# Patient Record
Sex: Male | Born: 1940 | Race: White | Hispanic: No | Marital: Married | Smoking: Current every day smoker
Health system: Southern US, Community
[De-identification: ages and names within clinical notes are randomized; demographics above are authoritative.]

## PROBLEM LIST (undated history)

## (undated) DIAGNOSIS — R0989 Other specified symptoms and signs involving the circulatory and respiratory systems: Secondary | ICD-10-CM

## (undated) DIAGNOSIS — R9430 Abnormal result of cardiovascular function study, unspecified: Secondary | ICD-10-CM

## (undated) DIAGNOSIS — Z951 Presence of aortocoronary bypass graft: Secondary | ICD-10-CM

## (undated) DIAGNOSIS — M5136 Other intervertebral disc degeneration, lumbar region: Secondary | ICD-10-CM

## (undated) DIAGNOSIS — M549 Dorsalgia, unspecified: Secondary | ICD-10-CM

## (undated) DIAGNOSIS — D696 Thrombocytopenia, unspecified: Secondary | ICD-10-CM

## (undated) DIAGNOSIS — I10 Essential (primary) hypertension: Secondary | ICD-10-CM

## (undated) DIAGNOSIS — M48061 Spinal stenosis, lumbar region without neurogenic claudication: Secondary | ICD-10-CM

## (undated) DIAGNOSIS — D472 Monoclonal gammopathy: Secondary | ICD-10-CM

## (undated) DIAGNOSIS — H269 Unspecified cataract: Secondary | ICD-10-CM

## (undated) DIAGNOSIS — I452 Bifascicular block: Secondary | ICD-10-CM

## (undated) DIAGNOSIS — I2510 Atherosclerotic heart disease of native coronary artery without angina pectoris: Secondary | ICD-10-CM

## (undated) DIAGNOSIS — I714 Abdominal aortic aneurysm, without rupture, unspecified: Secondary | ICD-10-CM

## (undated) DIAGNOSIS — K219 Gastro-esophageal reflux disease without esophagitis: Secondary | ICD-10-CM

## (undated) DIAGNOSIS — E785 Hyperlipidemia, unspecified: Secondary | ICD-10-CM

## (undated) DIAGNOSIS — Z87442 Personal history of urinary calculi: Secondary | ICD-10-CM

## (undated) DIAGNOSIS — I779 Disorder of arteries and arterioles, unspecified: Secondary | ICD-10-CM

## (undated) DIAGNOSIS — M51369 Other intervertebral disc degeneration, lumbar region without mention of lumbar back pain or lower extremity pain: Secondary | ICD-10-CM

## (undated) DIAGNOSIS — I219 Acute myocardial infarction, unspecified: Secondary | ICD-10-CM

## (undated) DIAGNOSIS — I739 Peripheral vascular disease, unspecified: Secondary | ICD-10-CM

## (undated) HISTORY — DX: Dorsalgia, unspecified: M54.9

## (undated) HISTORY — DX: Abdominal aortic aneurysm, without rupture: I71.4

## (undated) HISTORY — DX: Atherosclerotic heart disease of native coronary artery without angina pectoris: I25.10

## (undated) HISTORY — DX: Acute myocardial infarction, unspecified: I21.9

## (undated) HISTORY — PX: TONSILLECTOMY: SUR1361

## (undated) HISTORY — DX: Abdominal aortic aneurysm, without rupture, unspecified: I71.40

## (undated) HISTORY — PX: BACK SURGERY: SHX140

## (undated) HISTORY — PX: JOINT REPLACEMENT: SHX530

## (undated) HISTORY — DX: Unspecified cataract: H26.9

## (undated) HISTORY — DX: Thrombocytopenia, unspecified: D69.6

## (undated) HISTORY — DX: Essential (primary) hypertension: I10

## (undated) HISTORY — DX: Other intervertebral disc degeneration, lumbar region: M51.36

## (undated) HISTORY — DX: Bifascicular block: I45.2

## (undated) HISTORY — DX: Peripheral vascular disease, unspecified: I73.9

## (undated) HISTORY — DX: Other intervertebral disc degeneration, lumbar region without mention of lumbar back pain or lower extremity pain: M51.369

## (undated) HISTORY — DX: Presence of aortocoronary bypass graft: Z95.1

## (undated) HISTORY — DX: Other specified symptoms and signs involving the circulatory and respiratory systems: R09.89

## (undated) HISTORY — PX: COLONOSCOPY: SHX174

## (undated) HISTORY — DX: Disorder of arteries and arterioles, unspecified: I77.9

## (undated) HISTORY — DX: Hyperlipidemia, unspecified: E78.5

## (undated) HISTORY — DX: Abnormal result of cardiovascular function study, unspecified: R94.30

---

## 2001-07-19 ENCOUNTER — Encounter: Payer: Self-pay | Admitting: Orthopedic Surgery

## 2001-07-20 ENCOUNTER — Encounter: Payer: Self-pay | Admitting: Orthopedic Surgery

## 2001-07-20 ENCOUNTER — Ambulatory Visit (HOSPITAL_COMMUNITY): Admission: RE | Admit: 2001-07-20 | Discharge: 2001-07-20 | Payer: Self-pay | Admitting: Orthopedic Surgery

## 2002-12-08 HISTORY — PX: CORONARY ARTERY BYPASS GRAFT: SHX141

## 2003-04-04 ENCOUNTER — Encounter: Payer: Self-pay | Admitting: Cardiology

## 2003-04-05 ENCOUNTER — Inpatient Hospital Stay (HOSPITAL_COMMUNITY): Admission: AD | Admit: 2003-04-05 | Discharge: 2003-04-15 | Payer: Self-pay | Admitting: Cardiology

## 2003-04-10 ENCOUNTER — Encounter: Payer: Self-pay | Admitting: Cardiothoracic Surgery

## 2003-04-11 ENCOUNTER — Encounter: Payer: Self-pay | Admitting: Cardiothoracic Surgery

## 2003-04-12 ENCOUNTER — Encounter: Payer: Self-pay | Admitting: Cardiothoracic Surgery

## 2003-04-13 ENCOUNTER — Encounter: Payer: Self-pay | Admitting: Cardiothoracic Surgery

## 2003-10-09 HISTORY — PX: TOTAL HIP ARTHROPLASTY: SHX124

## 2003-10-16 ENCOUNTER — Inpatient Hospital Stay (HOSPITAL_COMMUNITY): Admission: RE | Admit: 2003-10-16 | Discharge: 2003-10-24 | Payer: Self-pay | Admitting: Orthopedic Surgery

## 2003-10-18 ENCOUNTER — Encounter: Payer: Self-pay | Admitting: Cardiology

## 2004-11-28 ENCOUNTER — Ambulatory Visit: Payer: Self-pay | Admitting: Cardiology

## 2005-03-06 ENCOUNTER — Ambulatory Visit: Payer: Self-pay | Admitting: Oncology

## 2005-03-10 ENCOUNTER — Ambulatory Visit (HOSPITAL_COMMUNITY): Admission: RE | Admit: 2005-03-10 | Discharge: 2005-03-10 | Payer: Self-pay | Admitting: Oncology

## 2005-06-30 ENCOUNTER — Ambulatory Visit: Payer: Self-pay | Admitting: Cardiology

## 2005-07-01 ENCOUNTER — Ambulatory Visit: Payer: Self-pay | Admitting: Cardiology

## 2005-07-02 ENCOUNTER — Ambulatory Visit: Payer: Self-pay | Admitting: Cardiology

## 2005-09-04 ENCOUNTER — Ambulatory Visit: Payer: Self-pay | Admitting: Oncology

## 2006-03-16 ENCOUNTER — Ambulatory Visit: Payer: Self-pay | Admitting: Oncology

## 2006-03-16 LAB — CBC WITH DIFFERENTIAL/PLATELET
Basophils Absolute: 0 10*3/uL (ref 0.0–0.1)
EOS%: 5.8 % (ref 0.0–7.0)
Eosinophils Absolute: 0.5 10*3/uL (ref 0.0–0.5)
HGB: 15 g/dL (ref 13.0–17.1)
MCH: 30.2 pg (ref 28.0–33.4)
MCV: 90.1 fL (ref 81.6–98.0)
MONO%: 5.1 % (ref 0.0–13.0)
NEUT#: 6.3 10*3/uL (ref 1.5–6.5)
RBC: 4.96 10*6/uL (ref 4.20–5.71)
RDW: 13.9 % (ref 11.2–14.6)
lymph#: 1.6 10*3/uL (ref 0.9–3.3)

## 2006-03-16 LAB — COMPREHENSIVE METABOLIC PANEL
ALT: 14 U/L (ref 0–40)
AST: 16 U/L (ref 0–37)
Albumin: 4.5 g/dL (ref 3.5–5.2)
Alkaline Phosphatase: 81 U/L (ref 39–117)
Calcium: 9.6 mg/dL (ref 8.4–10.5)
Chloride: 109 mEq/L (ref 96–112)
Potassium: 5 mEq/L (ref 3.5–5.3)
Sodium: 144 mEq/L (ref 135–145)
Total Protein: 7 g/dL (ref 6.0–8.3)

## 2006-04-22 ENCOUNTER — Ambulatory Visit: Payer: Self-pay | Admitting: Cardiology

## 2006-06-03 ENCOUNTER — Ambulatory Visit: Payer: Self-pay

## 2006-06-19 ENCOUNTER — Ambulatory Visit: Payer: Self-pay | Admitting: Cardiology

## 2006-06-26 ENCOUNTER — Ambulatory Visit: Payer: Self-pay | Admitting: Cardiology

## 2006-08-03 ENCOUNTER — Ambulatory Visit: Payer: Self-pay | Admitting: Cardiology

## 2006-09-15 ENCOUNTER — Ambulatory Visit: Payer: Self-pay | Admitting: Oncology

## 2006-12-14 ENCOUNTER — Ambulatory Visit: Payer: Self-pay | Admitting: Cardiology

## 2007-07-16 ENCOUNTER — Ambulatory Visit: Payer: Self-pay

## 2007-08-04 ENCOUNTER — Ambulatory Visit: Payer: Self-pay | Admitting: Cardiology

## 2007-08-05 ENCOUNTER — Ambulatory Visit: Payer: Self-pay | Admitting: Cardiology

## 2007-08-05 LAB — CONVERTED CEMR LAB
BUN: 13 mg/dL (ref 6–23)
Basophils Absolute: 0.1 10*3/uL (ref 0.0–0.1)
Basophils Relative: 0.7 % (ref 0.0–1.0)
CO2: 30 meq/L (ref 19–32)
Calcium: 9.4 mg/dL (ref 8.4–10.5)
Chloride: 112 meq/L (ref 96–112)
Cholesterol: 109 mg/dL (ref 0–200)
Creatinine, Ser: 0.8 mg/dL (ref 0.4–1.5)
Eosinophils Absolute: 0.9 10*3/uL — ABNORMAL HIGH (ref 0.0–0.6)
Eosinophils Relative: 9.6 % — ABNORMAL HIGH (ref 0.0–5.0)
GFR calc Af Amer: 124 mL/min
GFR calc non Af Amer: 102 mL/min
Glucose, Bld: 112 mg/dL — ABNORMAL HIGH (ref 70–99)
HCT: 42.2 % (ref 39.0–52.0)
HDL: 38.3 mg/dL — ABNORMAL LOW (ref >39.0)
Hemoglobin: 14.4 g/dL (ref 13.0–17.0)
LDL Cholesterol: 60 mg/dL (ref 0–99)
Lymphocytes Relative: 19.3 % (ref 12.0–46.0)
MCHC: 34.1 g/dL (ref 30.0–36.0)
MCV: 90.9 fL (ref 78.0–100.0)
Monocytes Absolute: 0.6 10*3/uL (ref 0.2–0.7)
Monocytes Relative: 6.2 % (ref 3.0–11.0)
Neutro Abs: 5.7 10*3/uL (ref 1.4–7.7)
Neutrophils Relative %: 64.2 % (ref 43.0–77.0)
Platelets: 67 10*3/uL — ABNORMAL LOW (ref 150–400)
Potassium: 5.1 meq/L (ref 3.5–5.1)
RBC: 4.64 M/uL (ref 4.22–5.81)
RDW: 12.7 % (ref 11.5–14.6)
Sodium: 146 meq/L — ABNORMAL HIGH (ref 135–145)
Total CHOL/HDL Ratio: 2.8
Triglycerides: 51 mg/dL (ref 0–149)
VLDL: 10 mg/dL (ref 0–40)
WBC: 9.1 10*3/uL (ref 4.5–10.5)

## 2007-08-27 ENCOUNTER — Ambulatory Visit: Payer: Self-pay | Admitting: Cardiology

## 2007-08-27 LAB — CONVERTED CEMR LAB
Basophils Absolute: 0.1 10*3/uL (ref 0.0–0.1)
Basophils Relative: 0.7 % (ref 0.0–1.0)
Eosinophils Absolute: 0.8 10*3/uL — ABNORMAL HIGH (ref 0.0–0.6)
Eosinophils Relative: 9.3 % — ABNORMAL HIGH (ref 0.0–5.0)
HCT: 42.7 % (ref 39.0–52.0)
Hemoglobin: 14.5 g/dL (ref 13.0–17.0)
Lymphocytes Relative: 23.4 % (ref 12.0–46.0)
MCHC: 34 g/dL (ref 30.0–36.0)
MCV: 91.8 fL (ref 78.0–100.0)
Monocytes Absolute: 0.8 10*3/uL — ABNORMAL HIGH (ref 0.2–0.7)
Monocytes Relative: 9.3 % (ref 3.0–11.0)
Neutro Abs: 4.8 10*3/uL (ref 1.4–7.7)
Neutrophils Relative %: 57.3 % (ref 43.0–77.0)
Platelets: 56 10*3/uL — ABNORMAL LOW (ref 150–400)
RBC: 4.65 M/uL (ref 4.22–5.81)
RDW: 12.8 % (ref 11.5–14.6)
WBC: 8.5 10*3/uL (ref 4.5–10.5)

## 2007-09-01 ENCOUNTER — Ambulatory Visit: Payer: Self-pay | Admitting: Oncology

## 2007-09-03 LAB — COMPREHENSIVE METABOLIC PANEL
AST: 13 U/L (ref 0–37)
BUN: 18 mg/dL (ref 6–23)
Calcium: 9 mg/dL (ref 8.4–10.5)
Chloride: 107 mEq/L (ref 96–112)
Creatinine, Ser: 0.83 mg/dL (ref 0.40–1.50)

## 2007-09-03 LAB — CBC WITH DIFFERENTIAL/PLATELET
Basophils Absolute: 0 10*3/uL (ref 0.0–0.1)
EOS%: 7.6 % — ABNORMAL HIGH (ref 0.0–7.0)
HCT: 41.9 % (ref 38.7–49.9)
HGB: 14.7 g/dL (ref 13.0–17.1)
MCH: 31.4 pg (ref 28.0–33.4)
MCV: 89.2 fL (ref 81.6–98.0)
MONO%: 5.6 % (ref 0.0–13.0)
NEUT%: 66.8 % (ref 40.0–75.0)
lymph#: 1.8 10*3/uL (ref 0.9–3.3)

## 2007-09-07 LAB — IMMUNOFIXATION ELECTROPHORESIS
IgA: 113 mg/dL (ref 68–378)
IgG (Immunoglobin G), Serum: 1300 mg/dL (ref 694–1618)
Total Protein, Serum Electrophoresis: 6.9 g/dL (ref 6.0–8.3)

## 2008-03-01 ENCOUNTER — Ambulatory Visit: Payer: Self-pay | Admitting: Oncology

## 2008-03-03 ENCOUNTER — Encounter: Payer: Self-pay | Admitting: Internal Medicine

## 2008-04-24 ENCOUNTER — Ambulatory Visit: Payer: Self-pay | Admitting: Oncology

## 2008-06-21 ENCOUNTER — Ambulatory Visit: Payer: Self-pay | Admitting: Oncology

## 2008-06-23 LAB — CBC WITH DIFFERENTIAL/PLATELET
BASO%: 0.5 % (ref 0.0–2.0)
Basophils Absolute: 0 10*3/uL (ref 0.0–0.1)
EOS%: 9.5 % — ABNORMAL HIGH (ref 0.0–7.0)
MCH: 31.2 pg (ref 28.0–33.4)
MCHC: 34.8 g/dL (ref 32.0–35.9)
MCV: 89.8 fL (ref 81.6–98.0)
MONO%: 5.6 % (ref 0.0–13.0)
RDW: 13.4 % (ref 11.2–14.6)
lymph#: 2.3 10*3/uL (ref 0.9–3.3)

## 2008-08-04 ENCOUNTER — Ambulatory Visit: Payer: Self-pay | Admitting: Cardiology

## 2008-08-08 ENCOUNTER — Ambulatory Visit: Payer: Self-pay | Admitting: Cardiology

## 2008-08-08 ENCOUNTER — Ambulatory Visit: Payer: Self-pay

## 2008-08-08 ENCOUNTER — Encounter: Payer: Self-pay | Admitting: Internal Medicine

## 2008-08-08 LAB — CONVERTED CEMR LAB
Cholesterol: 131 mg/dL (ref 0–200)
HDL: 33.2 mg/dL — ABNORMAL LOW (ref >39.0)
LDL Cholesterol: 65 mg/dL (ref 0–99)
Total CHOL/HDL Ratio: 3.9
Triglycerides: 165 mg/dL — ABNORMAL HIGH (ref 0–149)
VLDL: 33 mg/dL (ref 0–40)

## 2008-08-15 ENCOUNTER — Ambulatory Visit: Payer: Self-pay | Admitting: Oncology

## 2008-09-04 ENCOUNTER — Encounter: Payer: Self-pay | Admitting: Internal Medicine

## 2008-09-04 LAB — CBC WITH DIFFERENTIAL/PLATELET
BASO%: 0.3 % (ref 0.0–2.0)
EOS%: 7.2 % — ABNORMAL HIGH (ref 0.0–7.0)
MCH: 31.4 pg (ref 28.0–33.4)
MCHC: 34.3 g/dL (ref 32.0–35.9)
NEUT%: 70.6 % (ref 40.0–75.0)
RDW: 13.4 % (ref 11.2–14.6)
lymph#: 1.5 10*3/uL (ref 0.9–3.3)

## 2008-09-04 LAB — COMPREHENSIVE METABOLIC PANEL
ALT: 13 U/L (ref 0–53)
AST: 13 U/L (ref 0–37)
Alkaline Phosphatase: 73 U/L (ref 39–117)
Calcium: 9 mg/dL (ref 8.4–10.5)
Chloride: 106 mEq/L (ref 96–112)
Creatinine, Ser: 0.85 mg/dL (ref 0.40–1.50)
Potassium: 3.6 mEq/L (ref 3.5–5.3)

## 2008-09-04 LAB — LACTATE DEHYDROGENASE: LDH: 112 U/L (ref 94–250)

## 2008-09-25 LAB — CBC WITH DIFFERENTIAL/PLATELET
BASO%: 0.4 % (ref 0.0–2.0)
EOS%: 9.7 % — ABNORMAL HIGH (ref 0.0–7.0)
HCT: 44.8 % (ref 38.7–49.9)
LYMPH%: 21 % (ref 14.0–48.0)
MCH: 31.2 pg (ref 28.0–33.4)
MCHC: 34.4 g/dL (ref 32.0–35.9)
NEUT%: 62.4 % (ref 40.0–75.0)
Platelets: 59 10*3/uL — ABNORMAL LOW (ref 145–400)

## 2008-10-18 ENCOUNTER — Ambulatory Visit: Payer: Self-pay | Admitting: Oncology

## 2008-11-28 ENCOUNTER — Ambulatory Visit: Payer: Self-pay | Admitting: Oncology

## 2008-11-29 ENCOUNTER — Encounter: Payer: Self-pay | Admitting: Internal Medicine

## 2008-11-29 LAB — CBC WITH DIFFERENTIAL/PLATELET
BASO%: 0.5 % (ref 0.0–2.0)
Eosinophils Absolute: 0.8 10*3/uL — ABNORMAL HIGH (ref 0.0–0.5)
HCT: 41.7 % (ref 38.7–49.9)
HGB: 14.2 g/dL (ref 13.0–17.1)
MCHC: 34 g/dL (ref 32.0–35.9)
MONO#: 0.4 10*3/uL (ref 0.1–0.9)
NEUT#: 5.2 10*3/uL (ref 1.5–6.5)
NEUT%: 65.4 % (ref 40.0–75.0)
WBC: 7.9 10*3/uL (ref 4.0–10.0)
lymph#: 1.5 10*3/uL (ref 0.9–3.3)

## 2008-11-29 LAB — COMPREHENSIVE METABOLIC PANEL
ALT: 12 U/L (ref 0–53)
CO2: 21 mEq/L (ref 19–32)
Calcium: 8.7 mg/dL (ref 8.4–10.5)
Chloride: 110 mEq/L (ref 96–112)
Creatinine, Ser: 0.81 mg/dL (ref 0.40–1.50)
Sodium: 142 mEq/L (ref 135–145)
Total Protein: 6.8 g/dL (ref 6.0–8.3)

## 2008-11-29 LAB — LACTATE DEHYDROGENASE: LDH: 111 U/L (ref 94–250)

## 2009-02-23 ENCOUNTER — Ambulatory Visit: Payer: Self-pay | Admitting: Oncology

## 2009-02-27 LAB — CBC WITH DIFFERENTIAL/PLATELET
BASO%: 0.4 % (ref 0.0–2.0)
LYMPH%: 17.7 % (ref 14.0–49.0)
MCHC: 33.8 g/dL (ref 32.0–36.0)
MONO#: 0.5 10*3/uL (ref 0.1–0.9)
NEUT#: 5.8 10*3/uL (ref 1.5–6.5)
RBC: 4.73 10*6/uL (ref 4.20–5.82)
RDW: 13.1 % (ref 11.0–14.6)
WBC: 8.7 10*3/uL (ref 4.0–10.3)
lymph#: 1.5 10*3/uL (ref 0.9–3.3)

## 2009-05-24 ENCOUNTER — Ambulatory Visit: Payer: Self-pay | Admitting: Oncology

## 2009-06-19 ENCOUNTER — Encounter: Payer: Self-pay | Admitting: Internal Medicine

## 2009-06-19 LAB — IGG, IGA, IGM
IgA: 122 mg/dL (ref 68–378)
IgG (Immunoglobin G), Serum: 1360 mg/dL (ref 694–1618)
IgM, Serum: 69 mg/dL (ref 60–263)

## 2009-06-19 LAB — COMPREHENSIVE METABOLIC PANEL
Alkaline Phosphatase: 71 U/L (ref 39–117)
CO2: 22 mEq/L (ref 19–32)
Creatinine, Ser: 0.98 mg/dL (ref 0.40–1.50)
Glucose, Bld: 117 mg/dL — ABNORMAL HIGH (ref 70–99)
Total Bilirubin: 0.4 mg/dL (ref 0.3–1.2)

## 2009-06-19 LAB — CBC WITH DIFFERENTIAL/PLATELET
Eosinophils Absolute: 0.9 10*3/uL — ABNORMAL HIGH (ref 0.0–0.5)
HCT: 44.8 % (ref 38.4–49.9)
LYMPH%: 23.1 % (ref 14.0–49.0)
MCHC: 34.1 g/dL (ref 32.0–36.0)
MCV: 91.5 fL (ref 79.3–98.0)
MONO#: 0.4 10*3/uL (ref 0.1–0.9)
MONO%: 4.9 % (ref 0.0–14.0)
NEUT#: 5.2 10*3/uL (ref 1.5–6.5)
NEUT%: 60.8 % (ref 39.0–75.0)
Platelets: 80 10*3/uL — ABNORMAL LOW (ref 140–400)
RBC: 4.89 10*6/uL (ref 4.20–5.82)
WBC: 8.6 10*3/uL (ref 4.0–10.3)

## 2009-06-19 LAB — LACTATE DEHYDROGENASE: LDH: 118 U/L (ref 94–250)

## 2009-07-25 ENCOUNTER — Encounter (INDEPENDENT_AMBULATORY_CARE_PROVIDER_SITE_OTHER): Payer: Self-pay | Admitting: *Deleted

## 2009-09-13 ENCOUNTER — Ambulatory Visit: Payer: Self-pay | Admitting: Oncology

## 2009-10-19 ENCOUNTER — Encounter: Payer: Self-pay | Admitting: Cardiology

## 2009-10-22 ENCOUNTER — Ambulatory Visit: Payer: Self-pay | Admitting: Cardiology

## 2009-11-19 ENCOUNTER — Ambulatory Visit: Payer: Self-pay | Admitting: Cardiology

## 2009-11-20 ENCOUNTER — Encounter: Payer: Self-pay | Admitting: Cardiology

## 2009-11-20 LAB — CONVERTED CEMR LAB
ALT: 21 units/L (ref 0–53)
AST: 19 units/L (ref 0–37)
Albumin: 3.7 g/dL (ref 3.5–5.2)
Alkaline Phosphatase: 70 units/L (ref 39–117)
Bilirubin, Direct: 0 mg/dL (ref 0.0–0.3)
Cholesterol: 133 mg/dL (ref 0–200)
HDL: 42.7 mg/dL (ref >39.00)
LDL Cholesterol: 74 mg/dL (ref 0–99)
Total Bilirubin: 0.8 mg/dL (ref 0.3–1.2)
Total CHOL/HDL Ratio: 3
Total Protein: 6.6 g/dL (ref 6.0–8.3)
Triglycerides: 80 mg/dL (ref 0.0–149.0)
VLDL: 16 mg/dL (ref 0.0–40.0)

## 2009-12-13 ENCOUNTER — Ambulatory Visit: Payer: Self-pay | Admitting: Oncology

## 2009-12-17 ENCOUNTER — Encounter: Payer: Self-pay | Admitting: Internal Medicine

## 2009-12-17 LAB — CBC WITH DIFFERENTIAL/PLATELET
BASO%: 0.4 % (ref 0.0–2.0)
Basophils Absolute: 0 10*3/uL (ref 0.0–0.1)
EOS%: 9.8 % — ABNORMAL HIGH (ref 0.0–7.0)
Eosinophils Absolute: 0.8 10*3/uL — ABNORMAL HIGH (ref 0.0–0.5)
HCT: 42.2 % (ref 38.4–49.9)
HGB: 14.5 g/dL (ref 13.0–17.1)
LYMPH%: 25.2 % (ref 14.0–49.0)
MCH: 31.6 pg (ref 27.2–33.4)
MCHC: 34.4 g/dL (ref 32.0–36.0)
MCV: 92 fL (ref 79.3–98.0)
MONO#: 0.5 10*3/uL (ref 0.1–0.9)
MONO%: 6.6 % (ref 0.0–14.0)
NEUT#: 4.7 10*3/uL (ref 1.5–6.5)
NEUT%: 58 % (ref 39.0–75.0)
Platelets: 69 10*3/uL — ABNORMAL LOW (ref 140–400)
RBC: 4.59 10*6/uL (ref 4.20–5.82)
RDW: 13.4 % (ref 11.0–14.6)
WBC: 8 10*3/uL (ref 4.0–10.3)
lymph#: 2 10*3/uL (ref 0.9–3.3)

## 2009-12-17 LAB — COMPREHENSIVE METABOLIC PANEL
ALT: 16 U/L (ref 0–53)
AST: 14 U/L (ref 0–37)
Albumin: 4.2 g/dL (ref 3.5–5.2)
Alkaline Phosphatase: 70 U/L (ref 39–117)
BUN: 14 mg/dL (ref 6–23)
CO2: 26 mEq/L (ref 19–32)
Calcium: 9.1 mg/dL (ref 8.4–10.5)
Chloride: 107 mEq/L (ref 96–112)
Creatinine, Ser: 0.88 mg/dL (ref 0.40–1.50)
Glucose, Bld: 110 mg/dL — ABNORMAL HIGH (ref 70–99)
Potassium: 4.1 mEq/L (ref 3.5–5.3)
Sodium: 142 mEq/L (ref 135–145)
Total Bilirubin: 0.4 mg/dL (ref 0.3–1.2)
Total Protein: 6.8 g/dL (ref 6.0–8.3)

## 2009-12-17 LAB — IGG, IGA, IGM
IgA: 116 mg/dL (ref 68–378)
IgG (Immunoglobin G), Serum: 1260 mg/dL (ref 694–1618)
IgM, Serum: 68 mg/dL (ref 60–263)

## 2009-12-17 LAB — LACTATE DEHYDROGENASE: LDH: 118 U/L (ref 94–250)

## 2009-12-31 ENCOUNTER — Telehealth: Payer: Self-pay | Admitting: Cardiology

## 2010-01-01 ENCOUNTER — Ambulatory Visit: Payer: Self-pay | Admitting: Internal Medicine

## 2010-01-01 DIAGNOSIS — R1013 Epigastric pain: Secondary | ICD-10-CM

## 2010-01-01 DIAGNOSIS — F17200 Nicotine dependence, unspecified, uncomplicated: Secondary | ICD-10-CM | POA: Insufficient documentation

## 2010-01-01 DIAGNOSIS — K3189 Other diseases of stomach and duodenum: Secondary | ICD-10-CM | POA: Insufficient documentation

## 2010-01-02 ENCOUNTER — Telehealth (INDEPENDENT_AMBULATORY_CARE_PROVIDER_SITE_OTHER): Payer: Self-pay | Admitting: *Deleted

## 2010-01-08 ENCOUNTER — Encounter (INDEPENDENT_AMBULATORY_CARE_PROVIDER_SITE_OTHER): Payer: Self-pay | Admitting: *Deleted

## 2010-01-09 ENCOUNTER — Ambulatory Visit: Payer: Self-pay | Admitting: Gastroenterology

## 2010-01-16 ENCOUNTER — Telehealth (INDEPENDENT_AMBULATORY_CARE_PROVIDER_SITE_OTHER): Payer: Self-pay | Admitting: *Deleted

## 2010-01-22 ENCOUNTER — Ambulatory Visit: Payer: Self-pay | Admitting: Gastroenterology

## 2010-01-24 ENCOUNTER — Encounter: Payer: Self-pay | Admitting: Gastroenterology

## 2010-03-14 ENCOUNTER — Ambulatory Visit: Payer: Self-pay | Admitting: Oncology

## 2010-03-18 LAB — CBC WITH DIFFERENTIAL/PLATELET
BASO%: 0.3 % (ref 0.0–2.0)
Basophils Absolute: 0 10*3/uL (ref 0.0–0.1)
EOS%: 9.2 % — ABNORMAL HIGH (ref 0.0–7.0)
Eosinophils Absolute: 0.9 10*3/uL — ABNORMAL HIGH (ref 0.0–0.5)
HCT: 42.6 % (ref 38.4–49.9)
HGB: 14.6 g/dL (ref 13.0–17.1)
LYMPH%: 21.4 % (ref 14.0–49.0)
MCH: 30.9 pg (ref 27.2–33.4)
MCHC: 34.3 g/dL (ref 32.0–36.0)
MCV: 90.1 fL (ref 79.3–98.0)
MONO#: 0.6 10*3/uL (ref 0.1–0.9)
MONO%: 6.6 % (ref 0.0–14.0)
NEUT#: 5.9 10*3/uL (ref 1.5–6.5)
NEUT%: 62.5 % (ref 39.0–75.0)
Platelets: 73 10*3/uL — ABNORMAL LOW (ref 140–400)
RBC: 4.73 10*6/uL (ref 4.20–5.82)
RDW: 13.1 % (ref 11.0–14.6)
WBC: 9.5 10*3/uL (ref 4.0–10.3)
lymph#: 2 10*3/uL (ref 0.9–3.3)

## 2010-05-08 ENCOUNTER — Encounter: Payer: Self-pay | Admitting: Cardiology

## 2010-05-17 ENCOUNTER — Ambulatory Visit: Payer: Self-pay | Admitting: Cardiology

## 2010-05-22 ENCOUNTER — Encounter: Payer: Self-pay | Admitting: Cardiology

## 2010-05-22 LAB — CONVERTED CEMR LAB
ALT: 18 units/L (ref 0–53)
AST: 18 units/L (ref 0–37)
Albumin: 4.1 g/dL (ref 3.5–5.2)
Alkaline Phosphatase: 69 units/L (ref 39–117)
Bilirubin, Direct: 0.2 mg/dL (ref 0.0–0.3)
Cholesterol: 157 mg/dL (ref 0–200)
HDL: 46 mg/dL (ref >39.00)
LDL Cholesterol: 93 mg/dL (ref 0–99)
Total Bilirubin: 0.5 mg/dL (ref 0.3–1.2)
Total CHOL/HDL Ratio: 3
Total Protein: 7 g/dL (ref 6.0–8.3)
Triglycerides: 90 mg/dL (ref 0.0–149.0)
VLDL: 18 mg/dL (ref 0.0–40.0)

## 2010-06-13 ENCOUNTER — Ambulatory Visit: Payer: Self-pay | Admitting: Oncology

## 2010-06-17 ENCOUNTER — Encounter: Payer: Self-pay | Admitting: Internal Medicine

## 2010-06-17 LAB — CBC WITH DIFFERENTIAL/PLATELET
BASO%: 0.3 % (ref 0.0–2.0)
Basophils Absolute: 0 10*3/uL (ref 0.0–0.1)
EOS%: 4.9 % (ref 0.0–7.0)
Eosinophils Absolute: 0.6 10*3/uL — ABNORMAL HIGH (ref 0.0–0.5)
HCT: 44.4 % (ref 38.4–49.9)
HGB: 15 g/dL (ref 13.0–17.1)
LYMPH%: 11.7 % — ABNORMAL LOW (ref 14.0–49.0)
MCH: 30.5 pg (ref 27.2–33.4)
MCHC: 33.8 g/dL (ref 32.0–36.0)
MCV: 90.4 fL (ref 79.3–98.0)
MONO#: 0.8 10*3/uL (ref 0.1–0.9)
MONO%: 6.9 % (ref 0.0–14.0)
NEUT#: 9 10*3/uL — ABNORMAL HIGH (ref 1.5–6.5)
NEUT%: 76.2 % — ABNORMAL HIGH (ref 39.0–75.0)
Platelets: 20 10*3/uL — ABNORMAL LOW (ref 140–400)
RBC: 4.91 10*6/uL (ref 4.20–5.82)
RDW: 13.3 % (ref 11.0–14.6)
WBC: 11.8 10*3/uL — ABNORMAL HIGH (ref 4.0–10.3)
lymph#: 1.4 10*3/uL (ref 0.9–3.3)
nRBC: 0 % (ref 0–0)

## 2010-06-17 LAB — IGG, IGA, IGM
IgA: 107 mg/dL (ref 68–378)
IgG (Immunoglobin G), Serum: 1180 mg/dL (ref 694–1618)
IgM, Serum: 64 mg/dL (ref 60–263)

## 2010-06-17 LAB — COMPREHENSIVE METABOLIC PANEL
ALT: 13 U/L (ref 0–53)
AST: 15 U/L (ref 0–37)
Albumin: 4.3 g/dL (ref 3.5–5.2)
Alkaline Phosphatase: 76 U/L (ref 39–117)
BUN: 17 mg/dL (ref 6–23)
CO2: 25 mEq/L (ref 19–32)
Calcium: 9.3 mg/dL (ref 8.4–10.5)
Chloride: 104 mEq/L (ref 96–112)
Creatinine, Ser: 0.85 mg/dL (ref 0.40–1.50)
Glucose, Bld: 134 mg/dL — ABNORMAL HIGH (ref 70–99)
Potassium: 4.2 mEq/L (ref 3.5–5.3)
Sodium: 138 mEq/L (ref 135–145)
Total Bilirubin: 0.4 mg/dL (ref 0.3–1.2)
Total Protein: 7.1 g/dL (ref 6.0–8.3)

## 2010-06-17 LAB — MORPHOLOGY
PLT EST: DECREASED
RBC Comments: NORMAL

## 2010-06-17 LAB — LACTATE DEHYDROGENASE: LDH: 112 U/L (ref 94–250)

## 2010-06-24 LAB — CBC WITH DIFFERENTIAL/PLATELET
BASO%: 0.5 % (ref 0.0–2.0)
Basophils Absolute: 0.1 10*3/uL (ref 0.0–0.1)
EOS%: 10.9 % — ABNORMAL HIGH (ref 0.0–7.0)
Eosinophils Absolute: 1.1 10*3/uL — ABNORMAL HIGH (ref 0.0–0.5)
HCT: 43 % (ref 38.4–49.9)
HGB: 14.4 g/dL (ref 13.0–17.1)
LYMPH%: 24 % (ref 14.0–49.0)
MCH: 30.2 pg (ref 27.2–33.4)
MCHC: 33.5 g/dL (ref 32.0–36.0)
MCV: 90.1 fL (ref 79.3–98.0)
MONO#: 0.7 10*3/uL (ref 0.1–0.9)
MONO%: 7.3 % (ref 0.0–14.0)
NEUT#: 5.7 10*3/uL (ref 1.5–6.5)
NEUT%: 57.3 % (ref 39.0–75.0)
Platelets: 110 10*3/uL — ABNORMAL LOW (ref 140–400)
RBC: 4.77 10*6/uL (ref 4.20–5.82)
RDW: 13.2 % (ref 11.0–14.6)
WBC: 9.9 10*3/uL (ref 4.0–10.3)
lymph#: 2.4 10*3/uL (ref 0.9–3.3)
nRBC: 0 % (ref 0–0)

## 2010-07-12 ENCOUNTER — Emergency Department (HOSPITAL_COMMUNITY): Admission: EM | Admit: 2010-07-12 | Discharge: 2010-07-12 | Payer: Self-pay | Admitting: Emergency Medicine

## 2010-07-25 ENCOUNTER — Encounter: Payer: Self-pay | Admitting: Internal Medicine

## 2010-07-29 ENCOUNTER — Ambulatory Visit: Payer: Self-pay | Admitting: Oncology

## 2010-07-29 LAB — CBC WITH DIFFERENTIAL/PLATELET
BASO%: 0.4 % (ref 0.0–2.0)
Basophils Absolute: 0 10*3/uL (ref 0.0–0.1)
EOS%: 6.5 % (ref 0.0–7.0)
Eosinophils Absolute: 0.6 10*3/uL — ABNORMAL HIGH (ref 0.0–0.5)
HCT: 42.7 % (ref 38.4–49.9)
HGB: 14.9 g/dL (ref 13.0–17.1)
LYMPH%: 17.8 % (ref 14.0–49.0)
MCH: 31.3 pg (ref 27.2–33.4)
MCHC: 34.8 g/dL (ref 32.0–36.0)
MCV: 89.9 fL (ref 79.3–98.0)
MONO#: 0.5 10*3/uL (ref 0.1–0.9)
MONO%: 5.7 % (ref 0.0–14.0)
NEUT#: 6.5 10*3/uL (ref 1.5–6.5)
NEUT%: 69.6 % (ref 39.0–75.0)
Platelets: 80 10*3/uL — ABNORMAL LOW (ref 140–400)
RBC: 4.75 10*6/uL (ref 4.20–5.82)
RDW: 13.6 % (ref 11.0–14.6)
WBC: 9.4 10*3/uL (ref 4.0–10.3)
lymph#: 1.7 10*3/uL (ref 0.9–3.3)

## 2010-08-02 ENCOUNTER — Encounter: Admission: RE | Admit: 2010-08-02 | Discharge: 2010-08-02 | Payer: Self-pay | Admitting: Orthopedic Surgery

## 2010-08-22 ENCOUNTER — Encounter: Payer: Self-pay | Admitting: Internal Medicine

## 2010-09-13 ENCOUNTER — Ambulatory Visit: Payer: Self-pay | Admitting: Oncology

## 2010-09-17 LAB — CBC WITH DIFFERENTIAL/PLATELET
BASO%: 0.7 % (ref 0.0–2.0)
Basophils Absolute: 0.1 10*3/uL (ref 0.0–0.1)
EOS%: 10.5 % — ABNORMAL HIGH (ref 0.0–7.0)
Eosinophils Absolute: 0.9 10*3/uL — ABNORMAL HIGH (ref 0.0–0.5)
HCT: 41.7 % (ref 38.4–49.9)
HGB: 14.1 g/dL (ref 13.0–17.1)
LYMPH%: 24.7 % (ref 14.0–49.0)
MCH: 30.5 pg (ref 27.2–33.4)
MCHC: 33.8 g/dL (ref 32.0–36.0)
MCV: 90.1 fL (ref 79.3–98.0)
MONO#: 0.7 10*3/uL (ref 0.1–0.9)
MONO%: 8.3 % (ref 0.0–14.0)
NEUT#: 4.5 10*3/uL (ref 1.5–6.5)
NEUT%: 55.8 % (ref 39.0–75.0)
Platelets: 53 10*3/uL — ABNORMAL LOW (ref 140–400)
RBC: 4.63 10*6/uL (ref 4.20–5.82)
RDW: 13.9 % (ref 11.0–14.6)
WBC: 8.1 10*3/uL (ref 4.0–10.3)
lymph#: 2 10*3/uL (ref 0.9–3.3)
nRBC: 0 % (ref 0–0)

## 2010-09-19 ENCOUNTER — Encounter: Payer: Self-pay | Admitting: Internal Medicine

## 2010-10-10 ENCOUNTER — Ambulatory Visit: Payer: Self-pay | Admitting: Cardiology

## 2010-10-14 ENCOUNTER — Telehealth (INDEPENDENT_AMBULATORY_CARE_PROVIDER_SITE_OTHER): Payer: Self-pay | Admitting: *Deleted

## 2010-10-15 ENCOUNTER — Encounter (HOSPITAL_COMMUNITY)
Admission: RE | Admit: 2010-10-15 | Discharge: 2010-12-07 | Payer: Self-pay | Source: Home / Self Care | Attending: Cardiology | Admitting: Cardiology

## 2010-10-15 ENCOUNTER — Encounter: Payer: Self-pay | Admitting: Cardiology

## 2010-10-15 ENCOUNTER — Ambulatory Visit: Payer: Self-pay | Admitting: Cardiology

## 2010-10-15 ENCOUNTER — Encounter (INDEPENDENT_AMBULATORY_CARE_PROVIDER_SITE_OTHER): Payer: Self-pay | Admitting: *Deleted

## 2010-10-15 ENCOUNTER — Ambulatory Visit: Payer: Self-pay

## 2010-10-17 ENCOUNTER — Encounter: Payer: Self-pay | Admitting: Internal Medicine

## 2010-10-17 ENCOUNTER — Encounter: Payer: Self-pay | Admitting: Cardiology

## 2010-12-17 ENCOUNTER — Ambulatory Visit: Payer: Self-pay | Admitting: Oncology

## 2010-12-19 ENCOUNTER — Encounter: Payer: Self-pay | Admitting: Internal Medicine

## 2010-12-19 ENCOUNTER — Encounter: Payer: Self-pay | Admitting: Cardiology

## 2010-12-19 LAB — CBC WITH DIFFERENTIAL/PLATELET
BASO%: 0.5 % (ref 0.0–2.0)
Basophils Absolute: 0 10*3/uL (ref 0.0–0.1)
EOS%: 6.9 % (ref 0.0–7.0)
Eosinophils Absolute: 0.6 10*3/uL — ABNORMAL HIGH (ref 0.0–0.5)
HCT: 43.1 % (ref 38.4–49.9)
HGB: 14.8 g/dL (ref 13.0–17.1)
LYMPH%: 15.6 % (ref 14.0–49.0)
MCH: 30.9 pg (ref 27.2–33.4)
MCHC: 34.2 g/dL (ref 32.0–36.0)
MCV: 90.1 fL (ref 79.3–98.0)
MONO#: 0.5 10*3/uL (ref 0.1–0.9)
MONO%: 4.9 % (ref 0.0–14.0)
NEUT#: 6.7 10*3/uL — ABNORMAL HIGH (ref 1.5–6.5)
NEUT%: 72.1 % (ref 39.0–75.0)
Platelets: 75 10*3/uL — ABNORMAL LOW (ref 140–400)
RBC: 4.78 10*6/uL (ref 4.20–5.82)
RDW: 13.1 % (ref 11.0–14.6)
WBC: 9.3 10*3/uL (ref 4.0–10.3)
lymph#: 1.5 10*3/uL (ref 0.9–3.3)

## 2010-12-19 LAB — COMPREHENSIVE METABOLIC PANEL
ALT: 12 U/L (ref 0–53)
AST: 16 U/L (ref 0–37)
Albumin: 4.2 g/dL (ref 3.5–5.2)
Alkaline Phosphatase: 70 U/L (ref 39–117)
BUN: 16 mg/dL (ref 6–23)
CO2: 25 mEq/L (ref 19–32)
Calcium: 9.2 mg/dL (ref 8.4–10.5)
Chloride: 109 mEq/L (ref 96–112)
Creatinine, Ser: 0.79 mg/dL (ref 0.40–1.50)
Glucose, Bld: 87 mg/dL (ref 70–99)
Potassium: 4.2 mEq/L (ref 3.5–5.3)
Sodium: 142 mEq/L (ref 135–145)
Total Bilirubin: 0.3 mg/dL (ref 0.3–1.2)
Total Protein: 7.2 g/dL (ref 6.0–8.3)

## 2010-12-19 LAB — LACTATE DEHYDROGENASE: LDH: 108 U/L (ref 94–250)

## 2010-12-19 LAB — IGG, IGA, IGM
IgA: 106 mg/dL (ref 68–378)
IgG (Immunoglobin G), Serum: 1180 mg/dL (ref 694–1618)
IgM, Serum: 65 mg/dL (ref 60–263)

## 2010-12-30 ENCOUNTER — Encounter: Payer: Self-pay | Admitting: Internal Medicine

## 2011-01-09 NOTE — Assessment & Plan Note (Signed)
Summary: 1 YR/DMP  Medications Added PRILOSEC 20 MG CPDR (OMEPRAZOLE) once daily TRAMADOL HCL 50 MG TABS (TRAMADOL HCL) as needed      Allergies Added:   Visit Type:  Follow-up Referring Provider:  Murinson Primary Provider:  NORINS  CC:  CAD.  History of Present Illness: The patient is seen for follow up of coronary artery disease.  I saw him last November, 2010.  He underwent CABG in 2004.  His last exercise test was in 2009.  There were some old scar.  Ejection fraction was 55%.  There was no significant ischemia.  He has not been having any significant pain.  He does have some back pain.  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  Current Medications (verified): 1)  Simvastatin 40 Mg Tabs (Simvastatin) .... Take One Tablet By Mouth Daily At Bedtime 2)  Lisinopril 10 Mg Tabs (Lisinopril) .... Take One Tablet By Mouth Daily 3)  Metoprolol Tartrate 50 Mg Tabs (Metoprolol Tartrate) .... Take One Tablet By Mouth Twice A Day 4)  Aspirin 81 Mg Tbec (Aspirin) .... Take One Tablet By Mouth Daily 5)  Stool Softener Caps (Docusate Sodium) .... As Needed 6)  Coricidin D Cold/flu/sinus 2-5-325 Mg Tabs (Chlorphen-Phenyleph-Apap) .... As Needed 7)  Centrum Silver  Tabs (Multiple Vitamins-Minerals) .Marland Kitchen.. 1 Tab Daily 8)  Prilosec 20 Mg Cpdr (Omeprazole) .... Once Daily 9)  Tramadol Hcl 50 Mg Tabs (Tramadol Hcl) .... As Needed  Allergies (verified): 1)  ! * Pcn  Past History:  Past Medical History: RBBB  .. 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 /  55-65%... echo... 2004  /   55%... nuclear study... September, 2009 CAD (ICD-414.00)...nuclear stress... September, 2009... no ischemia... EF 55%... old scar.  Apical septum.Marland Kitchen decreased motion apical anterior Prostatitis.Marland Kitchen by history Penicillin allergy... Hip surgery... Back pain     November, 2011  Review of Systems       Patient denies fever, chills, headache, sweats, rash, change in vision, change in hearing, chest pain, cough, nausea vomiting, urinary symptoms.  All of the systems are reviewed and are negative.  Vital Signs:  Patient profile:   70 year old male Height:      70 inches Weight:      174 pounds BMI:     25.06 Pulse rate:   84 / minute BP sitting:   134 / 78  (left arm) Cuff size:   regular  Vitals Entered By: Hardin Negus, RMA (October 10, 2010 3:37 PM)  Physical Exam  General:  Patient is stable. Head:  it is atraumatic. Eyes:  no xanthelasma. Neck:  no jugular venous distention. Chest Wall:  no chest wall tenderness. Lungs:  lungs are clear.  Respiratory effort is nonlabored. Heart:  cardiac exam reveals S1-S2.  No clicks or significant murmurs. Abdomen:  abdomen is soft. Msk:  no musculoskeletal deformities.  The patient does have back pain and has to move slowly. Extremities:  no peripheral edema. Skin:  no skin rashes. Psych:  patient is oriented to person time and place her affect is normal.   Impression & Recommendations:  Problem # 1:  TOBACCO ABUSE (ICD-305.1) The patient continues to smoke a small amount.  I've advised him to stop.  Problem # 2:  HYPERTENSION (ICD-401.9)  His updated medication list for this problem includes:    Lisinopril 10 Mg Tabs (Lisinopril) .Marland Kitchen... Take one tablet by mouth daily  Metoprolol Tartrate 50 Mg Tabs (Metoprolol tartrate) .Marland Kitchen... Take one tablet by mouth twice a day    Aspirin 81 Mg Tbec (Aspirin) .Marland Kitchen... Take one tablet by mouth daily Blood pressure is well-controlled.  No change in therapy.  Problem # 3:  DYSLIPIDEMIA (ICD-272.4)  His updated medication list for this problem includes:    Simvastatin 40 Mg Tabs (Simvastatin) .Marland Kitchen... Take one tablet by mouth daily at bedtime Lipids are treated.  No change in therapy.  Problem # 4:  RBBB (ICD-426.4)  His updated medication list for this  problem includes:    Lisinopril 10 Mg Tabs (Lisinopril) .Marland Kitchen... Take one tablet by mouth daily    Metoprolol Tartrate 50 Mg Tabs (Metoprolol tartrate) .Marland Kitchen... Take one tablet by mouth twice a day    Aspirin 81 Mg Tbec (Aspirin) .Marland Kitchen... Take one tablet by mouth daily Patient has intermittent right bundle branch block.  Today he has a right bundle.  EKG is done and reviewed by me. There are old inferior Q waves.  His last EKG revealed incomplete right bundle-branch block.  No further workup is needed.  Problem # 5:  CAD (ICD-414.00)  His updated medication list for this problem includes:    Lisinopril 10 Mg Tabs (Lisinopril) .Marland Kitchen... Take one tablet by mouth daily    Metoprolol Tartrate 50 Mg Tabs (Metoprolol tartrate) .Marland Kitchen... Take one tablet by mouth twice a day    Aspirin 81 Mg Tbec (Aspirin) .Marland Kitchen... Take one tablet by mouth daily  Orders: EKG w/ Interpretation (93000) Nuclear Stress Test (Nuc Stress Test) Coronary disease is stable.  The patient will be reviewing his DOT physical.  We need to complete exercise test to be sure that he is not having any significant ischemia.  Myoview scan will be arranged. I will be in touch with him with information.  Cardiology followup in one year.  Patient Instructions: 1)  Your physician has requested that you have a Lexiscan myoview.  For further information please visit [email or web address]  Please follow instruction sheet, as given. 2)  Your physician wants you to follow-up in:  1 year.  You will receive a reminder letter in the mail two months in advance. If you don't receive a letter, please call our office to schedule the follow-up appointment. Prescriptions: METOPROLOL TARTRATE 50 MG TABS (METOPROLOL TARTRATE) Take one tablet by mouth twice a day  #180 x 3   Entered by:   Meredith Staggers, RN   Authorized by:   Talitha Givens, MD, [facility]   Signed by:   Meredith Staggers, RN on 10/10/2010   Method used:   Electronically to        [facility]* (retail)       [address] [place], [place]  [address]       Ph: [phone]       Fax: [phone]   RxID:   [id number]

## 2011-01-09 NOTE — Letter (Signed)
Summary: Patient Notice- Polyp Results  [facility]  [address] [place], [place] [address]   Phone: [phone]  Fax: [phone]        January 24, 2010 MRN: [id number]    [name] [name] [address] [place], [place]  [address]    Dear [name] [name],  I am pleased to inform you that the colon polyp(s) removed during your recent colonoscopy was (were) found to be benign (no cancer detected) upon pathologic examination.  I recommend you have a repeat colonoscopy examination in 3_ years to look for recurrent polyps, as having colon polyps increases your risk for having recurrent polyps or even colon cancer in the future.  Should you develop new or worsening symptoms of abdominal pain, bowel habit changes or bleeding from the rectum or bowels, please schedule an evaluation with either your primary care physician or with me.  Additional information/recommendations:  __ No further action with gastroenterology is needed at this time. Please      follow-up with your primary care physician for your other healthcare      needs.  __ Please call [phone] to schedule a return visit to review your      situation.  __ Please keep your follow-up visit as already scheduled.  _x_ Continue treatment plan as outlined the day of your exam.  Please call us if you are having persistent problems or have questions about your condition that have not been fully answered at this time.  Sincerely,  Louis Meckel MD  This letter has been electronically signed by your physician.  Appended Document: Patient Notice- Polyp Results Letter mailed 2.21.11

## 2011-01-09 NOTE — Assessment & Plan Note (Signed)
Summary: Cardiology Nuclear Testing  Nuclear Med Background Indications for Stress Test: Evaluation for Ischemia, Graft Patency  Indications Comments: DOT physical  History: Angioplasty, CABG, COPD, Echo, Heart Catheterization, Myocardial Infarction, Myocardial Perfusion Study  History Comments: '09 [id number], mid apical anterior scar at base of inferior wall.  No ischemia.  EF=58%  Symptoms: DOE    Nuclear Pre-Procedure Cardiac Risk Factors: Family History - CAD, Hypertension, Lipids, RBBB, Smoker Caffeine/Decaff Intake: NONE NPO After: 8:00 AM Lungs: Coarse, no wheezing.  O2 Sat 97% on RA. IV 0.9% NS with Angio Cath: 22g     IV Site: R Wrist IV Started by: Cathlyn Parsons, RN Chest Size (in) 44     Height (in): 70 Weight (lb): 171 BMI: 24.62 Tech Comments: METOPROLOL HELD X 16 HOURS.  Nuclear Med Study 1 or 2 day study:  1 day     Stress Test Type:  Eugenie Birks Reading MD:  Willa Rough, MD     Referring MD:  Willa Rough, MD Resting Radionuclide:  Technetium 60m Tetrofosmin     Resting Radionuclide Dose:  11.0 mCi  Stress Radionuclide:  Technetium 1m Tetrofosmin     Stress Radionuclide Dose:  33 mCi   Stress Protocol   Lexiscan: 0.4 mg   Stress Test Technologist:  [employer]a [employer], CMA-N     Nuclear Technologist:  Doyne Keel, CNMT  Rest Procedure  Myocardial perfusion imaging was performed at rest 45 minutes following the intravenous administration of Technetium 27m Tetrofosmin.  Stress Procedure  The patient received IV Lexiscan 0.4 mg over 15-seconds.  Technetium 69m Tetrofosmin injected at 30-seconds.  There were no significant changes with infusion, other than occasional PVC's.  Quantitative spect images were obtained after a 45 minute delay.  QPS Raw Data Images:  Normal; no motion artifact; normal heart/lung ratio. Stress Images:  Decreased activity in the apical septal, mid/apical anterior, base inferior Rest Images:  Same as stress Subtraction (SDS):   No evidence of ischemia. Transient Ischemic Dilatation:  1.06  (Normal <1.22)  Lung/Heart Ratio:  0.36  (Normal <0.45)  Quantitative Gated Spect Images QGS EDV:  109 ml QGS ESV:  49 ml QGS EF:  55 % QGS cine images:  Mild Septal hypokinesis  Findings Low risk nuclear study      Overall Impression  Exercise Capacity: Lexiscan with no exercise. BP Response: Normal blood pressure response. Clinical Symptoms: Chest Pressure ECG Impression: No significant ST segment change suggestive of ischemia. Overall Impression Comments: There is no change since 2009. There is old scar in the apical septum, mid/apical anterior, and base of the inferior wall. There is no ischemia. EF is still good.  Appended Document: Cardiology Nuclear Testing Pt aware of results by phone.

## 2011-01-09 NOTE — Progress Notes (Signed)
Summary: REFILL   Phone Note Refill Request Call back at Home Phone [phone] Message from:  Patient on December 31, 2009 8:42 AM  Refills Requested: Medication #1:  SIMVASTATIN 40 MG TABS Take one tablet by mouth daily at bedtime [facility],AND PT WANTS A CALL ABOUT GETTING A REFERREL TO DR.NORINS  Initial call taken by: Judie Grieve,  December 31, 2009 8:42 AM  Follow-up for Phone Call        pt awae rx was sent, doesn't need anything else at this time Meredith Staggers, RN  January 02, 2010 11:08 AM     Prescriptions: SIMVASTATIN 40 MG TABS (SIMVASTATIN) Take one tablet by mouth daily at bedtime  #90 x 3   Entered by:   Hardin Negus, RMA   Authorized by:   Talitha Givens, MD, [facility]   Signed by:   Hardin Negus, RMA on 01/01/2010   Method used:   Electronically to        [facility]* (retail)       [address] [place], [place]  [address]       Ph: [phone]       Fax: [phone]   RxID:   [id number]

## 2011-01-09 NOTE — Letter (Signed)
Summary: [facility]  [facility]   Imported By: Sherian Rein 08/27/2010 10:38:52  _____________________________________________________________________  External Attachment:    Type:   Image     Comment:   External Document

## 2011-01-09 NOTE — Letter (Signed)
Summary: lipid reminder  [facility], Main Office  [address]   [place], [place] [address]   Phone: [phone]  Fax: [phone]        May 08, 2010 MRN: [id number]    [facility] [address] [place], [place]  [address]    Dear Mr. KINDT,  Our records indicate it is time to check your cholesterol.  Please call our office to schedule an appt for labwork.  Please remember it is a fasting lab.     Sincerely,  Meredith Staggers, RN Willa Rough, MD This letter has been electronically signed by your physician.

## 2011-01-09 NOTE — Letter (Signed)
Summary: [facility]  [facility]   Imported By: Sherian Rein 10/01/2010 14:14:38  _____________________________________________________________________  External Attachment:    Type:   Image     Comment:   External Document

## 2011-01-09 NOTE — Progress Notes (Signed)
Summary: Nuclear pre procedure  Phone Note Outgoing Call Call back at [name] Phone [phone]   Call placed by: Rea College, CMA,  October 14, 2010 3:40 PM Call placed to: Patient Summary of Call: Reviewed information on Myoview Information Sheet (see scanned document for further details).  Arline Asp spoke with patient.      Nuclear Med Background Indications for Stress Test: Evaluation for Ischemia, Graft Patency  Indications Comments: DOT physical  History: Angioplasty, CABG, COPD, Echo, Heart Catheterization, Myocardial Infarction, Myocardial Perfusion Study  History Comments: '09 [id number], mid apical anterior scar at base of inferior wall.  No ischemia.  EF=58%     Nuclear Pre-Procedure Cardiac Risk Factors: Family History - CAD, Hypertension, Lipids, RBBB, Smoker Height (in): 70

## 2011-01-09 NOTE — Progress Notes (Signed)
  Phone Note Outgoing Call   Reason for Call: Discuss lab or test results Summary of Call: Please call patient: chest x-ray was normal  Thanks Initial call taken by: Jacques Navy MD,  January 02, 2010 1:18 PM  Follow-up for Phone Call        informed pt that chest xray results were normal Follow-up by: Ami Bullins CMA,  January 02, 2010 1:58 PM

## 2011-01-09 NOTE — Miscellaneous (Signed)
Summary: [facility] Previsit/prep  Clinical Lists Changes  Medications: Added new medication of MOVIPREP 100 GM  SOLR (PEG-KCL-NACL-NASULF-NA ASC-C) As per prep instructions. - Signed Rx of MOVIPREP 100 GM  SOLR (PEG-KCL-NACL-NASULF-NA ASC-C) As per prep instructions.;  #1 x 0;  Signed;  Entered by: Wyona Almas RN;  Authorized by: Louis Meckel MD;  Method used: [id number] [address], [place], [place], [place]  [address], Ph: [phone], Fax: [phone] Observations: Added new observation of ALLERGY REV: Done (01/09/2010 16:12)    Prescriptions: MOVIPREP 100 GM  SOLR (PEG-KCL-NACL-NASULF-NA ASC-C) As per prep instructions.  #1 x 0   Entered by:   Wyona Almas RN   Authorized by:   Louis Meckel MD   Signed by:   Wyona Almas RN on 01/09/2010   Method used:   [id number]       [address]       [place], [place]  [address]       Ph: [phone]       Fax: [phone]   RxID:   [id number]

## 2011-01-09 NOTE — Letter (Signed)
[facility]   Imported By: Sherian Rein 01/14/2010 07:24:05  _____________________________________________________________________  External Attachment:    Type:   Image     Comment:   External Document

## 2011-01-09 NOTE — Progress Notes (Signed)
Summary: Plt. count  ---- Converted from flag ---- ---- 01/10/2010 9:02 AM, Louis Meckel MD wrote: Thanks.  Platelet count is adequate for colonoscopy  ---- 01/09/2010 5:01 PM, Wyona Almas RN wrote: Mr. Vaile has a history of thrombocytopenia and is under the care of Dr.Murinson.  He says his Platlets run in the 60's and wanted you to be aware of this.  He had to have 7 U of blood in "04 after a hip replacement.  Alvino Chapel ------------------------------  Appended Document: Plt. count Pt. notified.

## 2011-01-09 NOTE — Letter (Signed)
Summary: [facility] Instructions  [facility]  [address] [place], [place] [address]   Phone: [phone]  Fax: [phone]       [name] [name]    1941-09-21    MRN: [id number]        Procedure Day Dorna Bloom:  [name] [name]  01/22/10     Arrival Time:  8:30AM     Procedure Time:  9:30AM     Location of Procedure:                    [facility]ian[facility] Pares _  [facility] (4th Floor)                        PREPARATION FOR COLONOSCOPY WITH MOVIPREP   Starting 5 days prior to your procedure 01/17/10 do not eat nuts, seeds, popcorn, corn, beans, peas,  salads, or any raw vegetables.  Do not take any fiber supplements (e.g. Metamucil, Citrucel, and Benefiber).  THE DAY BEFORE YOUR PROCEDURE         DATE: 01/21/10  DAY: MONDAY  1.  Drink clear liquids the entire day-NO SOLID FOOD  2.  Do not drink anything colored red or purple.  Avoid juices with pulp.  No orange juice.  3.  Drink at least 64 oz. (8 glasses) of fluid/clear liquids during the day to prevent dehydration and help the prep work efficiently.  CLEAR LIQUIDS INCLUDE: Water Jello Ice Popsicles Tea (sugar ok, no milk/cream) Powdered fruit flavored drinks Coffee (sugar ok, no milk/cream) Gatorade Juice: apple, white grape, white cranberry  Lemonade Clear bullion, consomm, broth Carbonated beverages (any kind) Strained chicken noodle soup Hard Candy                             4.  In the morning, mix first dose of MoviPrep solution:    Empty 1 Pouch A and 1 Pouch B into the disposable container    Add lukewarm drinking water to the top line of the container. Mix to dissolve    Refrigerate (mixed solution should be used within 24 hrs)  5.  Begin drinking the prep at 5:00 p.m. The MoviPrep container is divided by 4 marks.   Every 15 minutes drink the solution down to the next mark (approximately 8 oz) until the full liter is complete.   6.  Follow completed prep with 16 oz of clear liquid of your choice  (Nothing red or purple).  Continue to drink clear liquids until bedtime.  7.  Before going to bed, mix second dose of MoviPrep solution:    Empty 1 Pouch A and 1 Pouch B into the disposable container    Add lukewarm drinking water to the top line of the container. Mix to dissolve    Refrigerate  THE DAY OF YOUR PROCEDURE      DATE: 01/22/10 DAY:  TUESDAY  Beginning at 4:30AM (5 hours before procedure):         1. Every 15 minutes, drink the solution down to the next mark (approx 8 oz) until the full liter is complete.  2. Follow completed prep with 16 oz. of clear liquid of your choice.    3. You may drink clear liquids until 7:30AM (2 HOURS BEFORE PROCEDURE).   MEDICATION INSTRUCTIONS  Unless otherwise instructed, you should take regular prescription medications with a small sip of water   as early as possible the morning  of your procedure.          OTHER INSTRUCTIONS  You will need a responsible adult at least 71 years of age to accompany you and drive you home.   This person must remain in the waiting room during your procedure.  Wear loose fitting clothing that is easily removed.  Leave jewelry and other valuables at home.  However, you may wish to bring a book to read or  an iPod/MP3 player to listen to music as you wait for your procedure to start.  Remove all body piercing jewelry and leave at home.  Total time from sign-in until discharge is approximately 2-3 hours.  You should go home directly after your procedure and rest.  You can resume normal activities the  day after your procedure.  The day of your procedure you should not:   Drive   Make legal decisions   Operate machinery   Drink alcohol   Return to work  You will receive specific instructions about eating, activities and medications before you leave.    The above instructions have been reviewed and explained to me by   Wyona Almas RN  January 09, 2010 4:58 PM     I fully  understand and can verbalize these instructions _____________________________ Date _________

## 2011-01-09 NOTE — Procedures (Signed)
Summary: Colonoscopy  Patient: [name] [name] Note: All result statuses are Final unless otherwise noted.  Tests: (1) Colonoscopy (COL)   COL Colonoscopy           DONE     [facility]     [address]     [place], [place]  [address]           COLONOSCOPY PROCEDURE REPORT           PATIENT:  [name], [name]  MR#:  [id number]     BIRTHDATE:  Feb 16, 1941, 68 yrs. old  GENDER:  male           ENDOSCOPIST:  Barbette Hair. Arlyce Dice, MD     Referred by:  Rosalyn Gess. Norins, M.D.           PROCEDURE DATE:  01/22/2010     PROCEDURE:  Colonoscopy with snare polypectomy     ASA CLASS:  Class II     INDICATIONS:  Routine Risk Screening           MEDICATIONS:   Fentanyl 75 mcg IV, Versed 9 mg IV           DESCRIPTION OF PROCEDURE:   After the risks benefits and     alternatives of the procedure were thoroughly explained, informed     consent was obtained.  Digital rectal exam was performed and     revealed no abnormalities.   The LB [id number] endoscope     was introduced through the anus and advanced to the cecum, which     was identified by both the appendix and ileocecal valve, without     limitations.  The quality of the prep was good, using MoviPrep.     The instrument was then slowly withdrawn as the colon was fully     examined.     <<PROCEDUREIMAGES>>           FINDINGS:  A sessile polyp was found at the ileocecal valve. It     was 1 cm in size. Polyp was snared, then cauterized with monopolar     cautery. Retrieval was successful (see image3). snare polyp  Three     polyps were found in the descending colon. 3 6-47mm sessile polyps     Polyps were snared, then cauterized with monopolar cautery.     Retrieval was successful (see image8, image9, and image10). snare     polyp in the sigmoid colon.  A sessile polyp was found It was 7 mm     in size. It was found 30 cm from the point of entry. Polyp was     snared, then cauterized with monopolar cautery. Retrieval was   successful (see image14). snare polyp  This was otherwise a normal     examination of the colon (see image1, image2, image4, image5,     image7, image11, image17, and image18).   Retroflexed views in the     rectum revealed no abnormalities.    The scope was then withdrawn     from the patient and the procedure completed.           COMPLICATIONS:  None           ENDOSCOPIC IMPRESSION:     1) 1 cm sessile polyp at the ileocecal valve     2) Three polyps in the descending colon     3) 7 mm sessile polyp in the sigmoid colon     4) Otherwise normal  examination     RECOMMENDATIONS:     1) colonoscopy in 3 years because of multiplicity of polyps           REPEAT EXAM:  In 3 year(s) for Colonoscopy.           ______________________________     Barbette Hair. Arlyce Dice, MD           CC:           n.     eSIGNED:   Barbette Hair. Wrangler Penning at 01/22/2010 11:21 AM           Page 2 of 3   [place], [place], [phone]  Note: An exclamation mark (!) indicates a result that was not dispersed into the flowsheet. Document Creation Date: 01/22/2010 11:21 AM _______________________________________________________________________  (1) Order result status: Final Collection or observation date-time: 01/22/2010 11:11 Requested date-time:  Receipt date-time:  Reported date-time:  Referring Physician:   Ordering Physician: Melvia Heaps [phone]) Specimen Source:  Source: Launa Grill Order Number: [id number] Lab site:   Appended Document: Colonoscopy     Procedures Next Due Date:    Colonoscopy: 01/2013

## 2011-01-09 NOTE — Letter (Signed)
Summary: [facility] Office Visit Note   [facility] Office Visit Note   Imported By: Roderic Ovens 11/14/2010 08:59:50  _____________________________________________________________________  External Attachment:    Type:   Image     Comment:   External Document

## 2011-01-09 NOTE — Letter (Signed)
Summary: [facility]  [facility]   Imported By: Sherian Rein 07/30/2010 09:31:29  _____________________________________________________________________  External Attachment:    Type:   Image     Comment:   External Document

## 2011-01-09 NOTE — Assessment & Plan Note (Signed)
Summary: STOMACH PROBLEMS / LOV MAY 2003 /NWS   Vital Signs:  Patient profile:   70 year old male Height:      70 inches Weight:      175 pounds BMI:     25.20 O2 Sat:      98 % on Room air Temp:     97.6 degrees F oral Pulse rate:   73 / minute BP sitting:   128 / 80  (left arm) Cuff size:   large  Vitals Entered By: Bill Salinas CMA (January 01, 2010 1:23 PM)  O2 Flow:  Room air CC: pt here today with several complaints. 1. pt c/o gas and bloating after eatting. Pt also states his bowel movements are hard but does states he only has a BM about once every 3 days. These symptoms have been going on about 5 months. 2. pt c/o cough x 3 months with production of "white" mucous. Pt does smoke at 1 pack  a week/ ab   Primary Care Provider:  T[place]ania Basham  CC:  pt here today with several complaints. 1. pt c/o gas and bloating after eatting. Pt also states his bowel movements are hard but does states he only has a BM about once every 3 days. These symptoms have been going on about 5 months. 2. pt c/o cough x 3 months with production of "white" mucous. Pt does smoke at 1 pack  a week/ ab.  History of Present Illness: He is having a lot of gas: he gets bloating (tight "britches"). His bowel habit has changed to every third day and he remains constipated and has hard stools. No particular food causes discomfort. He does get relief with tums or other antacids. He has had some BRB per rectum with straining with stool. No vomiting.  He reports a 3 month h/o sinus drainage and lots of white mucus, no blood. He denies being short of breath. He usually coughs in the morning . He continues to smoke - about a pack or less a week. Last chest x-ray was in '04 while in hospital.   Current Medications (verified): 1)  Simvastatin 40 Mg Tabs (Simvastatin) .... Take One Tablet By Mouth Daily At Bedtime 2)  Lisinopril 10 Mg Tabs (Lisinopril) .... Take One Tablet By Mouth Daily 3)  Metoprolol Tartrate 50 Mg Tabs  (Metoprolol Tartrate) .... Take One Tablet By Mouth Twice A Day 4)  Aspirin 81 Mg Tbec (Aspirin) .... Take One Tablet By Mouth Daily 5)  Stool Softener Caps (Docusate Sodium) .... As Needed 6)  Coricidin D Cold/flu/sinus 2-5-325 Mg Tabs (Chlorphen-Phenyleph-Apap) .... As Needed 7)  Centrum Silver  Tabs (Multiple Vitamins-Minerals) .Marland Kitchen.. 1 Tab Daily  Allergies (verified): 1)  ! * Pcn  Past History:  Past Medical History: Last updated: 10/22/2009 RBBB  .. 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 /  55-65%... echo... 2004  /   55%... nuclear study... September, 2009 CAD (ICD-414.00)...nuclear stress... September, 2009... no ischemia... EF 55%... old scar.  Apical septum.Marland Kitchen decreased motion apical anterior Prostatitis.Marland Kitchen by history Penicillin allergy... Hip surgery...  Past Surgical History: Coronary artery bypass graft - 5 vessel '04  THR-right - 11/04  Family History: Father - deceased @51 : cerebral hemorrhage Mother - deceased @ 6: COPD/emphysema Positive for heart Neg - colon cancer, prostate cancer; DM;  Many 2nd degree male relatives with heart disease.   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 married - '63 1 son - '70, 1 daughter '67; 2 grandchildren He has no intake of tobacco or  alcohol products.   He has a one-level home and his wife plus outpatient  physical therapy as well as home physical therapy will be his rehab.  Review of Systems       The patient complains of anorexia, prolonged cough, and abdominal pain.  The patient denies fever, weight loss, weight gain, vision loss, chest pain, dyspnea on exertion, headaches, hemoptysis, severe indigestion/heartburn, incontinence, muscle weakness, difficulty walking, depression, and enlarged lymph nodes.         early satiety, nocturia  2-3  Physical Exam  General:  WNWD white male Head:  normocephalic and atraumatic.   Eyes:  pupils equal, pupils round, and corneas and lenses clear.   Neck:  supple and full ROM.   Chest Wall:  no deformities and no tenderness.   Lungs:  normal respiratory effort, no intercostal retractions, no accessory muscle use, normal breath sounds, and no wheezes.   Heart:  normal rate, regular rhythm, and no HJR.   Abdomen:  soft, normal bowel sounds, no distention, no guarding, and no rebound tenderness.   Msk:  normal ROM, no joint tenderness, no joint swelling, and no joint deformities.   Pulses:  2+ radial Neurologic:  alert & oriented X3, cranial nerves II-XII intact, and gait normal.   Skin:  turgor normal, color normal, no petechiae, and no ulcerations.   Cervical Nodes:  no anterior cervical adenopathy and no posterior cervical adenopathy.   Psych:  Oriented X3, memory intact for recent and remote, normally interactive, and good eye contact.     Impression & Recommendations:  Problem # 1:  DYSPEPSIA (ICD-536.8) UGI symptoms of early satiety, eructation, reflux all c/w dyspepsia. No evidence of UGI bleed.  Plan - PPI therapy: nexium 40mg  qAM x 4 weeks then consider H2 blockers  Problem # 2:  TOBACCO ABUSE (ICD-305.1) Patient with a morning cough productive of white phlegm. He denies any severe symptoms of SOB.  Plan  CXR screening          stop smoking - referred to 1 800 quit now  Orders: T-2 View CXR (71020TC)  Normal chest x-ray. DG CHEST 2 VIEW - [id number]   Clinical Data: Cough.  Smoking history.  Coronary artery disease.   CHEST - 2 VIEW   Comparison: 05/01/2003   Findings: There has been previous median sternotomy and CABG. Heart size is normal.  The lungs are hyperinflated but there is no infiltrate, mass, effusion or collapse.  Ordinary degenerative changes effect the spine which shows mild scoliosis.   IMPRESSION: Previous CABG.  No active disease.   Read  By:  Thomasenia Sales,  M.D.  Problem # 3:  OTHER SYMPTOMS INVOLVING DIGESTIVE SYSTEM OTHER (ICD-787.99) Patient with a change in Bowel habit with constipation. No weight changes. Last colorectal cancer screen in '96 - flex sig.  Plan - refer for colonosocopy          MOM until good BM          Bulk laxative daily for maintenance of good bowel habit.  Complete Medication List: 1)  Simvastatin 40 Mg Tabs (Simvastatin) .... Take one tablet by mouth daily at bedtime 2)  Lisinopril 10 Mg Tabs (Lisinopril) .... Take one tablet by mouth daily 3)  Metoprolol Tartrate 50 Mg Tabs (Metoprolol tartrate) .... Take one tablet by mouth twice a day 4)  Aspirin 81  Mg Tbec (Aspirin) .... Take one tablet by mouth daily 5)  Stool Softener Caps (docusate Sodium)  .... As needed 6)  Coricidin D Cold/flu/sinus 2-5-325 Mg Tabs (Chlorphen-phenyleph-apap) .... As needed 7)  Centrum Silver Tabs (Multiple vitamins-minerals) .Marland Kitchen.. 1 tab daily 8)  Nexium 40 Mg Cpdr (Esomeprazole magnesium) .Marland Kitchen.. 1 by mouth once daily  Other Orders: Gastroenterology Referral (GI)  Patient Instructions: 1)  1. Bloating,gas, early satiety - all symptoms of gastric irritation or ulcer. Plan - Nexium 40mg  once every AM  before  breakfast for 25 days. If symptoms are improved will switch to less expensive over the counter medication. If it doesn't work you will need to see a stomach specialist. 2)  2. Constipation and diffiuclt bowel habit - plan take a full cap of Milk of Mag every 4 hours during the day up to 4 doses. If you don't have a cleansing bowel movement(s), do the same treatment the next day. Once the bowels have moved well take a bulk laxative at least once a day, maybe twice, drinking lots of fluids, to keep the bowel regular. 3)  3. Cough - the AM cough is very suggestive of a smoker's morning cough. Plan - chest x-ray today. STOP SMOKING - call [phone] now for assistance. 4)  4. Regular maintenance - due to a change in bowel  habit and just for routine maintenance you need a colonoscopy. We will set this up for you with our GI department.

## 2011-01-09 NOTE — Letter (Signed)
Summary: [facility]  [facility], Main Office  [address]   [place], [place] [address]   Phone: [phone]  Fax: [phone]     May 22, 2010 MRN: [id number]   [name] [name] [address] [place], [place]  [address]   Dear [name],  We have reviewed your cholesterol results.  They are as follows:     Total Cholesterol:    157 (Desirable: less than 200)       HDL  Cholesterol:     46.00  (Desirable: greater than 40 for men and 50 for women)       LDL Cholesterol:       93  (Desirable: less than 100 for low risk and less than 70 for moderate to high risk)       Triglycerides:       90.0  (Desirable: less than 150)  Our recommendations include:  Looks Ok, continue current medications   Call our office at the number listed above if you have any questions.  Lowering your LDL cholesterol is important, but it is only one of a large number of "risk factors" that may indicate that you are at risk for heart disease, stroke or other complications of hardening of the arteries.  Other risk factors include:   A.  Cigarette Smoking* B.  High Blood Pressure* C.  Obesity* D.   Low HDL Cholesterol (see yours above)* E.   Diabetes Mellitus (higher risk if your is uncontrolled) F.  Family history of premature heart disease G.  Previous history of stroke or cardiovascular disease    *These are risk factors YOU HAVE CONTROL OVER.  For more information, visit .  There is now evidence that lowering the TOTAL CHOLESTEROL AND LDL CHOLESTEROL can reduce the risk of heart disease.  The American Heart Association recommends the following guidelines for the treatment of elevated cholesterol:  1.  If there is now current heart disease and less than two risk factors, TOTAL CHOLESTEROL should be less than 200 and LDL CHOLESTEROL should be less than 100. 2.  If there is current heart disease or two or more risk factors, TOTAL CHOLESTEROL should be less than 200 and LDL  CHOLESTEROL should be less than 70.  A diet low in cholesterol, saturated fat, and calories is the cornerstone of treatment for elevated cholesterol.  Cessation of smoking and exercise are also important in the management of elevated cholesterol and preventing vascular disease.  Studies have shown that 30 to 60 minutes of physical activity most days can help lower blood pressure, lower cholesterol, and keep your weight at a healthy level.  Drug therapy is used when cholesterol levels do not respond to therapeutic lifestyle changes (smoking cessation, diet, and exercise) and remains unacceptably high.  If medication is started, it is important to have you levels checked periodically to evaluate the need for further treatment options.  Thank you,    [employer] Team

## 2011-01-09 NOTE — Letter (Signed)
Summary: [facility]  [facility]   Imported By: Lennie Odor 10/22/2010 14:19:32  _____________________________________________________________________  External Attachment:    Type:   Image     Comment:   External Document

## 2011-01-09 NOTE — Letter (Signed)
[facility]   Imported By: Lennie Odor 06/25/2010 14:51:42  _____________________________________________________________________  External Attachment:    Type:   Image     Comment:   External Document

## 2011-01-10 NOTE — Letter (Signed)
Summary: Outpatient Coinsurance Notice  Outpatient Coinsurance Notice   Imported By: Marylou Mccoy 11/14/2010 15:12:29  _____________________________________________________________________  External Attachment:    Type:   Image     Comment:   External Document

## 2011-01-15 NOTE — Letter (Signed)
Summary: [facility]   Imported By: Sherian Rein 01/10/2011 09:17:07  _____________________________________________________________________  External Attachment:    Type:   Image     Comment:   External Document

## 2011-01-23 NOTE — Letter (Addendum)
Summary: [facility]  [facility]   Imported By: Sherian Rein 01/16/2011 07:16:41  _____________________________________________________________________  External Attachment:    Type:   Image     Comment:   External Document

## 2011-01-29 NOTE — Letter (Signed)
Summary: [facility]: Office Progress Note  [facility]: Office Progress Note   Imported By: Earl Many 01/22/2011 09:07:57  _____________________________________________________________________  External Attachment:    Type:   Image     Comment:   External Document

## 2011-02-21 LAB — DIFFERENTIAL
Eosinophils Relative: 5 % (ref 0–5)
Lymphocytes Relative: 11 % — ABNORMAL LOW (ref 12–46)
Lymphs Abs: 1.4 10*3/uL (ref 0.7–4.0)
Monocytes Absolute: 0.7 10*3/uL (ref 0.1–1.0)
Neutro Abs: 10.2 10*3/uL — ABNORMAL HIGH (ref 1.7–7.7)

## 2011-02-21 LAB — URINALYSIS, ROUTINE W REFLEX MICROSCOPIC
Bilirubin Urine: NEGATIVE
Glucose, UA: NEGATIVE mg/dL
Ketones, ur: NEGATIVE mg/dL
Specific Gravity, Urine: 1.022 (ref 1.005–1.030)
pH: 6 (ref 5.0–8.0)

## 2011-02-21 LAB — BASIC METABOLIC PANEL
BUN: 16 mg/dL (ref 6–23)
CO2: 24 mEq/L (ref 19–32)
Calcium: 9.7 mg/dL (ref 8.4–10.5)
Chloride: 110 mEq/L (ref 96–112)
Creatinine, Ser: 0.93 mg/dL (ref 0.4–1.5)
GFR calc Af Amer: >60 mL/min (ref >60)
GFR calc non Af Amer: >60 mL/min (ref >60)
Glucose, Bld: 122 mg/dL — ABNORMAL HIGH (ref 70–99)
Potassium: 3.8 mEq/L (ref 3.5–5.1)
Sodium: 145 mEq/L (ref 135–145)

## 2011-02-21 LAB — CBC
HCT: 42.2 % (ref 39.0–52.0)
MCV: 89 fL (ref 78.0–100.0)
RBC: 4.74 MIL/uL (ref 4.22–5.81)
WBC: 13 10*3/uL — ABNORMAL HIGH (ref 4.0–10.5)

## 2011-02-21 LAB — URINE MICROSCOPIC-ADD ON

## 2011-02-25 ENCOUNTER — Ambulatory Visit (INDEPENDENT_AMBULATORY_CARE_PROVIDER_SITE_OTHER): Payer: Medicare PPO | Admitting: Urology

## 2011-02-25 DIAGNOSIS — N400 Enlarged prostate without lower urinary tract symptoms: Secondary | ICD-10-CM

## 2011-02-25 DIAGNOSIS — N529 Male erectile dysfunction, unspecified: Secondary | ICD-10-CM

## 2011-02-25 DIAGNOSIS — N281 Cyst of kidney, acquired: Secondary | ICD-10-CM

## 2011-03-20 ENCOUNTER — Other Ambulatory Visit (HOSPITAL_COMMUNITY): Payer: Self-pay | Admitting: Oncology

## 2011-03-20 ENCOUNTER — Encounter (HOSPITAL_BASED_OUTPATIENT_CLINIC_OR_DEPARTMENT_OTHER): Payer: Medicare PPO | Admitting: Oncology

## 2011-03-20 DIAGNOSIS — D472 Monoclonal gammopathy: Secondary | ICD-10-CM

## 2011-03-20 DIAGNOSIS — D696 Thrombocytopenia, unspecified: Secondary | ICD-10-CM

## 2011-03-20 LAB — CBC WITH DIFFERENTIAL/PLATELET
BASO%: 0.3 % (ref 0.0–2.0)
EOS%: 7.5 % — ABNORMAL HIGH (ref 0.0–7.0)
Eosinophils Absolute: 0.7 10*3/uL — ABNORMAL HIGH (ref 0.0–0.5)
LYMPH%: 19.3 % (ref 14.0–49.0)
MCHC: 33.9 g/dL (ref 32.0–36.0)
MCV: 91.1 fL (ref 79.3–98.0)
MONO%: 5.4 % (ref 0.0–14.0)
NEUT#: 6.2 10*3/uL (ref 1.5–6.5)
Platelets: 71 10*3/uL — ABNORMAL LOW (ref 140–400)
RBC: 4.77 10*6/uL (ref 4.20–5.82)
RDW: 13.4 % (ref 11.0–14.6)

## 2011-04-22 NOTE — Assessment & Plan Note (Signed)
[facility]                            CARDIOLOGY OFFICE NOTE   NAME:[name] [name], [name] [name]                     MRN:          [id number]  DATE:08/04/2008                            DOB:          [birth year]    [name] [name] is doing great.  I saw him last in August 2008.  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  With much less distress, he is doing very well.  He is continuing to drive about 3 days a week.  He underwent CABG in  2004.  He has done very well.  He does not have any chest discomfort.  He has no shortness of breath.  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  This will be done in the next few weeks.  We know  he has a low normal ejection fraction at 50%.  He is on aspirin and  statin.  We need to follow up fasting lipid profile.  He is going about  full activities.   ALLERGIES:  PENICILLIN.   MEDICATIONS:  1. Simvastatin 80 mg.  2. Lisinopril 10 mg.  3. Metoprolol 50 b.i.d.  4. Aspirin 81.  5. Stool softener as needed.   OTHER MEDICAL PROBLEMS:  See the complete list below.   REVIEW OF SYSTEMS:  He has no significant complaints.  He will be  following up with [name] [name] who follows his platelets carefully after  his episode of thrombocytopenia.  Otherwise his review of systems is  negative.   PHYSICAL EXAMINATION:  VITAL SIGNS:  Blood pressure is 128/76 with a  pulse of 69.  The patient is oriented to person, time, and place.  Affect is normal.  HEENT:  Reveals no xanthelasma.  He has normal  extraocular motion.  There are no carotid bruits.  There is no jugular  venous distention.  He has a small sebaceous pimple on his forehead  today.  LUNGS:  Clear.  Respiratory effort is not labored.  CARDIAC:  Reveals S1 and S2.  There are no clicks or significant  murmurs.  ABDOMEN:  Soft.  He has no peripheral edema.   EKG is unchanged.  He has an old  inferior infarct.   PROBLEMS:  1. Coronary disease post CABG in 2004.  He is stable.  We will arrange      for a followup stress nuclear scan to be sure that he is stable to      continue to drive a big truck.  2. Ejection fraction 50%.  3. Dyslipidemia.  He is on simvastatin and fasting lipid will be done.  4. History of hypertension treated.  5. History of thrombocytopenia followed carefully by [name] [name].  6. Next history of prostatitis stable.  7. Penicillin allergy.  8. Status post hip surgery.   [name] [name] is doing well.  We will followup his lipids and his status  with his nuclear scan to be sure that he can be cleared for his DOT  documents.  [name] Abed, MD, [facility]  Electronically Signed    JDK/MedQ  DD: 08/04/2008  DT: 08/05/2008  Job #: [id number]   cc:   [name] [name], M.D.

## 2011-04-22 NOTE — Assessment & Plan Note (Signed)
HEALTHCARE                            CARDIOLOGY OFFICE NOTE   NAME:[name] [name] CORVINO                     MRN:          [id number]  DATE:08/04/2007                            DOB:          May 09, 1941    Mr. [name] [name] is doing well. 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 He  has driven a great deal this year and he is actually doing very well. He  is not having any chest pain or significant shortness of breath. He did  have a significant bout of bronchitis. He continues to improve. Also, he  is having some low back pain, but otherwise he is doing well. He has  documented coronary disease, close CABG in 2004.   PAST MEDICAL HISTORY:   ALLERGIES:  PENICILLIN   MEDICATIONS:  1. Simvastatin 80 mg.  2. Lisinopril 10 mg.  3. Metoprolol 50 mg b.i.d.  4. Aspirin 81 mg.  5. Stool softener.   OTHER MEDICAL PROBLEMS:  See the list below.   REVIEW OF SYSTEMS:  See the HPI. Other than the HPI, his review of  systems is negative.   PHYSICAL EXAMINATION:  VITAL SIGNS:  Blood pressure 128/76, pulse 68,  weight 176 pounds.  GENERAL:  The patient is oriented to person, time, and place. Affect is  normal.  HEENT:  Reveals no xanthelasma. He has normal extraocular motion.  NECK:  There are no carotid bruits. There is no jugular venous  distension.  LUNGS:  Clear. Respiratory effort is not labored.  CARDIAC:  S1 with an S2. There are no clicks or significant murmurs.  ABDOMEN:  Soft. He has normal bowel sounds. There is no significant  peripheral edema.  EXTREMITIES:  There are good distal pulses.  MUSCULOSKELETAL:  No deformities.   No labs are done today. The patient did have a exercise Myoview scan on  07/16/2007. He received adenosine. There were no significant EKG changes.  There was no change in his study since 2007. He has old scar in the  septum and the apex, and the inferior  wall, with slight peri-infarct  ischemia. However, there is no significant ischemia. Overall, his  ejection fraction is 55%.   Mr. [name] [name] is doing well. Problems include:  1. Coronary disease post CABG in 2004 and a stable nuclear study in      2008.  2. Ejection fraction of 50%.  3. Dyslipidemia. He is on Zocor and he will have follow up fasting      lipid profile soon.  4. Hypertension, treated.  5. History of thrombocytopenia. This has stabilized. We will check a      CBC with platelets.  6. History of prostatitis.  7. Penicillin allergy.  8. Recent bronchitis from which he is almost completely improved.  9. Status post hip surgery.  10.Ongoing low back pain. He is to remain off of non-steroidal      antiinflammatory medications.   Labs will be drawn and I will see him back in one year.     Tinnie Gens  Sedonia Small, MD, [facility]  Electronically Signed    JDK/MedQ  DD: 08/04/2007  DT: 08/05/2007  Job #: [id number]   cc:   Samul Dada, M.D.

## 2011-04-25 NOTE — Cardiovascular Report (Signed)
NAME:  [name] [name], HELFMAN                        ACCOUNT NO.:  [id number]   MEDICAL RECORD NO.:  [id number]                   PATIENT TYPE:  INP   LOCATION:  2012                                 FACILITY:  [facility]   PHYSICIAN:  Arturo Morton. Riley Kill, M.D.             DATE OF BIRTH:  1941/03/24   DATE OF PROCEDURE:  04/05/2003  DATE OF DISCHARGE:  04/15/2003                              CARDIAC CATHETERIZATION   INDICATIONS:  Left arm and shoulder pain.   PROCEDURE:  1. Left heart catheterization.  2. Selective coronary arteriography.  3. Selective left ventriculography.  4. Subclavian angiography.  5. Aortic root aortography.   DESCRIPTION OF PROCEDURE:  The procedure was performed from the femoral  artery using a #5 French catheter because of reduced platelet count.  There  were no complications.   HEMODYNAMIC DATA:  1. Central aorta 139/82, mean 106.  2. Left ventricle 117/6/10.  3. No aortic left ventricular gradient on pull-back across the aortic valve.   ANGIOGRAPHIC DATA:  1. Ventriculography in the RAO projection reveals moderate hypokinesis of     the inferior wall, ejection fraction was calculated at 53%.  2. The subclavian and internal mammary appeared to be widely patent.  3. The aortic root demonstrates no evidence of significant aortic     regurgitation and good leaflet motion.  4. The left main coronary is free of critical disease.  5. The LAD demonstrates a segmental plaque of about 50 to 60% after the     takeoff of the major diagonal.  The distal LAD appears to be suitable for     grafting.  The major diagonal itself has a 90% ostial stenosis and then a     90% stenosis just before it bifurcates.  There are large caliber vessels.  6. The ramus intermedius demonstrates diffuse luminal irregular with about a     70% sub-branch stenosis.  7. The circumflex had a slight tortuosity proximally, there was a large     marginal branch with about 80% to 90% segmental  narrowing followed by a     70% area.  The distal vessel was graftable.  8. The right coronary artery is a dominant vessel.  There is 40% segmental     narrowing proximally then a 95% mid stenosis.  There is 80% narrowing at     the bifurcation of the crux with involvement of the distal right coronary     artery proximal PDA and continuation branch.   CONCLUSIONS:  1. Mild introduction left ventricular function with an inferobasal wall     motion abnormality.  2. Severe three-vessel coronary artery disease.    DISPOSITION:  The case was discussed with Dr. Myrtis Ser.  Coronary artery bypass  grafting will be recommended when the platelet count increases.  Arturo Morton. Riley Kill, M.D.    TDS/MEDQ  D:  08/24/2003  T:  08/25/2003  Job:  [id number]

## 2011-04-25 NOTE — H&P (Signed)
   NAME:  LEXINGTON, KROTZ                        ACCOUNT NO.:  [id number]   MEDICAL RECORD NO.:  [id number]                   PATIENT TYPE:  INP   LOCATION:  NA                                   FACILITY:  [facility]   PHYSICIAN:  Marlowe Kays, M.D.               DATE OF BIRTH:  05/24/1941   DATE OF ADMISSION:  10/16/2003  DATE OF DISCHARGE:                                HISTORY & PHYSICAL   No dictation for this job.     Dooley L. Cherlynn June.                 Marlowe Kays, M.D.    DLU/MEDQ  D:  10/10/2003  T:  10/10/2003  Job:  [id number]

## 2011-04-25 NOTE — Op Note (Signed)
[facility]  Patient:    [name] [name], [name] [name]                     MRN: [id number] Proc. Date: 07/20/01 Adm. Date:  [id number] Attending:  Marlowe Kays Page                           Operative Report  NO DICTATION. DD:  07/20/01 TD:  07/20/01 Job: [id number]

## 2011-04-25 NOTE — Assessment & Plan Note (Signed)
[facility]                              CARDIOLOGY OFFICE NOTE   NAME:[name] [name], [name] [name]                     MRN:          [id number]  DATE:06/19/2006                            DOB:          [birth year]    SUBJECTIVE:  [name] [name] is a 70 year old male patient followed by Dr. Myrtis Ser  with a history of coronary disease, status post CABG in May 2004, with  grafts including LIMA to the LAD, vein graft to the diagonal, vein graft to  the first obtuse marginal, vein graft to the posterior descending and  posterolateral.  He is here today for routine followup.  He recently had  adenosine Myoview done as part of his DOT physical.  This revealed prior  septal apical and inferior infarct with mild peri-infarct ischemia and an EF  of 50%.  This is essentially unchanged from his nuclear scan done back in  September 2005.  He is doing well today without any complaints of chest  pain, shortness of breath, syncope, presyncope, orthopnea, __________  ,  dyspnea.  He continues to smoke cigarettes.   CURRENT MEDICATIONS:  1.  Multivitamin daily.  2.  Lescol 80 mg q.h.s.  3.  Aspirin 81 mg daily.  4.  Metoprolol 50 mg b.i.d.  5.  Stool softener p.r.n.  6.  Tylenol p.r.n.  7.  Darvocet p.r.n.   ALLERGIES:  PENICILLIN.   SOCIAL HISTORY:  He continues to smoke 1 pack of cigarettes per week.   PHYSICAL EXAMINATION:  GENERAL:  He is a well nourished, well developed male  in no distress.  VITAL SIGNS:  Blood pressure is 150/87, pulse 96, weight 174 pounds.  HEENT:  Unremarkable.  NECK:  Without JVD.  Carotids are 2+ bilaterally without bruits.  CARDIAC:  S1 S2.  Regular rate and rhythm without murmurs.  LUNGS:  Clear to auscultation bilaterally without wheezing, rhonchi, or  rales.  ABDOMEN:  Soft, nontender with normoactive bowel sounds.  No rebound or  guarding.  EXTREMITIES:  Without edema.   IMPRESSION:  1.  Coronary artery disease.      1.  Status post  coronary artery bypass graft in 2004, with grafts as          noted above.      2.  Stable nuclear study, June 2007, as noted above.  2.  Ischemic cardiomyopathy.  Ejection fraction of 50%.  3.  Dyslipidemia, treated.      1.  Last lipid panel showed a suboptimal LDL at 101, HDL at 35.1, total          cholesterol 152, and triglycerides 80.  4.  Hypertension, uncontrolled.  5.  History of thrombocytopenia.  6.  Tobacco abuse.   PLAN:  1.  The patient is doing well from a cardiovascular standpoint.  He needs      better control of his blood pressure and I have added Lisinopril 10 mg a      day.  2.  He will get a followup B-MET in a week.  3.  His lipid panel is suboptimal and we have  elected to switch his Lescol      over to Zocor 80 mg q.h.s.  We will check his lipid and LFTs about 8      weeks after starting that.  If his LDL is still above 70, then we will      consider switching him over to Vytorin.   Dr. Myrtis Ser also saw the patient today.  He will follow up with Dr. Myrtis Ser in  about 6 months' time.                                  Tereso Newcomer, PA-C    SW/MedQ  DD:  06/19/2006  DT:  06/19/2006  Job #:  [id number]

## 2011-04-25 NOTE — H&P (Signed)
NAME:  [name] [name], [name] [name]                        ACCOUNT NO.:  [id number]   MEDICAL RECORD NO.:  [id number]                   PATIENT TYPE:  INP   LOCATION:  NA                                   FACILITY:  [facility]   PHYSICIAN:  Marlowe Kays, M.D.               DATE OF BIRTH:  [birth year]   DATE OF ADMISSION:  DATE OF DISCHARGE:                                HISTORY & PHYSICAL   PRESENT ILLNESS:  This 70 year old white male, who had been seen by Dr.  Simonne Come for continued and progressive problems concerning his right hip.  He has also had problems with his right knee and had arthroscopic surgery  done back in August of 2002.  Unfortunately now he is continuing on with the  knee pain that has been bothering him for about two years but his major  problem is in his right hip.  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  Examination  reveals an antalgic gait pattern with cane held in his right hand.  Examination of the knee is essentially normal at this time.  However, he has  marked loss of motion to the right hip.  He has 45-degree contracture to  flexion, and internal and external rotation is markedly uncomfortable.  X-  rays standing and nonstanding of the right knee shows good alignment and  joint space preservation; however, the hip on the right shows complete  obliteration of the joint with cystic changes in the femoral head and bone  on the bone deformity.  The patient had had consideration of total hip  replacement arthroplasty on the right in the not too distant past, however,  his preoperative studies showed that he had blockage, etcetera, to the  coronary arteries and the patient has undergone coronary artery bypass graft  x5.  He has also had angioplasties in the past.  He also has  thrombocytopenia and has been cleared for surgery by Dr.  Arline Asp as well as  Dr. Myrtis Ser for Cardiology.   PAST MEDICAL HISTORY:  1. The patient has been in relatively good health throughout his lifetime.  2. He does have hypercholesterolemia.  3. Hypertension.  4. Thrombocytopenia as mentioned above.  5. He has generalized osteoarthritis as well.   PAST SURGICAL HISTORY:  1. Angioplasty in 1993 and 1995.  2. Arthroscopy to his right knee in 2002.  3. Coronary artery bypass graft in 2004.   CURRENT MEDICATIONS:  Lescol and metoprolol.  He has been taking aspirin 81  mg up until five days ago.   ALLERGIES:  PENICILLIN which as a youth caused him to blackout and ULTRAM  which causes alteration of mood.   Dr. Illene Regulus is his General Medical Physician.   SOCIAL HISTORY:  The patient is a Production designer, theatre/television/film at [employer].  He is married.  He has two grown children.  He has no intake of tobacco or  alcohol products.  He has a one-level home and his wife plus outpatient  physical therapy as well as home physical therapy will be his rehab.   FAMILY HISTORY:  Positive for heart disease, arthritis, and father died of  aneurysm at 76 years of age.   REVIEW OF SYSTEMS:  CNS:  No seizures, no paralysis, numbness, double  vision.  RESPIRATORY:  No productive cough, no hemoptysis, no shortness of  breath.  CARDIOVASCULAR:  No chest pain, no angina, no orthopnea.  GASTROINTESTINAL:  No nausea, vomiting or bloody stools.  GENITOURINARY:  No  discharge, dysuria, hematuria.  MUSCULOSKELETAL:  Primarily in present  illness with the right hip.   PHYSICAL EXAMINATION:  Alert and cooperative friendly 70 year old white male  who walks with a cane.  VITAL SIGNS:  Blood pressure 136/82, pulse 80 regular, respirations 12.  HEENT:  Normocephalic.  Wears glasses, PERRLA, EOMs intact.  Oropharynx is  clear.  He is missing some frontal upper teeth.  NECK:  Supple.  No lymphadenopathy.  CHEST:  Clear to auscultation.  No rhonchi, no rales.  HEART:   Regular rate and rhythm.  No murmurs heard.  ABDOMEN:  Soft and nontender.  Liver and spleen not felt.  GENITALIA/RECTAL:  Not done, not pertinent to present illness.  EXTREMITIES:  Right hip, as in present illness above.   ADMITTING DIAGNOSES:  1. Osteoarthritis right hip.  2. Thrombocytopenia.  3. Hypertension.  4. Coronary artery disease status post coronary artery bypass graft.   PLAN:  Patient to undergo right total hip replacement arthroplasty.  Plan  for him to have Home Health with Genevieve Norlander.  If he has slow progress  postoperatively, then we will certainly ask for rehab consult.  Should he  have any medical problems in the hospital, we will ask Dr. Illene Regulus to  follow along with [place] and Dr. Arline Asp for his thrombocytopenia, and Dr. Myrtis Ser  for any coronary problems.     Dooley L. Cherlynn June.                 Marlowe Kays, M.D.    DLU/MEDQ  D:  10/10/2003  T:  10/10/2003  Job:  [id number]   cc:   Rosalyn Gess. Norins, M.D. [facility]   Willa Rough, M.D.   Samul Dada, M.D.  [address] [facility]  [place]  [place] [address]  Fax: [phone]

## 2011-04-25 NOTE — Consult Note (Signed)
NAME:  [name] [name], [name] [name]                        ACCOUNT NO.:  [id number]   MEDICAL RECORD NO.:  [id number]                   PATIENT TYPE:  INP   LOCATION:  0454                                 FACILITY:  [facility]   PHYSICIAN:  Samul Dada, M.D.            DATE OF BIRTH:  Nov 15, 1941   DATE OF CONSULTATION:  10/20/2003  DATE OF DISCHARGE:                                   CONSULTATION   REASON FOR CONSULTATION:  This is a 70 year old white male who is known to  our group for approximately 6 to 8 months. We originally saw him for  moderate thrombocytopenia of uncertain etiology. It was thought at the time  that mild dysplastic syndrome or idiopathic thrombocytopenia purpura were  the most likely causes. Because the patient had a documented history of low  platelets back as far as 1999, and this may be baseline for him, we elected  not to pursue bone marrow examination at that time. An ultrasound of the  spleen to look for splenomegaly was recommended to be done at some time in  the future. We continued to follow the patient on a weekly and then every  other week basis, checking labs. His platelet count remained fairly stable.  On July 06, 2003 they were 87,000. On July 13, 2003 they were 63,000. On  September 07, 2003 97,000. And October 05, 2003 77,000. We are asked to see  the patient at this time because he has had right hip arthroplasty on  October 16, 2003 and then unfortunately postoperative, went into hypovolemic  shock. He was given a total of 6 units of red blood cells, 2 units of  platelets and 2 units of fresh frozen plasma. The question at this point,  because of the increased risk of deep vein thrombosis status post this  surgery and now that he has stabilized, will he be able to tolerate  Coumadin.   PAST MEDICAL HISTORY:  1. Significant for CAD status post heart catheterization in April of this     year, then with 3 vessel coronary disease, preserved left  ventricular     function and then CABG in May of this year.  2. History of thrombocytopenia evaluated in April of 2004.  3. Severe osteoarthritis status post knee replacement and recent hip     replacement.  4. History of prostatitis.  5. History of hypertension.  6. History of hyperlipidemia.   CURRENT MEDICATIONS:  Include aspirin 81 mg q.d. but was stopped on October 05, 2003. Celebrex 200 mg q.d., Lopressor 50 mg b.i.d., Lescol 80 mg q.d.,  multivitamin q.d. and Tylenol q.d.   SOCIAL HISTORY:  Mr. Gartman lives in [place] with his wife. He is an  Print production planner. He has 2 children and several grand kids. He denies any  cigarette smoking and no alcohol use. No drug use is reported. He did smoke  but stopped after his CABG  earlier this year.   FAMILY HISTORY:  His father died at 46 years old of an aneurysm and a  stroke. His mother had COPD and died at an unknown age. He has several  brothers, 1 who had a heart attack and 1 brother with high blood pressure.  Two other brothers are alive and well without any known illnesses.   REVIEW OF SYSTEMS:  Mr. Gunderman complains of being very weak. He denies any  fevers, chills or weight loss. No night sweats. No bleeding prior to this  hospitalization. He denies any petechia. No epistaxis or bleeding gums. No  respiratory infection symptoms. No headaches, dizziness. No chest pain,  palpitations or shortness of breath. No abdominal pain, nausea and vomiting.  No constipation or diarrhea. No melena or hematochezia. No bladder symptoms  are reported.   PHYSICAL EXAMINATION:  VITAL SIGNS:  He had a temperature of 101.1 last  night but has been afebrile for the rest of the day. His blood pressure has  been ranging around 124/60. It did drop during the evening from about 94/56  in that range but otherwise, they have been stable. Oxygen saturations are  within normal limits on room air. Pulse and respirations are normal. He is  lying in bed  but has been up walking in the Intensive Care Unit with a  walker and assistance.  HEENT:  Unremarkable. PERRLA. EOMI. No icterus. Buccal mucosa is moist.  NECK:  Supple without adenopathy.  HEART:  Regular rate and rhythm.  Without murmur.  LUNGS:  Clear to auscultation without crackles, wheezes, rales, or rhonchi.  ABDOMEN:  Bowel sounds are positive, soft. He does have generalized  tenderness all throughout. No hepatosplenomegaly is palpable.  GENITOURINARY:  Examination is deferred.  RECTAL:  Deferred.  EXTREMITIES:  No edema, cyanosis or clubbing.  SKIN:  Without petechia. He does have several bruises, mostly on his  forearms and hands.   LABORATORY DATA:  On October 10, 2003 for preoperative, his white count was  9.2. Hemoglobin 15.2. Hematocrit 43.5. Platelet count was 79. On October 16, 2003 status post surgery white count was 18.7. Hemoglobin 9.2. Hematocrit  was 26.1. Platelet count was 37. Later on, after transfusion, white count  was 22.6. Hemoglobin 9.1. Hematocrit was 25.8 and platelet count was 33,000.  On October 17, 2003 white count was 8.6. Hemoglobin 6.7. Hematocrit 18.8.  Platelet count 53,000. Again, that was after a total of 2 bags of platelets.  On October 18, 2003 white count was 9.9. Hemoglobin 9.4. Hematocrit 26.4.  Platelet count 96,000. On October 19, 2003 hemoglobin and hematocrit was  8.6 and 25. Platelet count had gone up to 87,000. And today, October 20, 2003 white count is 7.8. Hemoglobin 8.6 and hematocrit 24.9. Platelet count  95,000. PT and INR are 14.8 and 1.2 respectively.   ASSESSMENT:  1. Chronic thrombocytopenia of uncertain etiology. Possibly due to     myelodysplastic syndrome or idiopathic thrombocytopenic purpura. Since he     had a good response to transfused platelets, idiopathic thrombocytopenic     purpura if less likely.  2. Coronary artery disease status post coronary artery bypass grafting. 3. Status post hemorrhagic shock with  improvement.  4. Four days status post right hip arthroplasty.  5. Hypertension.  6. Hyperlipidemia.  7. History of prostatitis.   RECOMMENDATIONS:  The patient was first examined by Dr. Arline Asp on October 19, 2003 and recommended the following: That there is no contraindication  for use  of Coumadin in this patient but Heparin is to be avoided. We will  continue to monitor his platelet counts closely. We suspect that the  decreased platelet count after the surgery is due to blood loss dilution and  utilization. As this patient recovers, then we will likely obtain  ultrasound of the abdomen to look for splenomegaly and will possibly perform  a bone marrow aspiration and biopsy at some point in the future.   Thank you for allowing [place] to see this patient.     Melony Overly, [place]                          Samul Dada, M.D.    TH/MEDQ  D:  10/20/2003  T:  10/20/2003  Job:  [id number]   cc:   Rosalyn Gess. Norins, M.D. [facility]   Willa Rough, M.D.

## 2011-04-25 NOTE — Consult Note (Signed)
NAME:  [name] [name], [name] [name]                        ACCOUNT NO.:  [id number]   MEDICAL RECORD NO.:  [id number]                   PATIENT TYPE:  INP   LOCATION:  4098                                 FACILITY:  [facility]   PHYSICIAN:  Vida Roller, M.D.                DATE OF BIRTH:  1941-07-30   DATE OF CONSULTATION:  10/16/2003  DATE OF DISCHARGE:                                   CONSULTATION   PRIMARY CARE PHYSICIAN:  Rosalyn Gess. Norins, M.D.   CARDIOLOGIST:  Willa Rough, M.D.   REFERRED BY:  Marlowe Kays, M.D.   REASON FOR CONSULTATION:  The patient is a 70 year old white male known to  [place] for his coronary artery disease, who came to [facility] today  for a right hip arthroplasty.  This was performed today in the operating  room.  She had essentially about a 1600 mL estimated blood loss, and was  doing well intraoperatively; however, postoperatively his blood pressure  began to drop over the course of a couple of hours, to as low as 60/40 with  a tachycardia in the range of 120 beats a minute.  He was treated with IV  fluids and 2 units of packed red blood cells.  He has experienced some  nausea and vomiting, but no frank chest discomfort.  He really has no  discomfort at all, other than his nausea.  He is awake and alert, and able  to respond to questions, and is a reasonably good historian.  Currently his blood pressure is 70/40, heart rate 120.  He is in  Trendelenburg, but still able to [facility].  He denies any shortness of  breath.  No chest discomfort.  No fullness in his chest.  He has no  significant extremity pain.  His abdomen is slightly tender to palpation.   PAST MEDICAL HISTORY:  1. Significant for coronary artery disease, status post heart     catheterization in April of this year, with three-vessel coronary     disease, preserved left ventricular function.  2. He underwent coronary artery bypass graft surgery in May of this year     with a LIMA to his  LAD, a saphenous vein graft to a first obtuse     marginal, saphenous vein graft to a diagonal, and saphenous vein graft     which is a jump of his posterior descending to his posterior lateral     branch.  3. He has a history of thrombocytopenia, which was evaluated in April 2004,     felt to be secondary to nonsteroidal use; however, he has not been re-     evaluated since then, and continues to be thrombocytopenic.  4. He has severe osteoarthritis, status post a knee replacement and the     recent hip replacement.  5. He has a history of prostatitis.  6. History of hypertension.  7. History of hyperlipidemia.  CURRENT MEDICATIONS:  1. Aspirin 81 mg daily, stopped on October 05, 2003.  2. Celebrex 200 mg daily.  3. Lopressor 50 mg b.i.d.  4. Lescol 80 mg daily.  5. Multivitamin daily.  6. Tylenol.   SOCIAL HISTORY:  He lives in [place] with his wife.  He is an Engineer, manufacturing.  He has two children, both of whom are healthy.  He does not smoke  cigarettes.  He stopped drinking in 1969.  He does not use any drugs.  He  was previously a smoker of one pack daily x50 years, but stopped after his  bypass surgery.   FAMILY HISTORY:  His father died at ae 53, of an aneurysm and a stroke.  His  mother died of chronic obstructive pulmonary disease, at an unknown age.  He  has one brother who had a heart attack and one brother with hypertension.  Two other brothers are alive and well, without significant disease.   REVIEW OF SYSTEMS:  Essentially noncontributory except for that mentioned in  the history and physical examination.   PHYSICAL EXAMINATION:  GENERAL:  He is a well-nourished, well-developed  white male, who is in mild distress, secondary to his nausea and dizziness.  VITAL SIGNS:  Pulse 120, blood pressure 80/63 in Trendelenburg, respirations  20.  He weighs 174 pounds.  HEENT:  Unremarkable examination.  NECK:  Supple.  No jugular venous distention or carotid  bruits.  CHEST:  Clear to auscultation bilaterally in the anterior portion.  I could  not roll him over, secondary to being in a leg and hip brace.  CARDIOVASCULAR:  Tachycardic, with no obvious murmurs.  ABDOMEN:  Soft, with mild diffuse tenderness.  No focal tenderness, no  rebound tenderness.  He has hypoactive bowel sounds.  There is no  hepatosplenomegaly.  GENITOURINARY:  Examination is deferred.  RECTAL:  Examination is deferred.  EXTREMITIES:  Without significant clubbing, cyanosis, or edema.  He has a  few areas of ecchymoses, primarily in his forearms and hands, which his wife  reports as being old and were present before the surgery.  His lower  extremities were not thoroughly examined, due to his hip brace; however, he  does have 1+ pulses in both lower extremities, both the dorsalis pedis and  posterior tibials.  NEUROLOGIC:  Examination is grossly nonfocal.  He has not had a chest x-ray.  His electrocardiogram preoperatively shows sinus rhythm at a rate of 74,  with a left axis deviation, old inferior Q-waves, normal intervals.   LABORATORY DATA:  Preoperatively his white blood cell count was 9.2, H&H at  15 and 43, with a platelet count of 79,000.  Sodium 138, potassium 3.9,  chloride 105, bicarbonate 27, BUN 14, creatinine 0.8, glucose 99.  Liver  function tests within normal limits.  Total protein was 7.5 and albumin 4.0.  His PTT was 33, PT 13.2, INR of 1.0.  Currently his most recent set of labs  show his white blood cell count to be 18.7, hemoglobin 9.2, hematocrit 26,  platelet count 37.  His INR is up to 1.6, PT 16.9, PTT 31.  His most recent  set of electrolytes are pending at present, as are the urine, the D-dimer  and a complete DIC panel.   ASSESSMENT:  This is a gentleman status post a total hip replacement  surgery, who appears to have hypovolemic impending shock, likely secondary to severe blood loss.  He has received 2 units of packed red blood cells,  with no change in his hematocrit of any significance.  He has significant  thrombocytopenia, and is now quite thrombocytopenic at 37,000.   RECOMMENDATIONS:  I am concerned about ongoing bleeding, and would recommend  an aggressive resuscitation be begun.  Will initially start with  crystalloid.  We have typed and crossed him for 4 units of blood.  I have  contacted Dr. Danice Goltz, my colleague in critical care medicine, and  have asked her to assist me in the  assessment of this patient.  From a cardiovascular standpoint, I do not  think there is any active ischemia going on.  He has recently been  revascularized successfully without any complications.  Hopefully, we can  get a handle on his bleeding, address his thrombocytopenia, and hopefully  successfully treat this hypovolemic shock.                                               Vida Roller, M.D.    JH/MEDQ  D:  10/16/2003  T:  10/17/2003  Job:  [id number]

## 2011-04-25 NOTE — Discharge Summary (Signed)
NAME:  [name] [name], [name] [name]                        ACCOUNT NO.:  [id number]   MEDICAL RECORD NO.:  [id number]                   PATIENT TYPE:  INP   LOCATION:  2012                                 FACILITY:  [facility]   PHYSICIAN:  Kerin Perna, M.D.               DATE OF BIRTH:  01/21/1941   DATE OF ADMISSION:  04/10/2003  DATE OF DISCHARGE:  04/15/2003                                 DISCHARGE SUMMARY   PRIMARY DIAGNOSIS:  Severe three-vessel coronary artery disease.   DISCHARGE DIAGNOSES:  1. Severe three-vessel coronary artery disease.  2. Class IV progressive angina.  3. Hypertension.  4. Hyperlipidemia.  5. Chronic prostatitis.  6. Chronic obstructive pulmonary disease.  7. Chronic thrombocytopenia.  8. History of previous myocardial infarction.   PROCEDURES:  1. Coronary artery bypass grafting x 5 (left internal mammary artery to the     LAD, saphenous vein graft to diagonal, saphenous vein graft to the first     obtuse marginal, sequential saphenous vein graft to the posterior     descending and posterolateral arteries).  2. Endoscopic vein harvest, right leg.   HISTORY:  The patient is a 70 year old male with history of coronary artery  disease.  He is status post previous myocardial infarction as well as stent  placement in 1993.  He has severe arthritis and was seen and evaluated for  hip replacement.  He underwent cardiology consultation including a stress  test.  Stress test was positive for ischemia.  It was recommended he undergo  cardiac catheterization, and this showed severe three-vessel coronary artery  disease.  It was not felt he would be a good candidate for percutaneous  intervention.  He was seen in consultation by Dr. Donata Clay, and it was felt  the patient would benefit from surgical revascularization.  After risks,  benefits, and alternatives of the procedure were explained to the patient,  it was agreed that he would return after a leave of absence of  04/10/2003  without proceed with surgery.   HOSPITAL COURSE:  The patient was admitted on May 3 and taken to the  operating room where he underwent CABG x 5 with above-noted grafts.  He  tolerated the procedure well.  He did require transfusion of platelets and  fresh frozen plasma as well as packed red blood cells in the operating room.  Postoperative platelet count was 40,000.  Of note, he does have a history of  chronic thrombocytopenia with preoperative platelet count around 70,000.   He was transferred to the SICU in stable condition.  He was extubated  shortly after surgery.  He was hemodynamically stable and doing well on  postop day #1.  He was started on Lasix for mild volume overload.  He was  mobilized by cardiac rehabilitation phase I.  He was kept in the unit for  further observation.  His platelet count has remained somewhat on the  low  side but has trended upward throughout his admission.  It has stabilized now  at 96,000.   He was transferred to the floor on postop day #2.  He has had some  difficulty with ambulation secondary to arthritis and joint disease in his  hips, but is otherwise doing fairly well.  He has remained in normal sinus  rhythm.  Surgical incision sites are healing well.  He has remained  afebrile, and other vital signs have been stable.  He is tolerating a  regular diet and having normal bowel and bladder function.  His hemoglobin  and hematocrit have remained relatively stable with hemoglobin 8.3, and  hematocrit 24.1.  All other lab values are within normal limits.  If is felt  that if he continues to remain stable, he will be ready for discharge home  on 04/15/2003.   DISCHARGE MEDICATIONS:  1. Enteric-coated aspirin 81 mg daily.  2. Lopressor 25 mg b.i.d.  3. Lescol 80 mg q.h.s.  4. Lasix 40 mg daily x 1 week.  5. K-Dur 20 mEq daily x 1 week.  6. Multivitamins daily.  7. Tylenol Extra Strength 1 to 2 q.6h. p.r.n. for pain.   DISCHARGE  INSTRUCTIONS:  1. He is to refrain from driving, heavy lifting, or strenuous activity.  2. He may continue low-fat, low-sodium diet.  3. He is asked to shower and clean incisions with soap and water.   FOLLOW UP:  He will see Dr. Myrtis Ser in the office in two weeks and have chest x-  ray at that visit.  He will then follow up wiht Dr. Donata Clay in three weeks  and is asked to bring his chest x-ray for Dr. Donata Clay to review.  He will  call our office if he experiences any problems or has questions in the  interim.     Coral Ceo, P.A.                        Kerin Perna, M.D.    GC/MEDQ  D:  04/14/2003  T:  04/17/2003  Job:  [id number]   cc:   Willa Rough, M.D.   Rosalyn Gess Norins, M.D. [facility]

## 2011-04-25 NOTE — Discharge Summary (Signed)
NAME:  [name] [name], [name] [name]                        ACCOUNT NO.:  [id number]   MEDICAL RECORD NO.:  [id number]                   PATIENT TYPE:  INP   LOCATION:  0470                                 FACILITY:  [facility]   PHYSICIAN:  Marlowe Kays, M.D.               DATE OF BIRTH:  1941/07/18   DATE OF ADMISSION:  10/16/2003  DATE OF DISCHARGE:  10/24/2003                                 DISCHARGE SUMMARY   ADDENDUM:  It was planned for [name] [name] to go to [facility] for his  total hip replacement arthroplasty rehabilitation.  However, his insurance  company did not cover any rehabilitation process for this patient as far  as an inpatient basis was concerned.  We therefore had to cancel the planned  and ordered rehabilitation for this patient and rely on home physical  therapy.  Hopefully this home physical therapy will suffice to return the  patient to a safe and comfortable level of life.  We were hoping that the  reinforcement by inpatient rehabilitation to prevent any dislocation  postoperatively of this hip, particularly in light of the fact that the  patient was weakened from his postoperative complications of hemorrhagic  shock and a week stay in intensive care unit could place him susceptible for  dislocation of his total hip replacement prosthesis.   I talked at the bed side with both the patient and his wife, [name] [name],  concerning the precautions for postoperative care of the right hip.  This,  of course, was reinforced by occupational therapy and physical therapy while  he was an inpatient on the regular floor.   His wound was dry, neurovascularly intact to the right lower extremity.  He  was in good spirits.  Samul Dada, M.D. will have him follow up in  his office about two weeks from discharge.  He has ordered a hematocrit and  hemoglobin along with a CBC with differential to be done on October 26, 2003 and results phoned to his office.  I wrote an order  covering this with  his home health company.   He will return to the office to see Marlowe Kays, M.D. approximately two  weeks after the date of surgery.  Continue his home medications and diet.   DISCHARGE MEDICATIONS:  1. Vicodin for discomfort.  2. Robaxin 500 mg for muscle spasm.  3. Coumadin for pharmacy protocol.  4. Ferrous sulfate iron supplement one daily.  5. He is not to continue aspirin and Celebrex including his home     medications.     Dooley L. Cherlynn June.                 Marlowe Kays, M.D.    DLU/MEDQ  D:  10/24/2003  T:  10/24/2003  Job:  [id number]   cc:   Samul Dada, M.D.  [address] [facility]  [place]  [place] [address]  Fax: [phone]   Willa Rough, M.D.   Vida Roller, M.D.  Fax: [phone]

## 2011-04-25 NOTE — Consult Note (Signed)
NAME:  [name] [name], [name] [name]                        ACCOUNT NO.:  [id number]   MEDICAL RECORD NO.:  [id number]                   PATIENT TYPE:  INP   LOCATION:  2037                                 FACILITY:  [facility]   PHYSICIAN:  Mikey Bussing, M.D.           DATE OF BIRTH:  1941/08/12   DATE OF CONSULTATION:  04/06/2003  DATE OF DISCHARGE:                                   CONSULTATION   CARDIOTHORACIC SURGICAL CONSULTATION:   PRIMARY CARE PHYSICIAN:  Rosalyn Gess. Norins, M.D. [facility].   REASON FOR CONSULTATION:  Severe three-vessel coronary artery disease with  class 3 angina.   CHIEF COMPLAINT:  Chest pain.   HISTORY OF PRESENT ILLNESS:  I was asked to evaluate this 70 year old male  for potential surgical coronary revascularization for recently diagnosed  severe three-vessel coronary artery disease.  The patient has a planned  right hip replacement by Dr. Marlowe Kays for severe degenerative  arthritis.  He has a past history of coronary disease status post coronary  stenting following a myocardial infarction in '93.  A recent Cardiolite  showed evidence of inferior ischemia and he has been having exertional left  shoulder pain and tightness relieved by rest.  Because of his pending hip  surgery he underwent cardiac catheterization yesterday and was admitted to  the hospital following the study.  He was found to have a 95% stenosis of  the mid right coronary, 90% stenosis of a large circumflex marginal, 50-60%  stenosis of the LAD and 90% stenosis of a large diagonal.  His left  ventricular function was fairly well preserved with some mild anterior  hypokinesia and an ejection fraction of 50%.  LEVDP was 10 mmHg.  The left  mammary artery was patent.  He is felt to be a candidate for surgical  coronary revascularization.   PAST MEDICAL HISTORY:  1. Osteoarthritis affecting the right hip, right knee with deep difficulty.  2. Allergy to PENICILLIN causing him to pass out.  3.  Hypertension.  4. Active cigarette smoking.  5. Hyperlipidemia.  6. Chronic prostatitis.   CURRENT MEDICATIONS:  Vioxx 25 mg daily, glucosamine 2 tablets daily, Altace  10 mg daily, Lescol 80 mg daily, aspirin 325 mg daily, and Lopressor 50 mg  b.i.d.   SOCIAL HISTORY:  This patient smokes one to ten cigarettes a day.  He denies  alcohol use.  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   FAMILY HISTORY:  Negative for diabetes or premature coronary disease.   REVIEW OF SYSTEMS:  CONSTITUTIONAL: He denies constitutional  symptoms of  fever, night sweats or weight loss.  ENT: Negative for change in vision,  difficulty swallowing or recent active dental problems.  PULMONARY: Positive  for some infrequent episodes of bronchitis for which he usually takes a Z-  Pack.  He denies any recent fever, productive  cough, sinus infection, or  previous abnormal chest x-ray or previous hemoptysis.  CARDIAC: Positive for  his exertional angina, negative for CHF, negative for syncope, negative for  ventricular arrhythmia.  Positive for myocardial infarction in '93.  GI:  Negative for abdominal pain and change in bowel habits, ulcer disease,  hepatitis or jaundice.  GU: Positive for nocturia, some difficulty voiding.  No history of prostate  cancer, hematuria.  VASCULAR: Negative for TIA, DVT, or claudication.  HEMATOLOGIC: Positive for history of thrombocytopenia with a platelet count  of 70,000 felt to be secondary to long-standing Vioxx use.  ENDOCRINE:  Negative for diabetes or thyroid disease.  NEUROLOGIC: Negative for seizure,  syncope or stroke.  MUSCULOSKELETAL: Positive for his arthritis, negative  for significant trauma to his chest or ribs.  He is right hand dominant.   PHYSICAL EXAMINATION:  VITAL SIGNS: He is 5 feet, 10 inches and weighs 175  pounds.  Blood pressure 110/70, pulse 68, respirations 18, temperature 97.8.  GENERAL APPEARANCE:  A pleasant, middle-aged white male in his hospital room  following cardiac catheterization in no distress.  HEENT: Normocephalic.  Full extraocular movements.  Pharynx clear, dentition  under adequate repair.  NECK: Without jugular venous distension, mass or thyromegaly or carotid  bruits.  LYMPHATICS: Reveal no palpable supraclavicular or axillary adenopathy.  LUNGS: Clear, there is no thoracic deformity.  CARDIAC: Regular rhythm without an S3 gallop, murmur or rub.  ABDOMEN: Soft, nontender, benign.  VASCULAR EXAM: Reveals 2+ pulses in the radial, femoral and pedal areas.  There is no venous insufficiency of the lower extremities.  MUSCULOSKELETAL: Review indicates previous arthroscopic surgery of the right  knee.  He does not have any clubbing, cyanosis or peripheral edema.  RECTAL EXAM: Deferred.  NEUROLOGIC EXAM: He is alert and oriented x3 with full motor function while  he is at bed rest currently.   LABORATORY DATA:  His coronary arteriograms were reviewed and indicate a  high-grade 95% proximal RCA lesion, a 90% stenosis of the diagonal, 80%  stenosis of the large circumflex marginal and 50-60% stenosis of his LAD.  His pre-CABG Dopplers show no significant carotid disease by duplex  ultrasound, ABI greater than 1.0 in each lower extremity and brachial artery  pressures are equal bilaterally.  Chest x-ray shows no active disease.  His  platelet count is 68,000 with hematocrit of 39%.   IMPRESSION:  1. Symptomatic three-vessel coronary disease with a positive stress-test.  2. Severe arthritis of his right hip which will require hip replacement.  3. Significant thrombocytopenia felt to be secondary to Vioxx use.  4. Active smoking.   PLAN:  The patient would benefit from surgical coronary revascularization  which will be scheduled for Monday, 04/10/03.  I reviewed the procedure,  indications and benefits as well as the risks with the patient and wife.  They understand his  risk for bleeding and blood transfusion requirement  (especially platelets), is increased due to his preoperative  thrombocytopenia.  They understand the alternatives to surgery therapy for  treatment of his coronary disease.  At this point the patient wishes to  proceed with surgery which will be scheduled with his consent on 04/10/03.                                                  Kathlee Nations Trigt III,  M.D.    PV/MEDQ  D:  04/06/2003  T:  04/07/2003  Job:  [id number]   cc:   Willa Rough, M.D.   Rosalyn Gess Norins, M.D. [facility]

## 2011-04-25 NOTE — Assessment & Plan Note (Signed)
[facility]                            CARDIOLOGY OFFICE NOTE   NAME:Mccollam, PAPA PIERCEFIELD                     MRN:          [id number]  DATE:12/14/2006                            DOB:          07/23/1941    Mr. Burgett is doing well.  He is not having any significant chest pain.  He underwent CABG in May 2004.  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   He goes about full activity.   PAST MEDICAL HISTORY:  Allergies:  PENICILLIN.   Medications:  1. Simvastatin 80 mg.  2. Lisinopril 10 mg.  3. Metoprolol 50 mg b.i.d.  4. Aspirin 81 mg.  5. Stool softener p.r.n.   Other medical problems:  See the list below.   REVIEW OF SYSTEMS:  He has no complaints and his review of systems is  negative otherwise than having mild viral illness recently.   PHYSICAL EXAMINATION:  VITAL SIGNS:  Weight is 174 pounds.  Blood  pressure 124/74 with a pulse of 73.  GENERAL:  The patient is oriented to person, time and place.  Affect is  normal.  HEENT:  There is no xanthelasma.  He has normal extraocular motion.  NECK:  He has no carotid bruits.  There is no jugular venous distention.  CARDIAC:  S1 with an S2.  There are no clicks or significant murmurs.  ABDOMEN:  Soft.  There are no masses or bruits.  EXTREMITIES:  He has no peripheral edema.  MUSCULOSKELETAL:  He has some kyphosis.   EKG shows right bundle branch block.  This is new.  There are no new Q  waves.  I explained this to the patient.   PROBLEMS:  1. Coronary disease post coronary artery bypass graft in 2004 with a      stable nuclear in 2007.  2. Ejection fraction 50%.  3. Dyslipidemia, on medication.  He is doing well on Zocor now.  4. Hypertension, treated.  5. History of thrombocytopenia, stable.  6. History of prostatitis.  7. PENICILLIN allergy.   He is  stable.  I will see him back in 6 months.     Luis Abed, MD, [facility]  Electronically Signed    JDK/MedQ  DD: 12/14/2006  DT: 12/15/2006  Job #: [id number]

## 2011-04-25 NOTE — Consult Note (Signed)
NAME:  ALMA, MUEGGE                        ACCOUNT NO.:  [id number]   MEDICAL RECORD NO.:  [id number]                   PATIENT TYPE:  OIB   LOCATION:  2037                                 FACILITY:  [facility]   PHYSICIAN:  Merilynn Finland, M.D.                DATE OF BIRTH:  10-28-1941   DATE OF CONSULTATION:  04/04/2003  DATE OF DISCHARGE:                                   CONSULTATION   REASON FOR CONSULTATION:  Thrombocytopenia.   REFERRED BY:  Rollene Rotunda, M.D.   HISTORY OF PRESENT ILLNESS:  The patient is a 70 year old gentleman with a  history of CAD/MI status post PCI 2, hyperlipidemia, hypertension, and  history of thrombocytopenia with platelet count of 102,000 in August 2002.  The patient was scheduled for cardiac catheterization 04/04/2003 prior to  proceeding with hip surgery.  He had an abnormal stress test and has been  having some left arm pain.  Lab work on 03/31/2003 showed hemoglobin 15.5,  white blood cell count 8.6, and platelets count 91,000.  Upon admission on  04/04/2003, the patient was found to have platelet count of 77,000.  This was  confirmed with a repeat platelet count of 74,000.  Cardiac catheterization  has been rescheduled for 04/05/2003.   PAST MEDICAL HISTORY:  1. Thrombocytopenia.  August 2002, platelet count 102,000.  2. CAD/MI status post PCI x 2.  3. Hypertension.  4. Hyperlipidemia.  5. Osteoarthritis.  6. History of torn medial meniscus status post right knee arthroscopy August     2002.   HOME MEDICATIONS:  Vioxx, aspirin, glucosamine, Altace, Lescol, and  metoprolol.   ALLERGIES:  PENICILLIN.   FAMILY HISTORY:  Mother deceased with possible COPD; father deceased with  CVA; brother has history of MI, pancreatitis; there is a brother with  hypertension, and two brothers are reported to be healthy.   SOCIAL HISTORY:  The patient lives in [place].  He is married.  He has  two children who are both healthy.  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  He reports tobacco use currently at one cigarette  per day with a 45-year smoking history.  He reports no ETOH since 1969.   REVIEW OF SYSTEMS:  The patient denies any weight loss or anorexia.  His  energy has been poor.  He denies any fever.  He has been having some night  sweats over the past three to four months.  He denies unusual headaches or  vision changes.  He wears glasses.  He denies shortness of breath or cough.  He denies any chest pain.  He has been experiencing some left arm pain.  He  denies peripheral edema.  He reports chronic constipation.  He denies any  rectal bleeding.  He has had no hematuria or dysuria but does report some  difficulty initiating his urine stream.  He denies any bleed.  The patient  does report that he bruises easily.   PHYSICAL EXAMINATION:  VITAL SIGNS:  Temperature 97.7, heart rate 59,  respirations 16, blood pressure 138/83.  GENERAL:  Pleasant Caucasian male in no acute distress.  HEENT:  Normocephalic and atraumatic.  Pupils are equal, round, and reactive  to light.  Extraocular movements intact.  Sclerae anicteric.  Oropharynx  clear.  LYMPH:  No palpable cervical, supraclavicular, axillary, or inguinal lymph  nodes.  CHEST:  Lungs clear bilaterally.  No wheezes or rales.  CARDIOVASCULAR:  Regular rate and rhythm.  ABDOMEN:  Soft and nontender.  No hepatosplenomegaly.  EXTREMITIES:  No cyanosis, clubbing, or edema.  No petechiae.  NEUROLOGIC:  Alert and oriented.  Moves all extremities.   LABORATORY DATA:  Hemoglobin 13.9, MCV 89.9, white blood cell count 8.6 (10%  eosinophils, 61% neutrophils, 23% lymphs, and 6% monos), platelet count  77,000.  Lab work August 2002: Hemoglobin 14.8, white count 8.9, platelets  102,000.   IMPRESSION:  The patient is a 70 year old gentleman admitted for cardiac  catheterization prior to hip surgery, found to have a platelet count of  77,000.  Review of  prior labs indicates a mild thrombocytopenia dating to  August 2002 at which time his platelet count was 102,000.  Dr. Katrinka Blazing  believes that this is primarily an effect of the Vioxx the patient has taken  for two years.  He doubts any problems from the catheterization tomorrow.  We will arrange for the patient to have followup as an outpatient next week.   The patient was seen and examined by Dr. Katrinka Blazing; the chart was reviewed.     Lonna Cobb, N.P.                         Merilynn Finland, M.D.    LT/MEDQ  D:  04/05/2003  T:  04/05/2003  Job:  [id number]

## 2011-04-25 NOTE — Discharge Summary (Signed)
NAME:  [name] [name], [name] [name]                        ACCOUNT NO.:  [id number]   MEDICAL RECORD NO.:  [id number]                   PATIENT TYPE:  INP   LOCATION:  2037                                 FACILITY:  [facility]   PHYSICIAN:  Willa Rough, M.D.                  DATE OF BIRTH:  26-Aug-1941   DATE OF ADMISSION:  04/04/2003  DATE OF DISCHARGE:  04/07/2003                                 DISCHARGE SUMMARY   BRIEF HISTORY:  This is a pleasant 70 year old male admitted to [facility] on 04/04/03 for evaluation of left  arm pain.  The patient was seen  by Dr. Myrtis Ser in the office on 03/31/03 for surgical clearance prior to hip  surgery.  Because of his recent symptoms of arm pain and previous history of  coronary artery disease, he was admitted to [facility] for further  evaluation.   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  He has a history of an MI with percutaneous  intervention of the circumflex in 1993 as well.  His ejection fraction was  estimated to be 45-50%  in the past.  He has abnormal lipids, hypertension,  history of tobacco use.   ALLERGIES:  PENICILLIN.   MEDICATIONS:  Medications prior to admission included Vioxx, glucosamine,  Altace, Lescol, aspirin, and metoprolol.   SOCIAL HISTORY:  The patient continues to smoke.   HOSPITAL COURSE:  As noted, this patient was admitted to [facility]  for further evaluation of arm pain prior to hip surgery.  The patient's  platelet count was found to be low and hematology consult was obtained, and  a workup was initiated.   The patient underwent cardiac catheterization on 04/05/03, performed by  Maisie Fus D. Riley Kill, M.D.  The patient was found to have diffuse three-vessel  coronary artery disease with an ejection fraction of 53%.  There was  hypokinesis of the inferior wall.   Coronary artery bypass graft surgery was  recommended.  Please see Dr. Rosalyn Charters complete dictated report for full  details.   The patient was seen in consultation by Kerin Perna, M.D.  Surgery was  scheduled for 04/10/03.  The patient requested discharge home to return for  his surgery, and arrangements were made to discharge the patient in stable  condition on 04/07/03.  A heparin-induced thrombocytopenia panel is pending  at the time of dictation.  Further follow-up of his thrombocytopenia was to  be performed on an out patient basis; however, it was recommended that he  not take any more Vioxx or aspirin until after his upcoming bypass surgery.   LABORATORY DATA:  A CBC on the day of discharge revealed hemoglobin 13.8,  hematocrit 40.1, WBC 10.5 thousand, platelets 67,000.  A CBC on 04/06/03  showed a platelet count of 68,000.  I do not see a chemistry panel in the  chart at this time.  This was probably performed in the office.  A heparin-  induced thrombocytopenia panel is pending.   A chest x-ray showed no active disease.   DISCHARGE MEDICATIONS:  The patient was told not to take Vioxx, glucosamine,  or aspirin until after his surgery.  He was to continue his Altace 10 mg  daily, Lescol 80 mg each evening, metoprolol 50 mg b.i.d., and nitroglycerin  p.r.n. for chest pain, Tylenol p.r.n. for pain.   DISCHARGE INSTRUCTIONS:  The patient was told to avoid any strenuous  activity or driving until after his surgery.  He was told not to lift more  than 10 pounds.  He was to be on a low-salt, low-fat diet.  He was told to  call the [place] office if he had any increased pain, swelling, or bleeding  from his groin.  He was to return for his surgery May 3 as instructed.  He  will see Dr. Donata Clay and Dr. Myrtis Ser at the time of his surgery.  He will  follow up with Merilynn Finland, M.D., the hematologist, on an outpatient  basis.  They will call him for an appointment.  He was told to  return to the  emergency room if he had any prolonged chest pain.  He was told to try to  quit smoking.   PROBLEM LIST AT TIME OF DISCHARGE:  1. Cardiac catheterization performed 04/05/03 revealing diffuse three-vessel     coronary artery disease, coronary artery bypass graft recommended,     ejection fraction 53%, hypokinesis of the inferior wall.  2. Degenerative joint disease of the hip with surgery indicated.  3. Previous history of coronary artery disease as documented above.  4. 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  5. Abnormal lipids.  6. History of hypertension.  7. History of tobacco use.  8. History of prostatitis.   ADDENDUM:  The patient was also given a prescription for Darvocet for his  hip pain, as his Vioxx is being discontinued.      Delton See, P.A. [facility]                  Willa Rough, M.D.    DR/MEDQ  D:  04/07/2003  T:  04/07/2003  Job:  [id number]

## 2011-04-25 NOTE — Op Note (Signed)
NAME:  [name] [name], [name] [name]                        ACCOUNT NO.:  [id number]   MEDICAL RECORD NO.:  [id number]                   PATIENT TYPE:  INP   LOCATION:  Z610                                 FACILITY:  [facility]   PHYSICIAN:  Marlowe Kays, M.D.               DATE OF BIRTH:  June 13, 1941   DATE OF PROCEDURE:  10/16/2003  DATE OF DISCHARGE:                                 OPERATIVE REPORT   PREOPERATIVE DIAGNOSIS:  Degenerative arthritis, right hip.   POSTOPERATIVE DIAGNOSIS:  Degenerative arthritis, right hip.   OPERATION:  Osteonics total hip replacement, right.   SURGEON:  Marlowe Kays, M.D.   ASSESSMENT:  [name] [name]. Darrelyn Hillock, M.D.   ANESTHESIA:  General.   INDICATIONS FOR PROCEDURE:  He had a complete loss of joint space and had  been cleared for surgery even though he had some thrombocytopenia with  preoperative platelet count being 75,000.   PROCEDURE:  Prophylactic antibiotics, general anesthesia, left lateral  decubitus position on the Mark-2 frame.  Right hip was prepped with  DuraPrep, draped as a sterile field.  Ioban employed.  Posterolateral  incision down to the fascia lata.  Cickel's band was cut.  External rotators  were detached from the femur with cutting cautery.  The gluteus medius was  preserved.  Hip capsule was identified and subtotal capsulectomy performed  and the hip dislocated.  I made my initial femoral neck cut just below the  femoral head and then cleared the piriformis fossa of soft tissue.  Following this, I placed a guide pin down into the femur overdrilling it,  and then using a canal finder.  I then began the cylindrical reaming process  working up to a #10 which was about what we had templated.  Along the way, I  used the greater trochanteric reamer and also made my final femoral neck cut  a fingerbreadth above the lesser trochanter.  I then began rasping up to a  #10, and this was the desired fit.  I then reamed for the distal tip, #16,  and went to the acetabulum where the capsulectomy was completed.  I then  began to deepen the expanding reamers working up to a 56.  I went through a  trial reduction with the hip being stable at this level.  Accordingly, I  went to the final Trident 58 cup which I secured with two superior screws,  each one being drilled, measured, and screwed.  I then went through another  trial reduction with a +10 liner at roughly 10 o'clock, and with a +0 neck,  the hip was nice and stable with a 36-mm head.  Accordingly, we went ahead  and placed the final 10-degree liner with a scribe liner at about 10 o'clock  and returned to the femur where the final Secure Fit Plus size 10 femoral  component with a 16 tip was gently impacted, and we then went through  a  final trial reduction with a +0 neck and found this to be the appropriate  size.  Accordingly, we placed the +0 C-tapered head, 36 mm size femoral  component, impacted it, reduce the hip and found it to be nice and stable in  all directions.  The wound was then well irrigated with double antibiotic  solution.  Cickel's band was reconstituted with interrupted #1 Vicryl, the  fascia lata with the same, deep subcutaneous tissue with the same, 2-0  Vicryl in the superficial subcutaneous tissue, and staples in the skin.  Betadine, Adaptic, and dry sterile dressing was applied.  He was placed on  his in an abduction pillow and taken to the recovery room in satisfactory  condition with no evidence of complications.  The EBL was roughly 1650 cc.  No blood replacement.  He started with  hemoglobin of 15.                                               Marlowe Kays, M.D.    JA/MEDQ  D:  10/16/2003  T:  10/16/2003  Job:  [id number]

## 2011-04-25 NOTE — Discharge Summary (Signed)
NAME:  [name] [name], [name] [name]                        ACCOUNT NO.:  [id number]   MEDICAL RECORD NO.:  [id number]                   PATIENT TYPE:  INP   LOCATION:  0470                                 FACILITY:  [facility]   PHYSICIAN:  Marlowe Kays, M.D.               DATE OF BIRTH:  02-24-1941   DATE OF ADMISSION:  10/16/2003  DATE OF DISCHARGE:  10/23/2003                                 DISCHARGE SUMMARY   ADMISSION DIAGNOSES:  1. Severe osteoarthritis of the right hip.  2. Thrombocytopenia.  3. Hypertension.  4. Coronary artery disease, status post coronary artery bypass graft.   DISCHARGE DIAGNOSES:  1. Status post hemorrhagic shock.  2. Severe osteoarthritis of the right hip.  3. Thrombocytopenia.  4. Hypertension.  5. Hyperlipidemia.  6. Coronary artery disease, status post coronary artery bypass graft.   CONSULTATIONS:  1. Danice Goltz, M.D.  2. Charlaine Dalton. Sherene Sires, M.D.  3. Vida Roller, M.D.  4. Samul Dada, M.D.   PROCEDURE:  On October 16, 2003, the patient underwent Osteonics total hip  replacement arthroplasty to the right hip, Ronald A. Gioffre, M.D. assisted.   HISTORY OF PRESENT ILLNESS:  This 70 year old, white male with progressive  degenerative joint disease of his right hip had been treated with  conservative measures, nonsteroidal anti-inflammatories, rest and some  exercises.  X-rays show severe degenerative changes.  We thought he would  benefit from surgery intervention and was admitted for the above procedure.  He had been cleared preoperatively by his medical physicians for his  surgery.   HOSPITAL COURSE:  The patient tolerated the surgical procedure quite well,  however, it was extremely difficult to maintain his blood pressure.  He also  had a drop in his platelets which was expected, however, it was quite  severe.  We admitted the patient to the intensive care unit.  Dr. Council Mechanic,  of the anesthesia department, also followed him very  closely.  We had  anticipated transfusing the patient with packed cells as well as platelets.  We contacted Dr. Dorethea Clan, who was on call for cardiology.  Due to his  patient's hypovolemia and low blood count/pressure, Dr. Danice Goltz, of  critical care medicine and pulmonology was called.  She placed an central  venous line and began transfusing the patient with packed cells.  His  hemoglobin varied dropping as low as 6.7.  Dr. Jabier Gauss. Wert/Dr.  Antoine Poche continued to follow the patient throughout his hospitalization.  The patient eventually stabilized as far as his hematocrit and hemoglobin  were concerned.  On the day of anticipated transfer to rehabilitation/SACU,  the patient's hemoglobin was 10.2, hematocrit was 29.3 and platelets were  185.  He was remarkably improved and on the regular hospital floor.  He had  some physical therapy which he tolerated well.  His appetite had returned  and he was voiding and was positive for stool.   Dr. Arline Asp  saw the patient on October 20, 2003, and was pleased with the  stabilization of platelets.  He ordered FeSO4 and the Trinsicon he had been  on previously was discontinued.   We anticipated a week or so stay with continuing inpatient rehabilitation  program.  Again, we felt this was necessary rather than go home with home  health due to the fact the patient had little to no therapy other than just  some occasional transfer while he was in the intensive care unit and his  post total hip replacement arthroplasty rehabilitation actually began some  five to six days after surgery progressing slowly.   On the day of discharge, he was neurovascularly intact to the right lower  extremity.  His wife, [name] [name], was sitting in the room with him.  He was  feeling better.   LABORATORY DATA AND X-RAY FINDINGS:  Preoperative CBC completely within  normal limits.  Hemoglobin 15.2, hematocrit 43.5, platelets 79, which was  obviously down.  The lowest  his hemoglobin dropped during his  hospitalization was 6.7, hematocrit 18.8.  Lowest platelets got was 33  directly postop.  His blood chemistries actually remained normal throughout  his entire hospitalization with sodium of 138, potassium 3.9 preoperatively  and postop, it was 136 and 3.5 respectively.  CK was expected high at 247,  CK-MB was also elevated at 5.2 with possible myocardial ischemia.  CK 354.  Urinalysis was tested and showed no hemolysis and was negative.  Troponin I  was 0.56 and 0.83 respectively on October 17, 2003, at 5:55 a.m. and 1400  hours.   EKG continued to show normal sinus rhythm.  There was a deviation of the  superior axis to the right.  This, however, was fairly close to his  preoperative EKG.  Preoperative chest x-ray done in May 2004, showed slight  improved bibasilar atelectasis.  No x-rays were done postop, other than the  preoperative hip which showed advanced osteoarthritic changes.   CONDITION ON DISCHARGE:  Improved and stable.   DISPOSITION:  The patient is transferred to [facility]  when bed is available.  He is to continue on his medications he is on now.  Any changes in his medications or questions concerning his thrombocytopenia,  cardiac status, etc., should be directed to the consulting physicians.   ACTIVITY:  Continue with touchdown weightbearing status of his right lower  extremity.   SPECIAL INSTRUCTIONS:  Staples out in 2-1/2 weeks.  He will have Steri-  Strips p.r.n.     Dooley L. Cherlynn June.                 Marlowe Kays, M.D.    DLU/MEDQ  D:  10/23/2003  T:  10/23/2003  Job:  [id number]   cc:   Samul Dada, M.D.  [address] [facility]  [place]  [place] [address]  Fax: [phone]   Vida Roller, M.D.  Fax: [phone]   Willa Rough, M.D.   Danice Goltz, M.D. [facility]   Charlaine Dalton. Sherene Sires, M.D. [facility]

## 2011-04-25 NOTE — Op Note (Signed)
NAME:  [name] [name], [name] [name]                        ACCOUNT NO.:  [id number]   MEDICAL RECORD NO.:  [id number]                   PATIENT TYPE:  OIB   LOCATION:  2314                                 FACILITY:  [facility]   PHYSICIAN:  Mikey Bussing, M.D.           DATE OF BIRTH:  May 18, 1941   DATE OF PROCEDURE:  04/10/2003  DATE OF DISCHARGE:                                 OPERATIVE REPORT   PREOPERATIVE DIAGNOSIS:  Class 4 progressive angina with severe three-vessel  coronary disease.   POSTOPERATIVE DIAGNOSIS:  Class 4 progressive angina with severe three-  vessel coronary disease.   OPERATION PERFORMED:  Coronary artery bypass grafting times five (left  internal mammary artery to the left anterior descending, saphenous vein  graft to the diagonal, saphenous vein graft to OM1, sequential saphenous  vein graft to posterior descending and posterolateral branch of the right  coronary artery).   SURGEON:  Kerin Perna, M.D.   ASSISTANT:  Maple Mirza, P.A.   ANESTHESIA:  General.   INDICATIONS FOR PROCEDURE:  The patient is a 70 year old smoker who  presented with acute onset chest pain and a positive stress test.  He has  severe arthritis and was being evaluated for a hip replacement and a stress  test was positive for ischemia.  Subsequent cardiac catheterization by  Willa Rough, M.D. and Arturo Morton. Riley Kill, M.D. demonstrated severe three-  vessel coronary disease and surgical revascularization was recommended.  His  platelet count was low in the 60 to 70,000 range and hematology consultant  felt it was due to his longstanding use of Vioxx.  He was felt to be an  acceptable candidate for surgical coronary revascularization and I saw the  patient in consultation in his hospital room following cardiac  catheterization.  I reviewed with the patient and his wife the indications  and expected benefits of coronary artery bypass grafting surgery for  treatment of his severe  coronary disease.  We discussed the alternatives to  surgical therapy as well.  I reviewed with the patient the major aspects of  the proposed operation including the choice of conduit for grafting, the  location of the surgical incisions, the use of general anesthesia and  cardiopulmonary bypass, and the expected postoperative hospital recovery.  I  discussed with the patient the risks to  him of coronary artery bypass  surgery including the risks of MI, CVA, wound infection, and death.  He  understood that he is at increased risk for bleeding and blood transfusion  requirement due to his preoperative thrombocytopenia.  He understood these  implications for the surgery and agreed to proceed with the operation as  planned under what I felt was an informed consent.   OPERATIVE FINDINGS:  The distal coronaries in the right coronary  distribution were severely diseased.  The mammary artery was small but had  adequate flow.  The saphenous vein was of average to  below average quality  and was harvested using endoscopic vein technique from the right thigh.  The  anterior wall had some subendocardial scarring.  Platelet transfusion as  well as blood transfusion and fresh frozen plasma was given in the operating  room for platelet count that dropped to 40,000.  Hemoglobin was 7.5, and  post bypass coagulopathy.   DESCRIPTION OF PROCEDURE:  The patient was brought to the operating room and  placed supine on the operating table where general anesthesia was induced  under invasive hemodynamic monitoring.  The chest, abdomen and legs were  prepped with Betadine and draped as a sterile field.  A sternal incision was  made as the saphenous vein was harvested from the right leg using the  endoscopic vein technique.  The left internal mammary artery was harvested  as a pedicle graft from its origin at the subclavian vessels and was an  adequate 1.5 to 2.0 mm vessel with good flow.  Heparin was  administered and  the ACT was documented as being therapeutic.  Through pursestrings placed in  the ascending aorta and right atrium, the patient was cannulated and placed  on bypass and cooled to 32 degrees.  The coronaries were identified for  grafting and the mammary artery and vein grafts were prepared for the distal  anastomoses.  A cardioplegia cannula was placed and the patient was cooled  to 30 degrees.  As the aortic cross-clamp was applied of cold blood  cardioplegia was delivered to the aortic root with good cardioplegic arrest  and septal temperature dropping less than 12 degrees.  Topical iced saline  slush was used to augment myocardial preservation and a pericardial  insulator pad was used to protect the left phrenic nerve.   The distal coronary anastomoses were then performed.  The first distal  anastomosis was to the diagonal.  This was a 1.5 mm vessel with proximal 95%  stenosis.  A reversed saphenous vein was sewn end-to-side with running 7-0  Prolene.  The second distal anastomosis was to the OM1.  This was a 1.7 mm  vessel with proximal 80 to 90% stent stenosis.  A reversed saphenous vein  was sewn end-to-side with running 7-0 Prolene with good flow through the  graft.  The third and fourth distal anastomoses consisted of a sequential  vein graft to the posterior descending and posterolateral.  The posterior  descending was a 1.5 mm vessel and heavily diseased with a proximal 95%  stenosis.  A side-to-side anastomosis to the vein was sewn using a running 7-  0 Prolene.  A fourth distal anastomosis was the continuation of the  sequential vein graft to the posterolateral.  This was a smaller 1.2 mm  vessel that had approximately 80 to 90% stenosis.  The end of the vein was  end-to-side with running 7-0 Prolene suture and there was good flow through  the graft.  Cardioplegia was redosed.  The fifth distal anastomosis was to the distal third of the LAD which was a  1.8 mm vessel with proximal 75%  stenosis.  The left internal mammary artery pedicle was brought through an  opening created in the left lateral pericardium and was brought down onto  the LAD and sewn end-to-side with running 8-0 Prolene.  There was good flow  through the anastomosis with immediate rise in septal temperature after  release of the pedicle clamp on the mammary artery.  The mammary pedicle was  secured to the epicardium and the  aortic cross-clamp was removed.   The heart resumed a spontaneous rhythm.  Using a partial occlusion clamp  three proximal vein anastomoses were placed on the ascending aorta using a  4.0 mm punch and a running 6-0 Prolene.  The partial clamp was removed and  the vein grafts were perfused.  Each had good flow and hemostasis was  documented at the proximal and distal anastomoses.  The patient was rewarmed  and reperfused.  Temporary pacing wires were applied.  The lungs were re-  expanded and the ventilator was resumed.  The patient was weaned from bypass  without difficulty without inotropes.  Cardiac output and  blood pressure  were stable following weaning from cardiopulmonary bypass.  Protamine was  administered and the cannulas were removed.  There was no adverse reaction  to the protamine.  The mediastinum was irrigated with warm antibiotic  irrigation.  The leg incision was irrigated and closed in a standard  fashion.  The pericardium was loosely reapproximated superiorly over the  aorta and vein grafts.  Two mediastinal and a left pleural chest tube were  placed and brought out through separate incisions.  The sternum was  reapproximated with interrupted steel wire.  The pectoralis fascia was  closed with a running #1 Vicryl.  The subcutaneous and skin were closed with  running Vicryl.  Total bypass time was 155 minutes with aortic cross-clamp  time of 90 minutes.                                                Mikey Bussing, M.D.     PV/MEDQ  D:  04/10/2003  T:  04/10/2003  Job:  [id number]   cc:   [facility]

## 2011-04-25 NOTE — Op Note (Signed)
[facility]  Patient:    [name] [name], [name] [name]                     MRN: [id number] Proc. Date: 07/20/01 Adm. Date:  [id number] Attending:  Marlowe Kays Page                           Operative Report  PREOPERATIVE DIAGNOSIS:  Torn medial meniscus, right knee.  POSTOPERATIVE DIAGNOSES: 1. Torn medial meniscus. 2. Osteoarthritis right knee.  OPERATION: 1. Right knee arthroscopy with one partial medial meniscectomy. 2. Extensive debridement medial and lateral femoral condyles with removal of    loose cartilaginous fragments.  SURGEON:  Illene Labrador. Aplington, M.D.  ASSISTANT:  Nurse.  ANESTHESIA:  General.  PATHOLOGY AND JUSTIFICATION FOR PROCEDURE:  Long history of progressive pain in right knee with MRI demonstrating probable posterior horn tear of the medial meniscus with failure to respond to anti-inflammatory medications, rest, and time.  He is here today for the above-mentioned surgery.  See operative description below for additional details and pathology.  DESCRIPTION OF PROCEDURE:  Satisfactory general anesthesia, pneumatic tourniquet, thigh stabilizer.  The right knee was prepped with Dura-Prep and draped in a sterile field.  Superior medial saline inflow.  First through an anteromedial port, the lateral compartment of knee joint was evaluated. Immediately apparent was some loose fragments of cartilaginous articular cartilage floating in the joint.  The site of origin appeared to be the posterior weightbearing portion of the lateral femoral condyle with a large divot out and also on the posterior portion, a large, partially detached segment of question.  After picturing with pathology, I used some baskets to debride back the perimeter of the crater including the large, partially detached cushion.  Final pictures were taken.  The lateral meniscus was then visualized, and it had a little bit of fraying but was basically intact and did not  require any surgical treatment.  His ACL was intact.  Looking up in the lateral gutter and suprapatellar areas, the patella was generally worn with very little articular cartilage left in most areas but nothing that I really felt would be improved with arthroscopic shaving.  I then reversed portals.  Medially, I found several fragments in the joint which could have come from the lateral joint.  These were removed with pituitary rongeur.  He had considerable wear of the medial femoral condyle, almost down to bone, and this was pictured and shaved down until smooth.  The medial meniscus had a degenerated type tear at the posterior curve which I trimmed back to stable rim with baskets and shaved down until smooth with the 3.5 shaver.  The knee joint was then irrigated until clear and all fluid possible removed.  The two anterior portals were closed with 4-0 nylon.  Then, 20 cc 0.5% Marcaine with adrenalin, 4 mg morphine were then instilled through the inflow apparatus which was removed, and this portal closed with 4-0 nylon as well.  Betadine and Adaptic dry sterile dressing were applied.  Tourniquet was released.  He tolerated the procedure well and was taken to the recovery room in satisfactory condition with no known complications. DD:  07/20/01 TD:  07/20/01 Job: [id number]

## 2011-06-19 ENCOUNTER — Other Ambulatory Visit (HOSPITAL_COMMUNITY): Payer: Self-pay | Admitting: Oncology

## 2011-06-19 ENCOUNTER — Encounter (HOSPITAL_BASED_OUTPATIENT_CLINIC_OR_DEPARTMENT_OTHER): Payer: Medicare PPO | Admitting: Oncology

## 2011-06-19 DIAGNOSIS — D472 Monoclonal gammopathy: Secondary | ICD-10-CM

## 2011-06-19 DIAGNOSIS — D696 Thrombocytopenia, unspecified: Secondary | ICD-10-CM

## 2011-06-19 LAB — CBC WITH DIFFERENTIAL/PLATELET
BASO%: 0.3 % (ref 0.0–2.0)
Eosinophils Absolute: 0.7 10*3/uL — ABNORMAL HIGH (ref 0.0–0.5)
LYMPH%: 21.6 % (ref 14.0–49.0)
MCHC: 34.6 g/dL (ref 32.0–36.0)
MONO#: 0.5 10*3/uL (ref 0.1–0.9)
NEUT#: 6 10*3/uL (ref 1.5–6.5)
Platelets: 80 10*3/uL — ABNORMAL LOW (ref 140–400)
RBC: 4.73 10*6/uL (ref 4.20–5.82)
RDW: 13.6 % (ref 11.0–14.6)
WBC: 9.1 10*3/uL (ref 4.0–10.3)
lymph#: 2 10*3/uL (ref 0.9–3.3)

## 2011-06-20 LAB — COMPREHENSIVE METABOLIC PANEL
ALT: 21 U/L (ref 0–53)
Albumin: 4.5 g/dL (ref 3.5–5.2)
CO2: 25 mEq/L (ref 19–32)
Chloride: 105 mEq/L (ref 96–112)
Glucose, Bld: 91 mg/dL (ref 70–99)
Potassium: 4.5 mEq/L (ref 3.5–5.3)
Sodium: 140 mEq/L (ref 135–145)
Total Bilirubin: 0.5 mg/dL (ref 0.3–1.2)
Total Protein: 7.5 g/dL (ref 6.0–8.3)

## 2011-06-20 LAB — LACTATE DEHYDROGENASE: LDH: 123 U/L (ref 94–250)

## 2011-06-20 LAB — IGG, IGA, IGM: IgG (Immunoglobin G), Serum: 1250 mg/dL (ref 650–1600)

## 2011-06-30 ENCOUNTER — Encounter: Payer: Self-pay | Admitting: *Deleted

## 2011-06-30 ENCOUNTER — Telehealth: Payer: Self-pay | Admitting: *Deleted

## 2011-06-30 ENCOUNTER — Emergency Department (HOSPITAL_COMMUNITY)
Admission: EM | Admit: 2011-06-30 | Discharge: 2011-06-30 | Disposition: A | Discharge status: Home / Self Care | Payer: Medicare PPO | Attending: Emergency Medicine | Admitting: Emergency Medicine

## 2011-06-30 DIAGNOSIS — I252 Old myocardial infarction: Secondary | ICD-10-CM | POA: Insufficient documentation

## 2011-06-30 DIAGNOSIS — I10 Essential (primary) hypertension: Secondary | ICD-10-CM | POA: Insufficient documentation

## 2011-06-30 DIAGNOSIS — M545 Low back pain, unspecified: Secondary | ICD-10-CM | POA: Insufficient documentation

## 2011-06-30 DIAGNOSIS — F17200 Nicotine dependence, unspecified, uncomplicated: Secondary | ICD-10-CM | POA: Insufficient documentation

## 2011-06-30 DIAGNOSIS — E785 Hyperlipidemia, unspecified: Secondary | ICD-10-CM

## 2011-06-30 DIAGNOSIS — M79609 Pain in unspecified limb: Secondary | ICD-10-CM | POA: Insufficient documentation

## 2011-06-30 DIAGNOSIS — Z951 Presence of aortocoronary bypass graft: Secondary | ICD-10-CM | POA: Insufficient documentation

## 2011-06-30 DIAGNOSIS — IMO0002 Reserved for concepts with insufficient information to code with codable children: Secondary | ICD-10-CM | POA: Insufficient documentation

## 2011-06-30 DIAGNOSIS — E7800 Pure hypercholesterolemia, unspecified: Secondary | ICD-10-CM | POA: Insufficient documentation

## 2011-06-30 NOTE — Telephone Encounter (Signed)
Pt due in for Lipid/Liver panel, letter mailed to pt to call office and sch appt, orders placed

## 2011-08-21 ENCOUNTER — Other Ambulatory Visit (INDEPENDENT_AMBULATORY_CARE_PROVIDER_SITE_OTHER): Payer: Medicare PPO

## 2011-08-21 DIAGNOSIS — E785 Hyperlipidemia, unspecified: Secondary | ICD-10-CM

## 2011-08-21 LAB — HEPATIC FUNCTION PANEL
ALT: 22 U/L (ref 0–53)
Albumin: 4.1 g/dL (ref 3.5–5.2)
Total Bilirubin: 0.8 mg/dL (ref 0.3–1.2)
Total Protein: 7.2 g/dL (ref 6.0–8.3)

## 2011-08-21 LAB — LIPID PANEL
Cholesterol: 128 mg/dL (ref 0–200)
LDL Cholesterol: 64 mg/dL (ref 0–99)
Triglycerides: 104 mg/dL (ref 0.0–149.0)

## 2011-10-10 ENCOUNTER — Encounter: Payer: Self-pay | Admitting: Cardiology

## 2011-10-10 DIAGNOSIS — E785 Hyperlipidemia, unspecified: Secondary | ICD-10-CM | POA: Insufficient documentation

## 2011-10-10 DIAGNOSIS — Z951 Presence of aortocoronary bypass graft: Secondary | ICD-10-CM | POA: Insufficient documentation

## 2011-10-10 DIAGNOSIS — D696 Thrombocytopenia, unspecified: Secondary | ICD-10-CM | POA: Insufficient documentation

## 2011-10-10 DIAGNOSIS — I10 Essential (primary) hypertension: Secondary | ICD-10-CM | POA: Insufficient documentation

## 2011-10-10 DIAGNOSIS — I2510 Atherosclerotic heart disease of native coronary artery without angina pectoris: Secondary | ICD-10-CM | POA: Insufficient documentation

## 2011-10-10 DIAGNOSIS — M549 Dorsalgia, unspecified: Secondary | ICD-10-CM | POA: Insufficient documentation

## 2011-10-10 DIAGNOSIS — I452 Bifascicular block: Secondary | ICD-10-CM | POA: Insufficient documentation

## 2011-10-10 DIAGNOSIS — R9430 Abnormal result of cardiovascular function study, unspecified: Secondary | ICD-10-CM | POA: Insufficient documentation

## 2011-10-13 ENCOUNTER — Encounter: Payer: Self-pay | Admitting: Cardiology

## 2011-10-14 ENCOUNTER — Ambulatory Visit: Payer: Medicare PPO | Admitting: *Deleted

## 2011-10-14 ENCOUNTER — Ambulatory Visit (INDEPENDENT_AMBULATORY_CARE_PROVIDER_SITE_OTHER): Payer: Medicare PPO | Admitting: Cardiology

## 2011-10-14 ENCOUNTER — Encounter: Payer: Self-pay | Admitting: Cardiology

## 2011-10-14 DIAGNOSIS — I2510 Atherosclerotic heart disease of native coronary artery without angina pectoris: Secondary | ICD-10-CM

## 2011-10-14 DIAGNOSIS — I10 Essential (primary) hypertension: Secondary | ICD-10-CM

## 2011-10-14 DIAGNOSIS — F17200 Nicotine dependence, unspecified, uncomplicated: Secondary | ICD-10-CM

## 2011-10-14 NOTE — Patient Instructions (Signed)
Your physician recommends that you schedule a follow-up appointment in: 12 months with Dr Katz 

## 2011-10-14 NOTE — Assessment & Plan Note (Signed)
Coronary Artery disease stable.  No change in therapy.

## 2011-10-14 NOTE — Progress Notes (Signed)
HPI Patient is seen today for followup coronary artery disease.  I saw him last November, 2011.  He underwent CABG in 2004.  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  That study showed an ejection fraction of 55%.  There was no significant ischemia.Is not having any significant chest pain or shortness of breath.  As part of today's evaluation I have reviewed my old records carefully updated new electronic medical record. Allergies  Allergen Reactions  . Penicillins     Current Outpatient Prescriptions  Medication Sig Dispense Refill  . aspirin 81 MG tablet Take 81 mg by mouth daily.        . Chlorphen-Phenyleph-APAP (CORICIDIN D COLD/FLU/SINUS) 2-5-325 MG TABS Take by mouth as needed.        . diazepam (VALIUM) 10 MG tablet Take 10 mg by mouth every 6 (six) hours as needed.        . docusate sodium (COLACE) 100 MG capsule Take 100 mg by mouth 2 (two) times daily.        Marland Kitchen lisinopril (PRINIVIL,ZESTRIL) 10 MG tablet Take 10 mg by mouth daily.        . metoprolol (LOPRESSOR) 50 MG tablet Take 50 mg by mouth 2 (two) times daily.        . Multiple Vitamins-Minerals (CENTRUM SILVER ULTRA MENS PO) Take by mouth daily.        Marland Kitchen omeprazole (PRILOSEC) 20 MG capsule Take 20 mg by mouth daily.        . simvastatin (ZOCOR) 40 MG tablet Take 40 mg by mouth at bedtime.        . Tamsulosin HCl (FLOMAX) 0.4 MG CAPS 0.4 tablets as needed.      . traMADol (ULTRAM) 50 MG tablet Take 50 mg by mouth every 6 (six) hours as needed. Maximum dose= 8 tablets per day         History   Social History  . Marital Status: Married    Spouse Name: N/A    Number of Children: N/A  . Years of Education: N/A   Occupational History  . Not on file.   Social History Main Topics  . Smoking status: Current Some Day Smoker  . Smokeless tobacco: Not on file  . Alcohol Use: Not on file  . Drug Use: Not on file  . Sexually Active: Not on file    Other Topics Concern  . Not on file   Social History Narrative  . No narrative on file    No family history on file.  Past Medical History  Diagnosis Date  . RBBB (right bundle branch block with left anterior fascicular block)     Intermittant  . Thrombocytopenia     Dr. Arline Asp  . Hypertension   . Dyslipidemia   . CAD (coronary artery disease)     nuclear 08/2008, no ischemia, old apical scar, EF 55%  . Ejection fraction     EF 55%, nuclear 2009  . Hx of CABG     2004  . Back pain     10/2010  . RBBB (right bundle branch block with left anterior fascicular block)   . HTN (hypertension)   . Hx of coronary artery bypass graft 2004  . CAD (coronary artery disease)     No past surgical history on file.  ROS  Patient denies fever, chills, headache, sweats, rash, change in vision, change in hearing, chest  pain, cough, nausea vomiting, urinary symptoms.  All other systems are reviewed and are negative. PHYSICAL EXAM Patient walks with a cane.  He is oriented to person time and place.  Affect is normal.  Head is atraumatic.  There is no jugular venous distention.  Lungs are clear.  Respiratory effort is unlabored.  Cardiac exam reveals S1 and S2.  No clicks or significant murmurs.  The abdomen is soft.  There is no peripheral edema.  No skin rashes. Filed Vitals:   10/14/11 0948  BP: 122/90  Pulse: 76  Height: 5\' 10"  (1.778 m)  Weight: 161 lb (73.029 kg)    EKG is done today and reviewed by me.  He has sinus rhythm.  There is old right bundle branch block.  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  There is no significant change in EKG.  ASSESSMENT & PLAN

## 2011-10-14 NOTE — Assessment & Plan Note (Signed)
Blood pressure is treated. No change in therapy. 

## 2011-10-14 NOTE — Assessment & Plan Note (Signed)
Patient is counseled to stop smoking. 

## 2011-10-23 ENCOUNTER — Other Ambulatory Visit: Payer: Self-pay | Admitting: Cardiology

## 2011-11-11 ENCOUNTER — Ambulatory Visit (INDEPENDENT_AMBULATORY_CARE_PROVIDER_SITE_OTHER): Payer: Medicare PPO | Admitting: Internal Medicine

## 2011-11-11 ENCOUNTER — Encounter: Payer: Self-pay | Admitting: Internal Medicine

## 2011-11-11 DIAGNOSIS — Z0000 Encounter for general adult medical examination without abnormal findings: Secondary | ICD-10-CM

## 2011-11-11 DIAGNOSIS — F17200 Nicotine dependence, unspecified, uncomplicated: Secondary | ICD-10-CM

## 2011-11-11 DIAGNOSIS — K3189 Other diseases of stomach and duodenum: Secondary | ICD-10-CM

## 2011-11-11 DIAGNOSIS — I2510 Atherosclerotic heart disease of native coronary artery without angina pectoris: Secondary | ICD-10-CM

## 2011-11-11 DIAGNOSIS — Z23 Encounter for immunization: Secondary | ICD-10-CM

## 2011-11-11 DIAGNOSIS — R1013 Epigastric pain: Secondary | ICD-10-CM

## 2011-11-11 MED ORDER — TAMSULOSIN HCL 0.4 MG PO CAPS: 0.1600 mg | ORAL_CAPSULE | ORAL | Status: DC

## 2011-11-11 NOTE — Progress Notes (Signed)
Subjective:    Patient ID: [name] [name], male    DOB: Nov 12, 1941, 70 y.o.   MRN: [id number]  HPI [name] [name] was last seen January '11. IN the interval he had colonoscopy FEb '11 - adenomatous polyps; Nuc stress test Nov '11. He otherwise he has not had any major medical problems, no surgery and no injury. He has been feeling ok but he has low energy and continued back pain.   The patient is here for annual Medicare wellness examination and management of other chronic and acute problems.   The risk factors are reflected in the social history.  The roster of all physicians providing medical care to patient - is listed in the Snapshot section of the chart.  Activities of daily living:  The patient is 100% inedpendent in all ADLs: dressing, toileting, feeding as well as independent mobility  Home safety : The patient has smoke detectors in the home. They wear seatbelts. No firearms at home  There is no risks for hepatitis, STDs or HIV. There is a history of blood transfusion. They have no travel history to infectious disease endemic areas of the world.  The patient has not seen their dentist in the last six month. They have seen their eye doctor in the last year. They deny any hearing difficulty and have not had audiologic testing in the last year.  They do not  have excessive sun exposure. Discussed the need for sun protection: hats, long sleeves and use of sunscreen if there is significant sun exposure.   Diet: the importance of a healthy diet is discussed. They do have a healthy diet.  The patient has no regular exercise program.  The benefits of regular aerobic exercise were discussed.  Depression screen: there are no signs or vegative symptoms of depression- irritability, change in appetite, anhedonia, sadness/tearfullness.  Cognitive assessment: the patient manages all their financial and personal affairs and is actively engaged. They could relate day,date,year and events; recalled  3/3 objects at 3 minutes; performed clock-face test normally.  The following portions of the patient's history were reviewed and updated as appropriate: allergies, current medications, past family history, past medical history,  past surgical history, past social history  and problem list.  Vision, hearing, body mass index were assessed and reviewed.   During the course of the visit the patient was educated and counseled about appropriate screening and preventive services including : fall prevention , diabetes screening, nutrition counseling, colorectal cancer screening, and recommended immunizations.  Past Medical History  Diagnosis Date  . Thrombocytopenia     Dr. Arline Asp  . Hypertension   . Dyslipidemia   . Ejection fraction     EF 55%, nuclear 2009  . Hx of CABG     2004  . Back pain     10/2010  . RBBB (right bundle branch block with left anterior fascicular block)   . HTN (hypertension)   . Hx of coronary artery bypass graft 2004  . CAD (coronary artery disease)    Past Surgical History  Procedure Date  . Coronary artery bypass graft   . Thr right Nov '04   Family History  Problem Relation Age of Onset  . Kidney disease Mother   . COPD Mother   . Stroke Father     cerebral hemorrhage  . Cancer Paternal Grandfather   . Heart disease Brother   . Arthritis Brother    History   Social History  . Marital Status: Married  Spouse Name: N/A    Number of Children: 2  . Years of Education: 12   Occupational History  . trucking     Social History Main Topics  . Smoking status: Current Some Day Smoker -- 0.2 packs/day for 45 years  . Smokeless tobacco: Never Used  . Alcohol Use: No  . Drug Use: No  . Sexually Active: Not Currently   Other Topics Concern  . Not on file   Social History Narrative   HSG, Married '62, 1 son-'70, 1 dtr - '68; 2 grandchildren. Work - Midwife.ACP - DNR, DNI, no artificial hydration or feeding, no heroic measures          Review of Systems Constitutional:  Negative for fever, chills, activity change and unexpected weight change.  HEENT:  Negative for hearing loss, ear pain, congestion, neck stiffness and postnasal drip. Negative for sore throat or swallowing problems. Negative for dental complaints.   Eyes: Negative for vision loss or change in visual acuity.  Respiratory: Negative for chest tightness and wheezing. Negative for DOE.   Cardiovascular: Negative for chest pain or palpitations. No decreased exercise tolerance Gastrointestinal: No change in bowel habit. No bloating or gas. No reflux or indigestion Genitourinary: Negative for urgency, frequency, flank pain and difficulty urinating.  Musculoskeletal: Negative for myalgias, back pain, arthralgias and gait problem.  Neurological: Negative for dizziness, tremors, weakness and headaches.  Hematological: Negative for adenopathy.  Psychiatric/Behavioral: Negative for behavioral problems and dysphoric mood.       Objective:   Physical Exam Vital signs reviewed Gen'l: Well nourished well developed whitemale in no acute distress  HEENT: Head: Normocephalic and atraumatic. Right Ear: External ear normal. EAC/TM nl. Left Ear: External ear normal.  EAC/TM nl. Nose: Nose normal. Mouth/Throat: Oropharynx is clear and moist.  No buccal or palatal lesions. Posterior pharynx clear. Eyes: Conjunctivae and sclera clear. EOM intact. Pupils are equal, round, and reactive to light. Right eye exhibits no discharge. Left eye exhibits no discharge. Neck: Normal range of motion. Neck supple. No JVD present. No tracheal deviation present. No thyromegaly present.  Cardiovascular: Normal rate, regular rhythm, no gallop, no friction rub, no murmur heard.      Quiet precordium. 2+ radial and DP pulses . No carotid bruits Pulmonary/Chest: Effort normal. No respiratory distress or increased WOB, no wheezes, no rales. No chest wall deformity or CVAT. Abdominal: Soft.  Bowel sounds are normal in all quadrants. He exhibits no distension, no tenderness, no rebound or guarding, No heptosplenomegaly  Genitourinary:  deferred Musculoskeletal: Normal range of motion. He exhibits no edema and no tenderness.       Small and large joints without redness, synovial thickening or deformity. Full range of motion preserved about all small, median and large joints except for decreased ROM leg.  Lymphadenopathy:    He has no cervical or supraclavicular adenopathy.  Neurological: He is alert and oriented to person, place, and time. CN II-XII intact. DTRs 2+ and symmetrical biceps, radial and patellar tendons. Cerebellar function normal with no tremor, rigidity, normal gait and station.  Skin: Skin is warm and dry. No rash noted. No erythema.  Psychiatric: He has a normal mood and affect. His behavior is normal. Thought content normal.   Lab Results  Component Value Date   WBC 9.1 06/19/2011   HGB 14.8 06/19/2011   HCT 42.8 06/19/2011   PLT 80* 06/19/2011   GLUCOSE 91 06/19/2011   GLUCOSE 91 06/19/2011   CHOL 128 08/21/2011   TRIG  104.0 08/21/2011   HDL 42.80 08/21/2011   LDLCALC 64 08/21/2011   ALT 22 08/21/2011   AST 23 08/21/2011   NA 140 06/19/2011   NA 140 06/19/2011   K 4.5 06/19/2011   K 4.5 06/19/2011   CL 105 06/19/2011   CL 105 06/19/2011   CREATININE 0.82 06/19/2011   CREATININE 0.82 06/19/2011   BUN 15 06/19/2011   BUN 15 06/19/2011   CO2 25 06/19/2011   CO2 25 06/19/2011           Assessment & Plan:

## 2011-11-12 DIAGNOSIS — Z0000 Encounter for general adult medical examination without abnormal findings: Secondary | ICD-10-CM | POA: Insufficient documentation

## 2011-11-12 NOTE — Assessment & Plan Note (Signed)
Stable with only occasional breakthrough discomfort for which he takes Zantac with good results

## 2011-11-12 NOTE — Assessment & Plan Note (Signed)
Encouraged to stop smoking

## 2011-11-12 NOTE — Assessment & Plan Note (Signed)
Interval medical history is unremarkable and stable. He continues to have a leg problem and uses a cane. Limited physical exam is normal. Reviewed recent labs - no abnormalities. He is current with colorectal cancer screening. Immunizations: current with Tdap and influenza, due for shingles and pneumonia vaccine.  In summary - a nice man who appears to be medically stable. He follows closely with cardiology. He is counseled to return in 1 year, sooner as needed.

## 2011-11-12 NOTE — Assessment & Plan Note (Signed)
Current with Dr. Myrtis Ser. He has been stable.  Plan - continue risk reduction - NEEDS TO STOP SMOKING

## 2011-12-05 ENCOUNTER — Telehealth: Payer: Self-pay | Admitting: Oncology

## 2011-12-05 NOTE — Telephone Encounter (Signed)
SPOKE WITH WIFE AND SHE IS AWARE OF APPT FOR 01/16/12   AOM  ORDER PER MOSAIQ

## 2011-12-09 HISTORY — PX: EYE SURGERY: SHX253

## 2011-12-10 ENCOUNTER — Telehealth: Payer: Self-pay | Admitting: Cardiology

## 2011-12-10 NOTE — Telephone Encounter (Signed)
New msg Pt wants refill  Simvastatin Metoprolol lisinoril He has changed to [facility] [phone] Id number [id number]

## 2011-12-11 MED ORDER — SIMVASTATIN 40 MG PO TABS: 40.0000 mg | ORAL_TABLET | Freq: Every day | ORAL | Status: DC

## 2011-12-11 MED ORDER — LISINOPRIL 10 MG PO TABS: 10.0000 mg | ORAL_TABLET | Freq: Every day | ORAL | Status: DC

## 2011-12-11 MED ORDER — METOPROLOL TARTRATE 50 MG PO TABS: 50.0000 mg | ORAL_TABLET | Freq: Two times a day (BID) | ORAL | Status: DC

## 2011-12-11 NOTE — Telephone Encounter (Signed)
RX sent into pharmacy. Pt notified. 

## 2012-01-16 ENCOUNTER — Other Ambulatory Visit: Payer: Medicare Other | Admitting: Lab

## 2012-01-16 ENCOUNTER — Encounter: Payer: Self-pay | Admitting: Oncology

## 2012-01-16 ENCOUNTER — Ambulatory Visit (HOSPITAL_BASED_OUTPATIENT_CLINIC_OR_DEPARTMENT_OTHER): Payer: Medicare Other | Admitting: Oncology

## 2012-01-16 DIAGNOSIS — D472 Monoclonal gammopathy: Secondary | ICD-10-CM

## 2012-01-16 DIAGNOSIS — D696 Thrombocytopenia, unspecified: Secondary | ICD-10-CM

## 2012-01-16 LAB — CBC WITH DIFFERENTIAL/PLATELET
Basophils Absolute: 0 10*3/uL (ref 0.0–0.1)
EOS%: 10.6 % — ABNORMAL HIGH (ref 0.0–7.0)
HGB: 14.4 g/dL (ref 13.0–17.1)
LYMPH%: 25.3 % (ref 14.0–49.0)
MCH: 31 pg (ref 27.2–33.4)
MCV: 91.6 fL (ref 79.3–98.0)
MONO%: 6.2 % (ref 0.0–14.0)
Platelets: 74 10*3/uL — ABNORMAL LOW (ref 140–400)
RBC: 4.65 10*6/uL (ref 4.20–5.82)
RDW: 13.6 % (ref 11.0–14.6)

## 2012-01-16 NOTE — Progress Notes (Signed)
This office note has been dictated.  #[id number]

## 2012-01-16 NOTE — Assessment & Plan Note (Addendum)
Onset 1999.

## 2012-01-16 NOTE — Progress Notes (Signed)
CC:   [name] Abed, MD, [facility] [name] Gess. Norins, MD [name] [name], M.D.  PROBLEM LIST: 1. 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  The patient has never had     a bone marrow exam.  Platelets tend to fluctuate.  On one occasion,     06/17/2010, the platelet count was 20,000.  However, 1 week later     on 06/24/2010, it was 110,000.  For the most part, platelets     fluctuate between 50 and 100,000.  The patient has not had any     undue bleeding. 2. 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  Urine immunofixation electrophoresis is     positive for monoclonal protein. 3. Coronary artery disease, status post myocardial infarction, dating     back to 1993.  The patient underwent CABG on 04/10/2003. 4. Hypertension. 5. Dyslipidemia. 6. Benign prostate hypertrophy. 7. 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 8. History of kidney stones. 9. GERD.  MEDICATIONS: 1. Aspirin 81 mg daily. 2. Coricidin D take as needed. 3. Valium 10 mg as needed. 4. Colace 100 mg twice a day. 5. Norco to take as needed, although the patient states he has not     required this in several months. 6. Lisinopril 10 mg daily. 7. Lopressor 50 mg twice a day. 8. Centrum Silver daily. 9. Prilosec 20 mg daily. 10.Zocor 40 mg at bedtime. 11.Flomax 0.4 mg three times a week. 12.Ultram 50 mg as needed, anywhere from 0-3 times a day.  HISTORY:  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  [name] was last seen by [place] on 06/19/2011.  He has been followed here since late April 2004.  There has been no change in his condition.  He bruises easily but denies any bleeding difficulties.  His overall health has been excellent and there have been no significant  changes in his medical condition.  PHYSICAL EXAMINATION:  General Appearance:  He looks well.  He is in good spirits, as usual.  Vital Signs:  Weight is 173 pounds and 8 ounces.  This is stable.  Height is 5 feet and 10 inches.  Body surface area 1.97 m2square.  Blood pressure 117/70.  Other vital signs are normal.  HEENT:  There is no scleral icterus.  Mouth and pharynx are benign.  He may have some rosacea.  Lymph Nodes:  No peripheral adenopathy palpable in the neck, supraclavicular, axillary, or inguinal areas.  Heart:  Normal.  Lungs:  Normal.  He has some rhonchi at the right base that cleared with cough.  The patient has been a heavy smoker up to 2 packs of cigarettes a day.  Currently, he is smoking about a pack of cigarettes a week.  Once again, he was encouraged to stopped smoking.  Abdomen:  Benign with no organomegaly or masses palpable. Extremities:  No peripheral edema or clubbing.  He walks with a cane and a pronounced limp.  He does have some chronic hemosiderin changes in his arms from repeated bruising.  He has trauma-related purpura on his left hand.  No petechiae or significant purpura.  LABORATORY DATA TODAY:  White count 8.4, ANC 4.8, hemoglobin 14.4, hematocrit 42.6, platelets 74,000.  On 06/19/2011,  platelet count was 80,000 and on 03/20/2011, it was 71,000.  Chemistries from 06/19/2011 were normal.  Quantitative immunoglobulins from 06/19/2011:  IgG level was 1,250, IgA 112, and IgM 62; all of which are in the normal range.  IMAGING STUDIES: 1. Limited ultrasound of the spleen on 03/10/2005:  The spleen     measures 10.7 x 4.2 x 3.9 cm and appears to be within normal limits     for size with a calculated volume of 93 cubic centimeters. 2. Chest x-ray, 2 view, from 01/01/2010:  Previous CABG with no active     disease. 3. CT scan of abdomen and pelvis without IV contrast shows mild left     hydronephrosis and perinephric stranding.  A questionable 1-2 mm      stone at the left ureterovesical junction.  Visualization was     difficult due to the beam hardening artifact from the right hip     replacement. 4. Lumbar myelogram on 08/02/2010 shows scoliosis convex to the right     with apex at L2-3.  There was disk degeneration throughout the     lumbar spine, more pronounced on the right at L4-5 and L5-S1 and on     the left at L2-3.  There is endplate sclerosis and there are     endplate Schmorl's nodes that could be associated with mechanical     back pain.  There was a benign-appearing hemangioma within the     right side of the L3 vertebral body.  IMPRESSION AND PLAN:  [name] continues to do well.  His platelet count has been fairly consistent within the past year or so, running around 70-80,000.  This is not a clinically significant problem for him. 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  He appears to have a benign monoclonal gammopathy.  We will plan to see [name] again in 1 year at which time we will check CBC, chemistries, and quantitative immunoglobulins.    ______________________________ Samul Dada, M.D. DSM/MEDQ  D:  01/16/2012  T:  01/16/2012  Job:  [id number]

## 2012-01-17 LAB — IGG, IGA, IGM
IgA: 99 mg/dL (ref 68–379)
IgG (Immunoglobin G), Serum: 1200 mg/dL (ref 650–1600)
IgM, Serum: 55 mg/dL (ref 41–251)

## 2012-01-17 LAB — COMPREHENSIVE METABOLIC PANEL
Alkaline Phosphatase: 72 U/L (ref 39–117)
Creatinine, Ser: 1.04 mg/dL (ref 0.50–1.35)
Glucose, Bld: 118 mg/dL — ABNORMAL HIGH (ref 70–99)
Sodium: 139 mEq/L (ref 135–145)
Total Bilirubin: 0.4 mg/dL (ref 0.3–1.2)
Total Protein: 6.7 g/dL (ref 6.0–8.3)

## 2012-03-02 ENCOUNTER — Ambulatory Visit (INDEPENDENT_AMBULATORY_CARE_PROVIDER_SITE_OTHER): Payer: Medicare Other | Admitting: Urology

## 2012-03-02 DIAGNOSIS — N529 Male erectile dysfunction, unspecified: Secondary | ICD-10-CM

## 2012-03-02 DIAGNOSIS — N400 Enlarged prostate without lower urinary tract symptoms: Secondary | ICD-10-CM

## 2012-03-02 DIAGNOSIS — N281 Cyst of kidney, acquired: Secondary | ICD-10-CM

## 2012-09-10 ENCOUNTER — Other Ambulatory Visit: Payer: Self-pay

## 2012-09-10 MED ORDER — LISINOPRIL 10 MG PO TABS: 10.0000 mg | ORAL_TABLET | Freq: Every day | ORAL | Status: DC

## 2012-09-13 ENCOUNTER — Telehealth: Payer: Self-pay | Admitting: Medical Oncology

## 2012-09-13 ENCOUNTER — Telehealth: Payer: Self-pay | Admitting: Oncology

## 2012-09-13 ENCOUNTER — Other Ambulatory Visit: Payer: Self-pay | Admitting: Medical Oncology

## 2012-09-13 DIAGNOSIS — D696 Thrombocytopenia, unspecified: Secondary | ICD-10-CM

## 2012-09-13 NOTE — Telephone Encounter (Signed)
Pt called and states that he is having cataract surgery Thursday. His surgeon would like for him to get a CBC prior to check his platelets. Per Dr. Arline Asp this is fine. Pt notified the schedulers will call with an appointment for the am.

## 2012-09-13 NOTE — Telephone Encounter (Signed)
s/w pt and he is aware of the 10/8 lab   aom

## 2012-09-14 ENCOUNTER — Other Ambulatory Visit (HOSPITAL_BASED_OUTPATIENT_CLINIC_OR_DEPARTMENT_OTHER): Payer: Medicare Other | Admitting: Lab

## 2012-09-14 ENCOUNTER — Telehealth: Payer: Self-pay | Admitting: Medical Oncology

## 2012-09-14 DIAGNOSIS — D696 Thrombocytopenia, unspecified: Secondary | ICD-10-CM

## 2012-09-14 LAB — CBC WITH DIFFERENTIAL/PLATELET
BASO%: 0.2 % (ref 0.0–2.0)
LYMPH%: 19.2 % (ref 14.0–49.0)
MCHC: 33.3 g/dL (ref 32.0–36.0)
MONO#: 0.5 10*3/uL (ref 0.1–0.9)
RBC: 4.43 10*6/uL (ref 4.20–5.82)
RDW: 13.4 % (ref 11.0–14.6)
WBC: 8.1 10*3/uL (ref 4.0–10.3)
lymph#: 1.6 10*3/uL (ref 0.9–3.3)

## 2012-09-14 NOTE — Telephone Encounter (Signed)
I called pt and left a message with his platelet count of 81. I asked him to call me if he would like for me to fax results to his ophthalmologist.

## 2012-10-28 ENCOUNTER — Encounter: Payer: Self-pay | Admitting: Cardiology

## 2012-10-28 ENCOUNTER — Ambulatory Visit (INDEPENDENT_AMBULATORY_CARE_PROVIDER_SITE_OTHER): Payer: Medicare Other | Admitting: Cardiology

## 2012-10-28 VITALS — BP 142/92 | HR 66 | Ht 70.0 in | Wt 176.0 lb

## 2012-10-28 DIAGNOSIS — F17200 Nicotine dependence, unspecified, uncomplicated: Secondary | ICD-10-CM

## 2012-10-28 DIAGNOSIS — R0989 Other specified symptoms and signs involving the circulatory and respiratory systems: Secondary | ICD-10-CM

## 2012-10-28 DIAGNOSIS — E785 Hyperlipidemia, unspecified: Secondary | ICD-10-CM

## 2012-10-28 DIAGNOSIS — I452 Bifascicular block: Secondary | ICD-10-CM

## 2012-10-28 DIAGNOSIS — M549 Dorsalgia, unspecified: Secondary | ICD-10-CM

## 2012-10-28 DIAGNOSIS — I10 Essential (primary) hypertension: Secondary | ICD-10-CM

## 2012-10-28 DIAGNOSIS — D696 Thrombocytopenia, unspecified: Secondary | ICD-10-CM

## 2012-10-28 DIAGNOSIS — I2510 Atherosclerotic heart disease of native coronary artery without angina pectoris: Secondary | ICD-10-CM

## 2012-10-28 NOTE — Assessment & Plan Note (Signed)
The patient has chronic thrombocytopenia. He also has a gammopathy. I have reviewed the notes from Dr. Arline Asp. These problems are stable at this time.

## 2012-10-28 NOTE — Assessment & Plan Note (Signed)
The patient continues to smoke a small number of cigarettes daily. He and I discussed this and he continues to try to stop completely.

## 2012-10-28 NOTE — Assessment & Plan Note (Signed)
Patient has chronic right bundle branch block. There is no change. No need for further workup.

## 2012-10-28 NOTE — Assessment & Plan Note (Signed)
His lipids are being treated. No change in therapy.   

## 2012-10-28 NOTE — Assessment & Plan Note (Signed)
Coronary disease is stable. He does not need any type of followup exercise testing at this time.

## 2012-10-28 NOTE — Assessment & Plan Note (Signed)
Blood pressure is slightly elevated here in the office today. In general he checks it regularly and and is not elevated. No change in therapy.

## 2012-10-28 NOTE — Assessment & Plan Note (Signed)
The patient has a carotid bruit today. I have carefully reviewed the records. We do not have any documentation of carotid Dopplers done over many years. He will have a carotid Doppler. I will be in touch with him with the information.

## 2012-10-28 NOTE — Progress Notes (Signed)
HPI  Patient is seen to followup coronary disease. I saw him last November, 2012. From the cardiac viewpoint he has been stable. He has known coronary disease. He underwent CABG in 2004. He had a nuclear study in November, 2011. The ejection fraction was 55%. There was no significant ischemia.  He continues to smoke one to 2 cigarettes per day. He continues to work on trying to stop completely.  Unfortunately he is having significant back pain.  Allergies  Allergen Reactions  . Penicillins     Current Outpatient Prescriptions  Medication Sig Dispense Refill  . aspirin 81 MG tablet Take 81 mg by mouth daily.        . Chlorphen-Phenyleph-APAP (CORICIDIN D COLD/FLU/SINUS) 2-5-325 MG TABS Take by mouth as needed.        . diazepam (VALIUM) 10 MG tablet Take 10 mg by mouth every 6 (six) hours as needed.        . docusate sodium (COLACE) 100 MG capsule Take 100 mg by mouth 2 (two) times daily.        . DUREZOL 0.05 % EMUL as directed.      Marland Kitchen HYDROcodone-acetaminophen (NORCO) 5-325 MG per tablet Take 1 tablet by mouth as needed.        Marland Kitchen lisinopril (PRINIVIL,ZESTRIL) 10 MG tablet Take 1 tablet (10 mg total) by mouth daily.  90 tablet  2  . metoprolol (LOPRESSOR) 50 MG tablet Take 1 tablet (50 mg total) by mouth 2 (two) times daily.  180 tablet  3  . Multiple Vitamins-Minerals (CENTRUM SILVER ULTRA MENS PO) Take by mouth daily.        Marland Kitchen omeprazole (PRILOSEC) 20 MG capsule Take 20 mg by mouth daily.        . simvastatin (ZOCOR) 40 MG tablet Take 1 tablet (40 mg total) by mouth at bedtime.  90 tablet  3  . Tamsulosin HCl (FLOMAX) 0.4 MG CAPS Take 1 capsule (0.4 mg total) by mouth 3 (three) times a week.  30 capsule  11  . traMADol (ULTRAM) 50 MG tablet Take 50 mg by mouth every 6 (six) hours as needed. Maximum dose= 8 tablets per day         History   Social History  . Marital Status: Married    Spouse Name: N/A    Number of Children: 2  . Years of Education: 12   Occupational History    . trucking     Social History Main Topics  . Smoking status: Light Tobacco Smoker -- 45 years  . Smokeless tobacco: Never Used     Comment: 1-2 cigarettes daily  . Alcohol Use: No  . Drug Use: No  . Sexually Active: Not Currently   Other Topics Concern  . Not on file   Social History Narrative   HSG, Married '62, 1 son-'70, 1 dtr - '68; 2 grandchildren. Work - Midwife.ACP - DNR, DNI, no artificial hydration or feeding, no heroic measures    Family History  Problem Relation Age of Onset  . Kidney disease Mother   . COPD Mother   . Stroke Father     cerebral hemorrhage  . Cancer Paternal Grandfather   . Heart disease Brother   . Arthritis Brother     Past Medical History  Diagnosis Date  . Thrombocytopenia     Dr. Arline Asp  . Hypertension   . Dyslipidemia   . Ejection fraction     EF 55%, nuclear 2009  . Hx  of CABG     2004  . Back pain     10/2010  . RBBB (right bundle branch block with left anterior fascicular block)   . HTN (hypertension)   . Hx of coronary artery bypass graft 2004  . CAD (coronary artery disease)     Past Surgical History  Procedure Date  . Coronary artery bypass graft   . Thr right Nov '04    Patient Active Problem List  Diagnosis  . TOBACCO ABUSE  . DYSPEPSIA  . RBBB (right bundle branch block with left anterior fascicular block)  . Thrombocytopenia  . Hypertension  . Dyslipidemia  . CAD (coronary artery disease)  . Ejection fraction  . Hx of CABG  . Back pain  . Routine health maintenance  . MGUS (monoclonal gammopathy of unknown significance)    ROS   Patient denies fever, chills, headache, sweats, rash, change in vision, change in hearing, chest pain, cough, nausea vomiting, urinary symptoms. All other systems are reviewed and are negative.  PHYSICAL EXAM  Patient is oriented to person time and place. Affect is normal. He does walk with a cane. There is no jugulovenous distention. There is a soft right  carotid bruit. Lungs are clear. Respiratory effort is nonlabored. Cardiac exam reveals S1 and S2. There no clicks or significant murmurs. The abdomen is soft. There is no peripheral edema. He is walking with a cane. There no significant rashes.  Filed Vitals:   10/28/12 0944  BP: 142/92  Pulse: 66  Height: 5\' 10"  (1.778 m)  Weight: 176 lb (79.833 kg)   EKG is done today and reviewed by me. There is sinus rhythm. 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 There is right bundle branch block which is also old. There is no significant change when compared to the prior tracing.  ASSESSMENT & PLAN

## 2012-10-28 NOTE — Assessment & Plan Note (Signed)
The patient is having low back pain. It does not appear to be claudication. No further workup from the cardiac viewpoint.

## 2012-10-28 NOTE — Patient Instructions (Signed)
Your physician wants you to follow-up in: 1 year.   You will receive a reminder letter in the mail two months in advance. If you don't receive a letter, please call our office to schedule the follow-up appointment.  Your physician has requested that you have a carotid duplex. This test is an ultrasound of the carotid arteries in your neck. It looks at blood flow through these arteries that supply the brain with blood. Allow one hour for this exam. There are no restrictions or special instructions.    

## 2012-10-29 ENCOUNTER — Encounter (INDEPENDENT_AMBULATORY_CARE_PROVIDER_SITE_OTHER): Payer: Medicare Other

## 2012-10-29 DIAGNOSIS — I6529 Occlusion and stenosis of unspecified carotid artery: Secondary | ICD-10-CM

## 2012-10-29 DIAGNOSIS — R0989 Other specified symptoms and signs involving the circulatory and respiratory systems: Secondary | ICD-10-CM

## 2012-11-01 ENCOUNTER — Encounter: Payer: Self-pay | Admitting: Cardiology

## 2012-11-01 DIAGNOSIS — I739 Peripheral vascular disease, unspecified: Secondary | ICD-10-CM

## 2012-11-01 DIAGNOSIS — I779 Disorder of arteries and arterioles, unspecified: Secondary | ICD-10-CM | POA: Insufficient documentation

## 2012-12-06 ENCOUNTER — Other Ambulatory Visit (INDEPENDENT_AMBULATORY_CARE_PROVIDER_SITE_OTHER): Payer: Medicare Other

## 2012-12-06 ENCOUNTER — Encounter: Payer: Self-pay | Admitting: Internal Medicine

## 2012-12-06 ENCOUNTER — Ambulatory Visit (INDEPENDENT_AMBULATORY_CARE_PROVIDER_SITE_OTHER): Payer: Medicare Other | Admitting: Internal Medicine

## 2012-12-06 VITALS — BP 124/84 | HR 84 | Temp 97.2°F | Resp 10 | Ht 70.0 in | Wt 174.0 lb

## 2012-12-06 DIAGNOSIS — I10 Essential (primary) hypertension: Secondary | ICD-10-CM

## 2012-12-06 DIAGNOSIS — E785 Hyperlipidemia, unspecified: Secondary | ICD-10-CM

## 2012-12-06 DIAGNOSIS — I2510 Atherosclerotic heart disease of native coronary artery without angina pectoris: Secondary | ICD-10-CM

## 2012-12-06 DIAGNOSIS — Z23 Encounter for immunization: Secondary | ICD-10-CM

## 2012-12-06 DIAGNOSIS — Z0000 Encounter for general adult medical examination without abnormal findings: Secondary | ICD-10-CM

## 2012-12-06 LAB — LIPID PANEL
Cholesterol: 138 mg/dL (ref 0–200)
HDL: 38 mg/dL — ABNORMAL LOW (ref >39.00)
VLDL: 27.6 mg/dL (ref 0.0–40.0)

## 2012-12-06 LAB — HEPATIC FUNCTION PANEL
ALT: 19 U/L (ref 0–53)
AST: 21 U/L (ref 0–37)
Total Bilirubin: 0.6 mg/dL (ref 0.3–1.2)
Total Protein: 8 g/dL (ref 6.0–8.3)

## 2012-12-06 NOTE — Patient Instructions (Addendum)
Thanks for coming in to see me.  Your exam is normal. Labs are ordered for today: cholesterol levels and liver functions. Full report to follow including labs.  Continue all your present medications.  If needed - epidural steroid injections can be very helpful in regard to back pain and sciatica  Have a Happy New Year.

## 2012-12-06 NOTE — Progress Notes (Signed)
Subjective:    Patient ID: [name] [name], male    DOB: 1941/05/13, 71 y.o.   MRN: [id number]  HPI The patient is here for annual Medicare wellness examination and management of other chronic and acute problems.  [name] [name] is having on-going low back pain that is followed by Dr. Ethelene Hal. He has not had ESI but is coming to that. He also has intermittent flares of sciatica that will lead to his right knee going out.He has not had any falls. He does get some relief with Tramadol and has not required any oxycodone for over a year.    The risk factors are reflected in the social history.  The roster of all physicians providing medical care to patient - is listed in the Snapshot section of the chart.  Activities of daily living:  The patient is 100% inedpendent in all ADLs: dressing, toileting, feeding as well as independent mobility  Home safety : The patient has smoke detectors in the home. Falls - none. Home is fall safe including grab bar in the shower. They wear seatbelts. No firearms at home   There is no risks for hepatitis, STDs or HIV. There is a history of blood transfusion with hip replacement. They have no travel history to infectious disease endemic areas of the world.  The patient has not seen their dentist. They have seen their eye doctor in the last year - had cataract surgery AD. They admit to minor hearing difficulty and have not had audiologic testing in the last year.    They do not  have excessive sun exposure. Discussed the need for sun protection: hats, long sleeves and use of sunscreen if there is significant sun exposure.   Diet: the importance of a healthy diet is discussed. They do have a healthy diet.  The patient has no regular exercise program due to back problems. Doe s hope to use a recumbant bike.  The benefits of regular aerobic exercise were discussed.  Depression screen: there are no signs or vegative symptoms of depression- irritability, change in appetite,  anhedonia, sadness/tearfullness.  Cognitive assessment: the patient manages all their financial and personal affairs and is actively engaged. Has a little trouble with name recall.   The following portions of the patient's history were reviewed and updated as appropriate: allergies, current medications, past family history, past medical history,  past surgical history, past social history  and problem list.  Past Medical History  Diagnosis Date  . Thrombocytopenia     Dr. Arline Asp  . Hypertension   . Dyslipidemia   . Ejection fraction     EF 55%, nuclear 2009  . Hx of CABG     2004  . Back pain     10/2010  . RBBB (right bundle branch block with left anterior fascicular block)   . HTN (hypertension)   . Hx of coronary artery bypass graft 2004  . CAD (coronary artery disease)   . Back pain     November, 2013  . Carotid bruit     November, 2013  . Carotid artery disease     Doppler, November, 2013, 0-39% bilateral   Past Surgical History  Procedure Date  . Coronary artery bypass graft   . Thr right Nov '04  . Eye surgery 2013    bilateral cataract extraction w/ IOL (Dr. Honor Loh)   Family History  Problem Relation Age of Onset  . Kidney disease Mother   . COPD Mother   . Stroke Father  cerebral hemorrhage  . Cancer Paternal Grandfather   . Heart disease Brother   . Arthritis Brother    History   Social History  . Marital Status: Married    Spouse Name: N/A    Number of Children: 2  . Years of Education: 12   Occupational History  . trucking     Social History Main Topics  . Smoking status: Light Tobacco Smoker -- 45 years  . Smokeless tobacco: Never Used     Comment: 1-2 cigarettes daily  . Alcohol Use: No  . Drug Use: No  . Sexually Active: Not Currently   Other Topics Concern  . Not on file   Social History Narrative   HSG, Married '62, 1 son-'70, 1 dtr - '68; 2 grandchildren. 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 aspirin 81 MG tablet Take 81 mg by mouth daily.        . Chlorphen-Phenyleph-APAP (CORICIDIN D COLD/FLU/SINUS) 2-5-325 MG TABS Take by mouth as needed.        . diazepam (VALIUM) 10 MG tablet Take 10 mg by mouth every 6 (six) hours as needed.        . docusate sodium (COLACE) 100 MG capsule Take 100 mg by mouth 2 (two) times daily.        Marland Kitchen HYDROcodone-acetaminophen (NORCO) 5-325 MG per tablet Take 1 tablet by mouth as needed.        Marland Kitchen lisinopril (PRINIVIL,ZESTRIL) 10 MG tablet Take 1 tablet (10 mg total) by mouth daily.  90 tablet  2  . metoprolol (LOPRESSOR) 50 MG tablet Take 1 tablet (50 mg total) by mouth 2 (two) times daily.  180 tablet  3  . Multiple Vitamins-Minerals (CENTRUM SILVER ULTRA MENS PO) Take by mouth daily.        Marland Kitchen omeprazole (PRILOSEC) 20 MG capsule Take 20 mg by mouth daily.        Maxwell Caul Bicarbonate (ZEGERID) 20-1100 MG CAPS Take 1 capsule by mouth daily before breakfast.      . simvastatin (ZOCOR) 40 MG tablet Take 1 tablet (40 mg total) by mouth at bedtime.  90 tablet  3  . Tamsulosin HCl (FLOMAX) 0.4 MG CAPS Take 1 capsule (0.4 mg total) by mouth 3 (three) times a week.  30 capsule  11  . traMADol (ULTRAM) 50 MG tablet Take 50 mg by mouth every 6 (six) hours as needed. Maximum dose= 8 tablets per day       . DUREZOL 0.05 % EMUL as directed.         Vision, hearing, body mass index were assessed and reviewed.   During the course of the visit the patient was educated and counseled about appropriate screening and preventive services including : fall prevention , diabetes screening, nutrition counseling, colorectal cancer screening, and recommended immunizations.    Review of Systems Constitutional:  Negative for fever, chills, activity change and unexpected weight change.  HEENT:  Negative for hearing loss, ear pain, congestion,  neck stiffness and postnasal drip. Negative for sore throat or swallowing problems. Negative for dental complaints.   Eyes: Negative for vision loss or change in visual acuity. Actually seeing better than ever after cataract surgery Respiratory: Negative for chest tightness and wheezing. Negative for DOE.   Cardiovascular: Negative for chest pain or palpitations. No decreased exercise tolerance Gastrointestinal:  No change in bowel habit - difficulty with bowel. No bloating or gas. No reflux or indigestion Genitourinary: Negative for urgency, frequency, flank pain and difficulty urinating. Nocturia 2-4.   Musculoskeletal: Negative for myalgias, arthralgias and gait problem. Positive for back pain and sciatica right LE.  Neurological: Negative for dizziness, tremors, weakness and headaches.  Hematological: Negative for adenopathy.  Psychiatric/Behavioral: Negative for behavioral problems and dysphoric mood.       Objective:   Physical Exam Filed Vitals:   12/06/12 1318  BP: 124/84  Pulse: 84  Temp: 97.2 F (36.2 C)  Resp: 10   Wt Readings from Last 3 Encounters:  12/06/12 174 lb (78.926 kg)  10/28/12 176 lb (79.833 kg)  01/16/12 173 lb 8 oz (78.699 kg)   Gen'l: Well nourished well developed white male in no acute distress  HEENT: Head: Normocephalic and atraumatic. Right Ear: External ear normal. EAC/TM nl. Left Ear: External ear normal.  EAC/TM nl. Nose: Nose normal. Mouth/Throat: Oropharynx is clear and moist. Dentition - native, in good repair. No buccal or palatal lesions. Posterior pharynx clear. Eyes: Conjunctivae and sclera clear. EOM intact. Pupils are equal, round, and reactive to light. Right eye exhibits no discharge. Left eye exhibits no discharge. Neck: Normal range of motion. Neck supple. No JVD present. No tracheal deviation present. No thyromegaly present.  Cardiovascular: Normal rate, regular rhythm, no gallop, no friction rub, no murmur heard.      Quiet precordium. 2+  radial and DP pulses . No carotid bruits Pulmonary/Chest: Effort normal. No respiratory distress or increased WOB, no wheezes, no rales. No chest wall deformity or CVAT. Abdomen: Soft. Bowel sounds are normal in all quadrants. He exhibits no distension, no tenderness, no rebound or guarding, No heptosplenomegaly  Genitourinary:  deferred Musculoskeletal: Normal range of motion. He exhibits no edema and no tenderness.       Small and large joints without redness, synovial thickening or deformity. Full range of motion preserved about all small, median and large joints.  Lymphadenopathy:    He has no cervical or supraclavicular adenopathy.  Neurological: He is alert and oriented to person, place, and time. CN II-XII intact. DTRs 2+ and symmetrical biceps, radial and patellar tendons. Cerebellar function normal with no tremor, rigidity, normal gait and station.  Skin: Skin is warm and dry. No rash noted. No erythema.  Psychiatric: He has a normal mood and affect. His behavior is normal. Thought content normal.   Lab Results  Component Value Date   WBC 8.1 09/14/2012   HGB 13.5 09/14/2012   HCT 40.6 09/14/2012   PLT 81* 09/14/2012   GLUCOSE 118* 01/16/2012   CHOL 138 12/06/2012   TRIG 138.0 12/06/2012   HDL 38.00* 12/06/2012   LDLCALC 72 12/06/2012   ALT 19 12/06/2012   AST 21 12/06/2012   NA 139 01/16/2012   K 4.1 01/16/2012   CL 105 01/16/2012   CREATININE 1.04 01/16/2012   BUN 13 01/16/2012   CO2 26 01/16/2012           Assessment & Plan:

## 2012-12-08 NOTE — Assessment & Plan Note (Signed)
Interval medical history is benign: no new medical problems or serious illness, no surgery or injury. Physical exam is OK. Labs, both current and recent , reviewed and in normal limits. He is current with colorectal cancer screening and has ages out of prostate screening (ACU April '13). Immunizations: due for pneumonia and shingles vaccines.  In summary - a nice man who appears to be medically stable at this time. He will return as needed or in 1 year.

## 2012-12-08 NOTE — Assessment & Plan Note (Signed)
Stable with no cardiac complaints, no limitation in activity. He is current in regard to follow up with cardiology

## 2012-12-08 NOTE — Assessment & Plan Note (Signed)
BP Readings from Last 3 Encounters:  12/06/12 124/84  10/28/12 142/92  01/16/12 117/70   Good control on present medications.

## 2012-12-08 NOTE — Assessment & Plan Note (Signed)
Lipid panel done - excellent control with LDL better than goal of 80 or less.  Plan - continue present medications.

## 2012-12-27 ENCOUNTER — Encounter: Payer: Self-pay | Admitting: Gastroenterology

## 2012-12-28 ENCOUNTER — Encounter: Payer: Self-pay | Admitting: Gastroenterology

## 2012-12-31 ENCOUNTER — Other Ambulatory Visit: Payer: Self-pay | Admitting: *Deleted

## 2012-12-31 MED ORDER — METOPROLOL TARTRATE 50 MG PO TABS: 50.0000 mg | ORAL_TABLET | Freq: Two times a day (BID) | ORAL | Status: DC

## 2013-02-01 ENCOUNTER — Ambulatory Visit (AMBULATORY_SURGERY_CENTER): Payer: Medicare Other

## 2013-02-01 ENCOUNTER — Encounter: Payer: Self-pay | Admitting: Gastroenterology

## 2013-02-01 VITALS — Ht 70.0 in | Wt 176.0 lb

## 2013-02-01 DIAGNOSIS — Z1211 Encounter for screening for malignant neoplasm of colon: Secondary | ICD-10-CM

## 2013-02-01 DIAGNOSIS — Z86010 Personal history of colonic polyps: Secondary | ICD-10-CM

## 2013-02-01 MED ORDER — NA SULFATE-K SULFATE-MG SULF 17.5-3.13-1.6 GM/177ML PO SOLN: 1.0000 | Freq: Once | ORAL | Status: DC

## 2013-02-15 ENCOUNTER — Ambulatory Visit (AMBULATORY_SURGERY_CENTER): Payer: Medicare Other | Admitting: Gastroenterology

## 2013-02-15 ENCOUNTER — Encounter: Payer: Self-pay | Admitting: Gastroenterology

## 2013-02-15 VITALS — BP 96/59 | HR 67 | Temp 96.7°F | Resp 18 | Ht 70.0 in | Wt 176.0 lb

## 2013-02-15 DIAGNOSIS — Z860100 Personal history of colon polyps, unspecified: Secondary | ICD-10-CM

## 2013-02-15 DIAGNOSIS — D126 Benign neoplasm of colon, unspecified: Secondary | ICD-10-CM

## 2013-02-15 DIAGNOSIS — K5730 Diverticulosis of large intestine without perforation or abscess without bleeding: Secondary | ICD-10-CM

## 2013-02-15 DIAGNOSIS — Z1211 Encounter for screening for malignant neoplasm of colon: Secondary | ICD-10-CM

## 2013-02-15 MED ORDER — SODIUM CHLORIDE 0.9 % IV SOLN: 500.0000 mL | INTRAVENOUS | Status: DC

## 2013-02-15 NOTE — Progress Notes (Signed)
Lidocaine-40mg IV prior to Propofol InductionPropofol given over incremental dosages 

## 2013-02-15 NOTE — Patient Instructions (Addendum)
YOU HAD AN ENDOSCOPIC PROCEDURE TODAY AT THE [facility]: Refer to the procedure report that was given to you for any specific questions about what was found during the examination.  If the procedure report does not answer your questions, please call your gastroenterologist to clarify.  If you requested that your care partner not be given the details of your procedure findings, then the procedure report has been included in a sealed envelope for you to review at your convenience later.  YOU SHOULD EXPECT: Some feelings of bloating in the abdomen. Passage of more gas than usual.  Walking can help get rid of the air that was put into your GI tract during the procedure and reduce the bloating. If you had a lower endoscopy (such as a colonoscopy or flexible sigmoidoscopy) you may notice spotting of blood in your stool or on the toilet paper. If you underwent a bowel prep for your procedure, then you may not have a normal bowel movement for a few days.  DIET: Your first meal following the procedure should be a light meal and then it is ok to progress to your normal diet.  A half-sandwich or bowl of soup is an example of a good first meal.  Heavy or fried foods are harder to digest and may make you feel nauseous or bloated.  Likewise meals heavy in dairy and vegetables can cause extra gas to form and this can also increase the bloating.  Drink plenty of fluids but you should avoid alcoholic beverages for 24 hours.  ACTIVITY: Your care partner should take you home directly after the procedure.  You should plan to take it easy, moving slowly for the rest of the day.  You can resume normal activity the day after the procedure however you should NOT DRIVE or use heavy machinery for 24 hours (because of the sedation medicines used during the test).    SYMPTOMS TO REPORT IMMEDIATELY: A gastroenterologist can be reached at any hour.  During normal business hours, 8:30 AM to 5:00 PM Monday through Friday,  call [phone].  After hours and on weekends, please call the GI answering service at [phone] who will take a message and have the physician on call contact you.   Following lower endoscopy (colonoscopy or flexible sigmoidoscopy):  Excessive amounts of blood in the stool  Significant tenderness or worsening of abdominal pains  Swelling of the abdomen that is new, acute  Fever of 100F or higher  Following upper endoscopy (EGD)  Vomiting of blood or coffee ground material  New chest pain or pain under the shoulder blades  Painful or persistently difficult swallowing  New shortness of breath  Fever of 100F or higher  Black, tarry-looking stools  FOLLOW UP: If any biopsies were taken you will be contacted by phone or by letter within the next 1-3 weeks.  Call your gastroenterologist if you have not heard about the biopsies in 3 weeks.  Our staff will call the home number listed on your records the next business day following your procedure to check on you and address any questions or concerns that you may have at that time regarding the information given to you following your procedure. This is a courtesy call and so if there is no answer at the home number and we have not heard from you through the emergency physician on call, we will assume that you have returned to your regular daily activities without incident.  SIGNATURES/CONFIDENTIALITY: You and/or your care   partner have signed paperwork which will be entered into your electronic medical record.  These signatures attest to the fact that that the information above on your After Visit Summary has been reviewed and is understood.  Full responsibility of the confidentiality of this discharge information lies with you and/or your care-partner.  

## 2013-02-15 NOTE — Progress Notes (Signed)
Patient did not experience any of the following events: a burn prior to discharge; a fall within the facility; wrong site/side/patient/procedure/implant event; or a hospital transfer or hospital admission upon discharge from the facility. (G8907) Patient did not have preoperative order for IV antibiotic SSI prophylaxis. (G8918)  

## 2013-02-15 NOTE — Op Note (Signed)
[facility] [address] [place], [address]   COLONOSCOPY PROCEDURE REPORT  PATIENT: [name] [name], [name] [name]  MR#: [id number] BIRTHDATE: October 11, 1941 , 71  yrs. 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   3 year followup because of multiple polyps MEDICATIONS: MAC sedation, administered by CRNA and propofol (Diprivan) 300mg  IV  DESCRIPTION OF PROCEDURE:   After the risks benefits and alternatives of the procedure were thoroughly explained, informed consent was obtained.  A digital rectal exam revealed no abnormalities of the rectum.   The LB [id number]  endoscope was introduced through the anus and advanced to the cecum, which was identified by both the appendix and ileocecal valve. No adverse events experienced.   The quality of the prep was Suprep excellent The instrument was then slowly withdrawn as the colon was fully examined.      COLON FINDINGS: There was a 2 mm polyp in the descending colon and a 1-2 mm polyp in the proximal transverse colon.  Each was removed with cold biopsy forceps and submitted to pathology.  In the proximal transverse colon there was a sessile 8 mm polyp that was removed with cold polypectomy snare and submitted to pathology.  A second 12 mm polyp was removed from the proximal transverse colon, piecemeal, and submitted to pathology.  The polyp remnant at the base of the polyp was removed with hot biopsy forceps.  In the distal transverse colon there is a 10 mm sessile polyp that was removed with cold polypectomy snare and submitted to pathology.  In the descending colon there were 2 10mm sessile polyps removed with polypectomy snares and  submitted to pathology.   There was a 2 mm polyp in the descending colon and a 1-2 mm polyp in the proximal transverse colon.  Each was removed with cold biopsy forceps and submitted to pathology.  In the proximal transverse colon there was a sessile 8 mm polyp that was removed with cold polypectomy snare and submitted to pathology.  A second 12 mm polyp was removed from the proximal transverse colon, piecemeal, and submitted to pathology.  The polyp remnant at the base of the polyp was removed with hot biopsy forceps.  In the distal transverse colon there is a 10 mm sessile polyp that was removed with cold polypectomy snare and submitted to pathology.  In the descending colon there were 2 10mm sessile polyps removed with polypectomy snares and submitted to pathology.   The colon mucosa was otherwise normal.  Retroflexed views revealed no abnormalities. The time to cecum=3 minutes 53 seconds.  Withdrawal time=18 minutes 40 seconds.  The scope was withdrawn and the procedure completed. COMPLICATIONS: There were no complications.  ENDOSCOPIC IMPRESSION: 1.   colonic polyposis  RECOMMENDATIONS: followup colonoscopy 3 years in view of polyposis with  eSigned:  Louis Meckel, MD 02/15/2013 10:35 AM   cc: Jacques Navy, MD   PATIENT NAME:  [name] [name], [name] [name] MR#: [id number]

## 2013-02-15 NOTE — Progress Notes (Signed)
Called to room to assist during endoscopic procedure.  Patient ID and intended procedure confirmed with present staff. Received instructions for my participation in the procedure from the performing physician.  

## 2013-02-16 ENCOUNTER — Telehealth: Payer: Self-pay | Admitting: *Deleted

## 2013-02-16 NOTE — Telephone Encounter (Signed)
  Follow up Call-  Call back number 02/15/2013  Post procedure Call Back phone  # [phone] or [phone]  Permission to leave phone message Yes     Patient questions:  Do you have a fever, pain , or abdominal swelling? no Pain Score  0 *  Have you tolerated food without any problems? yes  Have you been able to return to your normal activities? yes  Do you have any questions about your discharge instructions: Diet   no Medications  no Follow up visit  no  Do you have questions or concerns about your Care? no  Actions: * If pain score is 4 or above: No action needed, pain <4.

## 2013-02-24 ENCOUNTER — Encounter: Payer: Self-pay | Admitting: Gastroenterology

## 2013-03-16 ENCOUNTER — Other Ambulatory Visit: Payer: Self-pay

## 2013-03-16 MED ORDER — SIMVASTATIN 40 MG PO TABS: 40.0000 mg | ORAL_TABLET | Freq: Every day | ORAL | Status: DC

## 2013-03-17 ENCOUNTER — Telehealth: Payer: Self-pay | Admitting: Cardiology

## 2013-03-17 ENCOUNTER — Other Ambulatory Visit: Payer: Self-pay

## 2013-03-17 ENCOUNTER — Telehealth: Payer: Self-pay

## 2013-03-17 MED ORDER — SIMVASTATIN 40 MG PO TABS: 40.0000 mg | ORAL_TABLET | Freq: Every day | ORAL | Status: DC

## 2013-03-17 NOTE — Telephone Encounter (Signed)
Sent refill of simvastatin into [facility] for 90 day supply. Called and verified with patient that refill was needed at [facility].

## 2013-03-17 NOTE — Telephone Encounter (Signed)
Pt's wife calling re refill yesterday was called into [facility], and was to be called into [facility] 90 days supply with refills

## 2013-04-05 ENCOUNTER — Ambulatory Visit (INDEPENDENT_AMBULATORY_CARE_PROVIDER_SITE_OTHER): Payer: Medicare Other | Admitting: Urology

## 2013-04-05 DIAGNOSIS — N400 Enlarged prostate without lower urinary tract symptoms: Secondary | ICD-10-CM

## 2013-04-06 ENCOUNTER — Telehealth: Payer: Self-pay | Admitting: Oncology

## 2013-04-06 ENCOUNTER — Other Ambulatory Visit: Payer: Self-pay

## 2013-04-06 NOTE — Telephone Encounter (Signed)
lmonvm advising the pt of his June 2014 appts

## 2013-04-19 ENCOUNTER — Telehealth: Payer: Self-pay | Admitting: Oncology

## 2013-04-25 ENCOUNTER — Telehealth: Payer: Self-pay | Admitting: Oncology

## 2013-04-25 NOTE — Telephone Encounter (Signed)
Changed time of 6/16 appt to 3:30 pm for lb/DM. lmonvm for pt and mailed schedule.

## 2013-05-09 ENCOUNTER — Ambulatory Visit: Payer: Medicare Other | Admitting: Oncology

## 2013-05-09 ENCOUNTER — Other Ambulatory Visit: Payer: Medicare Other | Admitting: Lab

## 2013-05-23 ENCOUNTER — Ambulatory Visit (HOSPITAL_BASED_OUTPATIENT_CLINIC_OR_DEPARTMENT_OTHER): Payer: Medicare Other | Admitting: Oncology

## 2013-05-23 ENCOUNTER — Other Ambulatory Visit (HOSPITAL_BASED_OUTPATIENT_CLINIC_OR_DEPARTMENT_OTHER): Payer: Medicare Other | Admitting: Lab

## 2013-05-23 ENCOUNTER — Encounter: Payer: Self-pay | Admitting: Oncology

## 2013-05-23 VITALS — BP 160/77 | HR 77 | Temp 97.1°F | Resp 18 | Ht 70.0 in | Wt 174.0 lb

## 2013-05-23 DIAGNOSIS — D472 Monoclonal gammopathy: Secondary | ICD-10-CM

## 2013-05-23 DIAGNOSIS — D696 Thrombocytopenia, unspecified: Secondary | ICD-10-CM

## 2013-05-23 LAB — COMPREHENSIVE METABOLIC PANEL (CC13)
ALT: 12 U/L (ref 0–55)
Alkaline Phosphatase: 89 U/L (ref 40–150)
CO2: 23 mEq/L (ref 22–29)
Creatinine: 0.8 mg/dL (ref 0.7–1.3)
Glucose: 85 mg/dl (ref 70–99)
Total Bilirubin: 0.37 mg/dL (ref 0.20–1.20)

## 2013-05-23 LAB — CBC WITH DIFFERENTIAL/PLATELET
BASO%: 0.5 % (ref 0.0–2.0)
Basophils Absolute: 0 10*3/uL (ref 0.0–0.1)
EOS%: 9.3 % — ABNORMAL HIGH (ref 0.0–7.0)
Eosinophils Absolute: 0.9 10*3/uL — ABNORMAL HIGH (ref 0.0–0.5)
HGB: 13.7 g/dL (ref 13.0–17.1)
MCV: 91.2 fL (ref 79.3–98.0)
MONO#: 0.6 10*3/uL (ref 0.1–0.9)
NEUT#: 5.6 10*3/uL (ref 1.5–6.5)
NEUT%: 60.1 % (ref 39.0–75.0)
Platelets: 99 10*3/uL — ABNORMAL LOW (ref 140–400)
RDW: 13.4 % (ref 11.0–14.6)
WBC: 9.4 10*3/uL (ref 4.0–10.3)

## 2013-05-23 LAB — IGG, IGA, IGM
IgG (Immunoglobin G), Serum: 1080 mg/dL (ref 650–1600)
IgM, Serum: 45 mg/dL (ref 41–251)

## 2013-05-23 LAB — LACTATE DEHYDROGENASE (CC13): LDH: 128 U/L (ref 125–245)

## 2013-05-23 NOTE — Progress Notes (Signed)
This office note has been dictated.  #[id number]

## 2013-05-24 ENCOUNTER — Telehealth: Payer: Self-pay | Admitting: Oncology

## 2013-05-24 NOTE — Progress Notes (Signed)
CC:   [name] Abed, MD, [facility] Rosalyn Gess. Norins, MD Kerin Perna, M.D.  PROBLEM LIST:  1. 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 The patient has never had  a bone marrow exam. Platelets tend to fluctuate. On one occasion,  06/17/2010, the platelet count was 20,000. However, 1 week later  on 06/24/2010, it was 110,000. For the most part, platelets  fluctuate between 50 and 100,000. The patient has not had any  undue bleeding.  2. 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 Urine immunofixation electrophoresis is  positive for monoclonal protein.  3. Coronary artery disease, status post myocardial infarction, dating  back to 1993. The patient underwent CABG on 04/10/2003.  4. Hypertension.  5. Dyslipidemia.  6. Benign prostate hypertrophy.  7. 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  8. History of kidney stones.  9. GERD. 10. Tubular adenomas.  Most recent colonoscopy was on 02/15/2013.    MEDICATIONS:  Reviewed and recorded. Current Outpatient Prescriptions  Medication Sig Dispense Refill  . aspirin 81 MG tablet Take 81 mg by mouth daily.        . diazepam (VALIUM) 10 MG tablet Take 10 mg by mouth every 6 (six) hours as needed.        . docusate sodium (COLACE) 100 MG capsule Take 100 mg by mouth 2 (two) times daily.       . DUREZOL 0.05 % EMUL as directed.      Marland Kitchen HYDROcodone-acetaminophen (NORCO) 5-325 MG per tablet Take 1 tablet by mouth as needed.        Marland Kitchen lisinopril (PRINIVIL,ZESTRIL) 10 MG tablet Take 1 tablet (10 mg total) by mouth daily.  90 tablet  2  . metoprolol (LOPRESSOR) 50 MG tablet Take 1 tablet (50 mg total) by mouth 2 (two) times daily.  180 tablet  2  . Multiple Vitamins-Minerals (CENTRUM SILVER ULTRA MENS PO) Take by mouth daily.        Marland Kitchen omeprazole (PRILOSEC) 20 MG capsule Take 20 mg by mouth daily.        . simvastatin (ZOCOR) 40  MG tablet Take 1 tablet (40 mg total) by mouth at bedtime.  90 tablet  0  . tamsulosin (FLOMAX) 0.4 MG CAPS Take 0.4 mg by mouth daily.      . traMADol (ULTRAM) 50 MG tablet Take 50 mg by mouth every 6 (six) hours as needed. Maximum dose= 8 tablets per day        No current facility-administered medications for this visit.    SMOKING HISTORY:  The patient has smoked a pack and a half to 2 packs of cigarettes for many years.  He says that he is currently smoking a pack and a half of cigarettes per week.   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  His monoclonal gammopathy dates back almost 10 years to September 2004.  The patient was last seen by [place] on 01/16/2012.  He denies any major changes in his health.  He did have a colonoscopy by Dr. Melvia Heaps on 02/15/2013 that showed tubular adenomas.  I believe he is having another colonoscopy in 3 years.  Patient's main problem is chronic back pain due to degenerative disk disease.  I believe  he does receive steroid injections from Dr. Sheran Luz.  The patient bruises easily. 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 He denies any other problems or any change in his medical status.   PHYSICAL EXAMINATION:  General:  The patient shows no major changes.  He is in good spirits, feels well.  Weight is 174 pounds, height 5 feet 10 inches, body surface area 1.97 sq m.  Vital Signs:  Blood pressure 160/77.  O2 saturation on room air at rest was 99%.  HEENT:  There is no scleral icterus.  Mouth and pharynx are benign.  There is no peripheral adenopathy palpable.  Heart and lungs:  Normal.  Abdomen:  Benign with no organomegaly or masses palpable.  Extremities:  No peripheral edema or clubbing.   The patient uses a cane and walks with a pronounced limp. He has some bruising over his left arm in particular associated with trauma.  He has some chronic hemosiderin deposits in his arms.  LABORATORY DATA:  White count today 9.4, ANC 5.6, hemoglobin 13.7, hematocrit 40.4, platelets 99,000.  Platelet count tends to run in the range of 60-110, although on one occasion on 06/17/2010 platelet count was recorded as 20,000.  I do not believe we have ever treated the patient for what we believe is chronic ITP.  Chemistries today are normal.  Albumin 3.6, LDH 128.  BUN 16, creatinine 0.8.  Quantitative immunoglobulins are pending.  On 01/16/2012 IgG level was 1200, IgA 99, and IgM 55, again fairly stable.   IMAGING STUDIES:  1. Limited ultrasound of the spleen on 03/10/2005: The spleen  measures 10.7 x 4.2 x 3.9 cm and appears to be within normal limits  for size with a calculated volume of 93 cubic centimeters.  2. Chest x-ray, 2 view, from 01/01/2010: Previous CABG with no active  disease.  3. CT scan of abdomen and pelvis without IV contrast shows mild left  hydronephrosis and perinephric stranding. A questionable 1-2 mm  stone at the left ureterovesical junction. Visualization was  difficult due to the beam hardening artifact from the right hip  replacement.  4. Lumbar myelogram on 08/02/2010 shows scoliosis convex to the right  with apex at L2-3. There was disk degeneration throughout the  lumbar spine, more pronounced on the right at L4-5 and L5-S1 and on  the left at L2-3. There is endplate sclerosis and there are  endplate Schmorl's nodes that could be associated with mechanical  back pain. There was a benign-appearing hemangioma within the  right side of the L3 vertebral body.   IMPRESSION AND PLAN:  [name] continues to do well.  There have been no significant changes in his condition.  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  He has an IgG lambda monoclonal gammopathy which over the last 10 years has appeared to be stable and benign.  We will plan to obtain a 24-hour urine for protein and a urine immunofixation electrophoresis.  I have asked the patient to return in 1 year, at which time we will check CBC, chemistries, and quantitative immunoglobulins.  As I have in the past, I have tried to encourage the patient to a stop smoking entirely.  I have also suggested that he may want to try e- cigarettes.    ______________________________ Samul Dada, M.D. DSM/MEDQ  D:  05/23/2013  T:  05/24/2013  Job:  [id number]

## 2013-06-01 ENCOUNTER — Ambulatory Visit: Payer: Medicare Other | Admitting: Lab

## 2013-06-01 DIAGNOSIS — D472 Monoclonal gammopathy: Secondary | ICD-10-CM

## 2013-06-03 ENCOUNTER — Other Ambulatory Visit: Payer: Self-pay

## 2013-06-03 LAB — UIFE/LIGHT CHAINS/TP QN, 24-HR UR
Albumin, U: DETECTED
Alpha 1, Urine: DETECTED — AB
Alpha 2, Urine: DETECTED — AB
Beta, Urine: DETECTED — AB
Free Kappa/Lambda Ratio: 0.97 ratio — ABNORMAL LOW (ref 2.04–10.37)
Free Lt Chn Excr Rate: 12.35 mg/d
Total Protein, Urine-Ur/day: 40 mg/d (ref 10–140)
Total Protein, Urine: 2.1 mg/dL

## 2013-06-03 MED ORDER — SIMVASTATIN 40 MG PO TABS: 40.0000 mg | ORAL_TABLET | Freq: Every day | ORAL | Status: DC

## 2013-07-29 ENCOUNTER — Telehealth: Payer: Self-pay | Admitting: Cardiology

## 2013-07-29 NOTE — Telephone Encounter (Signed)
Mary with Dr. Champ Mungo of  [facility] : please fax the patient's most recent ekg to [phone]/djc

## 2013-08-03 ENCOUNTER — Ambulatory Visit: Payer: Self-pay | Admitting: Ophthalmology

## 2013-08-25 ENCOUNTER — Other Ambulatory Visit: Payer: Self-pay

## 2013-08-25 MED ORDER — SIMVASTATIN 40 MG PO TABS: 40.0000 mg | ORAL_TABLET | Freq: Every day | ORAL | Status: DC

## 2013-08-26 ENCOUNTER — Other Ambulatory Visit: Payer: Self-pay

## 2013-08-26 MED ORDER — LISINOPRIL 10 MG PO TABS: 10.0000 mg | ORAL_TABLET | Freq: Every day | ORAL | Status: DC

## 2013-10-20 ENCOUNTER — Other Ambulatory Visit: Payer: Self-pay

## 2013-10-20 MED ORDER — METOPROLOL TARTRATE 50 MG PO TABS: 50.0000 mg | ORAL_TABLET | Freq: Two times a day (BID) | ORAL | Status: DC

## 2014-01-16 ENCOUNTER — Encounter: Payer: Self-pay | Admitting: Cardiology

## 2014-01-16 ENCOUNTER — Ambulatory Visit (INDEPENDENT_AMBULATORY_CARE_PROVIDER_SITE_OTHER): Payer: Medicare Other | Admitting: Cardiology

## 2014-01-16 VITALS — BP 132/86 | HR 65 | Ht 70.0 in | Wt 175.0 lb

## 2014-01-16 DIAGNOSIS — E785 Hyperlipidemia, unspecified: Secondary | ICD-10-CM

## 2014-01-16 DIAGNOSIS — I10 Essential (primary) hypertension: Secondary | ICD-10-CM

## 2014-01-16 DIAGNOSIS — I739 Peripheral vascular disease, unspecified: Secondary | ICD-10-CM

## 2014-01-16 DIAGNOSIS — I779 Disorder of arteries and arterioles, unspecified: Secondary | ICD-10-CM

## 2014-01-16 DIAGNOSIS — I2510 Atherosclerotic heart disease of native coronary artery without angina pectoris: Secondary | ICD-10-CM

## 2014-01-16 DIAGNOSIS — D696 Thrombocytopenia, unspecified: Secondary | ICD-10-CM

## 2014-01-16 DIAGNOSIS — M549 Dorsalgia, unspecified: Secondary | ICD-10-CM

## 2014-01-16 MED ORDER — METOPROLOL TARTRATE 50 MG PO TABS: 50.0000 mg | ORAL_TABLET | Freq: Two times a day (BID) | ORAL | Status: DC

## 2014-01-16 MED ORDER — DIAZEPAM 10 MG PO TABS: 10.0000 mg | ORAL_TABLET | Freq: Four times a day (QID) | ORAL | Status: DC | PRN

## 2014-01-16 MED ORDER — LISINOPRIL 10 MG PO TABS: 10.0000 mg | ORAL_TABLET | Freq: Every day | ORAL | Status: DC

## 2014-01-16 NOTE — Progress Notes (Signed)
HPI  Patient is seen today to followup coronary disease. I saw him last November, 2013. I have followed him over many years. Over recent years she's been stable. He continues to smoke a few cigarettes daily. He underwent bypass surgery in 2004. Nuclear study in 2011 revealed no ischemia. The ejection fraction was 55%. He's not been having any significant chest pain or shortness of breath.  Allergies  Allergen Reactions  . Penicillins Other (See Comments)    Passed out    Current Outpatient Prescriptions  Medication Sig Dispense Refill  . aspirin 81 MG tablet Take 81 mg by mouth daily.        . diazepam (VALIUM) 10 MG tablet Take 10 mg by mouth every 6 (six) hours as needed.        . docusate sodium (COLACE) 100 MG capsule Take 100 mg by mouth 2 (two) times daily.       Marland Kitchen HYDROcodone-acetaminophen (NORCO) 5-325 MG per tablet Take 1 tablet by mouth as needed.        Marland Kitchen lisinopril (PRINIVIL,ZESTRIL) 10 MG tablet Take 1 tablet (10 mg total) by mouth daily.  90 tablet  0  . metoprolol (LOPRESSOR) 50 MG tablet Take 1 tablet (50 mg total) by mouth 2 (two) times daily.  180 tablet  0  . Multiple Vitamins-Minerals (CENTRUM SILVER ULTRA MENS PO) Take by mouth daily.        Marland Kitchen omeprazole (PRILOSEC) 20 MG capsule Take 20 mg by mouth daily.        . simvastatin (ZOCOR) 40 MG tablet Take 1 tablet (40 mg total) by mouth at bedtime.  90 tablet  3  . tamsulosin (FLOMAX) 0.4 MG CAPS Take 0.4 mg by mouth daily.      . traMADol (ULTRAM) 50 MG tablet Take 50 mg by mouth every 6 (six) hours as needed. Maximum dose= 8 tablets per day        No current facility-administered medications for this visit.    History   Social History  . Marital Status: Married    Spouse Name: N/A    Number of Children: 2  . Years of Education: 12   Occupational History  . trucking     Social History Main Topics  . Smoking status: Light Tobacco Smoker -- 0.01 packs/day for 45 years    Types: Cigarettes  . Smokeless  tobacco: Never Used     Comment: 1-2 cigarettes daily  . Alcohol Use: No  . Drug Use: No  . Sexual Activity: Not Currently   Other Topics Concern  . Not on file   Social History Narrative   HSG, Married '62, 1 son-'70, 1 dtr - '68; 2 grandchildren. Work - Probation officer.   ACP - DNR, DNI, no artificial hydration or feeding, no heroic measures    Family History  Problem Relation Age of Onset  . Kidney disease Mother   . COPD Mother   . Stroke Father     cerebral hemorrhage  . Cancer Paternal Grandfather   . Heart disease Brother   . Arthritis Brother     Past Medical History  Diagnosis Date  . Thrombocytopenia     Dr. Ralene Ok  . Hypertension   . Dyslipidemia   . Ejection fraction     EF 55%, nuclear 2009  . Hx of CABG     2004  . Back pain     10/2010  . RBBB (right bundle branch block with left anterior fascicular  block)   . HTN (hypertension)   . Hx of coronary artery bypass graft 2004    5  vesseel bypass graft  . CAD (coronary artery disease)   . Back pain     November, 2013  . Carotid bruit     November, 2013  . Carotid artery disease     Doppler, November, 2013, 0-39% bilateral  . Cataract   . DDD (degenerative disc disease), lumbar   . Temporary low platelet count     Past Surgical History  Procedure Laterality Date  . Coronary artery bypass graft  2004  . Thr right  Nov '04  . Eye surgery  2013    bilateral cataract extraction w/ IOL (Dr. Celene Squibb)  . Total hip arthroplasty      rigth hip    Patient Active Problem List   Diagnosis Date Noted  . Carotid artery disease   . 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  . Routine health maintenance 11/12/2011  . RBBB (right bundle branch block with left anterior fascicular block)   . Thrombocytopenia   . Hypertension   . Dyslipidemia   . CAD (coronary artery disease)   . Ejection fraction   . Hx of CABG   . Back pain   . TOBACCO ABUSE 01/01/2010  . DYSPEPSIA  01/01/2010    ROS   Patient denies fever, chills, headache, sweats, rash, change in vision, change in hearing, chest pain, cough, nausea vomiting, urinary symptoms. All other systems are reviewed and are negative. However he does have significant back pain. He also has muscle spasms rarely at nighttime requiring Valium to help him relax.  PHYSICAL EXAM  Patient walks with a cane. He is oriented to person time and place. Affect is normal. There is no jugular venous distention. Lungs are clear. Respiratory effort is nonlabored. Cardiac exam reveals S1 and S2. There no clicks or significant murmurs. The abdomen is soft. There is no peripheral edema. There no musculoskeletal deformities. There no skin rashes.  Filed Vitals:   01/16/14 0815  BP: 132/86  Pulse: 65  Height: 5\' 10"  (1.778 m)  Weight: 175 lb (79.379 kg)    EKG  EKG is done today and reviewed by me. He has old right bundle branch block. There is sinus rhythm. There is no change from a prior EKG that I have reviewed.  ASSESSMENT & PLAN

## 2014-01-16 NOTE — Assessment & Plan Note (Signed)
Blood pressure is controlled. No change in therapy. 

## 2014-01-16 NOTE — Assessment & Plan Note (Signed)
He continues to smoke a few cigarettes daily. I have encouraged him to try to stop completely.

## 2014-01-16 NOTE — Assessment & Plan Note (Signed)
Right bundle branch block is old. No change in therapy. 

## 2014-01-16 NOTE — Assessment & Plan Note (Signed)
His carotid in November, 2013 showed only minimal disease. He does not need a followup Doppler at this time.  As part of today's evaluation I spent greater than 25 minutes with his total care. More than half of this time was to with direct contact reviewing all of his issues with him.

## 2014-01-16 NOTE — Assessment & Plan Note (Signed)
The patient's last nuclear study was 2011. He's not having any significant symptoms. No further workup is needed.

## 2014-01-16 NOTE — Assessment & Plan Note (Addendum)
I have reviewed the fact that he is on simvastatin 40. Guidelines would suggest that I should change him to atorvastatin 40. However he has had difficulty with other statins in the past. He has had good LDL control over the years. I have chosen not to change his meds.

## 2014-01-16 NOTE — Patient Instructions (Signed)
Your physician recommends that you continue on your current medications as directed. Please refer to the Current Medication list given to you today.  Your physician wants you to follow-up in: 1 year with Dr. Ron Parker. You will receive a reminder letter in the mail two months in advance. If you don't receive a letter, please call our office to schedule the follow-up appointment.  Refilled your lisinopril, metoprolol and diazepam.

## 2014-01-16 NOTE — Assessment & Plan Note (Signed)
The hematologist he is followed with over the years has retired. He will followup at the [facility] for his thrombocytopenia in 1 year.

## 2014-01-16 NOTE — Assessment & Plan Note (Signed)
Back pain is a significant problem. He also has muscle spasms at night at times. He requires Valium on a very infrequent basis. I will write it prescription with one refill for him.

## 2014-01-17 ENCOUNTER — Telehealth: Payer: Self-pay | Admitting: *Deleted

## 2014-01-17 NOTE — Telephone Encounter (Signed)
PA for valium to [employer] [facility] [employer] for Dr Ron Parker

## 2014-04-11 ENCOUNTER — Ambulatory Visit (INDEPENDENT_AMBULATORY_CARE_PROVIDER_SITE_OTHER): Payer: Medicare Other | Admitting: Urology

## 2014-04-11 DIAGNOSIS — N401 Enlarged prostate with lower urinary tract symptoms: Secondary | ICD-10-CM

## 2014-04-11 DIAGNOSIS — N138 Other obstructive and reflux uropathy: Secondary | ICD-10-CM

## 2014-05-22 ENCOUNTER — Other Ambulatory Visit: Payer: Self-pay | Admitting: Internal Medicine

## 2014-05-22 DIAGNOSIS — D472 Monoclonal gammopathy: Secondary | ICD-10-CM

## 2014-05-23 ENCOUNTER — Telehealth: Payer: Self-pay | Admitting: Internal Medicine

## 2014-05-23 ENCOUNTER — Other Ambulatory Visit (HOSPITAL_BASED_OUTPATIENT_CLINIC_OR_DEPARTMENT_OTHER): Payer: Medicare Other

## 2014-05-23 ENCOUNTER — Ambulatory Visit (HOSPITAL_BASED_OUTPATIENT_CLINIC_OR_DEPARTMENT_OTHER): Payer: Medicare Other | Admitting: Internal Medicine

## 2014-05-23 VITALS — BP 151/71 | HR 60 | Temp 97.6°F | Resp 18 | Ht 70.0 in | Wt 172.9 lb

## 2014-05-23 DIAGNOSIS — I2510 Atherosclerotic heart disease of native coronary artery without angina pectoris: Secondary | ICD-10-CM

## 2014-05-23 DIAGNOSIS — F17200 Nicotine dependence, unspecified, uncomplicated: Secondary | ICD-10-CM

## 2014-05-23 DIAGNOSIS — D472 Monoclonal gammopathy: Secondary | ICD-10-CM

## 2014-05-23 DIAGNOSIS — D696 Thrombocytopenia, unspecified: Secondary | ICD-10-CM

## 2014-05-23 LAB — CBC WITH DIFFERENTIAL/PLATELET
BASO%: 0.7 % (ref 0.0–2.0)
BASOS ABS: 0.1 10*3/uL (ref 0.0–0.1)
EOS%: 7.6 % — ABNORMAL HIGH (ref 0.0–7.0)
Eosinophils Absolute: 0.7 10*3/uL — ABNORMAL HIGH (ref 0.0–0.5)
HCT: 41.7 % (ref 38.4–49.9)
HGB: 13.9 g/dL (ref 13.0–17.1)
LYMPH%: 22 % (ref 14.0–49.0)
MCH: 30.7 pg (ref 27.2–33.4)
MCHC: 33.3 g/dL (ref 32.0–36.0)
MCV: 92.3 fL (ref 79.3–98.0)
MONO#: 0.5 10*3/uL (ref 0.1–0.9)
MONO%: 5.6 % (ref 0.0–14.0)
NEUT#: 5.5 10*3/uL (ref 1.5–6.5)
NEUT%: 64.1 % (ref 39.0–75.0)
Platelets: 104 10*3/uL — ABNORMAL LOW (ref 140–400)
RBC: 4.52 10*6/uL (ref 4.20–5.82)
RDW: 13.5 % (ref 11.0–14.6)
WBC: 8.6 10*3/uL (ref 4.0–10.3)
lymph#: 1.9 10*3/uL (ref 0.9–3.3)

## 2014-05-23 LAB — COMPREHENSIVE METABOLIC PANEL (CC13)
ALK PHOS: 80 U/L (ref 40–150)
ALT: 20 U/L (ref 0–55)
AST: 18 U/L (ref 5–34)
Albumin: 3.7 g/dL (ref 3.5–5.0)
Anion Gap: 7 mEq/L (ref 3–11)
BILIRUBIN TOTAL: 0.48 mg/dL (ref 0.20–1.20)
BUN: 13.3 mg/dL (ref 7.0–26.0)
CO2: 25 mEq/L (ref 22–29)
Calcium: 9.2 mg/dL (ref 8.4–10.4)
Chloride: 107 mEq/L (ref 98–109)
Creatinine: 0.8 mg/dL (ref 0.7–1.3)
Glucose: 97 mg/dl (ref 70–140)
Potassium: 4.2 mEq/L (ref 3.5–5.1)
Sodium: 139 mEq/L (ref 136–145)
Total Protein: 6.9 g/dL (ref 6.4–8.3)

## 2014-05-23 LAB — LACTATE DEHYDROGENASE (CC13): LDH: 133 U/L (ref 125–245)

## 2014-05-23 NOTE — Patient Instructions (Signed)
Thrombocytopenia Thrombocytopenia is a condition in which there is an abnormally small number of platelets in your blood. Platelets are also called thrombocytes. Platelets are needed for blood clotting. CAUSES Thrombocytopenia is caused by:   Decreased production of platelets. This can be caused by:  Aplastic anemia in which your bone marrow quits making blood cells.  Cancer in the bone marrow.  Use of certain medicines, including chemotherapy.  Infection in the bone marrow.  Heavy alcohol consumption.  Increased destruction of platelets. This can be caused by:  Certain immune diseases.  Use of certain drugs.  Certain blood clotting disorders.  Certain inherited disorders.  Certain bleeding disorders.  Pregnancy.  Having an enlarged spleen (hypersplenism). In hypersplenism, the spleen gathers up platelets from circulation. This means the platelets are not available to help with blood clotting. The spleen can enlarge due to cirrhosis or other conditions. SYMPTOMS  The symptoms of thrombocytopenia are side effects of poor blood clotting. Some of these are:  Abnormal bleeding.  Nosebleeds.  Heavy menstrual periods.  Blood in the urine or stools.  Purpura. This is a purplish discoloration in the skin produced by small bleeding vessels near the surface of the skin.  Bruising.  A rash that may be petechial. This looks like pinpoint, purplish-red spots on the skin and mucous membranes. It is caused by bleeding from small blood vessels (capillaries). DIAGNOSIS  Your caregiver will make this diagnosis based on your exam and blood tests. Sometimes, a bone marrow study is done to look for the original cells (megakaryocytes) that make platelets. TREATMENT  Treatment depends on the cause of the condition.  Medicines may be given to help protect your platelets from being destroyed.  In some cases, a replacement (transfusion) of platelets may be required to stop or prevent  bleeding.  Sometimes, the spleen must be surgically removed. HOME CARE INSTRUCTIONS   Check the skin and linings inside your mouth for bruising or bleeding as directed by your caregiver.  Check your sputum, urine, and stool for blood as directed by your caregiver.  Do not return to any activities that could cause bumps or bruises until your caregiver says it is okay.  Take extra care not to cut yourself when shaving or when using scissors, needles, knives, and other tools.  Take extra care not to burn yourself when ironing or cooking.  Ask your caregiver if it is okay for you to drink alcohol.  Only take over-the-counter or prescription medicines as directed by your caregiver.  Notify all your caregivers, including dentists and eye doctors, about your condition. SEEK IMMEDIATE MEDICAL CARE IF:   You develop active bleeding from anywhere in your body.  You develop unexplained bruising or bleeding.  You have blood in your sputum, urine, or stool. MAKE SURE YOU:  Understand these instructions.  Will watch your condition.  Will get help right away if you are not doing well or get worse. Document Released: 11/24/2005 Document Revised: 02/16/2012 Document Reviewed: 09/26/2011 [employer] Patient Information 2014 [employer].  

## 2014-05-23 NOTE — Progress Notes (Signed)
[place] OFFICE PROGRESS NOTE  Adella Hare, MD [address] [address]  DIAGNOSIS: MGUS (monoclonal gammopathy of unknown significance)  Thrombocytopenia  Chief Complaint  Patient presents with  . Thrombocytopenia    CURRENT TREATMENT:  Observation.   INTERVAL HISTORY: [name] [name] 73 y.o. 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  He was last seen by 05/23/2013.  He saw his urologist for BPH last month with changes in his medication.  He notes improvement.  His most recent colonoscopy was on 02/15/2013 with tubular adenomas.  He denies any emergency room visits or hospitalizations. He reports activity with limitation with his back.  He ambulates with a cane.  He smokes about one pack per week. He has chronic back pain due to degenerative disk disease.  He receives steroid injections as needed with the last one 11/2013.    MEDICAL HISTORY: Past Medical History  Diagnosis Date  . Thrombocytopenia     Dr. Ralene Ok  . Hypertension   . Dyslipidemia   . Ejection fraction     EF 55%, nuclear 2009  . Hx of CABG     2004  . Back pain     10/2010  . RBBB (right bundle branch block with left anterior fascicular block)   . HTN (hypertension)   . Hx of coronary artery bypass graft 2004    5  vesseel bypass graft  . CAD (coronary artery disease)   . Back pain     November, 2013  . Carotid bruit     November, 2013  . Carotid artery disease     Doppler, November, 2013, 0-39% bilateral  . Cataract   . DDD (degenerative disc disease), lumbar   . Temporary low platelet count     INTERIM HISTORY: has TOBACCO ABUSE; DYSPEPSIA; RBBB (right bundle branch block with left anterior fascicular block); Thrombocytopenia; Hypertension; Dyslipidemia; CAD (coronary artery disease); Ejection fraction; Hx of CABG; Back pain; Routine health maintenance; MGUS (monoclonal gammopathy  of unknown significance); and Carotid artery disease on his problem list.    ALLERGIES:  is allergic to penicillins.  MEDICATIONS: has a current medication list which includes the following prescription(s): aspirin, diazepam, docusate sodium, esomeprazole, finasteride, hydrocodone-acetaminophen, lisinopril, metoprolol, multiple vitamins-minerals, simvastatin, tamsulosin, and tramadol.  SURGICAL HISTORY:  Past Surgical History  Procedure Laterality Date  . Coronary artery bypass graft  2004  . Thr right  Nov '04  . Eye surgery  2013    bilateral cataract extraction w/ IOL (Dr. Celene Squibb)  . Total hip arthroplasty      rigth hip   PROBLEM LIST:  1. 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 The patient has never had  a bone marrow exam. Platelets tend to fluctuate. On one occasion,  06/17/2010, the platelet count was 20,000. However, 1 week later  on 06/24/2010, it was 110,000. For the most part, platelets  fluctuate between 50 and 100,000. The patient has not had any  undue bleeding.  2. 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 Urine immunofixation electrophoresis is  positive for monoclonal protein.  3. Coronary artery disease, status post myocardial infarction, dating  back to 1993. The patient underwent CABG on 04/10/2003.  4. Hypertension.  5. Dyslipidemia.  6. Benign prostate hypertrophy.  7. 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  8. History of kidney stones.  9. GERD.  10. Tubular adenomas. Most recent colonoscopy was on 02/15/2013.  REVIEW OF SYSTEMS:   Constitutional: Denies fevers, chills or abnormal weight loss Eyes: Denies blurriness of vision Ears, nose, mouth, throat, and face: Denies mucositis or sore throat Respiratory: Denies cough, dyspnea or wheezes Cardiovascular: Denies palpitation, chest discomfort or lower extremity  swelling Gastrointestinal:  Denies nausea, heartburn or change in bowel habits Skin: Denies abnormal skin rashes Lymphatics: Denies new lymphadenopathy or easy bruising Neurological:Denies numbness, tingling or new weaknesses Behavioral/Psych: Mood is stable, no new changes  All other systems were reviewed with the patient and are negative.  PHYSICAL EXAMINATION: ECOG PERFORMANCE STATUS: 1 - Symptomatic but completely ambulatory  Blood pressure 151/71, pulse 60, temperature 97.6 F (36.4 C), temperature source Oral, resp. rate 18, height 5\' 10"  (1.778 m), weight 172 lb 14.4 oz (78.427 kg), SpO2 100.00%.  GENERAL:alert, no distress and comfortable; ambulates via cane.  SKIN: skin color, texture, turgor are normal, no rashes or significant lesions EYES: normal, Conjunctiva are pink and non-injected, sclera clear OROPHARYNX:no exudate, no erythema and lips, buccal mucosa, and tongue normal  NECK: supple, thyroid normal size, non-tender, without nodularity LYMPH:  no palpable lymphadenopathy in the cervical, axillary or supraclavicular LUNGS: clear to auscultation with normal breathing effort, no wheezes or rhonchi HEART: regular rate & rhythm and no murmurs and no lower extremity edema ABDOMEN:abdomen soft, non-tender and normal bowel sounds Musculoskeletal:no cyanosis of digits and no clubbing  NEURO: alert & oriented x 3 with fluent speech, no focal motor/sensory deficits  Labs:  Lab Results  Component Value Date   WBC 8.6 05/23/2014   HGB 13.9 05/23/2014   HCT 41.7 05/23/2014   MCV 92.3 05/23/2014   PLT 104* 05/23/2014   NEUTROABS 5.5 05/23/2014      Chemistry      Component Value Date/Time   NA 140 05/23/2013 1514   NA 139 01/16/2012 1322   K 3.6 05/23/2013 1514   K 4.1 01/16/2012 1322   CL 108* 05/23/2013 1514   CL 105 01/16/2012 1322   CO2 23 05/23/2013 1514   CO2 26 01/16/2012 1322   BUN 15.7 05/23/2013 1514   BUN 13 01/16/2012 1322   CREATININE 0.8 05/23/2013 1514   CREATININE 1.04  01/16/2012 1322      Component Value Date/Time   CALCIUM 9.3 05/23/2013 1514   CALCIUM 9.3 01/16/2012 1322   ALKPHOS 89 05/23/2013 1514   ALKPHOS 71 12/06/2012 1424   AST 15 05/23/2013 1514   AST 21 12/06/2012 1424   ALT 12 05/23/2013 1514   ALT 19 12/06/2012 1424   BILITOT 0.37 05/23/2013 1514   BILITOT 0.6 12/06/2012 1424     CBC:  Recent Labs Lab 05/23/14 0950  WBC 8.6  NEUTROABS 5.5  HGB 13.9  HCT 41.7  MCV 92.3  PLT 104*    Anemia work up No results found for this basename: VITAMINB12, FOLATE, FERRITIN, TIBC, IRON, RETICCTPCT,  in the last 72 hours  Studies:  No results found.   RADIOGRAPHIC STUDIES: No results found.  ASSESSMENT: [name] [name] 73 y.o. male with a history of MGUS (monoclonal gammopathy of unknown significance)  Thrombocytopenia   PLAN:   1. ITP. --Mr. Springer continues to do well.  His plts are 104 today.  There have been  no significant changes in his condition. 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  2.  IgG Lambda MGUS. --  He has an IgG lambda monoclonal gammopathy which over the last 10 years has appeared to be stable and benign.  We will await his MM markers today.    3. Tobacco Abuse. -- I have tried to encourage the patient to a stop smoking entirely. I have also suggested that he may want to try e-cigarettes.  He declined referral to smoking cessation classes.   4. Follow-up. --I have asked the patient to return in 1 year, at which time we will check CBC, chemistries, and quantitative immunoglobulins.     All questions were answered. The patient knows to call the clinic with any problems, questions or concerns. We can certainly see the patient much sooner if necessary.  I spent 15 minutes counseling the patient face to face. The total time spent in the appointment was 25 minutes.    CHISM, DAVID, MD 05/23/2014 10:23 AM

## 2014-05-23 NOTE — Telephone Encounter (Signed)
, °

## 2014-05-24 LAB — KAPPA/LAMBDA LIGHT CHAINS
KAPPA FREE LGHT CHN: 1.88 mg/dL (ref 0.33–1.94)
Kappa:Lambda Ratio: 0.07 — ABNORMAL LOW (ref 0.26–1.65)
LAMBDA FREE LGHT CHN: 25.4 mg/dL — AB (ref 0.57–2.63)

## 2014-05-24 LAB — IGG, IGA, IGM
IGA: 73 mg/dL (ref 68–379)
IGG (IMMUNOGLOBIN G), SERUM: 925 mg/dL (ref 650–1600)
IGM, SERUM: 38 mg/dL — AB (ref 41–251)

## 2014-07-11 ENCOUNTER — Ambulatory Visit (INDEPENDENT_AMBULATORY_CARE_PROVIDER_SITE_OTHER): Payer: Medicare Other | Admitting: Urology

## 2014-07-11 DIAGNOSIS — N138 Other obstructive and reflux uropathy: Secondary | ICD-10-CM

## 2014-07-11 DIAGNOSIS — N401 Enlarged prostate with lower urinary tract symptoms: Secondary | ICD-10-CM

## 2014-07-27 ENCOUNTER — Other Ambulatory Visit: Payer: Self-pay | Admitting: *Deleted

## 2014-07-27 MED ORDER — SIMVASTATIN 40 MG PO TABS: 40.0000 mg | ORAL_TABLET | Freq: Every day | ORAL | Status: DC

## 2014-10-13 ENCOUNTER — Other Ambulatory Visit (HOSPITAL_COMMUNITY): Payer: Self-pay | Admitting: *Deleted

## 2014-10-13 DIAGNOSIS — I6523 Occlusion and stenosis of bilateral carotid arteries: Secondary | ICD-10-CM

## 2014-10-26 ENCOUNTER — Telehealth: Payer: Self-pay | Admitting: Cardiology

## 2014-10-27 NOTE — Telephone Encounter (Signed)
Walk-In Patient Form completed at front reception on 11.19.15: received in the HIM department on 11.20.15: Given to [facility] V/ Dr. Ron Parker on 11.20.15:djc

## 2014-10-31 ENCOUNTER — Ambulatory Visit (HOSPITAL_COMMUNITY): Payer: Medicare Other | Attending: Cardiology | Admitting: *Deleted

## 2014-10-31 ENCOUNTER — Telehealth: Payer: Self-pay

## 2014-10-31 ENCOUNTER — Telehealth: Payer: Self-pay | Admitting: Cardiology

## 2014-10-31 DIAGNOSIS — Z95818 Presence of other cardiac implants and grafts: Secondary | ICD-10-CM | POA: Diagnosis not present

## 2014-10-31 DIAGNOSIS — Z720 Tobacco use: Secondary | ICD-10-CM | POA: Insufficient documentation

## 2014-10-31 DIAGNOSIS — E785 Hyperlipidemia, unspecified: Secondary | ICD-10-CM | POA: Diagnosis not present

## 2014-10-31 DIAGNOSIS — I6523 Occlusion and stenosis of bilateral carotid arteries: Secondary | ICD-10-CM | POA: Insufficient documentation

## 2014-10-31 DIAGNOSIS — I2510 Atherosclerotic heart disease of native coronary artery without angina pectoris: Secondary | ICD-10-CM | POA: Diagnosis present

## 2014-10-31 NOTE — Telephone Encounter (Signed)
The pt walked into the office this morning and requested a refill on his NTG.  NTG is not on the pts medications list and it is not listed in his medication history. Is it okay to fill? Please advise.

## 2014-10-31 NOTE — Telephone Encounter (Signed)
Walk in Pt Form " NTG Called in" It has Expired gave to [place] V/KM

## 2014-10-31 NOTE — Progress Notes (Signed)
Carotid Duplex Performed 

## 2014-11-01 NOTE — Telephone Encounter (Signed)
Yes OK

## 2014-11-06 MED ORDER — NITROGLYCERIN 0.4 MG SL SUBL: 0.4000 mg | SUBLINGUAL_TABLET | SUBLINGUAL | Status: DC | PRN

## 2014-11-06 NOTE — Telephone Encounter (Signed)
The pt is aware that I have sent his NTG RX to [facility] on [address].

## 2014-11-07 ENCOUNTER — Telehealth: Payer: Self-pay

## 2014-11-07 ENCOUNTER — Other Ambulatory Visit: Payer: Self-pay

## 2014-11-07 MED ORDER — DIAZEPAM 10 MG PO TABS: 10.0000 mg | ORAL_TABLET | Freq: Four times a day (QID) | ORAL | Status: DC | PRN

## 2014-11-07 NOTE — Telephone Encounter (Signed)
I am OK with the Valium refill. But will you please personally push primary care to give him a new Doctor at the site of his choice NOW. NO MANY MONTH DELAYS. This is part of the poor job primary care did with Norin's retirement. If this can not be worked out, have head doctor of primary call call me on my cell.

## 2014-11-07 NOTE — Telephone Encounter (Signed)
**Note De-Identified [name] [name] Obfuscation** I called [facility] and scheduled the pt an appt with Dr Doug Sou on 01/19/15 @ 2 pm. The pt is aware and he agrees with date and time of appt. Since Dr Ron Parker is not in the office today Richardson Dopp, PAc  signed the pts RX for Valium 10 mg to be taken as needed #30 with no refills. The pt states that he will come to the office to pick up his prescription for Valium.

## 2014-11-07 NOTE — Telephone Encounter (Addendum)
**Note De-Identified [name] [name] Obfuscation** I called the pt to give him the results of his Carotid Duplex and while on the phone the pt asked for a refill on his Valium 10 mg to be taken q6hrs prn because Dr Ron Parker allowed him a refill of #30 with no refills at the pts last OV on 01/16/14. I advised the pt to refer to his PCP for that refill but he states that his PCP, Dr Linda Hedges, has retired. I advised him to obtain a new PCP as Dr Ron Parker is his cardiologist and is treating him for his cardiac needs. He verbalized understanding and states that he will work on getting a new PCP but in the mean time he is requesting a refill on his Valium from Dr Ron Parker. He is aware that I am sending this message to Dr Ron Parker for his advise.

## 2014-11-07 NOTE — Telephone Encounter (Signed)
Rx signed for Valium today in Dr. Kae Heller absence. Richardson Dopp, PA-C   11/07/2014 4:53 PM

## 2015-01-10 ENCOUNTER — Other Ambulatory Visit: Payer: Self-pay | Admitting: Cardiology

## 2015-01-19 ENCOUNTER — Encounter: Payer: Self-pay | Admitting: Internal Medicine

## 2015-01-19 ENCOUNTER — Ambulatory Visit (INDEPENDENT_AMBULATORY_CARE_PROVIDER_SITE_OTHER): Payer: Medicare Other | Admitting: Internal Medicine

## 2015-01-19 VITALS — BP 132/80 | HR 79 | Temp 98.4°F | Resp 16 | Ht 70.0 in | Wt 172.8 lb

## 2015-01-19 DIAGNOSIS — E785 Hyperlipidemia, unspecified: Secondary | ICD-10-CM

## 2015-01-19 DIAGNOSIS — I10 Essential (primary) hypertension: Secondary | ICD-10-CM

## 2015-01-19 DIAGNOSIS — Z23 Encounter for immunization: Secondary | ICD-10-CM

## 2015-01-19 DIAGNOSIS — D472 Monoclonal gammopathy: Secondary | ICD-10-CM

## 2015-01-19 DIAGNOSIS — N401 Enlarged prostate with lower urinary tract symptoms: Secondary | ICD-10-CM

## 2015-01-19 DIAGNOSIS — M5442 Lumbago with sciatica, left side: Secondary | ICD-10-CM

## 2015-01-19 DIAGNOSIS — Z0000 Encounter for general adult medical examination without abnormal findings: Secondary | ICD-10-CM

## 2015-01-19 DIAGNOSIS — F17200 Nicotine dependence, unspecified, uncomplicated: Secondary | ICD-10-CM

## 2015-01-19 DIAGNOSIS — M5441 Lumbago with sciatica, right side: Secondary | ICD-10-CM

## 2015-01-19 DIAGNOSIS — R338 Other retention of urine: Secondary | ICD-10-CM

## 2015-01-19 NOTE — Patient Instructions (Signed)
We will not check your blood work today but will check on the results from the summer from the hematology this summer. We will not change the medicines today.  If you are having any problems please call our office.   Work on stopping smoking as this increases your risk of lung and heart problems. We do have medicines that can help you quit smoking if you are interested.   We will see you back in about 6 months to check on your health and go over your blood work.   Exercise to Stay Healthy Exercise helps you become and stay healthy. EXERCISE IDEAS AND TIPS Choose exercises that:  You enjoy.  Fit into your day. You do not need to exercise really hard to be healthy. You can do exercises at a slow or medium level and stay healthy. You can:  Stretch before and after working out.  Try yoga, Pilates, or tai chi.  Lift weights.  Walk fast, swim, jog, run, climb stairs, bicycle, dance, or rollerskate.  Take aerobic classes. Exercises that burn about 150 calories:  Running 1  miles in 15 minutes.  Playing volleyball for 45 to 60 minutes.  Washing and waxing a car for 45 to 60 minutes.  Playing touch football for 45 minutes.  Walking 1  miles in 35 minutes.  Pushing a stroller 1  miles in 30 minutes.  Playing basketball for 30 minutes.  Raking leaves for 30 minutes.  Bicycling 5 miles in 30 minutes.  Walking 2 miles in 30 minutes.  Dancing for 30 minutes.  Shoveling snow for 15 minutes.  Swimming laps for 20 minutes.  Walking up stairs for 15 minutes.  Bicycling 4 miles in 15 minutes.  Gardening for 30 to 45 minutes.  Jumping rope for 15 minutes.  Washing windows or floors for 45 to 60 minutes. Document Released: 12/27/2010 Document Revised: 02/16/2012 Document Reviewed: 12/27/2010 [facility] Patient Information 2015 [place], [place]. This information is not intended to replace advice given to you by your health care provider. Make sure you discuss any questions  you have with your health care provider.

## 2015-01-19 NOTE — Progress Notes (Signed)
Pre visit review using our clinic review tool, if applicable. No additional management support is needed unless otherwise documented below in the visit note. 

## 2015-01-20 ENCOUNTER — Encounter: Payer: Self-pay | Admitting: Internal Medicine

## 2015-01-20 DIAGNOSIS — R338 Other retention of urine: Secondary | ICD-10-CM

## 2015-01-20 DIAGNOSIS — N401 Enlarged prostate with lower urinary tract symptoms: Secondary | ICD-10-CM | POA: Insufficient documentation

## 2015-01-20 DIAGNOSIS — N400 Enlarged prostate without lower urinary tract symptoms: Secondary | ICD-10-CM | POA: Insufficient documentation

## 2015-01-20 DIAGNOSIS — Z0000 Encounter for general adult medical examination without abnormal findings: Secondary | ICD-10-CM | POA: Insufficient documentation

## 2015-01-20 NOTE — Assessment & Plan Note (Signed)
BP at goal today and reviewed recent BMP. He is on finasteride, flomax, lopressor, lisinopril. Not on maximal dosing of lisinopril so may be worthwhile to titrate up for his heart in the future.

## 2015-01-20 NOTE — Assessment & Plan Note (Signed)
This is why he takes the valium intermittently. Unclear if this is the best choice for his pain but can continue for now. He is not taking it frequently. He does also injections in his back and is thinking about getting back surgery recently (his son just had some done and he is waiting to see how that goes before he gets his done).

## 2015-01-20 NOTE — Assessment & Plan Note (Signed)
Continues to follow with oncology yearly and so far counts have been stable without treatment. He also has had ITP in the past which did not require treatment.

## 2015-01-20 NOTE — Assessment & Plan Note (Signed)
Reviewed recent lipid panel which is at goal on current simvastatin. No adverse side effects.

## 2015-01-20 NOTE — Assessment & Plan Note (Signed)
On finasteride and flomax and still having problems. Follows with urology and sounds as though they are considering TURP (per patient roto rooter).

## 2015-01-20 NOTE — Assessment & Plan Note (Signed)
Still smokes (1 pack lasts a week). Talked to him about the need to stop entirely for his heart but he does not wish to at this time.

## 2015-01-20 NOTE — Progress Notes (Signed)
   Subjective:    Patient ID: [name] [name], male    DOB: Feb 24, 1941, 74 y.o.   MRN: [id number]  HPI The patient is a 74 YO man who is coming in today for wellness. He has no new complaints although it has been several years since his last visit. He has continued to follow up with cardiology in the meantime. No new significant changes to his health. Please see A/P for status and treatment of his chronic problems.   Diet: heart healthy Physical activity: sedentary Depression/mood screen: negative Hearing: intact to whispered voice Visual acuity: grossly normal ADLs: capable Fall risk: none Home safety: good Cognitive evaluation: intact to orientation, naming, recall and repetition EOL planning: adv directives discussed, DNR/DNI  I have personally reviewed and have noted 1. The patient's medical and social history 2. Their use of alcohol, tobacco or illicit drugs 3. Their current medications and supplements 4. The patient's functional ability including ADL's, fall risks, home safety risks and hearing or visual impairment. 5. Diet and physical activities 6. Evidence for depression or mood disorders  Review of Systems  Constitutional: Negative for fever, activity change, appetite change, fatigue and unexpected weight change.  HENT: Negative.   Eyes: Negative.   Respiratory: Negative for cough, chest tightness, shortness of breath and wheezing.   Cardiovascular: Negative for chest pain, palpitations and leg swelling.  Gastrointestinal: Negative for abdominal pain, diarrhea, constipation and abdominal distention.  Endocrine: Negative.   Genitourinary: Positive for difficulty urinating.  Musculoskeletal: Positive for back pain and arthralgias. Negative for gait problem.  Skin: Negative.   Neurological: Negative.   Psychiatric/Behavioral: Negative.       Objective:   Physical Exam  Constitutional: He is oriented to person, place, and time. He appears well-developed and  well-nourished.  HENT:  Head: Normocephalic and atraumatic.  Eyes: EOM are normal.  Neck: Normal range of motion.  Cardiovascular: Normal rate and regular rhythm.   Pulmonary/Chest: Effort normal and breath sounds normal. No respiratory distress. He has no wheezes.  Abdominal: Soft. Bowel sounds are normal. He exhibits no distension. There is no tenderness.  Musculoskeletal: He exhibits no edema.  Neurological: He is alert and oriented to person, place, and time. Coordination normal.  Skin: Skin is warm and dry.   Filed Vitals:   01/19/15 1405  BP: 132/80  Pulse: 79  Temp: 98.4 F (36.9 C)  TempSrc: Oral  Resp: 16  Height: 5\' 10"  (1.778 m)  Weight: 172 lb 12.8 oz (78.382 kg)  SpO2: 97%      Assessment & Plan:  Flu shot given today

## 2015-01-20 NOTE — Assessment & Plan Note (Signed)
Next tetanus due 2020, colonoscopy every 3 years and due in 2017, given flu shot today, pneumonia 23 done. Needs prevnar and shingles.

## 2015-01-25 ENCOUNTER — Encounter: Payer: Self-pay | Admitting: Cardiology

## 2015-01-25 ENCOUNTER — Ambulatory Visit (INDEPENDENT_AMBULATORY_CARE_PROVIDER_SITE_OTHER): Payer: Medicare Other | Admitting: Cardiology

## 2015-01-25 VITALS — BP 152/88 | HR 64 | Ht 70.0 in | Wt 170.0 lb

## 2015-01-25 DIAGNOSIS — I779 Disorder of arteries and arterioles, unspecified: Secondary | ICD-10-CM

## 2015-01-25 DIAGNOSIS — E785 Hyperlipidemia, unspecified: Secondary | ICD-10-CM

## 2015-01-25 DIAGNOSIS — I739 Peripheral vascular disease, unspecified: Secondary | ICD-10-CM

## 2015-01-25 DIAGNOSIS — F17200 Nicotine dependence, unspecified, uncomplicated: Secondary | ICD-10-CM

## 2015-01-25 DIAGNOSIS — Z720 Tobacco use: Secondary | ICD-10-CM

## 2015-01-25 DIAGNOSIS — I2510 Atherosclerotic heart disease of native coronary artery without angina pectoris: Secondary | ICD-10-CM

## 2015-01-25 NOTE — Assessment & Plan Note (Signed)
The current dosing is the dose that he is most comfortable with. No change in therapy.

## 2015-01-25 NOTE — Assessment & Plan Note (Signed)
He had a follow-up Doppler in November, 2015. There has been no significant change. He has mild disease. Follow-up can be in 2 years.

## 2015-01-25 NOTE — Patient Instructions (Signed)
Your physician recommends that you continue on your current medications as directed. Please refer to the Current Medication list given to you today.  Your physician wants you to follow-up in: September 2016. You will receive a reminder letter in the mail two months in advance. If you don't receive a letter, please call our office to schedule the follow-up appointment.

## 2015-01-25 NOTE — Progress Notes (Signed)
Cardiology Office Note   Date:  01/25/2015   ID:  Lynx, Goodrich 14-Jul-1941, MRN [id number]  PCP:  Olga Millers, MD  Cardiologist:  Dola Argyle, MD   Chief Complaint  Patient presents with  . Appointment    Follow-up coronary artery disease      History of Present Illness: [name] [name] is a 74 y.o. male who presents today to follow up coronary disease. I saw him last February, 2015. He continues to smoke a few cigarettes per day. He underwent bypass 2004. Nuclear study in 2011 revealed no ischemia. His ejection fraction was 55%. He is doing well now. Unfortunately he has back pain. He is not having any significant chest pain or shortness of breath.    Past Medical History  Diagnosis Date  . Thrombocytopenia     Dr. Ralene Ok  . Hypertension   . Dyslipidemia   . Ejection fraction     EF 55%, nuclear 2009  . Hx of CABG     2004  . Back pain     10/2010  . RBBB (right bundle branch block with left anterior fascicular block)   . HTN (hypertension)   . Hx of coronary artery bypass graft 2004    5  vesseel bypass graft  . CAD (coronary artery disease)   . Back pain     November, 2013  . Carotid bruit     November, 2013  . Carotid artery disease     Doppler, November, 2013, 0-39% bilateral  . Cataract   . DDD (degenerative disc disease), lumbar   . Temporary low platelet count     Past Surgical History  Procedure Laterality Date  . Coronary artery bypass graft  2004  . Thr right  Nov '04  . Eye surgery  2013    bilateral cataract extraction w/ IOL (Dr. Celene Squibb)  . Total hip arthroplasty      rigth hip    Patient Active Problem List   Diagnosis Date Noted  . BPH (benign prostatic hypertrophy) with urinary retention 01/20/2015  . Carotid artery disease   . 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  . Routine health maintenance 11/12/2011  . RBBB (right bundle branch block with left anterior fascicular block)   .  Thrombocytopenia   . Hypertension   . Dyslipidemia   . CAD (coronary artery disease)   . Hx of CABG   . Back pain   . TOBACCO ABUSE 01/01/2010  . DYSPEPSIA 01/01/2010      Current Outpatient Prescriptions  Medication Sig Dispense Refill  . aspirin 81 MG tablet Take 81 mg by mouth daily.      . diazepam (VALIUM) 10 MG tablet Take 1 tablet (10 mg total) by mouth every 6 (six) hours as needed. 30 tablet 0  . docusate sodium (COLACE) 100 MG capsule Take 100 mg by mouth 3 (three) times daily.     Marland Kitchen esomeprazole (NEXIUM) 20 MG capsule Take 20 mg by mouth daily at 12 noon.    . finasteride (PROSCAR) 5 MG tablet Take 5 mg by mouth daily.    Marland Kitchen lisinopril (PRINIVIL,ZESTRIL) 10 MG tablet TAKE 1 BY MOUTH DAILY 90 tablet 0  . metoprolol (LOPRESSOR) 50 MG tablet TAKE 1 BY MOUTH TWICE DAILY 180 tablet 0  . Multiple Vitamins-Minerals (CENTRUM SILVER ULTRA MENS PO) Take by mouth daily.      . nitroGLYCERIN (NITROSTAT) 0.4 MG SL tablet Place 1 tablet (0.4 mg total) under the  tongue every 5 (five) minutes as needed for chest pain. 25 tablet 3  . simvastatin (ZOCOR) 40 MG tablet Take 1 tablet (40 mg total) by mouth at bedtime. 90 tablet 1  . traMADol (ULTRAM) 50 MG tablet Take 50 mg by mouth every 6 (six) hours as needed. Maximum dose= 8 tablets per day      No current facility-administered medications for this visit.    Allergies:   Penicillins    Social History:  The patient  reports that he has been smoking Cigarettes.  He has a .45 pack-year smoking history. He has never used smokeless tobacco. He reports that he does not drink alcohol or use illicit drugs.   Family History:  The patient's family history includes Arthritis in his brother; COPD in his mother; Cancer in his paternal grandfather; Heart disease in his brother; Kidney disease in his mother; Stroke in his father.    ROS:  Please see the history of present illness.    Patient denies fever, chills, headache, sweats, rash, change in  vision, change in hearing, chest pain, cough, nausea or vomiting, urinary symptoms. All other systems are reviewed and are negative.    PHYSICAL EXAM: VS:  BP 152/88 mmHg  Pulse 64  Ht 5\' 10"  (1.778 m)  Wt 170 lb (77.111 kg)  BMI 24.39 kg/m2 , patient is oriented to person time and place. Affect is normal. Head is atraumatic. Sclera and conjunctiva are normal. There is no jugulovenous distention. Lungs are clear. Respiratory effort is not labored. Cardiac exam reveals S1 and S2. The abdomen is soft. There is no peripheral edema. There are no musculoskeletal deformities. There are no skin rashes.  EKG:   EKG is done today and reviewed by me. There is old right bundle branch block. There is no change. There is sinus rhythm.   Recent Labs: 05/23/2014: ALT 20; BUN 13.3; Creatinine 0.8; Hemoglobin 13.9; Platelets 104*; Potassium 4.2; Sodium 139    Lipid Panel    Component Value Date/Time   CHOL 138 12/06/2012 1424   TRIG 138.0 12/06/2012 1424   HDL 38.00* 12/06/2012 1424   CHOLHDL 4 12/06/2012 1424   VLDL 27.6 12/06/2012 1424   LDLCALC 72 12/06/2012 1424      Wt Readings from Last 3 Encounters:  01/25/15 170 lb (77.111 kg)  01/19/15 172 lb 12.8 oz (78.382 kg)  05/23/14 172 lb 14.4 oz (78.427 kg)      Current medicines are reviewed. The patient understands his medications.       ASSESSMENT AND PLAN:

## 2015-01-25 NOTE — Assessment & Plan Note (Signed)
Coronary disease is stable.  No further workup is needed. 

## 2015-01-25 NOTE — Addendum Note (Signed)
Addended by: Gust Brooms A on: 01/25/2015 08:57 AM   Modules accepted: Orders

## 2015-01-25 NOTE — Assessment & Plan Note (Signed)
I have counseled him to stop smoking.

## 2015-03-12 ENCOUNTER — Other Ambulatory Visit: Payer: Self-pay | Admitting: Cardiology

## 2015-03-12 ENCOUNTER — Other Ambulatory Visit: Payer: Self-pay

## 2015-03-12 ENCOUNTER — Other Ambulatory Visit: Payer: Self-pay | Admitting: *Deleted

## 2015-03-12 MED ORDER — SIMVASTATIN 40 MG PO TABS: 40.0000 mg | ORAL_TABLET | Freq: Every day | ORAL | Status: DC

## 2015-03-12 MED ORDER — SIMVASTATIN 40 MG PO TABS
40.0000 mg | ORAL_TABLET | Freq: Every day | ORAL | Status: DC
Start: 2015-03-12 — End: 2015-03-12

## 2015-03-23 ENCOUNTER — Telehealth: Payer: Self-pay | Admitting: Internal Medicine

## 2015-03-23 NOTE — Telephone Encounter (Signed)
Called patient and he is aware of his new appointment with dr Julien Nordmann

## 2015-03-30 NOTE — Op Note (Signed)
PATIENT NAME:  [name] [name], PRUSS MR#:  [id number] DATE OF BIRTH:  10/20/1941  DATE OF PROCEDURE:  08/03/2013  PROCEDURES PERFORMED: 1.  Pars plana vitrectomy of the right eye.  2.  Internal limiting membrane peel of the right eye.  3.  Gas exchange of the right eye.   PREOPERATIVE DIAGNOSES: Macular hole.   POSTOPERATIVE DIAGNOSIS:  Macular hole.   ESTIMATED BLOOD LOSS: Less than 1 mL.  PRIMARY SURGEON:  Garlan Fair, M.D.   ANESTHESIA: Retrobulbar block of the right eye with monitored anesthesia care.   COMPLICATIONS: None.   INDICATIONS FOR PROCEDURE: The patient presented to my office with decreased vision in his right eye. Examination revealed a macular hole with resultant vision of 2100. Risks, benefits, and alternatives of the above procedure were discussed and the patient wished to proceed.   DETAILS OF PROCEDURE: After informed consent was obtained, the patient was brought into the operative suite at [facility]. The patient was placed in supine position and was given a small dose of Alfenta and a retrobulbar block was performed on the right eye by the primary surgeon without any complications. The right eye was prepped and draped in sterile manner. After lid speculum was inserted, a 25-gauge trocar was placed inferotemporally through displaced conjunctiva 3 mm beyond the limbus in oblique fashion. The infusion cannula was turned on and inserted through the trocar and secured in position with Steri-Strips. Two more trocars were placed in a similar fashion superotemporally and superonasally. Vitreous cutter and light pipe were introduced in the eye and a core vitrectomy was performed. The vitreous face was engaged using suction and peeled away from the retina without any complication. The vitreous face was then trimmed and the peripheral vitreous was trimmed for 360 degrees. Indocyanine green was injected onto the posterior pole and removed within 30 seconds.  An internal limiting membrane peel was then performed for 360 degrees around the macular hole for a total diameter of 2 disk diameters. A scleral depressed exam was performed for 360 degrees. Some areas of lattice degeneration were identified and after extensive scleral depressed exam and visualization, no signs of any breaks, tears, or retinal detachment could be identified. Scleral depressed exam was performed twice. Once completed, a complete air-fluid exchange was performed. Four minutes was allowed to elapse for dehydration of the vitreous base. This remnant fluid was removed. 20% SF6 was used as an air gas exchange and the trocars were removed. The wounds were noted to be airtight and pressure in the eye was confirmed to be approximately 15 mmHg.  Dexamethasone 5 mg was given into the inferior fornix and the lid speculum was removed. The eye was cleaned and TobraDex was placed in the eye. A patch and shield were placed over the eye and the patient was taken to postanesthesia care with instructions to remain face down.     ____________________________ Teresa Pelton. Starling Manns, MD mfa:dp D: 08/03/2013 08:10:00 ET T: 08/03/2013 08:58:53 ET JOB#: [id number] Karagan Lehr F Coryn Mosso MD ELECTRONICALLY SIGNED 08/31/2013 7:17

## 2015-04-18 ENCOUNTER — Other Ambulatory Visit: Payer: Self-pay

## 2015-04-18 MED ORDER — METOPROLOL TARTRATE 50 MG PO TABS: 50.0000 mg | ORAL_TABLET | Freq: Two times a day (BID) | ORAL | Status: DC

## 2015-04-18 MED ORDER — LISINOPRIL 10 MG PO TABS: 10.0000 mg | ORAL_TABLET | Freq: Every day | ORAL | Status: DC

## 2015-04-18 NOTE — Telephone Encounter (Signed)
[name] Bjornstad, MD at 01/25/2015 8:34 AM  lisinopril (PRINIVIL,ZESTRIL) 10 MG tablet TAKE 1 BY MOUTH DAILY metoprolol (LOPRESSOR) 50 MG tablet TAKE 1 BY MOUTH TWICE DAILY Patient Instructions     Your physician recommends that you continue on your current medications as directed. Please refer to the Current Medication list given to you today.

## 2015-05-24 ENCOUNTER — Other Ambulatory Visit: Payer: Medicare Other

## 2015-05-24 ENCOUNTER — Ambulatory Visit: Payer: Medicare Other

## 2015-05-24 ENCOUNTER — Other Ambulatory Visit: Payer: Self-pay | Admitting: Medical Oncology

## 2015-05-24 DIAGNOSIS — D472 Monoclonal gammopathy: Secondary | ICD-10-CM

## 2015-05-28 ENCOUNTER — Encounter: Payer: Self-pay | Admitting: Internal Medicine

## 2015-05-28 ENCOUNTER — Telehealth: Payer: Self-pay | Admitting: Internal Medicine

## 2015-05-28 ENCOUNTER — Other Ambulatory Visit (HOSPITAL_BASED_OUTPATIENT_CLINIC_OR_DEPARTMENT_OTHER): Payer: Medicare Other

## 2015-05-28 ENCOUNTER — Ambulatory Visit (HOSPITAL_BASED_OUTPATIENT_CLINIC_OR_DEPARTMENT_OTHER): Payer: Medicare Other | Admitting: Internal Medicine

## 2015-05-28 VITALS — BP 155/79 | HR 69 | Temp 97.7°F | Resp 18 | Ht 70.0 in | Wt 168.9 lb

## 2015-05-28 DIAGNOSIS — D696 Thrombocytopenia, unspecified: Secondary | ICD-10-CM

## 2015-05-28 DIAGNOSIS — D472 Monoclonal gammopathy: Secondary | ICD-10-CM

## 2015-05-28 LAB — CBC WITH DIFFERENTIAL/PLATELET
BASO%: 0.3 % (ref 0.0–2.0)
BASOS ABS: 0 10*3/uL (ref 0.0–0.1)
EOS ABS: 0.8 10*3/uL — AB (ref 0.0–0.5)
EOS%: 9.6 % — ABNORMAL HIGH (ref 0.0–7.0)
HCT: 42.4 % (ref 38.4–49.9)
HGB: 14.4 g/dL (ref 13.0–17.1)
LYMPH%: 26.9 % (ref 14.0–49.0)
MCH: 31 pg (ref 27.2–33.4)
MCHC: 34 g/dL (ref 32.0–36.0)
MCV: 91.4 fL (ref 79.3–98.0)
MONO#: 0.6 10*3/uL (ref 0.1–0.9)
MONO%: 6.9 % (ref 0.0–14.0)
NEUT%: 56.3 % (ref 39.0–75.0)
NEUTROS ABS: 4.9 10*3/uL (ref 1.5–6.5)
Platelets: 107 10*3/uL — ABNORMAL LOW (ref 140–400)
RBC: 4.64 10*6/uL (ref 4.20–5.82)
RDW: 13.4 % (ref 11.0–14.6)
WBC: 8.7 10*3/uL (ref 4.0–10.3)
lymph#: 2.4 10*3/uL (ref 0.9–3.3)

## 2015-05-28 LAB — COMPREHENSIVE METABOLIC PANEL (CC13)
ALBUMIN: 4 g/dL (ref 3.5–5.0)
ALK PHOS: 88 U/L (ref 40–150)
ALT: 22 U/L (ref 0–55)
AST: 19 U/L (ref 5–34)
Anion Gap: 7 mEq/L (ref 3–11)
BUN: 16.8 mg/dL (ref 7.0–26.0)
CALCIUM: 9.7 mg/dL (ref 8.4–10.4)
CO2: 28 meq/L (ref 22–29)
Chloride: 106 mEq/L (ref 98–109)
Creatinine: 0.8 mg/dL (ref 0.7–1.3)
EGFR: 87 mL/min/{1.73_m2} — AB (ref >90)
GLUCOSE: 99 mg/dL (ref 70–140)
Potassium: 4.5 mEq/L (ref 3.5–5.1)
Sodium: 141 mEq/L (ref 136–145)
TOTAL PROTEIN: 7 g/dL (ref 6.4–8.3)
Total Bilirubin: 0.44 mg/dL (ref 0.20–1.20)

## 2015-05-28 LAB — LACTATE DEHYDROGENASE (CC13): LDH: 134 U/L (ref 125–245)

## 2015-05-28 NOTE — Progress Notes (Signed)
[place] [phone]   [phone]  OFFICE PROGRESS NOTE  Olga Millers, MD [place] [address]  DIAGNOSIS:  1) monoclonal history of undetermined significance. 2) thrombocytopenia  PRIOR THERAPY: None  CURRENT THERAPY: Observation  INTERVAL HISTORY: [name] [name] 74 y.o. male returns to the clinic today for follow-up visit and to establish care with me. He was seen in the past by Dr. Ralene Ok and Dr. Juliann Mule. He has a diagnosis of monoclonal gammopathy and thrombocytopenia for several years. Has been observation with no active treatment. He is feeling fine today with no specific complaints. The patient denied having any significant weight loss or night sweats. He has no chest pain, shortness of breath, cough or hemoptysis. He has no nausea or vomiting, no fever or chills. He had repeat myeloma panel performed earlier today and is here for evaluation and discussion of his lab results.  MEDICAL HISTORY: Past Medical History  Diagnosis Date  . Thrombocytopenia     Dr. Ralene Ok  . Hypertension   . Dyslipidemia   . Ejection fraction     EF 55%, nuclear 2009  . Hx of CABG     2004  . Back pain     10/2010  . RBBB (right bundle branch block with left anterior fascicular block)   . HTN (hypertension)   . Hx of coronary artery bypass graft 2004    5  vesseel bypass graft  . CAD (coronary artery disease)   . Back pain     November, 2013  . Carotid bruit     November, 2013  . Carotid artery disease     Doppler, November, 2013, 0-39% bilateral  . Cataract   . DDD (degenerative disc disease), lumbar   . Temporary low platelet count     ALLERGIES:  is allergic to penicillins.  MEDICATIONS:  Current Outpatient Prescriptions  Medication Sig Dispense Refill  . aspirin 81 MG tablet Take 81 mg by mouth daily.      . diazepam (VALIUM) 10 MG tablet Take 1 tablet (10 mg total) by mouth every 6 (six) hours as  needed. 30 tablet 0  . docusate sodium (COLACE) 100 MG capsule Take 100 mg by mouth 3 (three) times daily.     Marland Kitchen esomeprazole (NEXIUM) 20 MG capsule Take 20 mg by mouth daily at 12 noon.    . finasteride (PROSCAR) 5 MG tablet Take 5 mg by mouth daily.    Marland Kitchen lisinopril (PRINIVIL,ZESTRIL) 10 MG tablet Take 1 tablet (10 mg total) by mouth daily. 90 tablet 2  . metoprolol (LOPRESSOR) 50 MG tablet Take 1 tablet (50 mg total) by mouth 2 (two) times daily. 180 tablet 2  . Multiple Vitamins-Minerals (CENTRUM SILVER ULTRA MENS PO) Take by mouth daily.      . simvastatin (ZOCOR) 40 MG tablet Take 1 tablet (40 mg total) by mouth at bedtime. 90 tablet 3  . tamsulosin (FLOMAX) 0.4 MG CAPS capsule   2  . traMADol (ULTRAM) 50 MG tablet Take 50 mg by mouth every 6 (six) hours as needed. Maximum dose= 8 tablets per day     . nitroGLYCERIN (NITROSTAT) 0.4 MG SL tablet Place 1 tablet (0.4 mg total) under the tongue every 5 (five) minutes as needed for chest pain. (Patient not taking: Reported on 05/28/2015) 25 tablet 3   No current facility-administered medications for this visit.    SURGICAL HISTORY:  Past Surgical History  Procedure  Laterality Date  . Coronary artery bypass graft  2004  . Thr right  Nov '04  . Eye surgery  2013    bilateral cataract extraction w/ IOL (Dr. Celene Squibb)  . Total hip arthroplasty      rigth hip    REVIEW OF SYSTEMS:  A comprehensive review of systems was negative.   PHYSICAL EXAMINATION: General appearance: alert, cooperative and no distress Head: Normocephalic, without obvious abnormality, atraumatic Neck: no adenopathy, no JVD, supple, symmetrical, trachea midline and thyroid not enlarged, symmetric, no tenderness/mass/nodules Lymph nodes: Cervical, supraclavicular, and axillary nodes normal. Resp: clear to auscultation bilaterally Back: symmetric, no curvature. ROM normal. No CVA tenderness. Cardio: regular rate and rhythm, S1, S2 normal, no murmur, click, rub or  gallop GI: soft, non-tender; bowel sounds normal; no masses,  no organomegaly Extremities: extremities normal, atraumatic, no cyanosis or edema  ECOG PERFORMANCE STATUS: 0 - Asymptomatic  Blood pressure 155/79, pulse 69, temperature 97.7 F (36.5 C), temperature source Oral, resp. rate 18, height 5\' 10"  (1.778 m), weight 168 lb 14.4 oz (76.613 kg), SpO2 99 %.  LABORATORY DATA: Lab Results  Component Value Date   WBC 8.7 05/28/2015   HGB 14.4 05/28/2015   HCT 42.4 05/28/2015   MCV 91.4 05/28/2015   PLT 107* 05/28/2015      Chemistry      Component Value Date/Time   NA 141 05/28/2015 0956   NA 139 01/16/2012 1322   K 4.5 05/28/2015 0956   K 4.1 01/16/2012 1322   CL 108* 05/23/2013 1514   CL 105 01/16/2012 1322   CO2 28 05/28/2015 0956   CO2 26 01/16/2012 1322   BUN 16.8 05/28/2015 0956   BUN 13 01/16/2012 1322   CREATININE 0.8 05/28/2015 0956   CREATININE 1.04 01/16/2012 1322      Component Value Date/Time   CALCIUM 9.7 05/28/2015 0956   CALCIUM 9.3 01/16/2012 1322   ALKPHOS 88 05/28/2015 0956   ALKPHOS 71 12/06/2012 1424   AST 19 05/28/2015 0956   AST 21 12/06/2012 1424   ALT 22 05/28/2015 0956   ALT 19 12/06/2012 1424   BILITOT 0.44 05/28/2015 0956   BILITOT 0.6 12/06/2012 1424       RADIOGRAPHIC STUDIES: No results found.  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 The patient has been observation for many years with no significant progression of his disease. His myeloma panel is still pending for today. I will call the patient was recommendation of that is any significant abnormalities. I recommended for the patient to continue on observation with repeat myeloma panel in one year. He was advised to call immediately if he has any concerning symptoms in the interval. The patient voices understanding of current  disease status and treatment options and is in agreement with the current care plan.  All questions were answered. The patient knows to call the clinic with any problems, questions or concerns. We can certainly see the patient much sooner if necessary.  Disclaimer: This note was dictated with voice recognition software. Similar sounding words can inadvertently be transcribed and may not be corrected upon review.

## 2015-05-28 NOTE — Telephone Encounter (Signed)
Gave avs & calendar for June 2017 °

## 2015-05-30 LAB — SPEP & IFE WITH QIG
ABNORMAL PROTEIN BAND1: 0.3 g/dL
ALPHA-1-GLOBULIN: 0.4 g/dL — AB (ref 0.2–0.3)
ALPHA-2-GLOBULIN: 0.8 g/dL (ref 0.5–0.9)
Albumin ELP: 3.9 g/dL (ref 3.8–4.8)
BETA GLOBULIN: 0.4 g/dL (ref 0.4–0.6)
Beta 2: 0.8 g/dL — ABNORMAL HIGH (ref 0.2–0.5)
GAMMA GLOBULIN: 0.5 g/dL — AB (ref 0.8–1.7)
IGG (IMMUNOGLOBIN G), SERUM: 1080 mg/dL (ref 650–1600)
IgA: 66 mg/dL — ABNORMAL LOW (ref 68–379)
IgM, Serum: 40 mg/dL — ABNORMAL LOW (ref 41–251)
Total Protein, Serum Electrophoresis: 6.7 g/dL (ref 6.1–8.1)

## 2015-05-30 LAB — BETA 2 MICROGLOBULIN, SERUM: BETA 2 MICROGLOBULIN: 1.89 mg/L (ref <=2.51)

## 2015-07-20 ENCOUNTER — Ambulatory Visit: Payer: Medicare Other | Admitting: Internal Medicine

## 2015-07-24 ENCOUNTER — Ambulatory Visit (INDEPENDENT_AMBULATORY_CARE_PROVIDER_SITE_OTHER): Payer: Medicare Other | Admitting: Urology

## 2015-07-24 DIAGNOSIS — N401 Enlarged prostate with lower urinary tract symptoms: Secondary | ICD-10-CM | POA: Diagnosis not present

## 2015-07-31 ENCOUNTER — Encounter: Payer: Self-pay | Admitting: Internal Medicine

## 2015-07-31 ENCOUNTER — Ambulatory Visit (INDEPENDENT_AMBULATORY_CARE_PROVIDER_SITE_OTHER): Payer: Medicare Other | Admitting: Internal Medicine

## 2015-07-31 VITALS — BP 140/90 | HR 69 | Temp 97.8°F | Resp 14 | Ht 70.0 in | Wt 168.8 lb

## 2015-07-31 DIAGNOSIS — Z23 Encounter for immunization: Secondary | ICD-10-CM

## 2015-07-31 DIAGNOSIS — H6091 Unspecified otitis externa, right ear: Secondary | ICD-10-CM

## 2015-07-31 MED ORDER — DIAZEPAM 10 MG PO TABS: 10.0000 mg | ORAL_TABLET | Freq: Four times a day (QID) | ORAL | Status: DC | PRN

## 2015-07-31 MED ORDER — NEOMYCIN-POLYMYXIN-HC 1 % OT SOLN: 3.0000 [drp] | Freq: Three times a day (TID) | OTIC | Status: DC

## 2015-07-31 MED ORDER — FLUTICASONE PROPIONATE 50 MCG/ACT NA SUSP: 2.0000 | Freq: Every day | NASAL | Status: DC

## 2015-07-31 NOTE — Addendum Note (Signed)
Addended by: Resa Miner R on: 07/31/2015 11:03 AM   Modules accepted: Orders

## 2015-07-31 NOTE — Progress Notes (Signed)
   Subjective:    Patient ID: [name] [name], male    DOB: 04-12-1941, 74 y.o.   MRN: [id number]  HPI The patient is a 74 YO man who is coming in for sinus congestion and right ear pain. It started about 2 weeks ago when his sinuses got congested. He is having pain when he tugs on his ear. No drainage from the ear and no decrease in hearing. No tinnitus. No fevers or chills. Some mild headaches from the sinuses.   Review of Systems  Constitutional: Negative for fever, activity change, appetite change, fatigue and unexpected weight change.  HENT: Positive for congestion, ear pain, postnasal drip and rhinorrhea. Negative for ear discharge, hearing loss, sinus pressure, sneezing, sore throat and tinnitus.   Eyes: Negative.   Respiratory: Negative for cough, chest tightness, shortness of breath and wheezing.   Cardiovascular: Negative for chest pain, palpitations and leg swelling.  Gastrointestinal: Negative for abdominal pain, diarrhea, constipation and abdominal distention.      Objective:   Physical Exam  Constitutional: He is oriented to person, place, and time. He appears well-developed and well-nourished.  HENT:  Head: Normocephalic and atraumatic.  Right ear with pain with tugging and signs of otitis externa, bilateral TMs normal.   Eyes: EOM are normal.  Neck: Normal range of motion.  Cardiovascular: Normal rate and regular rhythm.   Pulmonary/Chest: Effort normal and breath sounds normal. No respiratory distress. He has no wheezes.  Abdominal: Soft. Bowel sounds are normal. He exhibits no distension. There is no tenderness.  Musculoskeletal: He exhibits no edema.  Neurological: He is alert and oriented to person, place, and time. Coordination normal.  Skin: Skin is warm and dry.   Filed Vitals:   07/31/15 0843  BP: 140/90  Pulse: 69  Temp: 97.8 F (36.6 C)  TempSrc: Oral  Resp: 14  Height: 5\' 10"  (1.778 m)  Weight: 168 lb 12.8 oz (76.567 kg)  SpO2: 98%        Assessment & Plan:

## 2015-07-31 NOTE — Progress Notes (Signed)
Pre visit review using our clinic review tool, if applicable. No additional management support is needed unless otherwise documented below in the visit note. 

## 2015-07-31 NOTE — Assessment & Plan Note (Signed)
Rx for corticosporin otic drops for his ear as well as flonase for his sinus congestion. No indication for oral antibiotic today.

## 2015-07-31 NOTE — Patient Instructions (Addendum)
We have sent in the ear drops to be refilled. Use the drops 3 times per day for the next week and then when needed if this comes back.  I have also sent in the nose spray for the sinuses which is called fluticasone. Use 2 sprays in each nostril once a day for the sinuses. It may take 3-4 days to kick in before you notice the benefit.   We have given you the pneumonia booster shot called prevnar today.

## 2015-11-27 ENCOUNTER — Other Ambulatory Visit: Payer: Self-pay | Admitting: Cardiology

## 2016-01-10 NOTE — Progress Notes (Signed)
Cardiology Office Note   Date:  01/11/2016   ID:  [name] [name], DOB 08-06-1941, MRN [id number]  PCP:  Hoyt Koch, MD  Cardiologist:  Dr. Sandria Manly follow up    History of Present Illness: [name] [name] is a 75 y.o. male with a history of CAD s/p CABGx5V (2004), tobacco abuse, thrombocytopenia/MGUS, HTN, HLD, RBBB/LPFB, and carotid artery disease who presents to clinic for yearly follow up.   He underwent bypass 2004. Nuclear study in 2011 revealed no ischemia. His ejection fraction was 55%.  He was last seen by Dr Ron Parker in 01/2015. He was still smoking a couple cigarettes a day. No chest pain/SOB.  Today he presents to clinic for follow up. Still smoking a pack a week. Having back problems. He is going to probably need surgery. No CP or SOB. No LE edema, orthopnea, or PND. No dizziness or syncope. No blood in his stool or urine.     Past Medical History  Diagnosis Date  . Thrombocytopenia ([place])     Dr. Ralene Ok  . Hypertension   . Dyslipidemia   . Ejection fraction     EF 55%, nuclear 2009  . Hx of CABG     2004  . Back pain     10/2010  . RBBB (right bundle branch block with left anterior fascicular block)   . HTN (hypertension)   . Hx of coronary artery bypass graft 2004    5  vesseel bypass graft  . CAD (coronary artery disease)   . Back pain     November, 2013  . Carotid bruit     November, 2013  . Carotid artery disease ([place])     Doppler, November, 2013, 0-39% bilateral  . Cataract   . DDD (degenerative disc disease), lumbar   . Temporary low platelet count ([facility])     Past Surgical History  Procedure Laterality Date  . Coronary artery bypass graft  2004  . Thr right  Nov '04  . Eye surgery  2013    bilateral cataract extraction w/ IOL (Dr. Celene Squibb)  . Total hip arthroplasty      rigth hip     Current Outpatient Prescriptions  Medication Sig Dispense Refill  . aspirin 81 MG tablet Take 81 mg by mouth daily.      . diazepam  (VALIUM) 10 MG tablet Take 1 tablet (10 mg total) by mouth every 6 (six) hours as needed. 30 tablet 0  . docusate sodium (COLACE) 100 MG capsule Take 100 mg by mouth 3 (three) times daily.     Marland Kitchen esomeprazole (NEXIUM) 20 MG capsule Take 20 mg by mouth daily at 12 noon.    . finasteride (PROSCAR) 5 MG tablet Take 5 mg by mouth daily.    . fluticasone (FLONASE) 50 MCG/ACT nasal spray Place 2 sprays into both nostrils daily. 16 g 6  . lisinopril (PRINIVIL,ZESTRIL) 10 MG tablet TAKE 1 (10 MG TOTAL) BY MOUTH DAILY 90 tablet 0  . metoprolol (LOPRESSOR) 50 MG tablet Take 1 tablet (50 mg total) by mouth 2 (two) times daily. 180 tablet 2  . Multiple Vitamins-Minerals (CENTRUM SILVER ULTRA MENS PO) Take by mouth daily.      [name] OT Place 3 drops into both eyes 3 (three) times daily as needed (FOR EAR INFECTION).    Marland Kitchen nitroGLYCERIN (NITROSTAT) 0.4 MG SL tablet Place 1 tablet (0.4 mg total) under the tongue every 5 (five) minutes as needed for  chest pain. 25 tablet 3  . simvastatin (ZOCOR) 40 MG tablet Take 1 tablet (40 mg total) by mouth at bedtime. 90 tablet 3  . tamsulosin (FLOMAX) 0.4 MG CAPS capsule Take 0.4 mg by mouth daily.   2  . traMADol (ULTRAM) 50 MG tablet Take 50 mg by mouth every 6 (six) hours as needed for severe pain. Maximum dose= 8 tablets per day     No current facility-administered medications for this visit.    Allergies:   Penicillins    Social History:  The patient  reports that he has been smoking Cigarettes.  He has a 22.5 pack-year smoking history. He has never used smokeless tobacco. He reports that he does not drink alcohol or use illicit drugs.   Family History:  The patient's family history includes Arthritis in his brother; COPD in his mother; Cancer in his paternal grandfather; Heart disease in his brother; Kidney disease in his mother; Stroke in his father.    ROS:  Please see the history of present illness.   Otherwise, review of systems are positive  for none.   All other systems are reviewed and negative.    PHYSICAL EXAM: VS:  BP 122/60 mmHg  Pulse 68  Ht 5\' 10"  (1.778 m)  Wt 170 lb 12.8 oz (77.474 kg)  BMI 24.51 kg/m2 , BMI Body mass index is 24.51 kg/(m^2). GEN: Well nourished, well developed, in no acute distress HEENT: normal Neck: no JVD, carotid bruits, or masses Cardiac: RRR; no murmurs, rubs, or gallops,no edema  Respiratory:  clear to auscultation bilaterally, normal work of breathing GI: soft, nontender, nondistended, + BS MS: no deformity or atrophy Skin: warm and dry, no rash Neuro:  Strength and sensation are intact Psych: euthymic mood, full affect   EKG:  EKG is ordered today. 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    Recent Labs: 05/28/2015: ALT 22; BUN 16.8; Creatinine 0.8; HGB 14.4; Platelets 107*; Potassium 4.5; Sodium 141    Lipid Panel    Component Value Date/Time   CHOL 138 12/06/2012 1424   TRIG 138.0 12/06/2012 1424   HDL 38.00* 12/06/2012 1424   CHOLHDL 4 12/06/2012 1424   VLDL 27.6 12/06/2012 1424   LDLCALC 72 12/06/2012 1424      Wt Readings from Last 3 Encounters:  01/11/16 170 lb 12.8 oz (77.474 kg)  07/31/15 168 lb 12.8 oz (76.567 kg)  05/28/15 168 lb 14.4 oz (76.613 kg)      Other studies Reviewed: Additional studies/ records that were reviewed today include: 2D ECHO, MYoview Review of the above records demonstrates: see HPI.    ASSESSMENT AND PLAN:  LEGEND KOLODZIEJ is a 75 y.o. male with a history of CAD s/p CABGx5V (2004), tobacco abuse, thrombocytopenia/MGUS, HTN, HLD, RBBB, and carotid artery disease who presents to clinic for yearly follow up.   CAD: continue ASA, statin and BB. No ischemic symptoms   HTN: BP well controlled. Continue lisinopril 10mg  and lopressor 50mg  BID  HLD: continue simvastatin 40mg  daily.   Carotid artery disease: he is due for repeat dopplers in 06/2016.   Tobacco abuse: counseled on  cessation.   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 Followed cloesly by heme/onc  Current medicines are reviewed at length with the patient today.  The patient has concerns regarding medicines.  The following changes have been made:  no change  Labs/ tests ordered today include:  No orders of the defined types were  placed in this encounter.    Disposition:   FU with Meda Coffee in 1 year  Signed, Crista Luria  01/11/2016 12:27 PM    [facility] [place], [place], [place]  [address] Phone: [phone]; Fax: [phone]

## 2016-01-11 ENCOUNTER — Encounter: Payer: Self-pay | Admitting: Physician Assistant

## 2016-01-11 ENCOUNTER — Ambulatory Visit (INDEPENDENT_AMBULATORY_CARE_PROVIDER_SITE_OTHER): Payer: PPO | Admitting: Physician Assistant

## 2016-01-11 ENCOUNTER — Other Ambulatory Visit: Payer: Self-pay | Admitting: Physician Assistant

## 2016-01-11 VITALS — BP 122/60 | HR 68 | Ht 70.0 in | Wt 170.8 lb

## 2016-01-11 DIAGNOSIS — I2510 Atherosclerotic heart disease of native coronary artery without angina pectoris: Secondary | ICD-10-CM

## 2016-01-11 DIAGNOSIS — I10 Essential (primary) hypertension: Secondary | ICD-10-CM | POA: Diagnosis not present

## 2016-01-11 NOTE — Patient Instructions (Signed)
Medication Instructions:  Your physician recommends that you continue on your current medications as directed. Please refer to the Current Medication list given to you today.   Labwork: None ordered  Testing/Procedures: None ordered  Follow-Up: Your physician wants you to follow-up in: [place].  You will receive a reminder letter in the mail two months in advance. If you don't receive a letter, please call our office to schedule the follow-up appointment.   Any Other Special Instructions Will Be Listed Below (If Applicable).     If you need a refill on your cardiac medications before your next appointment, please call your pharmacy.

## 2016-01-14 ENCOUNTER — Other Ambulatory Visit: Payer: Self-pay | Admitting: *Deleted

## 2016-01-14 MED ORDER — METOPROLOL TARTRATE 50 MG PO TABS: 50.0000 mg | ORAL_TABLET | Freq: Two times a day (BID) | ORAL | Status: DC

## 2016-01-14 MED ORDER — LISINOPRIL 10 MG PO TABS: ORAL_TABLET | ORAL | Status: DC

## 2016-01-14 MED ORDER — SIMVASTATIN 40 MG PO TABS: 40.0000 mg | ORAL_TABLET | Freq: Every day | ORAL | Status: DC

## 2016-01-14 NOTE — Telephone Encounter (Signed)
Will route this request to Dr Meda Coffee for further review and recommendation on Rx request.

## 2016-01-14 NOTE — Telephone Encounter (Signed)
Previous patient of Dr Ron Parker, saw Angelena Form 01/11/16, will follow up with Dr Meda Coffee in one year. He is requesting a refill on simvastatin but there is not a recent lipid panel in epic, per office visit this was last done in 2013. Please advise. Thanks, MI

## 2016-01-31 ENCOUNTER — Encounter: Payer: Self-pay | Admitting: Internal Medicine

## 2016-01-31 ENCOUNTER — Ambulatory Visit (INDEPENDENT_AMBULATORY_CARE_PROVIDER_SITE_OTHER): Payer: PPO | Admitting: Internal Medicine

## 2016-01-31 ENCOUNTER — Other Ambulatory Visit (INDEPENDENT_AMBULATORY_CARE_PROVIDER_SITE_OTHER): Payer: PPO

## 2016-01-31 VITALS — BP 138/88 | HR 73 | Temp 98.1°F | Resp 14 | Ht 70.0 in | Wt 170.0 lb

## 2016-01-31 DIAGNOSIS — I10 Essential (primary) hypertension: Secondary | ICD-10-CM

## 2016-01-31 DIAGNOSIS — Z23 Encounter for immunization: Secondary | ICD-10-CM

## 2016-01-31 DIAGNOSIS — Z0000 Encounter for general adult medical examination without abnormal findings: Secondary | ICD-10-CM

## 2016-01-31 DIAGNOSIS — E785 Hyperlipidemia, unspecified: Secondary | ICD-10-CM | POA: Diagnosis not present

## 2016-01-31 DIAGNOSIS — F17200 Nicotine dependence, unspecified, uncomplicated: Secondary | ICD-10-CM | POA: Diagnosis not present

## 2016-01-31 LAB — LIPID PANEL
CHOL/HDL RATIO: 3
CHOLESTEROL: 142 mg/dL (ref 0–200)
HDL: 48.5 mg/dL (ref >39.00)
LDL CALC: 78 mg/dL (ref 0–99)
NonHDL: 93.37
Triglycerides: 77 mg/dL (ref 0.0–149.0)
VLDL: 15.4 mg/dL (ref 0.0–40.0)

## 2016-01-31 MED ORDER — NEOMYCIN-POLYMYXIN-HC 1 % OT SOLN: 3.0000 [drp] | Freq: Three times a day (TID) | OTIC | Status: DC

## 2016-01-31 NOTE — Progress Notes (Signed)
Pre visit review using our clinic review tool, if applicable. No additional management support is needed unless otherwise documented below in the visit note. 

## 2016-01-31 NOTE — Progress Notes (Signed)
   Subjective:    Patient ID: [name] [name], male    DOB: 1941/04/20, 75 y.o.   MRN: [id number]  HPI Here for medicare wellness, no new complaints. Please see A/P for status and treatment of chronic medical problems.   Diet: heart healthy Physical activity: active Depression/mood screen: negative Hearing: mild to moderate loss Visual acuity: grossly normal with lens, performs annual eye exam  ADLs: capable Fall risk: low Home safety: good Cognitive evaluation: intact to orientation, naming, recall and repetition EOL planning: adv directives discussed, in place  I have personally reviewed and have noted 1. The patient's medical and social history - reviewed today no changes 2. Their use of alcohol, tobacco or illicit drugs 3. Their current medications and supplements 4. The patient's functional ability including ADL's, fall risks, home safety risks and hearing or visual impairment. 5. Diet and physical activities 6. Evidence for depression or mood disorders 7. Care team reviewed and updated (available in snapshot)  Review of Systems  Constitutional: Negative for fever, activity change, appetite change, fatigue and unexpected weight change.  HENT: Negative.   Eyes: Negative.   Respiratory: Negative for cough, chest tightness, shortness of breath and wheezing.   Cardiovascular: Negative for chest pain, palpitations and leg swelling.  Gastrointestinal: Negative for abdominal pain, diarrhea, constipation and abdominal distention.  Musculoskeletal: Positive for back pain and arthralgias. Negative for gait problem.  Skin: Negative.   Neurological: Negative.   Psychiatric/Behavioral: Negative.       Objective:   Physical Exam  Constitutional: He is oriented to person, place, and time. He appears well-developed and well-nourished.  HENT:  Head: Normocephalic and atraumatic.  Eyes: EOM are normal.  Neck: Normal range of motion.  Cardiovascular: Normal rate and regular rhythm.     Pulmonary/Chest: Effort normal and breath sounds normal. No respiratory distress. He has no wheezes.  Abdominal: Soft. Bowel sounds are normal. He exhibits no distension. There is no tenderness.  Musculoskeletal: He exhibits no edema.  Neurological: He is alert and oriented to person, place, and time. Coordination normal.  Skin: Skin is warm and dry.   Filed Vitals:   01/31/16 0801  BP: 138/88  Pulse: 73  Temp: 98.1 F (36.7 C)  TempSrc: Oral  Resp: 14  Height: 5\' 10"  (1.778 m)  Weight: 170 lb (77.111 kg)  SpO2: 97%      Assessment & Plan:  Flu shot given at visit.

## 2016-01-31 NOTE — Patient Instructions (Signed)
We have sent in the ear drops to your mail order pharmacy.   We will check the cholesterol today.   Come back in about 1 year if you are doing well or please feel free to call us or come back sooner if you have any problems.   Health Maintenance, Male A healthy lifestyle and preventative care can promote health and wellness.  Maintain regular health, dental, and eye exams.  Eat a healthy diet. Foods like vegetables, fruits, whole grains, low-fat dairy products, and lean protein foods contain the nutrients you need and are low in calories. Decrease your intake of foods high in solid fats, added sugars, and salt. Get information about a proper diet from your health care provider, if necessary.  Regular physical exercise is one of the most important things you can do for your health. Most adults should get at least 150 minutes of moderate-intensity exercise (any activity that increases your heart rate and causes you to sweat) each week. In addition, most adults need muscle-strengthening exercises on 2 or more days a week.   Maintain a healthy weight. The body mass index (BMI) is a screening tool to identify possible weight problems. It provides an estimate of body fat based on height and weight. Your health care provider can find your BMI and can help you achieve or maintain a healthy weight. For males 20 years and older:  A BMI below 18.5 is considered underweight.  A BMI of 18.5 to 24.9 is normal.  A BMI of 25 to 29.9 is considered overweight.  A BMI of 30 and above is considered obese.  Maintain normal blood lipids and cholesterol by exercising and minimizing your intake of saturated fat. Eat a balanced diet with plenty of fruits and vegetables. Blood tests for lipids and cholesterol should begin at age 54 and be repeated every 5 years. If your lipid or cholesterol levels are high, you are over age 28, or you are at high risk for heart disease, you may need your cholesterol levels checked  more frequently.Ongoing high lipid and cholesterol levels should be treated with medicines if diet and exercise are not working.  If you smoke, find out from your health care provider how to quit. If you do not use tobacco, do not start.  Lung cancer screening is recommended for adults aged 29-80 years who are at high risk for developing lung cancer because of a history of smoking. A yearly low-dose CT scan of the lungs is recommended for people who have at least a 30-pack-year history of smoking and are current smokers or have quit within the past 15 years. A pack year of smoking is smoking an average of 1 pack of cigarettes a day for 1 year (for example, a 30-pack-year history of smoking could mean smoking 1 pack a day for 30 years or 2 packs a day for 15 years). Yearly screening should continue until the smoker has stopped smoking for at least 15 years. Yearly screening should be stopped for people who develop a health problem that would prevent them from having lung cancer treatment.  If you choose to drink alcohol, do not have more than 2 drinks per day. One drink is considered to be 12 oz (360 mL) of beer, 5 oz (150 mL) of wine, or 1.5 oz (45 mL) of liquor.  Avoid the use of street drugs. Do not share needles with anyone. Ask for help if you need support or instructions about stopping the use of drugs.  High blood pressure causes heart disease and increases the risk of stroke. High blood pressure is more likely to develop in:  People who have blood pressure in the end of the normal range (100-139/85-89 mm Hg).  People who are overweight or obese.  People who are African American.  If you are 22-25 years of age, have your blood pressure checked every 3-5 years. If you are 54 years of age or older, have your blood pressure checked every year. You should have your blood pressure measured twice--once when you are at a hospital or clinic, and once when you are not at a hospital or clinic. Record  the average of the two measurements. To check your blood pressure when you are not at a hospital or clinic, you can use:  An automated blood pressure machine at a pharmacy.  A home blood pressure monitor.  If you are 45-68 years old, ask your health care provider if you should take aspirin to prevent heart disease.  Diabetes screening involves taking a blood sample to check your fasting blood sugar level. This should be done once every 3 years after age 96 if you are at a normal weight and without risk factors for diabetes. Testing should be considered at a younger age or be carried out more frequently if you are overweight and have at least 1 risk factor for diabetes.  Colorectal cancer can be detected and often prevented. Most routine colorectal cancer screening begins at the age of 28 and continues through age 66. However, your health care provider may recommend screening at an earlier age if you have risk factors for colon cancer. On a yearly basis, your health care provider may provide home test kits to check for hidden blood in the stool. A small camera at the end of a tube may be used to directly examine the colon (sigmoidoscopy or colonoscopy) to detect the earliest forms of colorectal cancer. Talk to your health care provider about this at age 53 when routine screening begins. A direct exam of the colon should be repeated every 5-10 years through age 74, unless early forms of precancerous polyps or small growths are found.  People who are at an increased risk for hepatitis B should be screened for this virus. You are considered at high risk for hepatitis B if:  You were born in a country where hepatitis B occurs often. Talk with your health care provider about which countries are considered high risk.  Your parents were born in a high-risk country and you have not received a shot to protect against hepatitis B (hepatitis B vaccine).  You have HIV or AIDS.  You use needles to inject  street drugs.  You live with, or have sex with, someone who has hepatitis B.  You are a man who has sex with other men (MSM).  You get hemodialysis treatment.  You take certain medicines for conditions like cancer, organ transplantation, and autoimmune conditions.  Hepatitis C blood testing is recommended for all people born from [birth year] through 1966 and any individual with known risk factors for hepatitis C.  Healthy men should no longer receive prostate-specific antigen (PSA) blood tests as part of routine cancer screening. Talk to your health care provider about prostate cancer screening.  Testicular cancer screening is not recommended for adolescents or adult males who have no symptoms. Screening includes self-exam, a health care provider exam, and other screening tests. Consult with your health care provider about any symptoms you have or any  concerns you have about testicular cancer.  Practice safe sex. Use condoms and avoid high-risk sexual practices to reduce the spread of sexually transmitted infections (STIs).  You should be screened for STIs, including gonorrhea and chlamydia if:  You are sexually active and are younger than 24 years.  You are older than 24 years, and your health care provider tells you that you are at risk for this type of infection.  Your sexual activity has changed since you were last screened, and you are at an increased risk for chlamydia or gonorrhea. Ask your health care provider if you are at risk.  If you are at risk of being infected with HIV, it is recommended that you take a prescription medicine daily to prevent HIV infection. This is called pre-exposure prophylaxis (PrEP). You are considered at risk if:  You are a man who has sex with other men (MSM).  You are a heterosexual man who is sexually active with multiple partners.  You take drugs by injection.  You are sexually active with a partner who has HIV.  Talk with your health care provider  about whether you are at high risk of being infected with HIV. If you choose to begin PrEP, you should first be tested for HIV. You should then be tested every 3 months for as long as you are taking PrEP.  Use sunscreen. Apply sunscreen liberally and repeatedly throughout the day. You should seek shade when your shadow is shorter than you. Protect yourself by wearing long sleeves, pants, a wide-brimmed hat, and sunglasses year round whenever you are outdoors.  Tell your health care provider of new moles or changes in moles, especially if there is a change in shape or color. Also, tell your health care provider if a mole is larger than the size of a pencil eraser.  A one-time screening for abdominal aortic aneurysm (AAA) and surgical repair of large AAAs by ultrasound is recommended for men aged 22-75 years who are current or former smokers.  Stay current with your vaccines (immunizations).   This information is not intended to replace advice given to you by your health care provider. Make sure you discuss any questions you have with your health care provider.   Document Released: 05/22/2008 Document Revised: 12/15/2014 Document Reviewed: 04/21/2011 Elsevier Interactive Patient Education [employer].

## 2016-01-31 NOTE — Assessment & Plan Note (Signed)
On simvastatin 40 mg daily without problems. Checking lipid panel and adjust as needed.

## 2016-01-31 NOTE — Assessment & Plan Note (Signed)
Still smoking some and no desire to quit right now. Is consciously not increasing his cigarettes. Reminded about the health risk from cigarette smoke.

## 2016-01-31 NOTE — Assessment & Plan Note (Signed)
BP at goal on lisinopril and metoprolol. Checking labs and adjust as needed.

## 2016-01-31 NOTE — Assessment & Plan Note (Signed)
Flu shot given at visit to update immunizations. Colonoscopy due later this year. Declines shingles shot right now. Counseled on sun safety and mole surveillance. Given 10 year screening recommendations.

## 2016-02-08 ENCOUNTER — Encounter: Payer: Self-pay | Admitting: Gastroenterology

## 2016-03-26 ENCOUNTER — Encounter: Payer: Self-pay | Admitting: Gastroenterology

## 2016-04-30 DIAGNOSIS — M5136 Other intervertebral disc degeneration, lumbar region: Secondary | ICD-10-CM | POA: Diagnosis not present

## 2016-04-30 DIAGNOSIS — Z79891 Long term (current) use of opiate analgesic: Secondary | ICD-10-CM | POA: Diagnosis not present

## 2016-04-30 DIAGNOSIS — G894 Chronic pain syndrome: Secondary | ICD-10-CM | POA: Diagnosis not present

## 2016-05-01 ENCOUNTER — Telehealth: Payer: Self-pay | Admitting: Internal Medicine

## 2016-05-01 NOTE — Telephone Encounter (Signed)
returned call and r/s appt per pt request.....pt ok and aware of new d.t °

## 2016-05-26 ENCOUNTER — Other Ambulatory Visit (HOSPITAL_BASED_OUTPATIENT_CLINIC_OR_DEPARTMENT_OTHER): Payer: PPO

## 2016-05-26 DIAGNOSIS — D696 Thrombocytopenia, unspecified: Secondary | ICD-10-CM

## 2016-05-26 DIAGNOSIS — D472 Monoclonal gammopathy: Secondary | ICD-10-CM

## 2016-05-26 LAB — CBC WITH DIFFERENTIAL/PLATELET
BASO%: 0.4 % (ref 0.0–2.0)
Basophils Absolute: 0 10e3/uL (ref 0.0–0.1)
EOS%: 8.4 % — ABNORMAL HIGH (ref 0.0–7.0)
Eosinophils Absolute: 0.7 10e3/uL — ABNORMAL HIGH (ref 0.0–0.5)
HCT: 41.9 % (ref 38.4–49.9)
HGB: 13.9 g/dL (ref 13.0–17.1)
LYMPH%: 23.8 % (ref 14.0–49.0)
MCH: 30.6 pg (ref 27.2–33.4)
MCHC: 33.2 g/dL (ref 32.0–36.0)
MCV: 92.3 fL (ref 79.3–98.0)
MONO#: 0.6 10e3/uL (ref 0.1–0.9)
MONO%: 7.1 % (ref 0.0–14.0)
NEUT#: 5 10e3/uL (ref 1.5–6.5)
NEUT%: 60.3 % (ref 39.0–75.0)
Platelets: 146 10e3/uL (ref 140–400)
RBC: 4.54 10e6/uL (ref 4.20–5.82)
RDW: 13.5 % (ref 11.0–14.6)
WBC: 8.3 10e3/uL (ref 4.0–10.3)
lymph#: 2 10e3/uL (ref 0.9–3.3)

## 2016-05-26 LAB — COMPREHENSIVE METABOLIC PANEL
ALBUMIN: 4 g/dL (ref 3.5–5.0)
ALK PHOS: 73 U/L (ref 40–150)
ALT: 16 U/L (ref 0–55)
AST: 19 U/L (ref 5–34)
Anion Gap: 8 mEq/L (ref 3–11)
BUN: 17.1 mg/dL (ref 7.0–26.0)
CALCIUM: 9.4 mg/dL (ref 8.4–10.4)
CO2: 25 mEq/L (ref 22–29)
CREATININE: 0.8 mg/dL (ref 0.7–1.3)
Chloride: 106 mEq/L (ref 98–109)
EGFR: 87 mL/min/{1.73_m2} — ABNORMAL LOW (ref >90)
Glucose: 102 mg/dl (ref 70–140)
Potassium: 4.2 mEq/L (ref 3.5–5.1)
Sodium: 140 mEq/L (ref 136–145)
Total Bilirubin: 0.43 mg/dL (ref 0.20–1.20)
Total Protein: 7.4 g/dL (ref 6.4–8.3)

## 2016-05-26 LAB — LACTATE DEHYDROGENASE: LDH: 143 U/L (ref 125–245)

## 2016-05-27 LAB — IGG, IGA, IGM
IGA/IMMUNOGLOBULIN A, SERUM: 61 mg/dL (ref 61–437)
IGM (IMMUNOGLOBIN M), SRM: 37 mg/dL (ref 15–143)
IgG, Qn, Serum: 824 mg/dL (ref 700–1600)

## 2016-05-27 LAB — KAPPA/LAMBDA LIGHT CHAINS
Ig Kappa Free Light Chain: 18.7 mg/L (ref 3.3–19.4)
Ig Lambda Free Light Chain: 340.8 mg/L — ABNORMAL HIGH (ref 5.7–26.3)
KAPPA/LAMBDA FLC RATIO: 0.05 — AB (ref 0.26–1.65)

## 2016-05-27 LAB — BETA 2 MICROGLOBULIN, SERUM: Beta-2: 1.8 mg/L (ref 0.6–2.4)

## 2016-06-02 ENCOUNTER — Ambulatory Visit: Payer: Medicare Other | Admitting: Internal Medicine

## 2016-06-02 DIAGNOSIS — M5136 Other intervertebral disc degeneration, lumbar region: Secondary | ICD-10-CM | POA: Diagnosis not present

## 2016-06-04 ENCOUNTER — Telehealth: Payer: Self-pay | Admitting: Internal Medicine

## 2016-06-04 ENCOUNTER — Ambulatory Visit (HOSPITAL_BASED_OUTPATIENT_CLINIC_OR_DEPARTMENT_OTHER): Payer: PPO | Admitting: Internal Medicine

## 2016-06-04 ENCOUNTER — Encounter: Payer: Self-pay | Admitting: Internal Medicine

## 2016-06-04 VITALS — BP 140/68 | HR 85 | Temp 97.9°F | Resp 18 | Ht 70.0 in | Wt 171.4 lb

## 2016-06-04 DIAGNOSIS — D472 Monoclonal gammopathy: Secondary | ICD-10-CM | POA: Diagnosis not present

## 2016-06-04 DIAGNOSIS — M545 Low back pain: Secondary | ICD-10-CM

## 2016-06-04 DIAGNOSIS — G8929 Other chronic pain: Secondary | ICD-10-CM

## 2016-06-04 DIAGNOSIS — D696 Thrombocytopenia, unspecified: Secondary | ICD-10-CM | POA: Diagnosis not present

## 2016-06-04 NOTE — Progress Notes (Signed)
[facility] [phone]   Fax:([phone]) [place], [place] [phone]  DIAGNOSIS:  1) monoclonal history of undetermined significance. 2) thrombocytopenia  PRIOR THERAPY: None  CURRENT THERAPY: Observation  INTERVAL HISTORY: [name] [name] 75 y.o. male returns to the clinic today for follow-up visit. He has a diagnosis of monoclonal gammopathy and thrombocytopenia for several years. He is feeling fine today with no specific complaints. The patient denied having any significant weight loss or night sweats. He has no chest pain, shortness of breath, cough or hemoptysis. He has no nausea or vomiting, no fever or chills. He had repeat myeloma panel performed earlier today and is here for evaluation and discussion of his lab results.  MEDICAL HISTORY: Past Medical History  Diagnosis Date  . Thrombocytopenia ([place])     Dr. Ralene Ok  . Hypertension   . Dyslipidemia   . Ejection fraction     EF 55%, nuclear 2009  . Hx of CABG     2004  . Back pain     10/2010  . RBBB (right bundle branch block with left anterior fascicular block)   . HTN (hypertension)   . Hx of coronary artery bypass graft 2004    5  vesseel bypass graft  . CAD (coronary artery disease)   . Back pain     November, 2013  . Carotid bruit     November, 2013  . Carotid artery disease ([place])     Doppler, November, 2013, 0-39% bilateral  . Cataract   . DDD (degenerative disc disease), lumbar   . Temporary low platelet count ([facility])     ALLERGIES:  is allergic to penicillins.  MEDICATIONS:  Current Outpatient Prescriptions  Medication Sig Dispense Refill  . aspirin 81 MG tablet Take 81 mg by mouth daily.      . diazepam (VALIUM) 10 MG tablet Take 1 tablet (10 mg total) by mouth every 6 (six) hours as needed. 30 tablet 0  . docusate sodium (COLACE) 100 MG capsule Take 100 mg by mouth 3 (three) times daily.     Marland Kitchen  esomeprazole (NEXIUM) 20 MG capsule Take 20 mg by mouth daily at 12 noon.    . finasteride (PROSCAR) 5 MG tablet Take 5 mg by mouth daily.    . fluticasone (FLONASE) 50 MCG/ACT nasal spray Place 2 sprays into both nostrils daily. 16 g 6  . lisinopril (PRINIVIL,ZESTRIL) 10 MG tablet TAKE 1 (10 MG TOTAL) BY MOUTH DAILY 90 tablet 3  . metoprolol (LOPRESSOR) 50 MG tablet Take 1 tablet (50 mg total) by mouth 2 (two) times daily. 180 tablet 3  . Multiple Vitamins-Minerals (CENTRUM SILVER ULTRA MENS PO) Take by mouth daily.      . NEOMYCIN-POLYMYXIN-HYDROCORTISONE (CORTISPORIN) 1 % SOLN otic solution Place 3 drops into both ears 3 (three) times daily. 45 mL 3  . simvastatin (ZOCOR) 40 MG tablet Take 1 tablet (40 mg total) by mouth at bedtime. 90 tablet 3  . tamsulosin (FLOMAX) 0.4 MG CAPS capsule Take 0.4 mg by mouth daily.   2  . traMADol (ULTRAM) 50 MG tablet Take 100 mg by mouth every 6 (six) hours as needed for severe pain. Maximum dose= 8 tablets per day    . nitroGLYCERIN (NITROSTAT) 0.4 MG SL tablet Place 1 tablet (0.4 mg total) under the tongue every 5 (five) minutes as needed for chest pain. (Patient not taking: Reported  on 06/04/2016) 25 tablet 3   No current facility-administered medications for this visit.    SURGICAL HISTORY:  Past Surgical History  Procedure Laterality Date  . Coronary artery bypass graft  2004  . Thr right  Nov '04  . Eye surgery  2013    bilateral cataract extraction w/ IOL (Dr. Celene Squibb)  . Total hip arthroplasty      rigth hip    REVIEW OF SYSTEMS:  A comprehensive review of systems was negative except for: Musculoskeletal: positive for back pain   PHYSICAL EXAMINATION: General appearance: alert, cooperative and no distress Head: Normocephalic, without obvious abnormality, atraumatic Neck: no adenopathy, no JVD, supple, symmetrical, trachea midline and thyroid not enlarged, symmetric, no tenderness/mass/nodules Lymph nodes: Cervical, supraclavicular, and  axillary nodes normal. Resp: clear to auscultation bilaterally Back: symmetric, no curvature. ROM normal. No CVA tenderness. Cardio: regular rate and rhythm, S1, S2 normal, no murmur, click, rub or gallop GI: soft, non-tender; bowel sounds normal; no masses,  no organomegaly Extremities: extremities normal, atraumatic, no cyanosis or edema  ECOG PERFORMANCE STATUS: 0 - Asymptomatic  Blood pressure 140/68, pulse 85, temperature 97.9 F (36.6 C), temperature source Oral, resp. rate 18, height 5\' 10"  (1.778 m), weight 171 lb 6.4 oz (77.747 kg), SpO2 99 %.  LABORATORY DATA: Lab Results  Component Value Date   WBC 8.3 05/26/2016   HGB 13.9 05/26/2016   HCT 41.9 05/26/2016   MCV 92.3 05/26/2016   PLT 146 05/26/2016      Chemistry      Component Value Date/Time   NA 140 05/26/2016 1018   NA 139 01/16/2012 1322   K 4.2 05/26/2016 1018   K 4.1 01/16/2012 1322   CL 108* 05/23/2013 1514   CL 105 01/16/2012 1322   CO2 25 05/26/2016 1018   CO2 26 01/16/2012 1322   BUN 17.1 05/26/2016 1018   BUN 13 01/16/2012 1322   CREATININE 0.8 05/26/2016 1018   CREATININE 1.04 01/16/2012 1322      Component Value Date/Time   CALCIUM 9.4 05/26/2016 1018   CALCIUM 9.3 01/16/2012 1322   ALKPHOS 73 05/26/2016 1018   ALKPHOS 71 12/06/2012 1424   AST 19 05/26/2016 1018   AST 21 12/06/2012 1424   ALT 16 05/26/2016 1018   ALT 19 12/06/2012 1424   BILITOT 0.43 05/26/2016 1018   BILITOT 0.6 12/06/2012 1424       RADIOGRAPHIC STUDIES: No results found.  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 The patient has been observation for many years with no significant progression of his disease. His myeloma panel performed recently showed no concerning findings for disease progression except for slight increase of the lambda light chain. I recommended for  the patient to continue on observation with repeat myeloma panel in one year. For the chronic back pain, the patient is followed by orthopedic surgery. He was advised to call immediately if he has any concerning symptoms in the interval. The patient voices understanding of current disease status and treatment options and is in agreement with the current care plan.  All questions were answered. The patient knows to call the clinic with any problems, questions or concerns. We can certainly see the patient much sooner if necessary.  Disclaimer: This note was dictated with voice recognition software. Similar sounding words can inadvertently be transcribed and may not be corrected upon review.

## 2016-06-04 NOTE — Telephone Encounter (Signed)
Gave pt cal & avs °

## 2016-06-06 DIAGNOSIS — M5136 Other intervertebral disc degeneration, lumbar region: Secondary | ICD-10-CM | POA: Diagnosis not present

## 2016-06-06 DIAGNOSIS — I714 Abdominal aortic aneurysm, without rupture: Secondary | ICD-10-CM | POA: Diagnosis not present

## 2016-06-14 DIAGNOSIS — M5136 Other intervertebral disc degeneration, lumbar region: Secondary | ICD-10-CM | POA: Diagnosis not present

## 2016-06-23 ENCOUNTER — Ambulatory Visit (INDEPENDENT_AMBULATORY_CARE_PROVIDER_SITE_OTHER): Payer: PPO | Admitting: Internal Medicine

## 2016-06-23 ENCOUNTER — Encounter: Payer: Self-pay | Admitting: Internal Medicine

## 2016-06-23 VITALS — BP 158/90 | HR 65 | Temp 97.8°F | Resp 18 | Ht 70.0 in | Wt 171.4 lb

## 2016-06-23 DIAGNOSIS — I714 Abdominal aortic aneurysm, without rupture, unspecified: Secondary | ICD-10-CM | POA: Insufficient documentation

## 2016-06-23 NOTE — Progress Notes (Signed)
Pre visit review using our clinic review tool, if applicable. No additional management support is needed unless otherwise documented below in the visit note. 

## 2016-06-23 NOTE — Patient Instructions (Signed)
We will get the ultrasound of the aorta to see the size of it.   On the disc you cannot see the aorta well and it appears to be right around 4 cm but we cannot see the full size of it.   We will call back with the results.

## 2016-06-23 NOTE — Progress Notes (Signed)
   Subjective:    Patient ID: [name] [name], male    DOB: [birth year], 75 y.o.   MRN: [id number]  HPI The patient is a 75 YO man coming in for possible AAA. He was seeing back doctor and they did an MRI of the back. This saw increased size aorta without visualization. This was independently reviewed during the visit and shadowing blocks visualization of about 50% of the aorta and visualized portion has about 4 cm diameter at maximal visualization. He is a smoker and still smoking. He is anxious to have the back surgery done and wants to get this cleared up.   Review of Systems  Constitutional: Negative for fever, activity change, appetite change, fatigue and unexpected weight change.  HENT: Negative.   Eyes: Negative.   Respiratory: Negative for cough, chest tightness, shortness of breath and wheezing.   Cardiovascular: Negative for chest pain, palpitations and leg swelling.  Gastrointestinal: Negative for abdominal pain, diarrhea, constipation and abdominal distention.  Musculoskeletal: Positive for back pain and arthralgias. Negative for gait problem.  Skin: Negative.   Neurological: Negative.   Psychiatric/Behavioral: Negative.       Objective:   Physical Exam  Constitutional: He is oriented to person, place, and time. He appears well-developed and well-nourished.  HENT:  Head: Normocephalic and atraumatic.  Eyes: EOM are normal.  Neck: Normal range of motion.  Cardiovascular: Normal rate and regular rhythm.   Pulmonary/Chest: Effort normal and breath sounds normal. No respiratory distress. He has no wheezes.  Abdominal: Soft. Bowel sounds are normal. He exhibits no distension and no mass. There is no tenderness.  Musculoskeletal: He exhibits no edema.  Neurological: He is alert and oriented to person, place, and time. Coordination normal.  Skin: Skin is warm and dry.   Filed Vitals:   06/23/16 0853  BP: 160/90  Pulse: 65  Temp: 97.8 F (36.6 C)  TempSrc: Oral  Resp: 18    Height: 5\' 10"  (1.778 m)  Weight: 171 lb 6.4 oz (77.747 kg)  SpO2: 96%      Assessment & Plan:

## 2016-06-23 NOTE — Assessment & Plan Note (Signed)
Appears to be about 4 cm on the visualized portion on MR lumbar reviewed independently at the visit. Ordered US AAA to check size since we were not able to visualize about 50% of the aorta on the MR exam. He does have visit with vascular in August and depending on results of [place] may need to keep. If aorta <4 cm does not need vascular visit. Reminded him that smoking is one of the biggest risk factors for the aneurysm growing and he will work to quit.

## 2016-07-01 ENCOUNTER — Other Ambulatory Visit: Payer: Self-pay | Admitting: Vascular Surgery

## 2016-07-01 DIAGNOSIS — I714 Abdominal aortic aneurysm, without rupture, unspecified: Secondary | ICD-10-CM

## 2016-07-02 ENCOUNTER — Other Ambulatory Visit: Payer: Self-pay | Admitting: Vascular Surgery

## 2016-07-02 DIAGNOSIS — I714 Abdominal aortic aneurysm, without rupture, unspecified: Secondary | ICD-10-CM

## 2016-07-17 ENCOUNTER — Encounter: Payer: Self-pay | Admitting: Vascular Surgery

## 2016-07-22 ENCOUNTER — Ambulatory Visit (INDEPENDENT_AMBULATORY_CARE_PROVIDER_SITE_OTHER): Payer: PPO | Admitting: Vascular Surgery

## 2016-07-22 ENCOUNTER — Encounter: Payer: Self-pay | Admitting: Vascular Surgery

## 2016-07-22 ENCOUNTER — Ambulatory Visit (HOSPITAL_COMMUNITY)
Admission: RE | Admit: 2016-07-22 | Discharge: 2016-07-22 | Disposition: A | Discharge status: Home / Self Care | Payer: PPO | Source: Ambulatory Visit | Attending: Vascular Surgery | Admitting: Vascular Surgery

## 2016-07-22 VITALS — BP 135/84 | HR 67 | Ht 70.0 in | Wt 170.8 lb

## 2016-07-22 DIAGNOSIS — I10 Essential (primary) hypertension: Secondary | ICD-10-CM | POA: Insufficient documentation

## 2016-07-22 DIAGNOSIS — I714 Abdominal aortic aneurysm, without rupture, unspecified: Secondary | ICD-10-CM

## 2016-07-22 DIAGNOSIS — E785 Hyperlipidemia, unspecified: Secondary | ICD-10-CM | POA: Diagnosis not present

## 2016-07-22 DIAGNOSIS — I2510 Atherosclerotic heart disease of native coronary artery without angina pectoris: Secondary | ICD-10-CM | POA: Insufficient documentation

## 2016-07-22 NOTE — Progress Notes (Signed)
Patient name: [name] [name] MRN: [id number] DOB: January 11, 1941 Sex: male  REASON FOR CONSULT: Abdominal aortic aneurysm  HPI: [name] [name] is a 75 y.o. male, in today for discussion of recent diagnosis of abdominal aortic aneurysm. He is here today with his wife. Some suggestion that he may of had this diagnosis several years ago but does not recall this. On recent lumbar spine films was found to have a 4 cm aneurysm. This was incompletely visualized and therefore he is seen today for further discussion and also for ultrasound for true the finding of his current level of aneurysmal change. He has no symptoms referable to his aneurysm. Does have severe degenerative disc disease. Does have a history of carotid bruit and I did review his prior carotid duplex most recently from November 2015 showing no evidence of carotid stenosis. Have significant cardiac history having undergone coronary bypass grafting in the past. He does have a severe back pain and this is causing sciatic pain is well. Makes it difficult for him to stand and sit. This is been going on for months and he has tolerated this and to the point where he can no longer tolerate this. He is contemplating back surgery for correction of this.  Past Medical History:  Diagnosis Date  . AAA (abdominal aortic aneurysm) ([place])   . Back pain    10/2010  . Back pain    November, 2013  . CAD (coronary artery disease)   . Carotid artery disease ([place])    Doppler, November, 2013, 0-39% bilateral  . Carotid bruit    November, 2013  . Cataract   . DDD (degenerative disc disease), lumbar   . Dyslipidemia   . Ejection fraction    EF 55%, nuclear 2009  . HTN (hypertension)   . Hx of CABG    2004  . 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 Hypertension   . Myocardial infarction ([place])   . RBBB (right bundle branch block with left anterior fascicular block)   . Temporary low platelet count ([place])   .  Thrombocytopenia ([place])    Dr. Ralene Ok    Family History  Problem Relation Age of Onset  . Kidney disease Mother   . COPD Mother   . Stroke Father     cerebral hemorrhage  . Heart disease Brother   . Heart disease Brother   . Arthritis Brother   . Cancer Paternal Grandfather     SOCIAL HISTORY: Social History   Social History  . Marital status: Married    Spouse name: N/A  . Number of children: 2  . Years of education: 12   Occupational History  . trucking  Retired   Social History Main Topics  . Smoking status: Heavy Tobacco Smoker    Packs/day: 0.50    Years: 45.00    Types: Cigarettes  . Smokeless tobacco: Never Used     Comment: half pack day  . Alcohol use No  . Drug use: No  . Sexual activity: Not Currently   Other Topics Concern  . Not on file   Social History Narrative   HSG, Married '62, 1 son-'70, 1 dtr - '68; 2 grandchildren. Work - Probation officer.   ACP - DNR, DNI, no artificial hydration or feeding, no heroic measures    Allergies  Allergen Reactions  . Penicillins Other (See Comments)    Passed out    Current Outpatient Prescriptions  Medication Sig Dispense Refill  . aspirin 81 MG tablet Take 81 mg by mouth daily.      . diazepam (VALIUM) 10 MG tablet Take 1 tablet (10 mg total) by mouth every 6 (six) hours as needed. 30 tablet 0  . docusate sodium (COLACE) 100 MG capsule Take 100 mg by mouth 3 (three) times daily.     Marland Kitchen esomeprazole (NEXIUM) 20 MG capsule Take 20 mg by mouth daily at 12 noon.    . finasteride (PROSCAR) 5 MG tablet Take 5 mg by mouth daily.    . fluticasone (FLONASE) 50 MCG/ACT nasal spray Place 2 sprays into both nostrils daily. 16 g 6  . lisinopril (PRINIVIL,ZESTRIL) 10 MG tablet TAKE 1 (10 MG TOTAL) BY MOUTH DAILY 90 tablet 3  . metoprolol (LOPRESSOR) 50 MG tablet Take 1 tablet (50 mg total) by mouth 2 (two) times daily. 180 tablet 3  . Multiple Vitamins-Minerals (CENTRUM SILVER ULTRA MENS PO) Take by mouth  daily.      . NEOMYCIN-POLYMYXIN-HYDROCORTISONE (CORTISPORIN) 1 % SOLN otic solution Place 3 drops into both ears 3 (three) times daily. 45 mL 3  . nitroGLYCERIN (NITROSTAT) 0.4 MG SL tablet Place 1 tablet (0.4 mg total) under the tongue every 5 (five) minutes as needed for chest pain. 25 tablet 3  . simvastatin (ZOCOR) 40 MG tablet Take 1 tablet (40 mg total) by mouth at bedtime. 90 tablet 3  . tamsulosin (FLOMAX) 0.4 MG CAPS capsule Take 0.4 mg by mouth daily.   2  . traMADol (ULTRAM) 50 MG tablet Take 100 mg by mouth every 6 (six) hours as needed for severe pain. Maximum dose= 8 tablets per day     No current facility-administered medications for this visit.     REVIEW OF SYSTEMS:  [X]  denotes positive finding, [ ]  denotes negative finding Cardiac  Comments:  Chest pain or chest pressure:    Shortness of breath upon exertion:    Short of breath when lying flat:    Irregular heart rhythm:        Vascular    Pain in calf, thigh, or hip brought on by ambulation:    Pain in feet at night that wakes you up from your sleep:     Blood clot in your veins:    Leg swelling:         Pulmonary    Oxygen at home:    Productive cough:     Wheezing:         Neurologic    Sudden weakness in arms or legs:     Sudden numbness in arms or legs:     Sudden onset of difficulty speaking or slurred speech:    Temporary loss of vision in one eye:     Problems with dizziness:         Gastrointestinal    Blood in stool:     Vomited blood:         Genitourinary    Burning when urinating:     Blood in urine:        Psychiatric    Major depression:         Hematologic    Bleeding problems:    Problems with blood clotting too easily:        Skin    Rashes or ulcers:        Constitutional    Fever or chills:      PHYSICAL EXAM: Vitals:   07/22/16 0849  BP: 135/84  Pulse: 67  SpO2: 98%  Weight: 170 lb 12.8 oz (77.5 kg)  Height: 5\' 10"  (1.778 m)    GENERAL: The patient is a  well-nourished male, in no acute distress. The vital signs are documented above. CARDIAC: There is a regular rate and rhythm.  VASCULAR: 2+ radial pulses bilaterally. 2+ posterior tibial pulses bilaterally PULMONARY: There is good air exchange bilaterally without wheezing or rales. ABDOMEN: Soft and non-tender with normal pitched bowel sounds. I do not palpate an aneurysm MUSCULOSKELETAL: There are no major deformities or cyanosis. NEUROLOGIC: No focal weakness or paresthesias are detected. SKIN: There are no ulcers or rashes noted. PSYCHIATRIC: The patient has a normal affect.  DATA:   Reviewed his lumbar spine film report and also reviewed his ultrasound with the patient. This does show maximal diameter of his aorta of 4.0 cm. 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  MEDICAL ISSUES:  Had a very extensive discussion with family regarding the significance of his asymptomatic small aneurysm. Explained that there is no risk currently for this and that we would recommend lifelong ultrasound follow-up. We will see him again in one year with repeat ultrasound. Did review symptoms of leaking aneurysm he knows report immediately to the emergency room should this occur. He does have a plans for upcoming surgery and I filled out a form explained that there is no contraindication for his back surgery related to his small to moderate size aneurysm. We will see him again in one year   Fleurette Woolbright [facility] [employer] [facility] [phone]

## 2016-07-24 ENCOUNTER — Encounter: Payer: Self-pay | Admitting: Physical Medicine and Rehabilitation

## 2016-07-31 ENCOUNTER — Ambulatory Visit: Payer: Self-pay | Admitting: Physician Assistant

## 2016-08-05 ENCOUNTER — Other Ambulatory Visit: Payer: Self-pay | Admitting: *Deleted

## 2016-08-05 ENCOUNTER — Ambulatory Visit (INDEPENDENT_AMBULATORY_CARE_PROVIDER_SITE_OTHER): Payer: PPO | Admitting: Urology

## 2016-08-05 DIAGNOSIS — I714 Abdominal aortic aneurysm, without rupture, unspecified: Secondary | ICD-10-CM

## 2016-08-05 DIAGNOSIS — N401 Enlarged prostate with lower urinary tract symptoms: Secondary | ICD-10-CM

## 2016-08-05 NOTE — Progress Notes (Signed)
Cardiology Office Note    Date:  08/06/2016   ID:  [name] [name], DOB 1941/10/06, MRN [id number]  PCP:  Hoyt Koch, MD  Cardiologist:  Dr. Ron Parker Dr. Meda Coffee  CC: pre op clearance- back surgery   History of Present Illness:  [name] [name] is a 75 y.o. male with a history of CAD s/p CABGx5V (2004), tobacco abuse, thrombocytopenia/MGUS, HTN, HLD, RBBB/LPFB, recently diagnosed small AAA followed by Dr. Donnetta Hutching and carotid artery disease who presents to clinic for pre op clearance prior to back surgery.  He underwent bypass surgery in 2004. Nuclear study in 2011 revealed no ischemia. His ejection fraction was 55%.  He was last seen by Dr Ron Parker in 01/2015. He was still smoking a couple cigarettes a day. No chest pain/SOB.  I saw him in clinic for yearly follow up in 01/2016. He was doing well with no complaints except for back pain that he thought may require surgery soon.   On recent lumbar spine films was found to have a 4 cm aneurysm. This was incompletely visualized and therefore he was sent to Dr. Donnetta Hutching for further discussion. Abdominal US on 07/22/16 showed 4.0 x 3.7 cm and 1.7 x 1.7 right common iliac artery dilatation.  It was felt to be small and asymptomatic. He recommended lifelong [place] follow up. He was cleared for back surgery from a AAA standpoint.   Today he presents to clinic for follow up. No chest pain or SOB with exertion. Still smoking 5 cigs a day. No LE edema, orthopnea or PND. He is not very active because of his back pain.  Going to get minimally invasive surgery for spinal fusion surgery. He really does not want a stress test and very anxious to get back surgery done.   Past Medical History:  Diagnosis Date  . AAA (abdominal aortic aneurysm) ([place])   . Back pain    10/2010  . Back pain    November, 2013  . CAD (coronary artery disease)   . Carotid artery disease ([place])    Doppler, November, 2013, 0-39% bilateral  . Carotid bruit    November, 2013  .  Cataract   . DDD (degenerative disc disease), lumbar   . Dyslipidemia   . Ejection fraction    EF 55%, nuclear 2009  . HTN (hypertension)   . Hx of CABG    2004  . 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 Hypertension   . Myocardial infarction ([place])   . RBBB (right bundle branch block with left anterior fascicular block)   . Temporary low platelet count ([place])   . Thrombocytopenia ([place])    Dr. Ralene Ok    Past Surgical History:  Procedure Laterality Date  . CORONARY ARTERY BYPASS GRAFT  2004  . EYE SURGERY  2013   bilateral cataract extraction w/ IOL (Dr. Celene Squibb)  . THR right  Nov '04  . TOTAL HIP ARTHROPLASTY     rigth hip    Current Medications: Outpatient Medications Prior to Visit  Medication Sig Dispense Refill  . aspirin 81 MG tablet Take 81 mg by mouth daily.      . diazepam (VALIUM) 10 MG tablet Take 1 tablet (10 mg total) by mouth every 6 (six) hours as needed. 30 tablet 0  . docusate sodium (COLACE) 100 MG capsule Take 100 mg by mouth 3 (three) times daily.     Marland Kitchen esomeprazole (NEXIUM) 20 MG capsule Take  20 mg by mouth daily at 12 noon.    . finasteride (PROSCAR) 5 MG tablet Take 5 mg by mouth daily.    . fluticasone (FLONASE) 50 MCG/ACT nasal spray Place 2 sprays into both nostrils daily. 16 g 6  . lisinopril (PRINIVIL,ZESTRIL) 10 MG tablet TAKE 1 (10 MG TOTAL) BY MOUTH DAILY 90 tablet 3  . metoprolol (LOPRESSOR) 50 MG tablet Take 1 tablet (50 mg total) by mouth 2 (two) times daily. 180 tablet 3  . Multiple Vitamins-Minerals (CENTRUM SILVER ULTRA MENS PO) Take by mouth daily.      . NEOMYCIN-POLYMYXIN-HYDROCORTISONE (CORTISPORIN) 1 % SOLN otic solution Place 3 drops into both ears 3 (three) times daily. 45 mL 3  . nitroGLYCERIN (NITROSTAT) 0.4 MG SL tablet Place 1 tablet (0.4 mg total) under the tongue every 5 (five) minutes as needed for chest pain. 25 tablet 3  . simvastatin (ZOCOR) 40 MG tablet Take 1 tablet (40 mg total) by mouth at  bedtime. 90 tablet 3  . tamsulosin (FLOMAX) 0.4 MG CAPS capsule Take 0.4 mg by mouth daily.   2  . traMADol (ULTRAM) 50 MG tablet Take 100 mg by mouth every 6 (six) hours as needed for severe pain. Maximum dose= 8 tablets per day     No facility-administered medications prior to visit.      Allergies:   Penicillins   Social History   Social History  . Marital status: Married    Spouse name: N/A  . Number of children: 2  . Years of education: 12   Occupational History  . trucking  Retired   Social History Main Topics  . Smoking status: Heavy Tobacco Smoker    Packs/day: 0.50    Years: 45.00    Types: Cigarettes  . Smokeless tobacco: Never Used     Comment: half pack day  . Alcohol use No  . Drug use: No  . Sexual activity: Not Currently   Other Topics Concern  . None   Social History Narrative   HSG, Married '62, 1 son-'70, 1 dtr - '68; 2 grandchildren. Work - Probation officer.   ACP - DNR, DNI, no artificial hydration or feeding, no heroic measures     Family History:  The patient's family history includes Arthritis in his brother; COPD in his mother; Cancer in his paternal grandfather; Heart disease in his brother and brother; Kidney disease in his mother; Stroke in his father.     ROS:   Please see the history of present illness.    ROS All other systems reviewed and are negative.   PHYSICAL EXAM:   VS:  BP 138/80   Pulse 68   Ht 5\' 10"  (1.778 m)   Wt 169 lb 1.9 oz (76.7 kg)   BMI 24.27 kg/m    GEN: Well nourished, well developed, in no acute distress  HEENT: normal  Neck: no JVD, carotid bruits, or masses Cardiac: RRR; no murmurs, rubs, or gallops,no edema  Respiratory:  clear to auscultation bilaterally, normal work of breathing GI: soft, nontender, nondistended, + BS MS: no deformity or atrophy  Skin: warm and dry, no rash Neuro:  Alert and Oriented x 3, Strength and sensation are intact Psych: euthymic mood, full affect  Wt Readings from  Last 3 Encounters:  08/06/16 169 lb 1.9 oz (76.7 kg)  07/22/16 170 lb 12.8 oz (77.5 kg)  06/23/16 171 lb 6.4 oz (77.7 kg)      Studies/Labs Reviewed:   EKG:  EKG is  ordered today.  The ekg ordered today demonstrates 1st de AV block, RBBB, LAFB HR 68  Recent Labs: 05/26/2016: ALT 16; BUN 17.1; Creatinine 0.8; HGB 13.9; Platelets 146; Potassium 4.2; Sodium 140   Lipid Panel    Component Value Date/Time   CHOL 142 01/31/2016 0847   TRIG 77.0 01/31/2016 0847   HDL 48.50 01/31/2016 0847   CHOLHDL 3 01/31/2016 0847   VLDL 15.4 01/31/2016 0847   LDLCALC 78 01/31/2016 0847    Additional studies/ records that were reviewed today include:  2D ECHO, abdominal US and myoview- see HPI   ASSESSMENT & PLAN:   Pre op clearance: I have cleared him for minimally invasive spine surgery. He has had no chest pain or SOB. He is not very physically active due to back pain making an exertional history difficult to obtain. We discussed pre op stress testing which the patient was very reluctant to do. I discussed with the DOD, Dr. Julianne Handler, who felt it was okay for him to proceed without prior pre op cardiac stress testing. The patient understands that he is at at least moderate risk for cardiac complications and accepts this.   CAD: continue ASA, statin and BB. No ischemic symptoms   HTN: BP well controlled. Continue lisinopril 10mg  and lopressor 50mg  BID  HLD: LDL close to goal in 01/2016. Continue simvastatin 40mg  daily.   Carotid artery disease: he is due for repeat dopplers in 06/2016. I will have this arranged.   Tobacco abuse: counseled on cessation.   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 Followed cloesly by heme/onc  AAA: Abdominal US on 07/22/16 showed 4.0 x 3.7 cm and 1.7 x 1.7 right common iliac artery dilatation. Stable and followed by Dr. Donnetta Hutching.   Medication Adjustments/Labs and Tests Ordered: Current medicines are reviewed at length with the patient today.   Concerns regarding medicines are outlined above.  Medication changes, Labs and Tests ordered today are listed in the Patient Instructions below. Patient Instructions  Medication Instructions:  Your physician recommends that you continue on your current medications as directed. Please refer to the Current Medication list given to you today.   Labwork: None ordered  Testing/Procedures: None ordered  Follow-Up: SEE DR. Meda Coffee AS PLANNED  Any Other Special Instructions Will Be Listed Below (If Applicable).     If you need a refill on your cardiac medications before your next appointment, please call your pharmacy.      Signed, Angelena Form, PA-C  08/06/2016 12:37 PM    [facility] [place], [place],   [address] Phone: [phone]; Fax: [phone]

## 2016-08-06 ENCOUNTER — Ambulatory Visit (INDEPENDENT_AMBULATORY_CARE_PROVIDER_SITE_OTHER): Payer: PPO | Admitting: Physician Assistant

## 2016-08-06 ENCOUNTER — Other Ambulatory Visit: Payer: Self-pay | Admitting: Physician Assistant

## 2016-08-06 ENCOUNTER — Encounter: Payer: Self-pay | Admitting: Physician Assistant

## 2016-08-06 ENCOUNTER — Telehealth: Payer: Self-pay | Admitting: *Deleted

## 2016-08-06 VITALS — BP 138/80 | HR 68 | Ht 70.0 in | Wt 169.1 lb

## 2016-08-06 DIAGNOSIS — I10 Essential (primary) hypertension: Secondary | ICD-10-CM

## 2016-08-06 DIAGNOSIS — I779 Disorder of arteries and arterioles, unspecified: Secondary | ICD-10-CM

## 2016-08-06 DIAGNOSIS — D472 Monoclonal gammopathy: Secondary | ICD-10-CM

## 2016-08-06 DIAGNOSIS — E785 Hyperlipidemia, unspecified: Secondary | ICD-10-CM

## 2016-08-06 DIAGNOSIS — I714 Abdominal aortic aneurysm, without rupture, unspecified: Secondary | ICD-10-CM

## 2016-08-06 DIAGNOSIS — I2510 Atherosclerotic heart disease of native coronary artery without angina pectoris: Secondary | ICD-10-CM

## 2016-08-06 DIAGNOSIS — I739 Peripheral vascular disease, unspecified: Principal | ICD-10-CM

## 2016-08-06 DIAGNOSIS — M4800 Spinal stenosis, site unspecified: Secondary | ICD-10-CM

## 2016-08-06 DIAGNOSIS — Z720 Tobacco use: Secondary | ICD-10-CM

## 2016-08-06 NOTE — Telephone Encounter (Signed)
lmptcb to let pt know Nell Range, PA-C forgot to let him know he is overdue for his Carotid US and we have ordered it and someone will call and get that arranged.

## 2016-08-06 NOTE — Patient Instructions (Addendum)
Medication Instructions:  Your physician recommends that you continue on your current medications as directed. Please refer to the Current Medication list given to you today.   Labwork: None ordered  Testing/Procedures: None ordered  Follow-Up: SEE DR. Meda Coffee AS PLANNED  Any Other Special Instructions Will Be Listed Below (If Applicable).     If you need a refill on your cardiac medications before your next appointment, please call your pharmacy.

## 2016-08-06 NOTE — Telephone Encounter (Signed)
Follow up   Pt verbalized that he is returning call

## 2016-08-07 NOTE — Telephone Encounter (Signed)
Returned pts call.  He has been made aware that he is over due for his US Carotid and to be aware that someone will be calling to get that scheduled.  Pt agreeable with this plan and verbalized understanding.

## 2016-08-13 NOTE — Pre-Procedure Instructions (Signed)
[name] [name]  08/13/2016      [facility] - [place], [address] [place] [address] Phone: [phone] Fax: [phone]    Your procedure is scheduled on September 14  Report to [facility] Admitting at 0800 A.M.  Call this number if you have problems the morning of surgery:  [phone]   Remember:  Do not eat food or drink liquids after midnight.   Take these medicines the morning of surgery with A SIP OF WATER diazepam (valium), esomeprazole (nexium), finasteride (proscar), fluticasone (flonase), metoprolol (lopressor), nitro if needed, tamsulosin (flomax), tramadol if needed   7 days prior to surgery STOP taking any Aspirin, Aleve, Naproxen, Ibuprofen, Motrin, Advil, Goody's, BC's, all herbal medications, fish oil, and all vitamins    Do not wear jewelry, make-up or nail polish.  Do not wear lotions, powders, or perfumes, or deoderant.  Do not shave 48 hours prior to surgery.    Do not bring valuables to the hospital.  [facility] is not responsible for any belongings or valuables.  Contacts, dentures or bridgework may not be worn into surgery.  Leave your suitcase in the car.  After surgery it may be brought to your room.  For patients admitted to the hospital, discharge time will be determined by your treatment team.  Patients discharged the day of surgery will not be allowed to drive home.    Special instructions:   [facility]- Preparing For Surgery  Before surgery, you can play an important role. Because skin is not sterile, your skin needs to be as free of germs as possible. You can reduce the number of germs on your skin by washing with CHG (chlorahexidine gluconate) Soap before surgery.  CHG is an antiseptic cleaner which kills germs and bonds with the skin to continue killing germs even after washing.  Please do not use if you have an allergy to CHG or antibacterial soaps. If  your skin becomes reddened/irritated stop using the CHG.  Do not shave (including legs and underarms) for at least 48 hours prior to first CHG shower. It is OK to shave your face.  Please follow these instructions carefully.   1. Shower the NIGHT BEFORE SURGERY and the MORNING OF SURGERY with CHG.   2. If you chose to wash your hair, wash your hair first as usual with your normal shampoo.  3. After you shampoo, rinse your hair and body thoroughly to remove the shampoo.  4. Use CHG as you would any other liquid soap. You can apply CHG directly to the skin and wash gently with a scrungie or a clean washcloth.   5. Apply the CHG Soap to your body ONLY FROM THE NECK DOWN.  Do not use on open wounds or open sores. Avoid contact with your eyes, ears, mouth and genitals (private parts). Wash genitals (private parts) with your normal soap.  6. Wash thoroughly, paying special attention to the area where your surgery will be performed.  7. Thoroughly rinse your body with warm water from the neck down.  8. DO NOT shower/wash with your normal soap after using and rinsing off the CHG Soap.  9. Pat yourself dry with a CLEAN TOWEL.   10. Wear CLEAN PAJAMAS   11. Place CLEAN SHEETS on your bed the night of your first shower and DO NOT SLEEP WITH PETS.    Day of Surgery: Do not apply any deodorants/lotions.  Please wear clean clothes to the hospital/surgery center.      Please read over the following fact sheets that you were given. Coughing and Deep Breathing, MRSA Information and Surgical Site Infection Prevention

## 2016-08-14 ENCOUNTER — Encounter (HOSPITAL_COMMUNITY): Payer: Self-pay

## 2016-08-14 ENCOUNTER — Encounter (HOSPITAL_COMMUNITY)
Admission: RE | Admit: 2016-08-14 | Discharge: 2016-08-14 | Disposition: A | Discharge status: Home / Self Care | Payer: PPO | Source: Ambulatory Visit | Attending: Orthopedic Surgery | Admitting: Orthopedic Surgery

## 2016-08-14 DIAGNOSIS — Z01818 Encounter for other preprocedural examination: Secondary | ICD-10-CM | POA: Insufficient documentation

## 2016-08-14 DIAGNOSIS — M4806 Spinal stenosis, lumbar region: Secondary | ICD-10-CM | POA: Diagnosis not present

## 2016-08-14 DIAGNOSIS — Z01812 Encounter for preprocedural laboratory examination: Secondary | ICD-10-CM | POA: Diagnosis not present

## 2016-08-14 HISTORY — DX: Monoclonal gammopathy: D47.2

## 2016-08-14 HISTORY — DX: Gastro-esophageal reflux disease without esophagitis: K21.9

## 2016-08-14 LAB — CBC
HEMATOCRIT: 43.5 % (ref 39.0–52.0)
Hemoglobin: 14.3 g/dL (ref 13.0–17.0)
MCH: 31 pg (ref 26.0–34.0)
MCHC: 32.9 g/dL (ref 30.0–36.0)
MCV: 94.2 fL (ref 78.0–100.0)
Platelets: 151 10*3/uL (ref 150–400)
RBC: 4.62 MIL/uL (ref 4.22–5.81)
RDW: 13.5 % (ref 11.5–15.5)
WBC: 8.9 10*3/uL (ref 4.0–10.5)

## 2016-08-14 LAB — SURGICAL PCR SCREEN
MRSA, PCR: NEGATIVE
Staphylococcus aureus: NEGATIVE

## 2016-08-14 LAB — BASIC METABOLIC PANEL
Anion gap: 8 (ref 5–15)
BUN: 12 mg/dL (ref 6–20)
CHLORIDE: 107 mmol/L (ref 101–111)
CO2: 25 mmol/L (ref 22–32)
CREATININE: 0.72 mg/dL (ref 0.61–1.24)
Calcium: 9.5 mg/dL (ref 8.9–10.3)
GFR calc Af Amer: >60 mL/min (ref >60)
GFR calc non Af Amer: >60 mL/min (ref >60)
GLUCOSE: 104 mg/dL — AB (ref 65–99)
POTASSIUM: 4.9 mmol/L (ref 3.5–5.1)
Sodium: 140 mmol/L (ref 135–145)

## 2016-08-14 NOTE — Progress Notes (Addendum)
PCP - Pricilla Holm Cardiologist - Meda Coffee -   Chest x-ray -not needed  EKG - 08/06/16 Stress Test - 10/15/10 ECHO - 09/17/03 Cardiac Cath - >  20 years ago  Sending to anesthesia for cardiac clearance  10:30 AM Found cardiac clearance in EPIC - note instructed patient to continue to take his 81 mg of aspirin, instructed patient to continue to take his aspirin    Patient denies shortness of breath, fever, cough and chest pain at PAT appointment

## 2016-08-18 ENCOUNTER — Ambulatory Visit: Payer: Self-pay | Admitting: Physician Assistant

## 2016-08-18 DIAGNOSIS — M5136 Other intervertebral disc degeneration, lumbar region: Secondary | ICD-10-CM | POA: Diagnosis not present

## 2016-08-19 ENCOUNTER — Encounter (HOSPITAL_COMMUNITY): Payer: Self-pay

## 2016-08-19 NOTE — Progress Notes (Signed)
Anesthesia Chart Review:  Pt is a 75 year old male scheduled for L3-4 decompression on 09/04/2016 with Melina Schools, MD.   - Cardiologist is [name], MD; last office visit 08/06/16 with Angelena Form, PA who cleared pt for surgery - Vascular surgeon is Curt Jews, MD who is following AAA and who cleared pt for surgery - Hematologist is Curt Bears, MD.  - PCP is Pricilla Holm, MD.   PMH includes:  CAD ([place], s/p CABG 2004), AAA, HTN, hyperlipidemia, thrombocytopenia, GERD. Current smoker. BMI 24.5.  Medications include: ASA, nexium, lisinopril, metoprolol, simvastatin  Preoperative labs reviewed.    EKG 08/06/16: sinus rhythm with 1st degree AV block. RBBB. LAFB.   Abdominal aorta duplex 07/22/16:  1. 4.0 x 3.7 cm distal aortic aneurysm.   2. 1.7 x 1.7 R CIA dilitation  Carotid duplex 10/31/14: Stable 1-39% bilateral ICA stenosis  Nuclear stress test 10/15/10: Low risk nuclear study  If no changes, I anticipate pt can proceed with surgery as scheduled.   Willeen Cass, FNP-BC [facility]/Anesthesiology Phone: [phone] 08/19/2016 1:52 PM

## 2016-09-03 ENCOUNTER — Encounter (HOSPITAL_COMMUNITY): Payer: Self-pay | Admitting: *Deleted

## 2016-09-03 NOTE — Progress Notes (Signed)
Pt denies SOB and chest pain but is under the care of Dr. Meda Coffee, Cardiology.  Pt stated that he was instructed to continue taking Aspirin. Pt made aware to stop taking vitamins, fish oil, and herbal medications. Do not take any NSAIDs ie: Ibuprofen, Advil, Naproxen, BC and Goody Powder. Collie Siad, Nursing Staff, clarified that MD advised that pt not take Aspirin on DOS. Pt called back and instructed to not take Aspirin the morning of procedure. Pt verbalized understanding of all pre-op instructions.

## 2016-09-04 ENCOUNTER — Encounter (HOSPITAL_COMMUNITY)
Admission: RE | Disposition: A | Discharge status: Home / Self Care | Payer: Self-pay | Source: Ambulatory Visit | Attending: Orthopedic Surgery

## 2016-09-04 ENCOUNTER — Observation Stay (HOSPITAL_COMMUNITY)
Admission: RE | Admit: 2016-09-04 | Discharge: 2016-09-05 | Disposition: A | Discharge status: Home / Self Care | Payer: PPO | Source: Ambulatory Visit | Attending: Orthopedic Surgery | Admitting: Orthopedic Surgery

## 2016-09-04 ENCOUNTER — Inpatient Hospital Stay (HOSPITAL_COMMUNITY): Payer: PPO | Admitting: Emergency Medicine

## 2016-09-04 ENCOUNTER — Encounter (HOSPITAL_COMMUNITY): Payer: Self-pay | Admitting: *Deleted

## 2016-09-04 ENCOUNTER — Inpatient Hospital Stay (HOSPITAL_COMMUNITY): Payer: PPO

## 2016-09-04 ENCOUNTER — Inpatient Hospital Stay (HOSPITAL_COMMUNITY): Payer: PPO | Admitting: Anesthesiology

## 2016-09-04 DIAGNOSIS — Z7982 Long term (current) use of aspirin: Secondary | ICD-10-CM | POA: Insufficient documentation

## 2016-09-04 DIAGNOSIS — Z951 Presence of aortocoronary bypass graft: Secondary | ICD-10-CM | POA: Insufficient documentation

## 2016-09-04 DIAGNOSIS — Z96641 Presence of right artificial hip joint: Secondary | ICD-10-CM | POA: Diagnosis not present

## 2016-09-04 DIAGNOSIS — Z981 Arthrodesis status: Secondary | ICD-10-CM | POA: Diagnosis not present

## 2016-09-04 DIAGNOSIS — I10 Essential (primary) hypertension: Secondary | ICD-10-CM | POA: Insufficient documentation

## 2016-09-04 DIAGNOSIS — M488X6 Other specified spondylopathies, lumbar region: Secondary | ICD-10-CM | POA: Diagnosis not present

## 2016-09-04 DIAGNOSIS — F17210 Nicotine dependence, cigarettes, uncomplicated: Secondary | ICD-10-CM | POA: Diagnosis not present

## 2016-09-04 DIAGNOSIS — I252 Old myocardial infarction: Secondary | ICD-10-CM | POA: Insufficient documentation

## 2016-09-04 DIAGNOSIS — M4806 Spinal stenosis, lumbar region: Principal | ICD-10-CM | POA: Insufficient documentation

## 2016-09-04 DIAGNOSIS — I739 Peripheral vascular disease, unspecified: Secondary | ICD-10-CM | POA: Insufficient documentation

## 2016-09-04 DIAGNOSIS — I2510 Atherosclerotic heart disease of native coronary artery without angina pectoris: Secondary | ICD-10-CM | POA: Diagnosis not present

## 2016-09-04 DIAGNOSIS — K219 Gastro-esophageal reflux disease without esophagitis: Secondary | ICD-10-CM | POA: Diagnosis not present

## 2016-09-04 DIAGNOSIS — J449 Chronic obstructive pulmonary disease, unspecified: Secondary | ICD-10-CM | POA: Diagnosis not present

## 2016-09-04 DIAGNOSIS — Z419 Encounter for procedure for purposes other than remedying health state, unspecified: Secondary | ICD-10-CM

## 2016-09-04 DIAGNOSIS — M4800 Spinal stenosis, site unspecified: Secondary | ICD-10-CM | POA: Diagnosis present

## 2016-09-04 DIAGNOSIS — Z79899 Other long term (current) drug therapy: Secondary | ICD-10-CM | POA: Diagnosis not present

## 2016-09-04 DIAGNOSIS — M545 Low back pain: Secondary | ICD-10-CM | POA: Diagnosis not present

## 2016-09-04 HISTORY — PX: DECOMPRESSIVE LUMBAR LAMINECTOMY LEVEL 1: SHX5791

## 2016-09-04 HISTORY — PX: LUMBAR LAMINECTOMY/DECOMPRESSION MICRODISCECTOMY: SHX5026

## 2016-09-04 HISTORY — DX: Spinal stenosis, lumbar region without neurogenic claudication: M48.061

## 2016-09-04 LAB — BASIC METABOLIC PANEL
ANION GAP: 10 (ref 5–15)
BUN: 11 mg/dL (ref 6–20)
CHLORIDE: 107 mmol/L (ref 101–111)
CO2: 24 mmol/L (ref 22–32)
Calcium: 9.5 mg/dL (ref 8.9–10.3)
Creatinine, Ser: 0.75 mg/dL (ref 0.61–1.24)
GFR calc non Af Amer: >60 mL/min (ref >60)
Glucose, Bld: 93 mg/dL (ref 65–99)
POTASSIUM: 3.8 mmol/L (ref 3.5–5.1)
SODIUM: 141 mmol/L (ref 135–145)

## 2016-09-04 LAB — CBC
HEMATOCRIT: 44.5 % (ref 39.0–52.0)
HEMOGLOBIN: 14.7 g/dL (ref 13.0–17.0)
MCH: 30.9 pg (ref 26.0–34.0)
MCHC: 33 g/dL (ref 30.0–36.0)
MCV: 93.5 fL (ref 78.0–100.0)
Platelets: 161 10*3/uL (ref 150–400)
RBC: 4.76 MIL/uL (ref 4.22–5.81)
RDW: 13.2 % (ref 11.5–15.5)
WBC: 11.3 10*3/uL — ABNORMAL HIGH (ref 4.0–10.5)

## 2016-09-04 SURGERY — DECOMPRESSIVE LUMBAR LAMINECTOMY LEVEL 1
Anesthesia: General

## 2016-09-04 MED ORDER — PROMETHAZINE HCL 25 MG/ML IJ SOLN
6.2500 mg | INTRAMUSCULAR | Status: DC | PRN
Start: 2016-09-04 — End: 2016-09-04

## 2016-09-04 MED ORDER — MORPHINE SULFATE (PF) 2 MG/ML IV SOLN: 1.0000 mg | INTRAVENOUS | Status: DC | PRN

## 2016-09-04 MED ORDER — LISINOPRIL 10 MG PO TABS
10.0000 mg | ORAL_TABLET | Freq: Every day | ORAL | Status: DC
Start: 2016-09-05 — End: 2016-09-05
  Administered 2016-09-05: 10 mg via ORAL
  Filled 2016-09-04 (×2): qty 1

## 2016-09-04 MED ORDER — FENTANYL CITRATE (PF) 100 MCG/2ML IJ SOLN
INTRAMUSCULAR | Status: AC
  Filled 2016-09-04: qty 2

## 2016-09-04 MED ORDER — PHENOL 1.4 % MT LIQD: 1.0000 | OROMUCOSAL | Status: DC | PRN

## 2016-09-04 MED ORDER — SUGAMMADEX SODIUM 200 MG/2ML IV SOLN
INTRAVENOUS | Status: DC | PRN
  Administered 2016-09-04: 150 mg via INTRAVENOUS

## 2016-09-04 MED ORDER — PHENYLEPHRINE HCL 10 MG/ML IJ SOLN
INTRAMUSCULAR | Status: DC | PRN
  Administered 2016-09-04: 120 ug via INTRAVENOUS
  Administered 2016-09-04: 160 ug via INTRAVENOUS
  Administered 2016-09-04: 120 ug via INTRAVENOUS

## 2016-09-04 MED ORDER — ROCURONIUM BROMIDE 100 MG/10ML IV SOLN
INTRAVENOUS | Status: DC | PRN
  Administered 2016-09-04: 20 mg via INTRAVENOUS
  Administered 2016-09-04: 10 mg via INTRAVENOUS
  Administered 2016-09-04: 50 mg via INTRAVENOUS

## 2016-09-04 MED ORDER — ONDANSETRON HCL 4 MG/2ML IJ SOLN
INTRAMUSCULAR | Status: AC
  Filled 2016-09-04: qty 2

## 2016-09-04 MED ORDER — LACTATED RINGERS IV SOLN
INTRAVENOUS | Status: DC
  Administered 2016-09-05: 02:00:00 via INTRAVENOUS

## 2016-09-04 MED ORDER — LACTATED RINGERS IV SOLN
INTRAVENOUS | Status: DC
  Administered 2016-09-04 (×3): via INTRAVENOUS

## 2016-09-04 MED ORDER — METHOCARBAMOL 500 MG PO TABS
500.0000 mg | ORAL_TABLET | Freq: Four times a day (QID) | ORAL | Status: DC | PRN
  Administered 2016-09-04 – 2016-09-05 (×2): 500 mg via ORAL
  Filled 2016-09-04 (×3): qty 1

## 2016-09-04 MED ORDER — SODIUM CHLORIDE 0.9 % IV SOLN: 250.0000 mL | INTRAVENOUS | Status: DC

## 2016-09-04 MED ORDER — SODIUM CHLORIDE 0.9% FLUSH
3.0000 mL | Freq: Two times a day (BID) | INTRAVENOUS | Status: DC
  Administered 2016-09-04 – 2016-09-05 (×2): 3 mL via INTRAVENOUS

## 2016-09-04 MED ORDER — METHOCARBAMOL 500 MG PO TABS: 500.0000 mg | ORAL_TABLET | Freq: Three times a day (TID) | ORAL | 0 refills | Status: DC | PRN

## 2016-09-04 MED ORDER — ONDANSETRON HCL 4 MG PO TABS: 4.0000 mg | ORAL_TABLET | Freq: Three times a day (TID) | ORAL | 0 refills | Status: DC | PRN

## 2016-09-04 MED ORDER — BUPIVACAINE-EPINEPHRINE (PF) 0.25% -1:200000 IJ SOLN
INTRAMUSCULAR | Status: DC | PRN
  Administered 2016-09-04: 10 mL via PERINEURAL

## 2016-09-04 MED ORDER — FLUTICASONE PROPIONATE 50 MCG/ACT NA SUSP
2.0000 | Freq: Every day | NASAL | Status: DC
  Administered 2016-09-05: 2 via NASAL
  Filled 2016-09-04: qty 16

## 2016-09-04 MED ORDER — SIMVASTATIN 40 MG PO TABS
40.0000 mg | ORAL_TABLET | Freq: Every day | ORAL | Status: DC
  Administered 2016-09-04: 40 mg via ORAL
  Filled 2016-09-04: qty 1

## 2016-09-04 MED ORDER — MENTHOL 3 MG MT LOZG: 1.0000 | LOZENGE | OROMUCOSAL | Status: DC | PRN

## 2016-09-04 MED ORDER — PHENYLEPHRINE HCL 10 MG/ML IJ SOLN
INTRAMUSCULAR | Status: DC | PRN
  Administered 2016-09-04: 50 ug/min via INTRAVENOUS

## 2016-09-04 MED ORDER — PROPOFOL 10 MG/ML IV BOLUS
INTRAVENOUS | Status: AC
  Filled 2016-09-04: qty 20

## 2016-09-04 MED ORDER — CEFAZOLIN SODIUM 1 G IJ SOLR
INTRAMUSCULAR | Status: AC
  Filled 2016-09-04: qty 10

## 2016-09-04 MED ORDER — OXYCODONE HCL 5 MG PO TABS
10.0000 mg | ORAL_TABLET | ORAL | Status: DC | PRN
  Administered 2016-09-04 – 2016-09-05 (×5): 10 mg via ORAL
  Filled 2016-09-04 (×5): qty 2

## 2016-09-04 MED ORDER — SUGAMMADEX SODIUM 200 MG/2ML IV SOLN
INTRAVENOUS | Status: AC
  Filled 2016-09-04: qty 2

## 2016-09-04 MED ORDER — PROPOFOL 10 MG/ML IV BOLUS
INTRAVENOUS | Status: DC | PRN
  Administered 2016-09-04: 50 mg via INTRAVENOUS
  Administered 2016-09-04: 100 mg via INTRAVENOUS

## 2016-09-04 MED ORDER — LIDOCAINE 2% (20 MG/ML) 5 ML SYRINGE
INTRAMUSCULAR | Status: AC
  Filled 2016-09-04: qty 5

## 2016-09-04 MED ORDER — THROMBIN 20000 UNITS EX SOLR
CUTANEOUS | Status: AC
  Filled 2016-09-04: qty 20000

## 2016-09-04 MED ORDER — MEPERIDINE HCL 25 MG/ML IJ SOLN: 6.2500 mg | INTRAMUSCULAR | Status: DC | PRN

## 2016-09-04 MED ORDER — BUPIVACAINE-EPINEPHRINE (PF) 0.25% -1:200000 IJ SOLN
INTRAMUSCULAR | Status: AC
  Filled 2016-09-04: qty 30

## 2016-09-04 MED ORDER — NITROGLYCERIN 0.4 MG SL SUBL: 0.4000 mg | SUBLINGUAL_TABLET | SUBLINGUAL | Status: DC | PRN

## 2016-09-04 MED ORDER — VANCOMYCIN HCL IN DEXTROSE 1-5 GM/200ML-% IV SOLN
1000.0000 mg | Freq: Once | INTRAVENOUS | Status: AC
  Administered 2016-09-05: 1000 mg via INTRAVENOUS
  Filled 2016-09-04: qty 200

## 2016-09-04 MED ORDER — VANCOMYCIN HCL IN DEXTROSE 1-5 GM/200ML-% IV SOLN
1000.0000 mg | INTRAVENOUS | Status: AC
  Administered 2016-09-04: 1000 mg via INTRAVENOUS
  Filled 2016-09-04: qty 200

## 2016-09-04 MED ORDER — METOPROLOL TARTRATE 50 MG PO TABS
50.0000 mg | ORAL_TABLET | Freq: Two times a day (BID) | ORAL | Status: DC
  Administered 2016-09-04 – 2016-09-05 (×2): 50 mg via ORAL
  Filled 2016-09-04 (×2): qty 1

## 2016-09-04 MED ORDER — EPHEDRINE SULFATE 50 MG/ML IJ SOLN
INTRAMUSCULAR | Status: DC | PRN
  Administered 2016-09-04: 10 mg via INTRAVENOUS
  Administered 2016-09-04: 15 mg via INTRAVENOUS

## 2016-09-04 MED ORDER — ONDANSETRON HCL 4 MG/2ML IJ SOLN
INTRAMUSCULAR | Status: DC | PRN
  Administered 2016-09-04: 4 mg via INTRAVENOUS

## 2016-09-04 MED ORDER — HYDROCODONE-ACETAMINOPHEN 10-325 MG PO TABS: 1.0000 | ORAL_TABLET | Freq: Four times a day (QID) | ORAL | 0 refills | Status: DC | PRN

## 2016-09-04 MED ORDER — THROMBIN 20000 UNITS EX SOLR
CUTANEOUS | Status: DC | PRN
  Administered 2016-09-04: 15:00:00 via TOPICAL

## 2016-09-04 MED ORDER — FINASTERIDE 5 MG PO TABS
5.0000 mg | ORAL_TABLET | Freq: Every day | ORAL | Status: DC
  Administered 2016-09-04 – 2016-09-05 (×2): 5 mg via ORAL
  Filled 2016-09-04 (×2): qty 1

## 2016-09-04 MED ORDER — LIDOCAINE HCL (CARDIAC) 20 MG/ML IV SOLN
INTRAVENOUS | Status: DC | PRN
  Administered 2016-09-04: 20 mg via INTRAVENOUS

## 2016-09-04 MED ORDER — HEMOSTATIC AGENTS (NO CHARGE) OPTIME
TOPICAL | Status: DC | PRN
  Administered 2016-09-04: 1 via TOPICAL

## 2016-09-04 MED ORDER — ONDANSETRON HCL 4 MG/2ML IJ SOLN: 4.0000 mg | INTRAMUSCULAR | Status: DC | PRN

## 2016-09-04 MED ORDER — GLYCOPYRROLATE 0.2 MG/ML IJ SOLN
INTRAMUSCULAR | Status: DC | PRN
  Administered 2016-09-04: .4 mg via INTRAVENOUS
  Administered 2016-09-04: .2 mg via INTRAVENOUS

## 2016-09-04 MED ORDER — MIDAZOLAM HCL 2 MG/2ML IJ SOLN: 0.5000 mg | Freq: Once | INTRAMUSCULAR | Status: DC | PRN

## 2016-09-04 MED ORDER — FENTANYL CITRATE (PF) 100 MCG/2ML IJ SOLN
INTRAMUSCULAR | Status: DC | PRN
  Administered 2016-09-04 (×5): 50 ug via INTRAVENOUS

## 2016-09-04 MED ORDER — ROCURONIUM BROMIDE 10 MG/ML (PF) SYRINGE
PREFILLED_SYRINGE | INTRAVENOUS | Status: AC
  Filled 2016-09-04: qty 10

## 2016-09-04 MED ORDER — HYDROMORPHONE HCL 1 MG/ML IJ SOLN: 0.2500 mg | INTRAMUSCULAR | Status: DC | PRN

## 2016-09-04 MED ORDER — TAMSULOSIN HCL 0.4 MG PO CAPS
0.4000 mg | ORAL_CAPSULE | Freq: Two times a day (BID) | ORAL | Status: DC
  Administered 2016-09-04 – 2016-09-05 (×2): 0.4 mg via ORAL
  Filled 2016-09-04 (×2): qty 1

## 2016-09-04 MED ORDER — SODIUM CHLORIDE 0.9% FLUSH: 3.0000 mL | INTRAVENOUS | Status: DC | PRN

## 2016-09-04 MED ORDER — METHOCARBAMOL 1000 MG/10ML IJ SOLN
500.0000 mg | Freq: Four times a day (QID) | INTRAMUSCULAR | Status: DC | PRN
  Filled 2016-09-04: qty 5

## 2016-09-04 SURGICAL SUPPLY — 54 items
BNDG GAUZE ELAST 4 BULKY (GAUZE/BANDAGES/DRESSINGS) ×2 IMPLANT
CATH SUCT ARGYLE CHIMNEY 10FR (CATHETERS) IMPLANT
CLSR STERI-STRIP ANTIMIC 1/2X4 (GAUZE/BANDAGES/DRESSINGS) ×1 IMPLANT
CORDS BIPOLAR (ELECTRODE) ×2 IMPLANT
COVER SURGICAL LIGHT HANDLE (MISCELLANEOUS) ×2 IMPLANT
DRAPE POUCH INSTRU U-SHP 10X18 (DRAPES) ×2 IMPLANT
DRAPE SURG 17X11 SM STRL (DRAPES) ×2 IMPLANT
DRAPE U-SHAPE 47X51 STRL (DRAPES) ×2 IMPLANT
DRSG AQUACEL AG ADV 3.5X 4 (GAUZE/BANDAGES/DRESSINGS) ×1 IMPLANT
DRSG AQUACEL AG ADV 3.5X 6 (GAUZE/BANDAGES/DRESSINGS) ×1 IMPLANT
DURAPREP 26ML APPLICATOR (WOUND CARE) ×2 IMPLANT
ELECT BLADE 4.0 EZ CLEAN MEGAD (MISCELLANEOUS) ×2
ELECT PENCIL ROCKER SW 15FT (MISCELLANEOUS) ×2 IMPLANT
ELECT REM PT RETURN 9FT ADLT (ELECTROSURGICAL) ×2
ELECTRODE BLDE 4.0 EZ CLN MEGD (MISCELLANEOUS) ×1 IMPLANT
ELECTRODE REM PT RTRN 9FT ADLT (ELECTROSURGICAL) ×1 IMPLANT
GLOVE BIO SURGEON STRL SZ 6.5 (GLOVE) ×2 IMPLANT
GLOVE BIOGEL PI IND STRL 6.5 (GLOVE) ×1 IMPLANT
GLOVE BIOGEL PI IND STRL 8.5 (GLOVE) ×1 IMPLANT
GLOVE BIOGEL PI INDICATOR 6.5 (GLOVE) ×1
GLOVE BIOGEL PI INDICATOR 8.5 (GLOVE) ×1
GLOVE SS BIOGEL STRL SZ 8.5 (GLOVE) ×1 IMPLANT
GLOVE SUPERSENSE BIOGEL SZ 8.5 (GLOVE) ×1
GOWN STRL REUS W/ TWL LRG LVL3 (GOWN DISPOSABLE) ×1 IMPLANT
GOWN STRL REUS W/TWL 2XL LVL3 (GOWN DISPOSABLE) ×4 IMPLANT
GOWN STRL REUS W/TWL LRG LVL3 (GOWN DISPOSABLE) ×2
KIT BASIN OR (CUSTOM PROCEDURE TRAY) ×2 IMPLANT
KIT SUCTION CATH 10FR (CATHETERS) ×1
NDL SPNL 18GX3.5 QUINCKE PK (NEEDLE) ×2 IMPLANT
NEEDLE 22X1 1/2 (OR ONLY) (NEEDLE) ×2 IMPLANT
NEEDLE SPNL 18GX3.5 QUINCKE PK (NEEDLE) ×4 IMPLANT
NS IRRIG 1000ML POUR BTL (IV SOLUTION) ×2 IMPLANT
PACK LAMINECTOMY ORTHO (CUSTOM PROCEDURE TRAY) ×2 IMPLANT
PACK UNIVERSAL I (CUSTOM PROCEDURE TRAY) ×2 IMPLANT
PATTIES SURGICAL .5 X.5 (GAUZE/BANDAGES/DRESSINGS) IMPLANT
PATTIES SURGICAL .5 X1 (DISPOSABLE) ×2 IMPLANT
SPONGE LAP 4X18 X RAY DECT (DISPOSABLE) IMPLANT
SPONGE SURGIFOAM ABS GEL 100 (HEMOSTASIS) ×2 IMPLANT
SURGIFLO W/THROMBIN 8M KIT (HEMOSTASIS) ×1 IMPLANT
SUT BONE WAX W31G (SUTURE) ×2 IMPLANT
SUT MON AB 3-0 SH 27 (SUTURE) ×2
SUT MON AB 3-0 SH27 (SUTURE) ×1 IMPLANT
SUT VIC AB 1 CT1 18XCR BRD 8 (SUTURE) IMPLANT
SUT VIC AB 1 CT1 27 (SUTURE) ×4
SUT VIC AB 1 CT1 27XBRD ANTBC (SUTURE) ×2 IMPLANT
SUT VIC AB 1 CT1 8-18 (SUTURE) ×2
SUT VIC AB 2-0 CT1 18 (SUTURE) ×4 IMPLANT
SUT VICRYL 0 UR6 27IN ABS (SUTURE) ×2 IMPLANT
SYR BULB IRRIGATION 50ML (SYRINGE) ×2 IMPLANT
SYR CONTROL 10ML LL (SYRINGE) ×2 IMPLANT
TOWEL OR 17X26 10 PK STRL BLUE (TOWEL DISPOSABLE) ×4 IMPLANT
TRAY FOLEY CATH 16FRSI W/METER (SET/KITS/TRAYS/PACK) IMPLANT
WATER STERILE IRR 1000ML POUR (IV SOLUTION) ×1 IMPLANT
YANKAUER SUCT BULB TIP NO VENT (SUCTIONS) ×3 IMPLANT

## 2016-09-04 NOTE — OR Nursing (Signed)
Dr. Randel Pigg called stating that the markers are at the L3-4 space. Information relayed to Dr. Rolena Infante

## 2016-09-04 NOTE — H&P (Addendum)
History of Present Illness  The patient is a 75 year old male who comes in today for a preoperative History and Physical. The patient is scheduled for a decompression L3-L4 to be performed by Dr. Duane Lope D. Rolena Infante, MD at [facility] on 09/04/16 . Please see the hospital record for complete dictated history and physical. 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 He continues to be treated on ASA 81 mg. Pt currently smokes. He reports smoking a pack every 2-3 days.   Allergies  PENICILLIN [11/10/2000]: Allergies Reconciled   Family History  Cancer  grandfather fathers side Diabetes Mellitus  brother Heart Disease  brother Hypertension  brother and child  Social History Number of flights of stairs before winded  2-3 Marital status  married Living situation  live with spouse Tobacco use  current every day smoker; smoke(d) less than 1/2 pack(s) per day Tobacco / smoke exposure  no Pain Contract  no Illicit drug use  no Current work status  retired Children  2 Alcohol use  never consumed alcohol Exercise  Exercises never Drug/Alcohol Rehab (Previously)  no Drug/Alcohol Rehab (Currently)  no  Medication History  TraMADol HCl (50MG  Tablet, 2 (two) Oral po q 6 hrs prn pain, Taken starting 07/22/2016) Active. Metoprolol Tartrate (50MG  Tablet, 2 Oral daily) Active. Lisinopril (10MG  Tablet, 1 Oral daily) Active. Simvastatin (40MG  Tablet, 1 Oral daily) Active. Diazepam (10MG  Tablet, Oral) Active. Fluticasone Propionate (50MCG/ACT Suspension, Nasal) Active. Nitrostat (0.4MG  Tab Sublingual, Sublingual) Active. Finasteride (5MG  Tablet, Oral) Active. Stool Softener (100MG  Capsule, Oral) Active. NexIUM (20MG  Capsule DR, Oral) Active. Centrum Silver (1 Oral daily) Active. Tamsulosin HCl (0.4MG  Capsule, 1 Oral daily) Active. Aspirin (81MG  Tablet, 1 Oral daily) Active. 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 Orientation-Oriented X3. Build & Nutrition-Well nourished and Well developed.  Integumentary General Characteristics Surgical Scars - surgical scarring consistent with previous right hip surgery, no surgical scar evidence of previous lumbar surgery. Lumbar Spine-Skin examination of the lumbar spine is without deformity, skin lesions, lacerations or abrasions.  Chest and Lung Exam Auscultation Breath sounds - Normal and Clear.  Cardiovascular Auscultation Rhythm - Regular rate and rhythm.  Abdomen Palpation/Percussion Palpation and Percussion of the abdomen reveal - Soft, Non Tender and No Rebound tenderness.  Peripheral Vascular Lower Extremity Palpation - Posterior tibial pulse - Bilateral - 2+. Dorsalis pedis pulse - Bilateral - 2+.  Neurologic Sensation Lower Extremity - Bilateral - sensation is intact in the lower extremity and sensation is diminished in the lower extremity. Reflexes Patellar Reflex - Bilateral - 2+. Achilles Reflex - Bilateral - 2+. Clonus - Bilateral - clonus not present. Hoffman's Sign - Bilateral - Hoffman's sign not present. Testing Seated Straight Leg Raise - Bilateral - Seated straight leg raise  negative.  Musculoskeletal Spine/Ribs/Pelvis  Lumbosacral Spine: Inspection and Palpation - Tenderness - left lumbar paraspinals tender to palpation  and right lumbar paraspinals tender to palpation. Strength and Tone: Strength - Hip Flexion - Bilateral - 4/5. Knee Extension - Bilateral - 5/5. Knee Flexion - Bilateral - 4/5. Ankle Dorsiflexion - Bilateral - 4/5. Ankle Plantarflexion - Bilateral - 5/5. Heel walk - Bilateral - unable to heel walk. Toe Walk - Bilateral - unable to walk on toes. ROM - Flexion - moderately decreased range of motion and painful. Extension - moderately decreased range of motion and painful. Left Lateral Bending - moderately decreased range of motion and painful. Right Lateral Bending - moderately decreased range of motion and painful. Right Rotation - moderately decreased range of motion and painful. Left Rotation - moderately decreased range of motion and painful. Pain - . Lumbosacral Spine - Waddell's Signs - no Waddell's signs present. Lower Extremity Range of Motion - No true hip, knee or ankle pain with range of motion. Gait and Station - [employer] - cane.   Assessment & Plan   Posterior Lumbar Decompression/disectomy: Risks of surgery include infection, bleeding, nerve damage, death, stroke, paralysis, failure to heal, need for further surgery, ongoing or worse pain, need for further surgery, CSF leak, loss of bowel or bladder, and recurrent disc herniation or Stenosis which would necessitate need for further surgery. Goal Of Surgery: Discussed that goal of surgery is to reduce pain and improve function and quality of life. Patient is aware that despite all appropriate treatment that there pain and function could be the same, worse, or different.  His MRI from 06/02/2016 was reviewed. 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 Mild-to-moderate degenerative changes at 2-3, 5-1, 4-5, hemangioma in the body of L3. 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 At this point in time, we had a long discussion about treatment. He has had injection therapy in the past and he has had physical therapy and he continues to suffer. At this point, he is interested in a surgical solution. We reviewed the risks which include infection, bleeding, nerve damage, death, stroke, paralysis, failure to heal, migration of the implant, need for further surgery, ongoing or worst pain. Before moving forward with surgery I do want the vascular evaluation of the aneurysm to be completed. He is going to talk to his regular doctor about the cysts and he will contact me as soon as that has been done. If no treatment is required for either of these problems, then we will move forward with the surgery for his back.

## 2016-09-04 NOTE — Anesthesia Postprocedure Evaluation (Signed)
Anesthesia Post Note  Patient: [name] [name]  Procedure(s) Performed: Procedure(s) (LRB): DECOMPRESSION L3 - L4 (N/A)  Patient location during evaluation: PACU Anesthesia Type: General Level of consciousness: awake and alert and patient cooperative Pain management: pain level controlled Vital Signs Assessment: post-procedure vital signs reviewed and stable Respiratory status: spontaneous breathing and respiratory function stable Cardiovascular status: stable Anesthetic complications: no    Last Vitals:  Vitals:   09/04/16 1748 09/04/16 1803  BP: 110/68 (!) 105/57  Pulse: 81 70  Resp: 14 13  Temp:      Last Pain:  Vitals:   09/04/16 1750  TempSrc:   PainSc: 0-No pain                 Dena Esperanza S

## 2016-09-04 NOTE — Anesthesia Procedure Notes (Signed)
Procedure Name: Intubation Date/Time: 09/04/2016 3:00 PM Performed by: Salli Quarry Arcadio Cope Pre-anesthesia Checklist: Patient identified, Emergency Drugs available, Suction available and Patient being monitored Patient Re-evaluated:Patient Re-evaluated prior to inductionOxygen Delivery Method: Circle System Utilized Preoxygenation: Pre-oxygenation with 100% oxygen Intubation Type: IV induction Ventilation: Mask ventilation without difficulty Laryngoscope Size: Mac and 3 Grade View: Grade I Tube type: Oral Tube size: 7.5 mm Number of attempts: 1 Airway Equipment and Method: Stylet Placement Confirmation: ETT inserted through vocal cords under direct vision,  positive ETCO2 and breath sounds checked- equal and bilateral Secured at: 23 cm Tube secured with: Tape Dental Injury: Teeth and Oropharynx as per pre-operative assessment

## 2016-09-04 NOTE — Progress Notes (Signed)
Pharmacy Antibiotic Note  [name] [name] is a 75 y.o. male admitted on 09/04/2016 with surgical prophylaxis.  Pharmacy has been consulted for vancomycin dosing x 1 dose post-op.  Patient does not have a drain, verified with op note.  Received pre-op vancomycin dose at 1455 PM.  Plan: Vancomycin 1g x 1 at 0300 on 9/29. Pharmacy will sign-off, please contact if questions.  Thanks!  Height: 5\' 10"  (177.8 cm) Weight: 170 lb (77.1 kg) IBW/kg (Calculated) : 73  Temp (24hrs), Avg:97.7 F (36.5 C), Min:97.7 F (36.5 C), Max:97.7 F (36.5 C)   Recent Labs Lab 09/04/16 1122  WBC 11.3*  CREATININE 0.75    Estimated Creatinine Clearance: 81.1 mL/min (by C-G formula based on SCr of 0.75 mg/dL).    Allergies  Allergen Reactions  . Penicillins Other (See Comments)    Has patient had a PCN reaction causing immediate rash, facial/tongue/throat swelling, SOB or lightheadedness with hypotension: Yes Has patient had a PCN reaction causing severe rash involving mucus membranes or skin necrosis: No Has patient had a PCN reaction that required hospitalization No Has patient had a PCN reaction occurring within the last 10 years: No If all of the above answers are "NO", then may proceed with Cephalosporin use.  Passed out     Thank you for allowing pharmacy to be a part of this patient's care.  Uvaldo Rising, BCPS  Clinical Pharmacist Pager [phone]  09/04/2016 7:05 PM

## 2016-09-04 NOTE — Transfer of Care (Signed)
Immediate Anesthesia Transfer of Care Note  Patient: [name] [name]  Procedure(s) Performed: Procedure(s): DECOMPRESSION L3 - L4 (N/A)  Patient Location: PACU  Anesthesia Type:General  Level of Consciousness: awake, alert , oriented and patient cooperative  Airway & Oxygen Therapy: Patient Spontanous Breathing and Patient connected to nasal cannula oxygen  Post-op Assessment: Report given to RN and Post -op Vital signs reviewed and stable  Post vital signs: Reviewed and stable  Last Vitals:  Vitals:   09/04/16 1718 09/04/16 1719  BP:  103/77  Pulse:  92  Resp:  11  Temp: 36.5 C     Last Pain:  Vitals:   09/04/16 1718  TempSrc:   PainSc: 5       Patients Stated Pain Goal: 5 ([id number])  Complications: No apparent anesthesia complications

## 2016-09-04 NOTE — Brief Op Note (Signed)
09/04/2016  4:56 PM  PATIENT:  [name] [name]  75 y.o. 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

## 2016-09-04 NOTE — Progress Notes (Signed)
Pt admitted to room from pacu; pt A&O x4; IV intact and transfusing; VSS; pt oriented to the unit and room; family at bedside; fall safety precaution / prevention education completed with pt; Bed alarm on; back incision dsg has clean, dry and intact aquacel dsg. No pressure ulcer or other wounds noted. Will closely monitor. Call light within reach. Delia Heady RN

## 2016-09-04 NOTE — Anesthesia Preprocedure Evaluation (Addendum)
Anesthesia Evaluation  Patient identified by MRN, date of birth, ID band Patient awake    Reviewed: Allergy & Precautions, NPO status , Patient's Chart, lab work & pertinent test results, reviewed documented beta blocker date and time   History of Anesthesia Complications Negative for: history of anesthetic complications  Airway Mallampati: II  TM Distance: >3 FB Neck ROM: Full    Dental  (+) Poor Dentition, Missing, Dental Advisory Given, Chipped   Pulmonary COPD, Current Smoker,    breath sounds clear to auscultation       Cardiovascular hypertension, Pt. on home beta blockers and Pt. on medications (-) angina+ CAD, + Past MI, + CABG and + Peripheral Vascular Disease (4 cm)   Rhythm:Regular Rate:Normal  '11 stress: no ischemia. EF 55%.   Neuro/Psych negative neurological ROS     GI/Hepatic Neg liver ROS, GERD  Controlled,  Endo/Other  negative endocrine ROS  Renal/GU negative Renal ROS     Musculoskeletal  (+) Arthritis , Osteoarthritis,    Abdominal   Peds  Hematology  (+) Blood dyscrasia (thrombocytopenia, plt 161k), ,   Anesthesia Other Findings   Reproductive/Obstetrics                            Anesthesia Physical Anesthesia Plan  ASA: III  Anesthesia Plan: General   Post-op Pain Management:    Induction: Intravenous  Airway Management Planned: Oral ETT  Additional Equipment:   Intra-op Plan:   Post-operative Plan: Extubation in OR  Informed Consent: I have reviewed the patients History and Physical, chart, labs and discussed the procedure including the risks, benefits and alternatives for the proposed anesthesia with the patient or authorized representative who has indicated his/her understanding and acceptance.   Dental advisory given  Plan Discussed with: CRNA and Surgeon  Anesthesia Plan Comments: (Plan routine monitors, GETA)        Anesthesia Quick  Evaluation

## 2016-09-05 ENCOUNTER — Encounter (HOSPITAL_COMMUNITY): Payer: Self-pay | Admitting: Orthopedic Surgery

## 2016-09-05 DIAGNOSIS — M4806 Spinal stenosis, lumbar region: Secondary | ICD-10-CM | POA: Diagnosis not present

## 2016-09-05 NOTE — Progress Notes (Signed)
Pt discharge education and instructions completed with pt and family at bedside; all voices understanding and denies any questions. Pt IV removed; back incision dsg remains clean, dry and intact with no stain or active bleeding noted. Pt discharge home with family to transport him home. Pt handed his prescriptions for robaxin, norco and zofran. Pt transported off unit via wheelchair with belongings and family to the side. Delia Heady RN

## 2016-09-05 NOTE — Evaluation (Signed)
Occupational Therapy Evaluation Patient Details Name: [name] [name] MRN: [id number] DOB: 10/20/1941 Today's Date: 09/05/2016    History of Present Illness Patient is a 75 y/o male with hx of MI, HTN, CABG, CAD and AAA presents s/p L3-4 decompression.   Clinical Impression   Pt reports he was independent with ADL PTA. Currently pt overall min assist for functional mobility and required max assist for donning back brace. Began back, safety, and ADL education with pt but session limited due to arrival of lunch tray. Pt planning to d/c home with 24/7 supervision. Feel pt would benefit from another night stay in hospital prior to d/c home. Recommending HHOT for follow up to maximize independence and safety with ADL and functional mobility upon d/c home. Pt would benefit from continued skilled OT to address established goals.    Follow Up Recommendations  Home health OT;Supervision/Assistance - 24 hour    Equipment Recommendations  None recommended by OT    Recommendations for Other Services       Precautions / Restrictions Precautions Precautions: Back Precaution Booklet Issued: No Precaution Comments: Pt able to recall 3/3 precautions. Reviewed precautions during functional activities. Required Braces or Orthoses: Spinal Brace Spinal Brace: Lumbar corset;Applied in sitting position Restrictions Weight Bearing Restrictions: No      Mobility Bed Mobility Overal bed mobility: Needs Assistance Bed Mobility: Rolling;Sidelying to Sit Rolling: Supervision Sidelying to sit: Min guard     Sit to sidelying: Mod assist General bed mobility comments: No physical assist required. Close gurad for elevation of trunk. HOB flat with use of bed rails.  Transfers Overall transfer level: Needs assistance Equipment used: Rolling walker (2 wheeled) Transfers: Sit to/from Stand Sit to Stand: Min guard         General transfer comment: Min guard for safety. Sit to stand from EOB x1.  VCs for hand placement.    Balance Overall balance assessment: Needs assistance Sitting-balance support: Feet supported;No upper extremity supported Sitting balance-Leahy Scale: Fair     Standing balance support: No upper extremity supported;During functional activity Standing balance-Leahy Scale: Fair Standing balance comment: Reliant on BUEs for support.                            ADL Overall ADL's : Needs assistance/impaired Eating/Feeding: Set up;Sitting   Grooming: Minimal assistance;Standing;Wash/dry hands Grooming Details (indicate cue type and reason): Min assist for balance Upper Body Bathing: Set up;Supervision/ safety;Sitting       Upper Body Dressing : Maximal assistance;Sitting Upper Body Dressing Details (indicate cue type and reason): to don back brace     Toilet Transfer: Minimal assistance;Ambulation;BSC;RW Toilet Transfer Details (indicate cue type and reason): Simulated by sit to stand from EOB with functional mobility in room.         Functional mobility during ADLs: Minimal assistance;Rolling walker General ADL Comments: Educated pt on back precautions and log roll technique. OT eval limited by arrival of lunch tray.     Vision Vision Assessment?: No apparent visual deficits   Perception     Praxis      Pertinent Vitals/Pain Pain Assessment: Faces Faces Pain Scale: Hurts even more Pain Location: back with movement Pain Descriptors / Indicators: Aching Pain Intervention(s): Monitored during session;Repositioned     Hand Dominance     Extremity/Trunk Assessment Upper Extremity Assessment Upper Extremity Assessment: Overall WFL for tasks assessed   Lower Extremity Assessment Lower Extremity Assessment: Defer to PT evaluation  Cervical / Trunk Assessment Cervical / Trunk Assessment: Other exceptions Cervical / Trunk Exceptions: s/p spine surgery.   Communication Communication Communication: No difficulties   Cognition  Arousal/Alertness: Awake/alert Behavior During Therapy: WFL for tasks assessed/performed Overall Cognitive Status: Within Functional Limits for tasks assessed                     General Comments       Exercises       Shoulder Instructions      Home Living Family/patient expects to be discharged to:: Private residence Living Arrangements: Spouse/significant other Available Help at Discharge: Family;Available 24 hours/day Type of Home: House Home Access: Stairs to enter [employer] of Steps: 2 Entrance Stairs-Rails: None Home Layout: One level     Bathroom Shower/Tub: Tub/shower unit Shower/tub characteristics: Curtain Biochemist, clinical: Handicapped height     Home Equipment: Environmental consultant - 2 wheels;Cane - single point;Bedside commode;Tub bench          Prior Functioning/Environment Level of Independence: Independent with assistive device(s)        Comments: Pt used SPC PTA. Drives.         OT Problem List: Decreased strength;Impaired balance (sitting and/or standing);Decreased knowledge of use of DME or AE;Decreased knowledge of precautions;Pain   OT Treatment/Interventions: Self-care/ADL training;Energy conservation;DME and/or AE instruction;Therapeutic activities;Patient/family education;Balance training    OT Goals(Current goals can be found in the care plan section) Acute Rehab OT Goals Patient Stated Goal: go home OT Goal Formulation: With patient/family Time For Goal Achievement: 09/19/16 Potential to Achieve Goals: Good ADL Goals Pt Will Perform Lower Body Bathing: with supervision;sit to/from stand (with or without AE) Pt Will Perform Lower Body Dressing: with supervision;sit to/from stand (with or without AE) Pt Will Transfer to Toilet: with supervision;ambulating;bedside commode Pt Will Perform Toileting - Clothing Manipulation and hygiene: with supervision;sit to/from stand Pt Will Perform Tub/Shower Transfer: with supervision;Tub  transfer;ambulating;shower seat;rolling walker Additional ADL Goal #1: Pt will independently verbally recall 3/3 back precautions and maintain throughout ADL and functional mobility. Additional ADL Goal #2: Pt will don/doff back brace with set up as precursor to ADL and functional mobility.  OT Frequency: Min 2X/week   Barriers to D/C:            Co-evaluation              End of Session Equipment Utilized During Treatment: Rolling walker;Back brace Nurse Communication: Mobility status  Activity Tolerance: Patient tolerated treatment well Patient left: in chair;with call bell/phone within reach;with family/visitor present   Time: 1133-1150 OT Time Calculation (min): 17 min Charges:  OT General Charges $OT Visit: 1 Procedure OT Evaluation $OT Eval Moderate Complexity: 1 Procedure G-Codes: OT G-codes **NOT FOR INPATIENT CLASS** Functional Assessment Tool Used: Clinical judgement Functional Limitation: Self care Self Care Current Status [id number]): At least 20 percent but less than 40 percent impaired, limited or restricted Self Care Goal Status [id number]): At least 1 percent but less than 20 percent impaired, limited or restricted   Binnie Kand M.S., OTR/L Pager: [phone]  09/05/2016, 12:07 PM

## 2016-09-05 NOTE — Op Note (Signed)
[name] [name], [name] [name]              ACCOUNT NO.:  [id number]  MEDICAL RECORD NO.:  [id number]  LOCATION:                                 FACILITY:  PHYSICIAN:  Eura Radabaugh D. Rolena Infante, M.D. DATE OF BIRTH:  03-29-1941  DATE OF PROCEDURE:  09/04/2016 DATE OF DISCHARGE:                              OPERATIVE REPORT   PREOPERATIVE DIAGNOSIS:  Lumbar spinal stenosis with neurogenic claudication.  POSTOPERATIVE DIAGNOSIS:  Lumbar spinal stenosis with neurogenic claudication.  OPERATIVE PROCEDURE:  Lumbar decompression at L3-4.  COMPLICATIONS:  None.  CONDITION:  Stable.  HISTORY:  This is a very pleasant 75 year old gentleman who has been complaining of severe back, buttock, and bilateral leg pain, left side predominantly worse than the right.  Attempts at conservative management have failed.  His MRI and clinical exam were consistent with spinal stenosis at the L3-4 level with neurogenic claudication.  As a result of the failure of conservative measures, we elected to proceed with surgery.  All appropriate risks, benefits, and alternatives were discussed with the patient and consent was obtained.  OPERATIVE NOTE:  The patient was brought to the operating room and placed supine on the operating room table.  After successful induction of general anesthesia and endotracheal intubation, TEDs and SCDs were applied and the back was prepped and draped in a standard fashion.  Time- out was taken confirming patient, procedure, and all other pertinent important data.  Once this was done, two needles were placed in the back and x-ray was taken to localize the incision site.  The midline incision was infiltrated with 0.25% Marcaine and the scalpel used to incise the skin.  Sharp dissection was carried out down to the deep fascia.  Using Bovie and Cobb, I stripped the paraspinal muscles to expose the L3 spinous process and lamina as well as the portion of the L4.  Self- retaining retractors were  placed.  Hemostasis was obtained using bipolar electrocautery.  A Penfield 4 was placed underneath the L3 lamina and an x-ray was taken confirming I was at the appropriate level.  Once this was done, a double-action Leksell rongeur was used to remove the inferior two-thirds of the L3 spinous process and the superior one-third of the L4 spinous process.  There was significant thickening of the ligamentum flavum with buckling due to collapse.  A laminar spreader was used to distract the L3-4 space, which allowed me now to develop a plane underneath the L3 lamina.  A generous laminotomy was performed with a 3 mm Kerrison rongeur.  I then gently dissected through the ligamentum flavum with a Penfield 4 creating a plane between the thickened ligamentum flavum and the thecal sac.  Once this was done, I was able to use a 3 and 2 mm Kerrison punch to resect the ligamentum flavum and complete my central decompression.  I then went into the lateral recess and removed the ligamentum flavum to decompress the nerve there.  I then traced the L4 nerve root down to the foramen and made sure my decompression was as far wide as the medial border of the pedicle.  Once this was done bilaterally, I then used my Surveyor, quantity to confirm my  decompression.  I could palpate the medial walls of both pedicles.  I could easily slide my Woodson elevator superiorly in the lateral recess, centrally underneath the annulus into the lateral recess and L4 foramen. At this point, I was pleased with the decompression.  I used my bipolar electrocautery and Floseal to obtain and maintain hemostasis.  I irrigated the wound copiously with normal saline.  We then closed in a layered fashion with #1 Vicryl suture, 2-0 Vicryl suture, and a 3-0 Monocryl.  Steri-Strips and a dry dressing were applied.  First assistant for the case was [facility], my PA.  The patient was ultimately transferred to the PACU without  incident.     Annjanette Wertenberger D. Rolena Infante, M.D.   ______________________________ Blake Divine. Rolena Infante, M.D.    DDB/MEDQ  D:  09/04/2016  T:  09/05/2016  Job:  [id number]

## 2016-09-05 NOTE — Progress Notes (Signed)
    Subjective: Procedure(s) (LRB): DECOMPRESSION L3 - L4 (N/A) 1 Day Post-Op  Patient reports pain as 2 on 0-10 scale.  Reports decreased leg pain reports incisional back pain   Positive void Negative bowel movement Positive flatus Negative chest pain or shortness of breath  Objective: Vital signs in last 24 hours: Temp:  [97.7 F (36.5 C)-99.9 F (37.7 C)] 99.9 F (37.7 C) (09/29 0450) Pulse Rate:  [67-92] 88 (09/29 0450) Resp:  [8-20] 18 (09/29 0450) BP: (103-151)/(57-92) 111/68 (09/29 0450) SpO2:  [94 %-100 %] 94 % (09/29 0450) Weight:  [77.1 kg (170 lb)] 77.1 kg (170 lb) (09/28 1112)  Intake/Output from previous day: 09/28 0701 - 09/29 0700 In: 2000 [I.V.:2000] Out: 1300 [Urine:1200; Blood:100]  Labs:  Recent Labs  09/04/16 1122  WBC 11.3*  RBC 4.76  HCT 44.5  PLT 161    Recent Labs  09/04/16 1122  NA 141  K 3.8  CL 107  CO2 24  BUN 11  CREATININE 0.75  GLUCOSE 93  CALCIUM 9.5   No results for input(s): LABPT, INR in the last 72 hours.  Physical Exam: Neurologically intact ABD soft Sensation intact distally Intact pulses distally Incision: dressing C/D/I Compartment soft  Assessment/Plan: Patient stable  xrays n/a Continue mobilization with physical therapy Continue care  Advance diet Up with therapy  Mobilization with PT Possible d/c today of cleared by PT  Melina Schools, MD [place] [phone]

## 2016-09-05 NOTE — Evaluation (Signed)
Physical Therapy Evaluation Patient Details Name: [name] [name] MRN: [id number] DOB: [birth year] Today's Date: 09/05/2016   History of Present Illness  Patient is a 75 y/o male with hx of MI, HTN, CABG, CAD and AAA presents s/p L3-4 decompression.  Clinical Impression  Patient presents with pain and post surgical deficits s/p above surgery. Tolerated short distance ambulation with Min A for balance/safety. Mobility limited due to pain. Education re: back precautions, positioning, brace, activity etc. Pt will have support from wife at home. Needs to negotiate 2 steps to get into home. Will follow acutely to maximize independence and mobility prior to return home.     Follow Up Recommendations Home health PT;Supervision/Assistance - 24 hour;Supervision for mobility/OOB    Equipment Recommendations  None recommended by PT    Recommendations for Other Services       Precautions / Restrictions Precautions Precautions: Back Precaution Booklet Issued: No Precaution Comments: Reviewed back precautions. Required Braces or Orthoses: Spinal Brace Spinal Brace: Lumbar corset;Applied in sitting position Restrictions Weight Bearing Restrictions: No      Mobility  Bed Mobility Overal bed mobility: Needs Assistance Bed Mobility: Sit to Sidelying;Rolling Rolling: Min guard       Sit to sidelying: Mod assist General bed mobility comments: HOB flat, no use of rails to simulate home. Assist to bring BLEs into bed. Cues for log roll technique.  Transfers Overall transfer level: Needs assistance Equipment used: Rolling walker (2 wheeled) Transfers: Sit to/from Stand Sit to Stand: Min guard         General transfer comment: Min guard to steady in standing. Difficulty obtaining upright due to sharp pain.  Ambulation/Gait Ambulation/Gait assistance: Min assist Ambulation Distance (Feet): 18 Feet Assistive device: Rolling walker (2 wheeled) Gait Pattern/deviations: Step-through  pattern;Decreased stride length;Trunk flexed Gait velocity: decreased   General Gait Details: Slow, unsteady gait with left knee instability. Cues for uright posture. Limited by pain.   Stairs            Wheelchair Mobility    Modified Rankin (Stroke Patients Only)       Balance Overall balance assessment: Needs assistance Sitting-balance support: Feet supported;No upper extremity supported Sitting balance-Leahy Scale: Fair     Standing balance support: During functional activity Standing balance-Leahy Scale: Poor Standing balance comment: Reliant on BUEs for support.                             Pertinent Vitals/Pain Pain Assessment: Faces Faces Pain Scale: Hurts worst Pain Location: back  Pain Descriptors / Indicators: Sore;Operative site guarding;Grimacing;Guarding;Sharp Pain Intervention(s): Monitored during session;Repositioned;RN gave pain meds during session;Limited activity within patient's tolerance    Home Living Family/patient expects to be discharged to:: Private residence Living Arrangements: Spouse/significant other Available Help at Discharge: Family;Available 24 hours/day Type of Home: House Home Access: Stairs to enter Entrance Stairs-Rails: None Entrance Stairs-Number of Steps: 2 Home Layout: One level Home Equipment: Walker - 2 wheels;Cane - single point;Bedside commode;Tub bench      Prior Function Level of Independence: Independent with assistive device(s)         Comments: Pt used SPC PTA. Drives.      Hand Dominance        Extremity/Trunk Assessment   Upper Extremity Assessment: Defer to OT evaluation           Lower Extremity Assessment: Generalized weakness (LLE functionally weaker than RLE.)      Cervical / Trunk Assessment: Other  exceptions  Communication   Communication: No difficulties  Cognition Arousal/Alertness: Awake/alert Behavior During Therapy: WFL for tasks assessed/performed Overall  Cognitive Status: Within Functional Limits for tasks assessed                      General Comments General comments (skin integrity, edema, etc.): Spouse present during session.    Exercises     Assessment/Plan    PT Assessment Patient needs continued PT services  PT Problem List Decreased strength;Decreased mobility;Decreased knowledge of precautions;Decreased activity tolerance;Pain;Decreased balance          PT Treatment Interventions DME instruction;Therapeutic activities;Therapeutic exercise;Patient/family education;Balance training;Stair training;Gait training;Functional mobility training    PT Goals (Current goals can be found in the Care Plan section)  Acute Rehab PT Goals Patient Stated Goal: to make this pain go away and go home PT Goal Formulation: With patient/family Time For Goal Achievement: 09/19/16 Potential to Achieve Goals: Good    Frequency Min 5X/week   Barriers to discharge Inaccessible home environment stair to enter home    Co-evaluation               End of Session Equipment Utilized During Treatment: Gait belt;Back brace Activity Tolerance: Patient limited by pain Patient left: in bed;with call bell/phone within reach;with SCD's reapplied;with family/visitor present;with bed alarm set Nurse Communication: Mobility status    Functional Assessment Tool Used: clinical judgment Functional Limitation: Mobility: Walking and moving around Mobility: Walking and Moving Around Current Status [id number]): At least 20 percent but less than 40 percent impaired, limited or restricted Mobility: Walking and Moving Around Goal Status [phone]): At least 1 percent but less than 20 percent impaired, limited or restricted    Time: [phone] PT Time Calculation (min) (ACUTE ONLY): 25 min   Charges:   PT Evaluation $PT Eval Moderate Complexity: 1 Procedure PT Treatments $Gait Training: 8-22 mins   PT G Codes:   PT G-Codes **NOT FOR INPATIENT  CLASS** Functional Assessment Tool Used: clinical judgment Functional Limitation: Mobility: Walking and moving around Mobility: Walking and Moving Around Current Status [id number]): At least 20 percent but less than 40 percent impaired, limited or restricted Mobility: Walking and Moving Around Goal Status [phone]): At least 1 percent but less than 20 percent impaired, limited or restricted    [place] 09/05/2016, 10:43 AM  Wray Kearns, PT, DPT [phone]

## 2016-09-05 NOTE — Care Management Obs Status (Signed)
[facility] NOTIFICATION   Patient Details  Name: [name] [name] MRN: [id number] Date of Birth: 11-22-1941   Medicare Observation Status Notification Given:  Yes    Pollie Friar, RN 09/05/2016, 11:06 AM

## 2016-09-07 DIAGNOSIS — I714 Abdominal aortic aneurysm, without rupture: Secondary | ICD-10-CM | POA: Diagnosis not present

## 2016-09-07 DIAGNOSIS — M48062 Spinal stenosis, lumbar region with neurogenic claudication: Secondary | ICD-10-CM | POA: Diagnosis not present

## 2016-09-07 DIAGNOSIS — I10 Essential (primary) hypertension: Secondary | ICD-10-CM | POA: Diagnosis not present

## 2016-09-07 DIAGNOSIS — Z7982 Long term (current) use of aspirin: Secondary | ICD-10-CM | POA: Diagnosis not present

## 2016-09-07 DIAGNOSIS — I2510 Atherosclerotic heart disease of native coronary artery without angina pectoris: Secondary | ICD-10-CM | POA: Diagnosis not present

## 2016-09-07 DIAGNOSIS — Z4789 Encounter for other orthopedic aftercare: Secondary | ICD-10-CM | POA: Diagnosis not present

## 2016-09-10 DIAGNOSIS — Z7982 Long term (current) use of aspirin: Secondary | ICD-10-CM | POA: Diagnosis not present

## 2016-09-10 DIAGNOSIS — I714 Abdominal aortic aneurysm, without rupture: Secondary | ICD-10-CM | POA: Diagnosis not present

## 2016-09-10 DIAGNOSIS — M48062 Spinal stenosis, lumbar region with neurogenic claudication: Secondary | ICD-10-CM | POA: Diagnosis not present

## 2016-09-10 DIAGNOSIS — Z4789 Encounter for other orthopedic aftercare: Secondary | ICD-10-CM | POA: Diagnosis not present

## 2016-09-10 DIAGNOSIS — I10 Essential (primary) hypertension: Secondary | ICD-10-CM | POA: Diagnosis not present

## 2016-09-10 DIAGNOSIS — I2510 Atherosclerotic heart disease of native coronary artery without angina pectoris: Secondary | ICD-10-CM | POA: Diagnosis not present

## 2016-09-10 NOTE — Discharge Summary (Signed)
Physician Discharge Summary  Patient ID: [name] [name] MRN: [id number] DOB/AGE: 02/12/1941 75 y.o.  Admit date: 09/04/2016 Discharge date: 09/10/2016  Admission Diagnoses:  Lumbar spinal stenosis with neuropgenic claudication  Discharge Diagnoses:  Active Problems:   Spinal stenosis   Past Medical History:  Diagnosis Date  . AAA (abdominal aortic aneurysm) ([place])   . Back pain    10/2010  . Back pain    November, 2013  . CAD (coronary artery disease)   . Carotid artery disease ([place])    Doppler, November, 2013, 0-39% bilateral  . Carotid bruit    November, 2013  . Cataract   . DDD (degenerative disc disease), lumbar   . Dyslipidemia   . Ejection fraction    EF 55%, nuclear 2009  . GERD (gastroesophageal reflux disease)   . HTN (hypertension)   . Hx of CABG    2004  . 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 Hypertension   . Kidney stones   . MGUS (monoclonal gammopathy of unknown significance)   . Myocardial infarction   . RBBB (right bundle branch block with left anterior fascicular block)   . Spinal stenosis, lumbar    3-4  . Temporary low platelet count ([place])   . Thrombocytopenia ([place])    Dr. Ralene Ok    Surgeries: Procedure(s): DECOMPRESSION L3 - L4 on 09/04/2016   Consultants (if any):   Discharged Condition: Improved  Hospital Course: [name] [name] is an 75 y.o. male who was admitted 09/04/2016 with a diagnosis of Lumbar spinal stenosis and went to the operating room on 09/04/2016 and underwent the above named procedures.  Post op day one pt reported low level of pain controlled on oral meds.  He had decreased leg pain.  Pt was urinating w/o difficulty.  Pt was cleared by PT and discharged home on 09/05/16.  Hospital stay was uneventful.  He was given perioperative antibiotics:  Anti-infectives    Start     Dose/Rate Route Frequency Ordered Stop   09/05/16 0300  vancomycin (VANCOCIN) IVPB 1000 mg/200 mL premix     1,000  mg 200 mL/hr over 60 Minutes Intravenous  Once 09/04/16 1906 09/05/16 0428   09/04/16 1101  vancomycin (VANCOCIN) IVPB 1000 mg/200 mL premix     1,000 mg 200 mL/hr over 60 Minutes Intravenous 60 min pre-op 09/04/16 1101 09/04/16 1555    .  He was given sequential compression devices, early ambulation, and TED for DVT prophylaxis.  He benefited maximally from the hospital stay and there were no complications.    Recent vital signs:  Vitals:   09/05/16 0950 09/05/16 1411  BP: 123/75 118/69  Pulse: 99 88  Resp: 14 16  Temp: 99.6 F (37.6 C) 99.5 F (37.5 C)    Recent laboratory studies:  Lab Results  Component Value Date   HGB 14.7 09/04/2016   HGB 14.3 08/14/2016   HGB 13.9 05/26/2016   Lab Results  Component Value Date   WBC 11.3 (H) 09/04/2016   PLT 161 09/04/2016   No results found for: INR Lab Results  Component Value Date   NA 141 09/04/2016   K 3.8 09/04/2016   CL 107 09/04/2016   CO2 24 09/04/2016   BUN 11 09/04/2016   CREATININE 0.75 09/04/2016   GLUCOSE 93 09/04/2016    Discharge Medications:     Medication List    STOP taking these medications   diazepam 10 MG tablet  Commonly known as:  VALIUM   docusate sodium 100 MG capsule Commonly known as:  COLACE   traMADol 50 MG tablet Commonly known as:  ULTRAM     TAKE these medications   aspirin 81 MG tablet Take 81 mg by mouth daily.   CENTRUM SILVER ULTRA MENS PO Take by mouth daily.   esomeprazole 20 MG capsule Commonly known as:  NEXIUM Take 20 mg by mouth daily at 12 noon.   finasteride 5 MG tablet Commonly known as:  PROSCAR Take 5 mg by mouth daily.   fluticasone 50 MCG/ACT nasal spray Commonly known as:  FLONASE Place 2 sprays into both nostrils daily.   HYDROcodone-acetaminophen 10-325 MG tablet Commonly known as:  NORCO Take 1 tablet by mouth every 6 (six) hours as needed.   lisinopril 10 MG tablet Commonly known as:  PRINIVIL,ZESTRIL TAKE 1 (10 MG TOTAL) BY MOUTH  DAILY   methocarbamol 500 MG tablet Commonly known as:  ROBAXIN Take 1 tablet (500 mg total) by mouth 3 (three) times daily as needed for muscle spasms.   metoprolol 50 MG tablet Commonly known as:  LOPRESSOR Take 1 tablet (50 mg total) by mouth 2 (two) times daily.   NEOMYCIN-POLYMYXIN-HYDROCORTISONE 1 % Soln otic solution Commonly known as:  CORTISPORIN Place 3 drops into both ears 3 (three) times daily. What changed:  when to take this  reasons to take this   nitroGLYCERIN 0.4 MG SL tablet Commonly known as:  NITROSTAT Place 1 tablet (0.4 mg total) under the tongue every 5 (five) minutes as needed for chest pain.   ondansetron 4 MG tablet Commonly known as:  ZOFRAN Take 1 tablet (4 mg total) by mouth every 8 (eight) hours as needed for nausea or vomiting.   simvastatin 40 MG tablet Commonly known as:  ZOCOR Take 1 tablet (40 mg total) by mouth at bedtime.   tamsulosin 0.4 MG Caps capsule Commonly known as:  FLOMAX Take 0.4 mg by mouth 2 (two) times daily.       Diagnostic Studies: Dg Lumbar Spine 2-3 Views  Result Date: 09/04/2016 CLINICAL DATA:  L3-4 spinal decompression EXAM: LUMBAR SPINE - 2-3 VIEW COMPARISON:  CT of the lumbar spine and pre seeding myelogram performed 08/02/2010 FINDINGS: Normal spinal segmentation. Numbering based on previous exam. On image 1 of 2, localizing needles project over the posterior elements of L3 and L4. On image 2 of 2, metallic probe/marker projects over the L3-4 facet heading toward L3-4. IMPRESSION: Lumbar spinal localization of L3-4. These results were called by telephone at the time of interpretation on 09/04/2016 at 4:00 pm to Dr. Melina Schools , who verbally acknowledged these results in OR 4. Electronically Signed   By: Ashley Royalty M.D.   On: 09/04/2016 16:10    Disposition: 01-Home or Self Care Pt will present to clinic in 2 weeks Post op meds given to pt     Follow-up Information    BROOKS,DAHARI D, MD. Schedule an  appointment as soon as possible for a visit in 2 weeks.   Specialty:  Orthopedic Surgery Why:  If symptoms worsen, For suture removal, For wound re-check Contact information: [address] [place] [address] [phone]            Signed: Valinda Hoar 09/10/2016, 7:57 AM

## 2016-09-19 DIAGNOSIS — I10 Essential (primary) hypertension: Secondary | ICD-10-CM | POA: Diagnosis not present

## 2016-09-19 DIAGNOSIS — Z4789 Encounter for other orthopedic aftercare: Secondary | ICD-10-CM | POA: Diagnosis not present

## 2016-09-19 DIAGNOSIS — Z7982 Long term (current) use of aspirin: Secondary | ICD-10-CM | POA: Diagnosis not present

## 2016-09-19 DIAGNOSIS — I2510 Atherosclerotic heart disease of native coronary artery without angina pectoris: Secondary | ICD-10-CM | POA: Diagnosis not present

## 2016-09-19 DIAGNOSIS — M48062 Spinal stenosis, lumbar region with neurogenic claudication: Secondary | ICD-10-CM | POA: Diagnosis not present

## 2016-09-19 DIAGNOSIS — I714 Abdominal aortic aneurysm, without rupture: Secondary | ICD-10-CM | POA: Diagnosis not present

## 2016-10-15 ENCOUNTER — Telehealth: Payer: Self-pay | Admitting: Emergency Medicine

## 2016-10-15 NOTE — Telephone Encounter (Signed)
Pt called to let you know he got his flu shot on 09/30/16 at [facility]. Thanks.

## 2016-10-15 NOTE — Telephone Encounter (Signed)
Noted  

## 2016-10-23 DIAGNOSIS — M5136 Other intervertebral disc degeneration, lumbar region: Secondary | ICD-10-CM | POA: Diagnosis not present

## 2016-10-28 DIAGNOSIS — M5136 Other intervertebral disc degeneration, lumbar region: Secondary | ICD-10-CM | POA: Diagnosis not present

## 2016-11-04 DIAGNOSIS — M5136 Other intervertebral disc degeneration, lumbar region: Secondary | ICD-10-CM | POA: Diagnosis not present

## 2016-11-06 DIAGNOSIS — M5136 Other intervertebral disc degeneration, lumbar region: Secondary | ICD-10-CM | POA: Diagnosis not present

## 2016-11-10 DIAGNOSIS — M5136 Other intervertebral disc degeneration, lumbar region: Secondary | ICD-10-CM | POA: Diagnosis not present

## 2016-11-13 DIAGNOSIS — M5136 Other intervertebral disc degeneration, lumbar region: Secondary | ICD-10-CM | POA: Diagnosis not present

## 2016-11-17 DIAGNOSIS — M5136 Other intervertebral disc degeneration, lumbar region: Secondary | ICD-10-CM | POA: Diagnosis not present

## 2016-11-20 DIAGNOSIS — M5136 Other intervertebral disc degeneration, lumbar region: Secondary | ICD-10-CM | POA: Diagnosis not present

## 2016-11-24 DIAGNOSIS — M5136 Other intervertebral disc degeneration, lumbar region: Secondary | ICD-10-CM | POA: Diagnosis not present

## 2016-11-27 DIAGNOSIS — M5136 Other intervertebral disc degeneration, lumbar region: Secondary | ICD-10-CM | POA: Diagnosis not present

## 2016-12-03 DIAGNOSIS — M5136 Other intervertebral disc degeneration, lumbar region: Secondary | ICD-10-CM | POA: Diagnosis not present

## 2016-12-10 DIAGNOSIS — M5136 Other intervertebral disc degeneration, lumbar region: Secondary | ICD-10-CM | POA: Diagnosis not present

## 2016-12-12 DIAGNOSIS — M5136 Other intervertebral disc degeneration, lumbar region: Secondary | ICD-10-CM | POA: Diagnosis not present

## 2016-12-18 DIAGNOSIS — M5136 Other intervertebral disc degeneration, lumbar region: Secondary | ICD-10-CM | POA: Diagnosis not present

## 2016-12-25 DIAGNOSIS — M5136 Other intervertebral disc degeneration, lumbar region: Secondary | ICD-10-CM | POA: Diagnosis not present

## 2016-12-29 DIAGNOSIS — M5136 Other intervertebral disc degeneration, lumbar region: Secondary | ICD-10-CM | POA: Diagnosis not present

## 2017-01-01 DIAGNOSIS — M5136 Other intervertebral disc degeneration, lumbar region: Secondary | ICD-10-CM | POA: Diagnosis not present

## 2017-01-06 DIAGNOSIS — Z4789 Encounter for other orthopedic aftercare: Secondary | ICD-10-CM | POA: Diagnosis not present

## 2017-01-06 DIAGNOSIS — Z01812 Encounter for preprocedural laboratory examination: Secondary | ICD-10-CM | POA: Diagnosis not present

## 2017-01-07 DIAGNOSIS — Z961 Presence of intraocular lens: Secondary | ICD-10-CM | POA: Diagnosis not present

## 2017-01-07 DIAGNOSIS — H524 Presbyopia: Secondary | ICD-10-CM | POA: Diagnosis not present

## 2017-01-12 ENCOUNTER — Ambulatory Visit (INDEPENDENT_AMBULATORY_CARE_PROVIDER_SITE_OTHER): Payer: PPO | Admitting: Cardiology

## 2017-01-12 ENCOUNTER — Encounter: Payer: Self-pay | Admitting: Cardiology

## 2017-01-12 VITALS — BP 130/70 | HR 76 | Ht 70.0 in | Wt 174.0 lb

## 2017-01-12 DIAGNOSIS — I10 Essential (primary) hypertension: Secondary | ICD-10-CM | POA: Diagnosis not present

## 2017-01-12 DIAGNOSIS — M4800 Spinal stenosis, site unspecified: Secondary | ICD-10-CM

## 2017-01-12 DIAGNOSIS — I714 Abdominal aortic aneurysm, without rupture, unspecified: Secondary | ICD-10-CM

## 2017-01-12 DIAGNOSIS — Z01818 Encounter for other preprocedural examination: Secondary | ICD-10-CM

## 2017-01-12 DIAGNOSIS — E782 Mixed hyperlipidemia: Secondary | ICD-10-CM

## 2017-01-12 DIAGNOSIS — Z720 Tobacco use: Secondary | ICD-10-CM

## 2017-01-12 DIAGNOSIS — I2510 Atherosclerotic heart disease of native coronary artery without angina pectoris: Secondary | ICD-10-CM

## 2017-01-12 DIAGNOSIS — M5136 Other intervertebral disc degeneration, lumbar region: Secondary | ICD-10-CM | POA: Diagnosis not present

## 2017-01-12 MED ORDER — LISINOPRIL 10 MG PO TABS: ORAL_TABLET | ORAL | 3 refills | Status: DC

## 2017-01-12 MED ORDER — NITROGLYCERIN 0.4 MG SL SUBL: 0.4000 mg | SUBLINGUAL_TABLET | SUBLINGUAL | 3 refills | Status: DC | PRN

## 2017-01-12 MED ORDER — METOPROLOL TARTRATE 50 MG PO TABS: 50.0000 mg | ORAL_TABLET | Freq: Two times a day (BID) | ORAL | 3 refills | Status: DC

## 2017-01-12 MED ORDER — ASPIRIN 81 MG PO TABS: 81.0000 mg | ORAL_TABLET | Freq: Every day | ORAL | 3 refills | Status: DC

## 2017-01-12 MED ORDER — SIMVASTATIN 40 MG PO TABS: 40.0000 mg | ORAL_TABLET | Freq: Every day | ORAL | 3 refills | Status: DC

## 2017-01-12 NOTE — Patient Instructions (Signed)
Medication Instructions:  Your physician recommends that you continue on your current medications as directed. Please refer to the Current Medication list given to you today.  Labwork: No lab work today.  Testing/Procedures: No testing today.  Follow-Up: Your physician wants you to follow-up in: 1 year with Dr. Meda Coffee. You will receive a reminder letter in the mail two months in advance. If you don't receive a letter, please call our office to schedule the follow-up appointment.  If you need a refill on your cardiac medications before your next appointment, please call your pharmacy.

## 2017-01-12 NOTE — Progress Notes (Signed)
Cardiology Office Note    Date:  01/12/2017   ID:  RHONALD SAELENS, DOB 1941/05/01, MRN [id number]  PCP:  Hoyt Koch, MD  Cardiologist:  Dr. Ron Parker Dr. Meda Coffee  CC: pre op clearance- back surgery   History of Present Illness:  [name] [name] is a 76 y.o. male with a history of CAD s/p CABGx5V (2004), tobacco abuse, thrombocytopenia/MGUS, HTN, HLD, RBBB/LPFB, recently diagnosed small AAA followed by Dr. Donnetta Hutching and carotid artery disease who presents to clinic for pre op clearance prior to back surgery.  He underwent bypass surgery in 2004. Nuclear study in 2011 revealed no ischemia. His ejection fraction was 55%.  He was last seen by Dr Ron Parker in 01/2015. He was still smoking a couple cigarettes a day. No chest pain/SOB.  I saw him in clinic for yearly follow up in 01/2016. He was doing well with no complaints except for back pain that he thought may require surgery soon.   On recent lumbar spine films was found to have a 4 cm aneurysm. This was incompletely visualized and therefore he was sent to Dr. Donnetta Hutching for further discussion. Abdominal US on 07/22/16 showed 4.0 x 3.7 cm and 1.7 x 1.7 right common iliac artery dilatation.  It was felt to be small and asymptomatic. He recommended lifelong [place] follow up. He was cleared for back surgery from a AAA standpoint.   01/12/2017 - the patient is coming after 6 months, he states that he has been completely asymptomatic with chest pain shortness of breath palpitation dizziness, also denies any lower extremity edema syncope orthopnea or proximal nocturnal dyspnea. He's been compliant with his meds and has no side effects. He continues to smoke about 56 cigarettes a day. His only complaint is back pain that limits his activities. He underwent lumbar spine surgery in September with worsening of symptoms and is undergoing another MRI with possible redo surgery by Dr. Rolena Infante in [place] [facility] again.   Past Medical History:  Diagnosis  Date  . AAA (abdominal aortic aneurysm) ([place])   . Back pain    10/2010  . Back pain    November, 2013  . CAD (coronary artery disease)   . Carotid artery disease ([place])    Doppler, November, 2013, 0-39% bilateral  . Carotid bruit    November, 2013  . Cataract   . DDD (degenerative disc disease), lumbar   . Dyslipidemia   . Ejection fraction    EF 55%, nuclear 2009  . GERD (gastroesophageal reflux disease)   . HTN (hypertension)   . Hx of CABG    2004  . 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 Hypertension   . Kidney stones   . MGUS (monoclonal gammopathy of unknown significance)   . Myocardial infarction   . RBBB (right bundle branch block with left anterior fascicular block)   . Spinal stenosis, lumbar    3-4  . Temporary low platelet count ([place])   . Thrombocytopenia ([place])    Dr. Ralene Ok    Past Surgical History:  Procedure Laterality Date  . BACK SURGERY    . COLONOSCOPY    . CORONARY ARTERY BYPASS GRAFT  2004  . DECOMPRESSIVE LUMBAR LAMINECTOMY LEVEL 1 N/A 09/04/2016   Procedure: DECOMPRESSION L3 - L4;  Surgeon: Melina Schools, MD;  Location: [facility];  Service: Orthopedics;  Laterality: N/A;  . EYE SURGERY  2013   bilateral cataract extraction w/ IOL (Dr. Celene Squibb),  retina surgery  . JOINT REPLACEMENT    . LUMBAR LAMINECTOMY/DECOMPRESSION MICRODISCECTOMY  09/04/2016   L3-4  . TOTAL HIP ARTHROPLASTY Right 10/2003    Current Medications: Outpatient Medications Prior to Visit  Medication Sig Dispense Refill  . esomeprazole (NEXIUM) 20 MG capsule Take 20 mg by mouth daily at 12 noon.    . finasteride (PROSCAR) 5 MG tablet Take 5 mg by mouth daily.    . fluticasone (FLONASE) 50 MCG/ACT nasal spray Place 2 sprays into both nostrils daily. 16 g 6  . HYDROcodone-acetaminophen (NORCO) 10-325 MG tablet Take 1 tablet by mouth every 6 (six) hours as needed. 60 tablet 0  . methocarbamol (ROBAXIN) 500 MG tablet Take 1 tablet (500 mg total) by mouth  3 (three) times daily as needed for muscle spasms. 60 tablet 0  . Multiple Vitamins-Minerals (CENTRUM SILVER ULTRA MENS PO) Take by mouth daily.      . NEOMYCIN-POLYMYXIN-HYDROCORTISONE (CORTISPORIN) 1 % SOLN otic solution Place 3 drops into both ears 3 (three) times daily. (Patient taking differently: Place 3 drops into both ears 3 (three) times daily as needed (earache). ) 45 mL 3  . ondansetron (ZOFRAN) 4 MG tablet Take 1 tablet (4 mg total) by mouth every 8 (eight) hours as needed for nausea or vomiting. 20 tablet 0  . tamsulosin (FLOMAX) 0.4 MG CAPS capsule Take 0.4 mg by mouth 2 (two) times daily.   2  . aspirin 81 MG tablet Take 81 mg by mouth daily.      Marland Kitchen lisinopril (PRINIVIL,ZESTRIL) 10 MG tablet TAKE 1 (10 MG TOTAL) BY MOUTH DAILY 90 tablet 3  . metoprolol (LOPRESSOR) 50 MG tablet Take 1 tablet (50 mg total) by mouth 2 (two) times daily. 180 tablet 3  . nitroGLYCERIN (NITROSTAT) 0.4 MG SL tablet Place 1 tablet (0.4 mg total) under the tongue every 5 (five) minutes as needed for chest pain. 25 tablet 3  . simvastatin (ZOCOR) 40 MG tablet Take 1 tablet (40 mg total) by mouth at bedtime. 90 tablet 3   No facility-administered medications prior to visit.      Allergies:   Penicillins   Social History   Social History  . Marital status: Married    Spouse name: N/A  . Number of children: 2  . Years of education: 12   Occupational History  . trucking  Retired   Social History Main Topics  . Smoking status: Heavy Tobacco Smoker    Packs/day: 0.50    Years: 45.00    Types: Cigarettes  . Smokeless tobacco: Never Used     Comment: half pack day  . Alcohol use No  . Drug use: No  . Sexual activity: Not Currently   Other Topics Concern  . None   Social History Narrative   HSG, Married '62, 1 son-'70, 1 dtr - '68; 2 grandchildren. Work - Probation officer.   ACP - DNR, DNI, no artificial hydration or feeding, no heroic measures     Family History:  The patient's  family history includes Arthritis in his brother; COPD in his mother; Cancer in his paternal grandfather; Heart disease in his brother and brother; Kidney disease in his mother; Stroke in his father.     ROS:   Please see the history of present illness.    ROS All other systems reviewed and are negative.   PHYSICAL EXAM:   VS:  BP 130/70   Pulse 76   Ht 5\' 10"  (1.778 m)   Wt 174  lb (78.9 kg)   BMI 24.97 kg/m    GEN: Well nourished, well developed, in no acute distress  HEENT: normal  Neck: no JVD, carotid bruits, or masses Cardiac: RRR; no murmurs, rubs, or gallops,no edema  Respiratory:  clear to auscultation bilaterally, normal work of breathing GI: soft, nontender, nondistended, + BS MS: no deformity or atrophy  Skin: warm and dry, no rash Neuro:  Alert and Oriented x 3, Strength and sensation are intact Psych: euthymic mood, full affect  Wt Readings from Last 3 Encounters:  01/12/17 174 lb (78.9 kg)  09/04/16 170 lb (77.1 kg)  08/14/16 170 lb 14.4 oz (77.5 kg)      Studies/Labs Reviewed:   EKG:  EKG is ordered today.  The ekg ordered today demonstrates 1st de AV block, RBBB, LAFB HR 68  Recent Labs: 05/26/2016: ALT 16 09/04/2016: BUN 11; Creatinine, Ser 0.75; Hemoglobin 14.7; Platelets 161; Potassium 3.8; Sodium 141   Lipid Panel    Component Value Date/Time   CHOL 142 01/31/2016 0847   TRIG 77.0 01/31/2016 0847   HDL 48.50 01/31/2016 0847   CHOLHDL 3 01/31/2016 0847   VLDL 15.4 01/31/2016 0847   LDLCALC 78 01/31/2016 0847    Additional studies/ records that were reviewed today include:  2D ECHO, abdominal US and myoview- see HPI   ASSESSMENT & PLAN:   Pre op clearance: Again, there is currently no contraindication from cardiac standpoint for this patient to undergo spine surgery. He currently has no sign of angina or heart failure and no critical valvular disease. It is recommended that he continues taking metoprolol in the perioperative period.  CAD:  continue ASA, statin and BB. No ischemic symptoms, no ischemic workup needed at this time.  HTN: BP well controlled. Continue lisinopril 10mg  and lopressor 50mg  BID  HLD: LDL close to goal in 01/2016. Continue simvastatin 40mg  daily. It will be rechecked again these months by cancer Center.  Carotid artery disease: He had minimal disease in 2015, no reason to repeat unless his symptoms.  Tobacco abuse: counseled on cessation.   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 Followed cloesly by heme/onc  AAA: Abdominal US on 07/22/16 showed 4.0 x 3.7 cm and 1.7 x 1.7 right common iliac artery dilatation. Stable and followed by Dr. Donnetta Hutching.  Follow-up in one year.  Medication Adjustments/Labs and Tests Ordered: Current medicines are reviewed at length with the patient today.  Concerns regarding medicines are outlined above.  Medication changes, Labs and Tests ordered today are listed in the Patient Instructions below. Patient Instructions  Medication Instructions:  Your physician recommends that you continue on your current medications as directed. Please refer to the Current Medication list given to you today.  Labwork: No lab work today.  Testing/Procedures: No testing today.  Follow-Up: Your physician wants you to follow-up in: 1 year with Dr. Meda Coffee. You will receive a reminder letter in the mail two months in advance. If you don't receive a letter, please call our office to schedule the follow-up appointment.  If you need a refill on your cardiac medications before your next appointment, please call your pharmacy.      Signed, [name], MD  01/12/2017 8:23 AM    [address], [place], [place]  [address] Phone: [phone]; Fax: [phone]

## 2017-01-19 DIAGNOSIS — M5136 Other intervertebral disc degeneration, lumbar region: Secondary | ICD-10-CM | POA: Diagnosis not present

## 2017-01-19 DIAGNOSIS — Z9889 Other specified postprocedural states: Secondary | ICD-10-CM | POA: Diagnosis not present

## 2017-01-26 DIAGNOSIS — M545 Low back pain: Secondary | ICD-10-CM | POA: Diagnosis not present

## 2017-01-27 ENCOUNTER — Ambulatory Visit (INDEPENDENT_AMBULATORY_CARE_PROVIDER_SITE_OTHER): Payer: PPO | Admitting: Internal Medicine

## 2017-01-27 ENCOUNTER — Encounter: Payer: Self-pay | Admitting: Internal Medicine

## 2017-01-27 ENCOUNTER — Ambulatory Visit: Payer: PPO | Admitting: Internal Medicine

## 2017-01-27 DIAGNOSIS — T310 Burns involving less than 10% of body surface: Secondary | ICD-10-CM

## 2017-01-27 DIAGNOSIS — Z79891 Long term (current) use of opiate analgesic: Secondary | ICD-10-CM | POA: Diagnosis not present

## 2017-01-27 DIAGNOSIS — G894 Chronic pain syndrome: Secondary | ICD-10-CM | POA: Diagnosis not present

## 2017-01-27 DIAGNOSIS — M5136 Other intervertebral disc degeneration, lumbar region: Secondary | ICD-10-CM | POA: Diagnosis not present

## 2017-01-27 MED ORDER — DIAZEPAM 10 MG PO TABS: 10.0000 mg | ORAL_TABLET | Freq: Two times a day (BID) | ORAL | 0 refills | Status: DC | PRN

## 2017-01-27 NOTE — Progress Notes (Signed)
Pre visit review using our clinic review tool, if applicable. No additional management support is needed unless otherwise documented below in the visit note. 

## 2017-01-28 DIAGNOSIS — T310 Burns involving less than 10% of body surface: Secondary | ICD-10-CM | POA: Insufficient documentation
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
## 2017-01-30 DIAGNOSIS — M545 Low back pain: Secondary | ICD-10-CM | POA: Diagnosis not present

## 2017-02-02 DIAGNOSIS — M545 Low back pain: Secondary | ICD-10-CM | POA: Diagnosis not present

## 2017-02-04 DIAGNOSIS — M545 Low back pain: Secondary | ICD-10-CM | POA: Diagnosis not present

## 2017-02-05 ENCOUNTER — Telehealth: Payer: Self-pay | Admitting: *Deleted

## 2017-02-05 NOTE — Telephone Encounter (Signed)
Rec'd call pt states saw md week or so ago and she was suppose to send his medication to his pharmacy. Have not heard from anyone, wanting to know where rx was sent. Inform pt per chart rx was faxed to [facility] [address]...[name] [name]

## 2017-02-05 NOTE — Telephone Encounter (Signed)
Pt call back nad stated [facility] states they never received [facility]iazepam script that was faxed on 2/20. Inform [facility] will call [facility] give order to fill. [facility] spoke Christy versified if rx was received, and which it wasn't gave verbal to fill from 2/20...Johny Chess

## 2017-02-09 DIAGNOSIS — M545 Low back pain: Secondary | ICD-10-CM | POA: Diagnosis not present

## 2017-02-11 DIAGNOSIS — M545 Low back pain: Secondary | ICD-10-CM | POA: Diagnosis not present

## 2017-05-13 NOTE — Progress Notes (Signed)
Pre visit review using our clinic review tool, if applicable. No additional management support is needed unless otherwise documented below in the visit note. 

## 2017-05-13 NOTE — Progress Notes (Signed)
Subjective:   [name] [name] is a 76 y.o. male who presents for Medicare Annual/Subsequent preventive examination.  Review of Systems:  No ROS.  Medicare Wellness Visit.  Cardiac Risk Factors include: advanced age (>59men, >26 women);male gender;dyslipidemia;hypertension Sleep patterns: has frequent nighttime awakenings, gets up 5 times nightly to void and sleeps 5-6 hours nightly. Current sleep issues due to frequent urination, patient has an upcoming appointment with urology.   Home Safety/Smoke Alarms: Feels safe in home. Smoke alarms in place.  Living environment; residence and Firearm Safety: 1-story house/ trailer, equipment: Radio producer, Type: Single Point [place] and Walkers, Type: Conservation officer, nature, Transport planner, no firearms. Lives with wife, good family  Support system Seat Belt Safety/Bike Helmet: Wears seat belt.   Counseling:   Eye Exam- appointment yearly Dental- appointment yearly  Male:   CCS- Last 02/15/13, polyps, recall 3 years, referral placed today     PSA- No results found for: PSA     Objective:    Vitals: BP 136/84   Pulse 60   Resp 20   Ht 5\' 10"  (1.778 m)   Wt 167 lb (75.8 kg)   SpO2 100%   BMI 23.96 kg/m   Body mass index is 23.96 kg/m.  Tobacco History  Smoking Status  . Heavy Tobacco Smoker  . Packs/day: 0.50  . Years: 45.00  . Types: Cigarettes  Smokeless Tobacco  . Never Used    Comment: half pack day     Ready to quit: Not Answered Counseling given: Not Answered   Past Medical History:  Diagnosis Date  . AAA (abdominal aortic aneurysm) ([place])   . Back pain    10/2010  . Back pain    November, 2013  . CAD (coronary artery disease)   . Carotid artery disease ([place])    Doppler, November, 2013, 0-39% bilateral  . Carotid bruit    November, 2013  . Cataract   . DDD (degenerative disc disease), lumbar   . Dyslipidemia   . Ejection fraction    EF 55%, nuclear 2009  . GERD (gastroesophageal reflux disease)   . HTN (hypertension)     . Hx of CABG    2004  . 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 Hypertension   . Kidney stones   . MGUS (monoclonal gammopathy of unknown significance)   . Myocardial infarction ([place])   . RBBB (right bundle branch block with left anterior fascicular block)   . Spinal stenosis, lumbar    3-4  . Temporary low platelet count ([place])   . Thrombocytopenia ([place])    Dr. Ralene Ok   Past Surgical History:  Procedure Laterality Date  . BACK SURGERY    . COLONOSCOPY    . CORONARY ARTERY BYPASS GRAFT  2004  . DECOMPRESSIVE LUMBAR LAMINECTOMY LEVEL 1 N/A 09/04/2016   Procedure: DECOMPRESSION L3 - L4;  Surgeon: Melina Schools, MD;  Location: [facility];  Service: Orthopedics;  Laterality: N/A;  . EYE SURGERY  2013   bilateral cataract extraction w/ IOL (Dr. Celene Squibb), retina surgery  . JOINT REPLACEMENT    . LUMBAR LAMINECTOMY/DECOMPRESSION MICRODISCECTOMY  09/04/2016   L3-4  . TOTAL HIP ARTHROPLASTY Right 10/2003   Family History  Problem Relation Age of Onset  . Kidney disease Mother   . COPD Mother   . Stroke Father        cerebral hemorrhage  . Heart disease Brother   . Heart disease Brother   .  Arthritis Brother   . Cancer Paternal Grandfather    History  Sexual Activity  . Sexual activity: Not Currently    Outpatient Encounter Prescriptions as of 05/14/2017  Medication Sig  . aspirin 81 MG tablet Take 1 tablet (81 mg total) by mouth daily.  . diazepam (VALIUM) 10 MG tablet Take 1 tablet (10 mg total) by mouth every 12 (twelve) hours as needed for anxiety.  Marland Kitchen esomeprazole (NEXIUM) 20 MG capsule Take 20 mg by mouth daily at 12 noon.  . finasteride (PROSCAR) 5 MG tablet Take 5 mg by mouth daily.  . fluticasone (FLONASE) 50 MCG/ACT nasal spray Place 2 sprays into both nostrils daily.  Marland Kitchen HYDROcodone-acetaminophen (NORCO) 10-325 MG tablet Take 1 tablet by mouth every 6 (six) hours as needed.  Marland Kitchen lisinopril (PRINIVIL,ZESTRIL) 10 MG tablet TAKE 1 (10 MG TOTAL) BY  MOUTH DAILY  . metoprolol (LOPRESSOR) 50 MG tablet Take 1 tablet (50 mg total) by mouth 2 (two) times daily.  . Multiple Vitamins-Minerals (CENTRUM SILVER ULTRA MENS PO) Take by mouth daily.    . nitroGLYCERIN (NITROSTAT) 0.4 MG SL tablet Place 1 tablet (0.4 mg total) under the tongue every 5 (five) minutes as needed for chest pain.  Marland Kitchen ondansetron (ZOFRAN) 4 MG tablet Take 1 tablet (4 mg total) by mouth every 8 (eight) hours as needed for nausea or vomiting.  . simvastatin (ZOCOR) 40 MG tablet Take 1 tablet (40 mg total) by mouth at bedtime.  . tamsulosin (FLOMAX) 0.4 MG CAPS capsule Take 0.4 mg by mouth 2 (two) times daily.   . traMADol (ULTRAM) 50 MG tablet Take 1 tablet by mouth 3 (three) times daily as needed.  . [DISCONTINUED] methocarbamol (ROBAXIN) 500 MG tablet Take 1 tablet (500 mg total) by mouth 3 (three) times daily as needed for muscle spasms. (Patient not taking: Reported on 05/14/2017)  . [DISCONTINUED] NEOMYCIN-POLYMYXIN-HYDROCORTISONE (CORTISPORIN) 1 % SOLN otic solution Place 3 drops into both ears 3 (three) times daily. (Patient not taking: Reported on 05/14/2017)   No facility-administered encounter medications on file as of 05/14/2017.     Activities of Daily Living In your present state of health, do you have any difficulty performing the following activities: 05/14/2017 09/04/2016  Hearing? Tempie Donning  Vision? N N  Difficulty concentrating or making decisions? N N  Walking or climbing stairs? Y Y  Dressing or bathing? N N  Doing errands, shopping? N -  Preparing Food and eating ? N -  Using the Toilet? N -  In the past six months, have you accidently leaked urine? Y -  Do you have problems with loss of bowel control? N -  Managing your Medications? N -  Managing your Finances? N -  Housekeeping or managing your Housekeeping? N -  Some recent data might be hidden    Patient Care Team: Hoyt Koch, MD as PCP - General (Internal Medicine) Suella Broad, MD (Physical  Medicine and Rehabilitation) Murinson, Haynes Bast, MD (Hematology and Oncology)   Assessment:    Physical assessment deferred to PCP.  Exercise Activities and Dietary recommendations Current Exercise Habits: Structured exercise class, Type of exercise: Other - see comments [facility] pool exercises), Time (Minutes): 35, Frequency (Times/Week): 3, Weekly Exercise (Minutes/Week): 105, Intensity: Mild, Exercise limited by: orthopedic condition(s)  Diet (meal preparation, eat out, water intake, caffeinated beverages, dairy products, fruits and vegetables): in general, a "healthy" diet  , well balanced, low salt eats a variety of fruits and vegetables daily. Reports he does  not limit salt, fat/cholesterol, drinks 5-6 cups of coffee daily, 1-2 glasses of water daily.  Reviewed heart healthy diet, reading food labels, encouraged patient to increase daily water intake and to decrease daily intake of coffee.   Goals    . Maintain current health status          Continue to exercise, be as active as possible, show my tractors, be the best family man I can be.      Fall Risk Fall Risk  05/14/2017 01/31/2016 05/23/2014  Falls in the past year? No No No  Risk for fall due to : Impaired balance/gait;Impaired mobility - Impaired balance/gait  Risk for fall due to (comments): - - has a bad back and when pain hits makes him unsteady   Depression Screen PHQ 2/9 Scores 05/14/2017 01/31/2016 05/23/2014  PHQ - 2 Score 1 0 0    Cognitive Function       Ad8 score reviewed for issues:  Issues making decisions: no  Less interest in hobbies / activities: no  Repeats questions, stories (family complaining): no  Trouble using ordinary gadgets (microwave, computer, phone): no  Forgets the month or year: no  Mismanaging finances: no  Remembering appts: no  Daily problems with thinking and/or memory: no Ad8 score is= 0  Immunization History  Administered Date(s) Administered  . Influenza Split  11/11/2011  . Influenza, High Dose Seasonal PF 11/05/2016  . Influenza, Seasonal, Injecte, Preservative Fre 12/06/2012  . Influenza,inj,Quad PF,36+ Mos 01/19/2015, 01/31/2016  . Pneumococcal Conjugate-13 07/31/2015  . Pneumococcal Polysaccharide-23 12/06/2012  . Tdap 11/11/2011   Screening Tests Health Maintenance  Topic Date Due  . INFLUENZA VACCINE  07/08/2017  . TETANUS/TDAP  11/10/2021  . PNA vac Low Risk Adult  Completed      Plan:    Continue to eat heart healthy diet (full of fruits, vegetables, whole grains, lean protein, water--limit salt, fat, and sugar intake) and increase physical activity as tolerated.  Continue doing brain stimulating activities (puzzles, reading, adult coloring books, staying active) to keep memory sharp.   I have personally reviewed and noted the following in the patient's chart:   . Medical and social history . Use of alcohol, tobacco or illicit drugs  . Current medications and supplements . Functional ability and status . Nutritional status . Physical activity . Advanced directives . List of other physicians . Vitals . Screenings to include cognitive, depression, and falls . Referrals and appointments  In addition, I have reviewed and discussed with patient certain preventive protocols, quality metrics, and best practice recommendations. A written personalized care plan for preventive services as well as general preventive health recommendations were provided to patient.     Michiel Cowboy, RN  05/14/2017

## 2017-05-14 ENCOUNTER — Ambulatory Visit (INDEPENDENT_AMBULATORY_CARE_PROVIDER_SITE_OTHER): Payer: PPO | Admitting: *Deleted

## 2017-05-14 VITALS — BP 136/84 | HR 60 | Resp 20 | Ht 70.0 in | Wt 167.0 lb

## 2017-05-14 DIAGNOSIS — Z1211 Encounter for screening for malignant neoplasm of colon: Secondary | ICD-10-CM

## 2017-05-14 DIAGNOSIS — Z0000 Encounter for general adult medical examination without abnormal findings: Secondary | ICD-10-CM

## 2017-05-14 NOTE — Progress Notes (Signed)
Medical screening examination/treatment/procedure(s) were performed by non-physician practitioner and as supervising physician I was immediately available for consultation/collaboration. I agree with above. Elizabeth A Crawford, MD 

## 2017-05-14 NOTE — Patient Instructions (Addendum)
Continue to eat heart healthy diet (full of fruits, vegetables, whole grains, lean protein, water--limit salt, fat, and sugar intake) and increase physical activity as tolerated.  Continue doing brain stimulating activities (puzzles, reading, adult coloring books, staying active) to keep memory sharp.    Mr. Lenhoff , Thank you for taking time to come for your Medicare Wellness Visit. I appreciate your ongoing commitment to your health goals. Please review the following plan we discussed and let me know if I can assist you in the future.   These are the goals we discussed: Goals    . Maintain current health status          Continue to exercise, be as active as possible, show my tractors, be the best family man I can be.       This is a list of the screening recommended for you and due dates:  Health Maintenance  Topic Date Due  . Flu Shot  07/08/2017  . Tetanus Vaccine  11/10/2021  . Pneumonia vaccines  Completed

## 2017-05-15 ENCOUNTER — Encounter: Payer: Self-pay | Admitting: Gastroenterology

## 2017-05-18 ENCOUNTER — Ambulatory Visit: Payer: PPO | Admitting: Internal Medicine

## 2017-05-18 ENCOUNTER — Encounter: Payer: Self-pay | Admitting: Nurse Practitioner

## 2017-05-18 ENCOUNTER — Ambulatory Visit (INDEPENDENT_AMBULATORY_CARE_PROVIDER_SITE_OTHER): Payer: PPO | Admitting: Nurse Practitioner

## 2017-05-18 VITALS — BP 132/78 | HR 71 | Temp 97.9°F | Ht 70.0 in | Wt 170.0 lb

## 2017-05-18 DIAGNOSIS — I2510 Atherosclerotic heart disease of native coronary artery without angina pectoris: Secondary | ICD-10-CM

## 2017-05-18 DIAGNOSIS — H6502 Acute serous otitis media, left ear: Secondary | ICD-10-CM | POA: Diagnosis not present

## 2017-05-18 MED ORDER — NEOMYCIN-POLYMYXIN-HC 3.5-10000-1 OT SOLN
4.0000 [drp] | Freq: Three times a day (TID) | OTIC | 0 refills | Status: DC
Start: 2017-05-18 — End: 2017-08-31

## 2017-05-18 MED ORDER — FLUTICASONE PROPIONATE 50 MCG/ACT NA SUSP: 2.0000 | Freq: Every day | NASAL | 0 refills | Status: DC

## 2017-05-18 MED ORDER — NITROGLYCERIN 0.4 MG SL SUBL: 0.4000 mg | SUBLINGUAL_TABLET | SUBLINGUAL | 3 refills | Status: AC | PRN

## 2017-05-18 NOTE — Patient Instructions (Signed)
Otitis Media, Adult Otitis media is redness, soreness, and puffiness (swelling) in the space just behind your eardrum (middle ear). It may be caused by allergies or infection. It often happens along with a cold. Follow these instructions at home:  Take your medicine as told. Finish it even if you start to feel better.  Only take over-the-counter or prescription medicines for pain, discomfort, or fever as told by your doctor.  Follow up with your doctor as told. Contact a doctor if:  You have otitis media only in one ear, or bleeding from your nose, or both.  You notice a lump on your neck.  You are not getting better in 3-5 days.  You feel worse instead of better. Get help right away if:  You have pain that is not helped with medicine.  You have puffiness, redness, or pain around your ear.  You get a stiff neck.  You cannot move part of your face (paralysis).  You notice that the bone behind your ear hurts when you touch it. This information is not intended to replace advice given to you by your health care provider. Make sure you discuss any questions you have with your health care provider. Document Released: 05/12/2008 Document Revised: 05/01/2016 Document Reviewed: 06/21/2013 Elsevier Interactive Patient Education  2017 [employer]  

## 2017-05-18 NOTE — Progress Notes (Signed)
Subjective:  Patient ID: [name] [name], male    DOB: 02-Dec-1941  Age: 76 y.o. MRN: [id number]  CC: Ear Fullness (ears problem,hear cracking inside going on for month/nitroglycerin refill?)   Ear Fullness   There is pain in the left ear. This is a new problem. The current episode started in the past 7 days. The problem occurs constantly. The problem has been unchanged. There has been no fever. Associated symptoms include hearing loss. Pertinent negatives include no abdominal pain, coughing, diarrhea, ear discharge, headaches, neck pain, rash, rhinorrhea, sore throat or vomiting. He has tried nothing for the symptoms.    Outpatient Medications Prior to Visit  Medication Sig Dispense Refill  . aspirin 81 MG tablet Take 1 tablet (81 mg total) by mouth daily. 90 tablet 3  . diazepam (VALIUM) 10 MG tablet Take 1 tablet (10 mg total) by mouth every 12 (twelve) hours as needed for anxiety. 30 tablet 0  . esomeprazole (NEXIUM) 20 MG capsule Take 20 mg by mouth daily at 12 noon.    . finasteride (PROSCAR) 5 MG tablet Take 5 mg by mouth daily.    Marland Kitchen HYDROcodone-acetaminophen (NORCO) 10-325 MG tablet Take 1 tablet by mouth every 6 (six) hours as needed. 60 tablet 0  . lisinopril (PRINIVIL,ZESTRIL) 10 MG tablet TAKE 1 (10 MG TOTAL) BY MOUTH DAILY 90 tablet 3  . metoprolol (LOPRESSOR) 50 MG tablet Take 1 tablet (50 mg total) by mouth 2 (two) times daily. 180 tablet 3  . Multiple Vitamins-Minerals (CENTRUM SILVER ULTRA MENS PO) Take by mouth daily.      . ondansetron (ZOFRAN) 4 MG tablet Take 1 tablet (4 mg total) by mouth every 8 (eight) hours as needed for nausea or vomiting. 20 tablet 0  . simvastatin (ZOCOR) 40 MG tablet Take 1 tablet (40 mg total) by mouth at bedtime. 90 tablet 3  . tamsulosin (FLOMAX) 0.4 MG CAPS capsule Take 0.4 mg by mouth 2 (two) times daily.   2  . traMADol (ULTRAM) 50 MG tablet Take 1 tablet by mouth 3 (three) times daily as needed.    . fluticasone (FLONASE) 50 MCG/ACT nasal  spray Place 2 sprays into both nostrils daily. 16 g 6  . nitroGLYCERIN (NITROSTAT) 0.4 MG SL tablet Place 1 tablet (0.4 mg total) under the tongue every 5 (five) minutes as needed for chest pain. 25 tablet 3   No facility-administered medications prior to visit.     ROS See HPI  Objective:  BP 132/78   Pulse 71   Temp 97.9 F (36.6 C)   Ht 5\' 10"  (1.778 m)   Wt 170 lb (77.1 kg)   SpO2 99%   BMI 24.39 kg/m   BP Readings from Last 3 Encounters:  05/18/17 132/78  05/14/17 136/84  01/27/17 132/70    Wt Readings from Last 3 Encounters:  05/18/17 170 lb (77.1 kg)  05/14/17 167 lb (75.8 kg)  01/27/17 169 lb 12 oz (77 kg)    Physical Exam  Constitutional: He is oriented to person, place, and time.  HENT:  Right Ear: Tympanic membrane and ear canal normal. There is mastoid tenderness. Tympanic membrane is not injected and not erythematous. No middle ear effusion.  Left Ear: There is drainage. No mastoid tenderness. Tympanic membrane is injected and erythematous. A middle ear effusion is present.  Nose: Nose normal.  Mouth/Throat: Oropharynx is clear and moist. No oropharyngeal exudate.  Neck: Normal range of motion. Neck supple.  Cardiovascular: Normal rate.  Pulmonary/Chest: Effort normal.  Lymphadenopathy:    He has no cervical adenopathy.  Neurological: He is alert and oriented to person, place, and time.  Skin: Skin is warm and dry.  Vitals reviewed.   Lab Results  Component Value Date   WBC 11.3 (H) 09/04/2016   HGB 14.7 09/04/2016   HCT 44.5 09/04/2016   PLT 161 09/04/2016   GLUCOSE 93 09/04/2016   CHOL 142 01/31/2016   TRIG 77.0 01/31/2016   HDL 48.50 01/31/2016   LDLCALC 78 01/31/2016   ALT 16 05/26/2016   AST 19 05/26/2016   NA 141 09/04/2016   K 3.8 09/04/2016   CL 107 09/04/2016   CREATININE 0.75 09/04/2016   BUN 11 09/04/2016   CO2 24 09/04/2016    No results found.  Assessment & Plan:   Milad was seen today for ear  fullness.  Diagnoses and all orders for this visit:  Acute serous otitis media of left ear, recurrence not specified -     neomycin-polymyxin-hydrocortisone (CORTISPORIN) OTIC solution; Place 4 drops into the left ear 3 (three) times daily. -     fluticasone (FLONASE) 50 MCG/ACT nasal spray; Place 2 sprays into both nostrils daily.  Coronary artery disease involving native coronary artery of native heart without angina pectoris -     nitroGLYCERIN (NITROSTAT) 0.4 MG SL tablet; Place 1 tablet (0.4 mg total) under the tongue every 5 (five) minutes as needed for chest pain.   I have discontinued Mr. Munford's fluticasone. I am also having him start on neomycin-polymyxin-hydrocortisone and fluticasone. Additionally, I am having him maintain his Multiple Vitamins-Minerals (CENTRUM SILVER ULTRA MENS PO), finasteride, esomeprazole, tamsulosin, HYDROcodone-acetaminophen, ondansetron, traMADol, aspirin, lisinopril, metoprolol tartrate, simvastatin, diazepam, and nitroGLYCERIN.  Meds ordered this encounter  Medications  . nitroGLYCERIN (NITROSTAT) 0.4 MG SL tablet    Sig: Place 1 tablet (0.4 mg total) under the tongue every 5 (five) minutes as needed for chest pain.    Dispense:  25 tablet    Refill:  3    Order Specific Question:   Supervising Provider    Answer:   Cassandria Anger [[id number]]  . neomycin-polymyxin-hydrocortisone (CORTISPORIN) OTIC solution    Sig: Place 4 drops into the left ear 3 (three) times daily.    Dispense:  10 mL    Refill:  0    Order Specific Question:   Supervising Provider    Answer:   Cassandria Anger [[id number]]  . fluticasone (FLONASE) 50 MCG/ACT nasal spray    Sig: Place 2 sprays into both nostrils daily.    Dispense:  16 g    Refill:  0    Order Specific Question:   Supervising Provider    Answer:   Cassandria Anger [[id number]]    Follow-up: Return if symptoms worsen or fail to improve.  Wilfred Lacy, NP

## 2017-06-03 ENCOUNTER — Other Ambulatory Visit: Payer: Self-pay | Admitting: Medical Oncology

## 2017-06-03 DIAGNOSIS — D472 Monoclonal gammopathy: Secondary | ICD-10-CM

## 2017-06-04 ENCOUNTER — Telehealth: Payer: Self-pay | Admitting: Internal Medicine

## 2017-06-04 ENCOUNTER — Other Ambulatory Visit (HOSPITAL_BASED_OUTPATIENT_CLINIC_OR_DEPARTMENT_OTHER): Payer: PPO

## 2017-06-04 ENCOUNTER — Ambulatory Visit (HOSPITAL_BASED_OUTPATIENT_CLINIC_OR_DEPARTMENT_OTHER): Payer: PPO | Admitting: Internal Medicine

## 2017-06-04 VITALS — BP 160/76 | HR 70 | Temp 97.7°F | Resp 20 | Wt 169.1 lb

## 2017-06-04 DIAGNOSIS — D472 Monoclonal gammopathy: Secondary | ICD-10-CM

## 2017-06-04 DIAGNOSIS — M4854XA Collapsed vertebra, not elsewhere classified, thoracic region, initial encounter for fracture: Secondary | ICD-10-CM | POA: Diagnosis not present

## 2017-06-04 DIAGNOSIS — I10 Essential (primary) hypertension: Secondary | ICD-10-CM

## 2017-06-04 LAB — CBC WITH DIFFERENTIAL/PLATELET
BASO%: 0.6 % (ref 0.0–2.0)
Basophils Absolute: 0.1 10*3/uL (ref 0.0–0.1)
EOS ABS: 0.6 10*3/uL — AB (ref 0.0–0.5)
EOS%: 6.5 % (ref 0.0–7.0)
HEMATOCRIT: 41.4 % (ref 38.4–49.9)
HEMOGLOBIN: 13.8 g/dL (ref 13.0–17.1)
LYMPH#: 2.2 10*3/uL (ref 0.9–3.3)
LYMPH%: 25.6 % (ref 14.0–49.0)
MCH: 30.9 pg (ref 27.2–33.4)
MCHC: 33.2 g/dL (ref 32.0–36.0)
MCV: 92.9 fL (ref 79.3–98.0)
MONO#: 0.6 10*3/uL (ref 0.1–0.9)
MONO%: 6.7 % (ref 0.0–14.0)
NEUT#: 5.3 10*3/uL (ref 1.5–6.5)
NEUT%: 60.6 % (ref 39.0–75.0)
PLATELETS: 180 10*3/uL (ref 140–400)
RBC: 4.46 10*6/uL (ref 4.20–5.82)
RDW: 14 % (ref 11.0–14.6)
WBC: 8.8 10*3/uL (ref 4.0–10.3)

## 2017-06-04 LAB — COMPREHENSIVE METABOLIC PANEL
ALBUMIN: 3.7 g/dL (ref 3.5–5.0)
ALK PHOS: 77 U/L (ref 40–150)
ALT: 16 U/L (ref 0–55)
AST: 18 U/L (ref 5–34)
Anion Gap: 6 mEq/L (ref 3–11)
BILIRUBIN TOTAL: 0.44 mg/dL (ref 0.20–1.20)
BUN: 12 mg/dL (ref 7.0–26.0)
CO2: 26 mEq/L (ref 22–29)
Calcium: 9.5 mg/dL (ref 8.4–10.4)
Chloride: 108 mEq/L (ref 98–109)
Creatinine: 0.8 mg/dL (ref 0.7–1.3)
EGFR: 87 mL/min/{1.73_m2} — AB (ref >90)
GLUCOSE: 101 mg/dL (ref 70–140)
Potassium: 5.1 mEq/L (ref 3.5–5.1)
Sodium: 139 mEq/L (ref 136–145)
TOTAL PROTEIN: 6.7 g/dL (ref 6.4–8.3)

## 2017-06-04 LAB — LACTATE DEHYDROGENASE: LDH: 140 U/L (ref 125–245)

## 2017-06-04 NOTE — Progress Notes (Signed)
[facility] [phone]   [phone]  OFFICE PROGRESS NOTE  Hoyt Koch, MD [place] [address]  DIAGNOSIS:  1) monoclonal history of undetermined significance. 2) thrombocytopenia  PRIOR THERAPY: None  CURRENT THERAPY: Observation  INTERVAL HISTORY: [name] [name] 76 y.o. male returns to the clinic today for follow-up visit. The patient is feeling fine today with no specific complaints. He denied having any fatigue or weakness. He has no chest pain, shortness of breath, cough or hemoptysis. He denied having any nausea, vomiting, diarrhea or constipation. He continues to have back pain from compression fracture. She is here today for evaluation and repeat myeloma panel and he is here today for evaluation and discussion of his lab results.  MEDICAL HISTORY: Past Medical History:  Diagnosis Date  . AAA (abdominal aortic aneurysm) ([place])   . Back pain    10/2010  . Back pain    November, 2013  . CAD (coronary artery disease)   . Carotid artery disease ([place])    Doppler, November, 2013, 0-39% bilateral  . Carotid bruit    November, 2013  . Cataract   . DDD (degenerative disc disease), lumbar   . Dyslipidemia   . Ejection fraction    EF 55%, nuclear 2009  . GERD (gastroesophageal reflux disease)   . HTN (hypertension)   . Hx of CABG    2004  . 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 Hypertension   . Kidney stones   . MGUS (monoclonal gammopathy of unknown significance)   . Myocardial infarction ([place])   . RBBB (right bundle branch block with left anterior fascicular block)   . Spinal stenosis, lumbar    3-4  . Temporary low platelet count ([place])   . Thrombocytopenia ([place])    Dr. Ralene Ok    ALLERGIES:  is allergic to penicillins.  MEDICATIONS:  Current Outpatient Prescriptions  Medication Sig Dispense Refill  . aspirin 81 MG tablet Take 1 tablet (81 mg total) by  mouth daily. 90 tablet 3  . diazepam (VALIUM) 10 MG tablet Take 1 tablet (10 mg total) by mouth every 12 (twelve) hours as needed for anxiety. 30 tablet 0  . esomeprazole (NEXIUM) 20 MG capsule Take 20 mg by mouth daily at 12 noon.    . finasteride (PROSCAR) 5 MG tablet Take 5 mg by mouth daily.    . fluticasone (FLONASE) 50 MCG/ACT nasal spray Place 2 sprays into both nostrils daily. 16 g 0  . HYDROcodone-acetaminophen (NORCO) 10-325 MG tablet Take 1 tablet by mouth every 6 (six) hours as needed. 60 tablet 0  . lisinopril (PRINIVIL,ZESTRIL) 10 MG tablet TAKE 1 (10 MG TOTAL) BY MOUTH DAILY 90 tablet 3  . metoprolol (LOPRESSOR) 50 MG tablet Take 1 tablet (50 mg total) by mouth 2 (two) times daily. 180 tablet 3  . Multiple Vitamins-Minerals (CENTRUM SILVER ULTRA MENS PO) Take by mouth daily.      Marland Kitchen neomycin-polymyxin-hydrocortisone (CORTISPORIN) OTIC solution Place 4 drops into the left ear 3 (three) times daily. 10 mL 0  . nitroGLYCERIN (NITROSTAT) 0.4 MG SL tablet Place 1 tablet (0.4 mg total) under the tongue every 5 (five) minutes as needed for chest pain. 25 tablet 3  . ondansetron (ZOFRAN) 4 MG tablet Take 1 tablet (4 mg total) by mouth every 8 (eight) hours as needed for nausea or vomiting. 20 tablet 0  . simvastatin (  ZOCOR) 40 MG tablet Take 1 tablet (40 mg total) by mouth at bedtime. 90 tablet 3  . tamsulosin (FLOMAX) 0.4 MG CAPS capsule Take 0.4 mg by mouth 2 (two) times daily.   2  . traMADol (ULTRAM) 50 MG tablet Take 1 tablet by mouth 3 (three) times daily as needed.     No current facility-administered medications for this visit.     SURGICAL HISTORY:  Past Surgical History:  Procedure Laterality Date  . BACK SURGERY    . COLONOSCOPY    . CORONARY ARTERY BYPASS GRAFT  2004  . DECOMPRESSIVE LUMBAR LAMINECTOMY LEVEL 1 N/A 09/04/2016   Procedure: DECOMPRESSION L3 - L4;  Surgeon: Melina Schools, MD;  Location: [facility];  Service: Orthopedics;  Laterality: N/A;  . EYE SURGERY  2013    bilateral cataract extraction w/ IOL (Dr. Celene Squibb), retina surgery  . JOINT REPLACEMENT    . LUMBAR LAMINECTOMY/DECOMPRESSION MICRODISCECTOMY  09/04/2016   L3-4  . TOTAL HIP ARTHROPLASTY Right 10/2003    REVIEW OF SYSTEMS:  A comprehensive review of systems was negative.   PHYSICAL EXAMINATION: General appearance: alert, cooperative and no distress Head: Normocephalic, without obvious abnormality, atraumatic Neck: no adenopathy, no JVD, supple, symmetrical, trachea midline and thyroid not enlarged, symmetric, no tenderness/mass/nodules Lymph nodes: Cervical, supraclavicular, and axillary nodes normal. Resp: clear to auscultation bilaterally Back: symmetric, no curvature. ROM normal. No CVA tenderness. Cardio: regular rate and rhythm, S1, S2 normal, no murmur, click, rub or gallop GI: soft, non-tender; bowel sounds normal; no masses,  no organomegaly Extremities: extremities normal, atraumatic, no cyanosis or edema  ECOG PERFORMANCE STATUS: 0 - Asymptomatic  Blood pressure (!) 160/76, pulse 70, temperature 97.7 F (36.5 C), resp. rate 20, weight 169 lb 1.6 oz (76.7 kg), SpO2 99 %.  LABORATORY DATA: Lab Results  Component Value Date   WBC 8.8 06/04/2017   HGB 13.8 06/04/2017   HCT 41.4 06/04/2017   MCV 92.9 06/04/2017   PLT 180 06/04/2017      Chemistry      Component Value Date/Time   NA 141 09/04/2016 1122   NA 140 05/26/2016 1018   K 3.8 09/04/2016 1122   K 4.2 05/26/2016 1018   CL 107 09/04/2016 1122   CL 108 (H) 05/23/2013 1514   CO2 24 09/04/2016 1122   CO2 25 05/26/2016 1018   BUN 11 09/04/2016 1122   BUN 17.1 05/26/2016 1018   CREATININE 0.75 09/04/2016 1122   CREATININE 0.8 05/26/2016 1018      Component Value Date/Time   CALCIUM 9.5 09/04/2016 1122   CALCIUM 9.4 05/26/2016 1018   ALKPHOS 73 05/26/2016 1018   AST 19 05/26/2016 1018   ALT 16 05/26/2016 1018   BILITOT 0.43 05/26/2016 1018       RADIOGRAPHIC STUDIES: No results found.  ASSESSMENT AND  PLAN:  This is a very pleasant 76 years old white male with history of monoclonal gammopathy as well as thrombocytopenia he is currently on observation. His platelets count are within the normal range. His myeloma panel showed no evidence for disease progression. I discussed the lab result with the patient and recommended for him to continue on observation with repeat myeloma panel in one year. For the back pain, he had vertebroplasty to T4-5 but he developed compression fracture of T6. For hypertension, the patient will continue on his current blood pressure medication and he was advised to monitor his blood pressure closely at home. He was advised to call immediately if he has  any concerning symptoms in the interval. The patient voices understanding of current disease status and treatment options and is in agreement with the current care plan. All questions were answered. The patient knows to call the clinic with any problems, questions or concerns. We can certainly see the patient much sooner if necessary. I spent 10 minutes counseling the patient face to face. The total time spent in the appointment was 15 minutes.  Disclaimer: This note was dictated with voice recognition software. Similar sounding words can inadvertently be transcribed and may not be corrected upon review.

## 2017-06-04 NOTE — Telephone Encounter (Signed)
Scheduled appt per 6/28 los. - per MM said its suppose to be one year lab and f/u. Gave patient AVS and calender per los.

## 2017-06-05 ENCOUNTER — Encounter: Payer: Self-pay | Admitting: Internal Medicine

## 2017-06-05 LAB — KAPPA/LAMBDA LIGHT CHAINS
IG KAPPA FREE LIGHT CHAIN: 12.2 mg/L (ref 3.3–19.4)
IG LAMBDA FREE LIGHT CHAIN: 408.9 mg/L — AB (ref 5.7–26.3)
Kappa/Lambda FluidC Ratio: 0.03 — ABNORMAL LOW (ref 0.26–1.65)

## 2017-06-05 LAB — BETA 2 MICROGLOBULIN, SERUM: Beta-2: 1.7 mg/L (ref 0.6–2.4)

## 2017-06-17 DIAGNOSIS — G894 Chronic pain syndrome: Secondary | ICD-10-CM | POA: Diagnosis not present

## 2017-06-29 ENCOUNTER — Ambulatory Visit (AMBULATORY_SURGERY_CENTER): Payer: Self-pay

## 2017-06-29 VITALS — Ht 69.0 in | Wt 172.0 lb

## 2017-06-29 DIAGNOSIS — Z860100 Personal history of colon polyps, unspecified: Secondary | ICD-10-CM

## 2017-06-29 MED ORDER — SUPREP BOWEL PREP KIT 17.5-3.13-1.6 GM/177ML PO SOLN: 1.0000 | Freq: Once | ORAL | 0 refills | Status: AC

## 2017-06-29 NOTE — Progress Notes (Signed)
No allergies to eggs or soy No past problems with anesthesia No diet meds No home oxygen  Declined emmi 

## 2017-06-30 ENCOUNTER — Encounter: Payer: Self-pay | Admitting: Gastroenterology

## 2017-07-13 ENCOUNTER — Encounter (HOSPITAL_COMMUNITY)
Admission: RE | Disposition: A | Discharge status: Home / Self Care | Payer: Self-pay | Source: Ambulatory Visit | Attending: Gastroenterology

## 2017-07-13 ENCOUNTER — Encounter: Payer: PPO | Admitting: Gastroenterology

## 2017-07-13 ENCOUNTER — Encounter (HOSPITAL_COMMUNITY): Payer: Self-pay

## 2017-07-13 ENCOUNTER — Ambulatory Visit (HOSPITAL_COMMUNITY)
Admission: RE | Admit: 2017-07-13 | Discharge: 2017-07-13 | Disposition: A | Discharge status: Home / Self Care | Payer: PPO | Source: Ambulatory Visit | Attending: Gastroenterology | Admitting: Gastroenterology

## 2017-07-13 DIAGNOSIS — I252 Old myocardial infarction: Secondary | ICD-10-CM | POA: Insufficient documentation

## 2017-07-13 DIAGNOSIS — D120 Benign neoplasm of cecum: Secondary | ICD-10-CM | POA: Diagnosis not present

## 2017-07-13 DIAGNOSIS — Z1211 Encounter for screening for malignant neoplasm of colon: Secondary | ICD-10-CM | POA: Insufficient documentation

## 2017-07-13 DIAGNOSIS — I10 Essential (primary) hypertension: Secondary | ICD-10-CM | POA: Diagnosis not present

## 2017-07-13 DIAGNOSIS — K5520 Angiodysplasia of colon without hemorrhage: Secondary | ICD-10-CM | POA: Diagnosis not present

## 2017-07-13 DIAGNOSIS — D127 Benign neoplasm of rectosigmoid junction: Secondary | ICD-10-CM | POA: Insufficient documentation

## 2017-07-13 DIAGNOSIS — F17210 Nicotine dependence, cigarettes, uncomplicated: Secondary | ICD-10-CM | POA: Insufficient documentation

## 2017-07-13 DIAGNOSIS — K648 Other hemorrhoids: Secondary | ICD-10-CM | POA: Diagnosis not present

## 2017-07-13 DIAGNOSIS — D125 Benign neoplasm of sigmoid colon: Secondary | ICD-10-CM | POA: Diagnosis not present

## 2017-07-13 DIAGNOSIS — Z7951 Long term (current) use of inhaled steroids: Secondary | ICD-10-CM | POA: Insufficient documentation

## 2017-07-13 DIAGNOSIS — I2510 Atherosclerotic heart disease of native coronary artery without angina pectoris: Secondary | ICD-10-CM | POA: Diagnosis not present

## 2017-07-13 DIAGNOSIS — Z7982 Long term (current) use of aspirin: Secondary | ICD-10-CM | POA: Diagnosis not present

## 2017-07-13 DIAGNOSIS — K219 Gastro-esophageal reflux disease without esophagitis: Secondary | ICD-10-CM | POA: Insufficient documentation

## 2017-07-13 DIAGNOSIS — D122 Benign neoplasm of ascending colon: Secondary | ICD-10-CM | POA: Diagnosis not present

## 2017-07-13 DIAGNOSIS — Z96641 Presence of right artificial hip joint: Secondary | ICD-10-CM | POA: Insufficient documentation

## 2017-07-13 DIAGNOSIS — Z951 Presence of aortocoronary bypass graft: Secondary | ICD-10-CM | POA: Insufficient documentation

## 2017-07-13 DIAGNOSIS — Z86010 Personal history of colonic polyps: Secondary | ICD-10-CM | POA: Diagnosis not present

## 2017-07-13 DIAGNOSIS — Z79899 Other long term (current) drug therapy: Secondary | ICD-10-CM | POA: Insufficient documentation

## 2017-07-13 DIAGNOSIS — D123 Benign neoplasm of transverse colon: Secondary | ICD-10-CM | POA: Insufficient documentation

## 2017-07-13 DIAGNOSIS — E785 Hyperlipidemia, unspecified: Secondary | ICD-10-CM | POA: Diagnosis not present

## 2017-07-13 HISTORY — PX: COLONOSCOPY WITH PROPOFOL: SHX5780

## 2017-07-13 SURGERY — COLONOSCOPY WITH PROPOFOL
Anesthesia: Moderate Sedation

## 2017-07-13 SURGERY — COLONOSCOPY
Anesthesia: Moderate Sedation

## 2017-07-13 MED ORDER — SODIUM CHLORIDE 0.9 % IV SOLN: INTRAVENOUS | Status: DC

## 2017-07-13 MED ORDER — MIDAZOLAM HCL 2 MG/2ML IJ SOLN
INTRAMUSCULAR | Status: DC | PRN
  Administered 2017-07-13: 1 mg via INTRAVENOUS
  Administered 2017-07-13 (×2): 2 mg via INTRAVENOUS
  Administered 2017-07-13: 1 mg via INTRAVENOUS

## 2017-07-13 MED ORDER — FENTANYL CITRATE (PF) 100 MCG/2ML IJ SOLN
INTRAMUSCULAR | Status: DC | PRN
  Administered 2017-07-13 (×3): 25 ug via INTRAVENOUS

## 2017-07-13 MED ORDER — FENTANYL CITRATE (PF) 100 MCG/2ML IJ SOLN
INTRAMUSCULAR | Status: AC
  Filled 2017-07-13: qty 2

## 2017-07-13 MED ORDER — MIDAZOLAM HCL 5 MG/ML IJ SOLN
INTRAMUSCULAR | Status: AC
  Filled 2017-07-13: qty 2

## 2017-07-13 MED ORDER — LACTATED RINGERS IV SOLN
INTRAVENOUS | Status: DC
  Administered 2017-07-13: 1000 mL via INTRAVENOUS

## 2017-07-13 SURGICAL SUPPLY — 22 items

## 2017-07-13 NOTE — Discharge Instructions (Signed)
YOU HAD AN ENDOSCOPIC PROCEDURE TODAY: Refer to the procedure report and other information in the discharge instructions given to you for any specific questions about what was found during the examination. If this information does not answer your questions, please call [place] office at [phone] to clarify.   YOU SHOULD EXPECT: Some feelings of bloating in the abdomen. Passage of more gas than usual. Walking can help get rid of the air that was put into your GI tract during the procedure and reduce the bloating. If you had a lower endoscopy (such as a colonoscopy or flexible sigmoidoscopy) you may notice spotting of blood in your stool or on the toilet paper. Some abdominal soreness may be present for a day or two, also.  DIET: Your first meal following the procedure should be a light meal and then it is ok to progress to your normal diet. A half-sandwich or bowl of soup is an example of a good first meal. Heavy or fried foods are harder to digest and may make you feel nauseous or bloated. Drink plenty of fluids but you should avoid alcoholic beverages for 24 hours. If you had a esophageal dilation, please see attached instructions for diet.    ACTIVITY: Your care partner should take you home directly after the procedure. You should plan to take it easy, moving slowly for the rest of the day. You can resume normal activity the day after the procedure however YOU SHOULD NOT DRIVE, use power tools, machinery or perform tasks that involve climbing or major physical exertion for 24 hours (because of the sedation medicines used during the test).   SYMPTOMS TO REPORT IMMEDIATELY: A gastroenterologist can be reached at any hour. Please call [phone]  for any of the following symptoms:  Following lower endoscopy (colonoscopy, flexible sigmoidoscopy) Excessive amounts of blood in the stool  Significant tenderness, worsening of abdominal pains  Swelling of the abdomen that is new, acute  Fever of 100 or  higher    FOLLOW UP:  If any biopsies were taken you will be contacted by phone or by letter within the next 1-3 weeks. Call [phone]  if you have not heard about the biopsies in 3 weeks.  Please also call with any specific questions about appointments or follow up tests.   Moderate Conscious Sedation, Adult, Care After These instructions provide you with information about caring for yourself after your procedure. Your health care provider may also give you more specific instructions. Your treatment has been planned according to current medical practices, but problems sometimes occur. Call your health care provider if you have any problems or questions after your procedure. What can I expect after the procedure? After your procedure, it is common:  To feel sleepy for several hours.  To feel clumsy and have poor balance for several hours.  To have poor judgment for several hours.  To vomit if you eat too soon.  Follow these instructions at home: For at least 24 hours after the procedure:   Do not: ? Participate in activities where you could fall or become injured. ? Drive. ? Use heavy machinery. ? Drink alcohol. ? Take sleeping pills or medicines that cause drowsiness. ? Make important decisions or sign legal documents. ? Take care of children on your own.  Rest. Eating and drinking  Follow the diet recommended by your health care provider.  If you vomit: ? Drink water, juice, or soup when you can drink without vomiting. ? Make sure you have little or  no nausea before eating solid foods. General instructions  Have a responsible adult stay with you until you are awake and alert.  Take over-the-counter and prescription medicines only as told by your health care provider.  If you smoke, do not smoke without supervision.  Keep all follow-up visits as told by your health care provider. This is important. Contact a health care provider if:  You keep feeling nauseous  or you keep vomiting.  You feel light-headed.  You develop a rash.  You have a fever. Get help right away if:  You have trouble breathing. This information is not intended to replace advice given to you by your health care provider. Make sure you discuss any questions you have with your health care provider. Document Released: 09/14/2013 Document Revised: 04/28/2016 Document Reviewed: 03/15/2016 Elsevier Interactive Patient Education  [employer].

## 2017-07-13 NOTE — Interval H&P Note (Signed)
History and Physical Interval Note:  07/13/2017 2:02 PM  [name] [name]  has presented today for surgery, with the diagnosis of screening  The various methods of treatment have been discussed with the patient and family. After consideration of risks, benefits and other options for treatment, the patient has consented to  Procedure(s): COLONOSCOPY WITH PROPOFOL (N/A) as a surgical intervention .  The patient's history has been reviewed, patient examined, no change in status, stable for surgery.  I have reviewed the patient's chart and labs.  Questions were answered to the patient's satisfaction.     Renelda Loma [name] [name]

## 2017-07-13 NOTE — Interval H&P Note (Signed)
History and Physical Interval Note:  07/13/2017 2:02 PM  [name] [name]  has presented today for surgery, with the diagnosis of screening  The various methods of treatment have been discussed with the patient and family. After consideration of risks, benefits and other options for treatment, the patient has consented to  Procedure(s): COLONOSCOPY WITH PROPOFOL (N/A) as a surgical intervention .  The patient's history has been reviewed, patient examined, no change in status, stable for surgery.  I have reviewed the patient's chart and labs.  Questions were answered to the patient's satisfaction.     Renelda Loma [name]

## 2017-07-13 NOTE — H&P (Signed)
HPI :  76 y/o male with a history of CAD s/p CABG, history of multiple colon adenomas in 2014, here for surveillance colonoscopy. He has no complaints today.  Past Medical History:  Diagnosis Date  . AAA (abdominal aortic aneurysm) ([place])   . Back pain    10/2010  . Back pain    November, 2013  . Back pain    decompression belt bought off of TV  . CAD (coronary artery disease)   . Carotid artery disease ([place])    Doppler, November, 2013, 0-39% bilateral  . Carotid bruit    November, 2013  . Cataract   . DDD (degenerative disc disease), lumbar   . Dyslipidemia   . Ejection fraction    EF 55%, nuclear 2009  . GERD (gastroesophageal reflux disease)   . HTN (hypertension)   . Hx of CABG    2004  . 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 Hypertension   . Kidney stones   . MGUS (monoclonal gammopathy of unknown significance)   . Myocardial infarction ([place])   . RBBB (right bundle branch block with left anterior fascicular block)   . Spinal stenosis, lumbar    3-4  . Temporary low platelet count ([place])   . Thrombocytopenia ([place])    Dr. Ralene Ok     Past Surgical History:  Procedure Laterality Date  . BACK SURGERY    . COLONOSCOPY    . CORONARY ARTERY BYPASS GRAFT  2004  . DECOMPRESSIVE LUMBAR LAMINECTOMY LEVEL 1 N/A 09/04/2016   Procedure: DECOMPRESSION L3 - L4;  Surgeon: Melina Schools, MD;  Location: [facility];  Service: Orthopedics;  Laterality: N/A;  . EYE SURGERY  2013   bilateral cataract extraction w/ IOL (Dr. Celene Squibb), retina surgery  . JOINT REPLACEMENT    . LUMBAR LAMINECTOMY/DECOMPRESSION MICRODISCECTOMY  09/04/2016   L3-4  . TOTAL HIP ARTHROPLASTY Right 10/2003   Family History  Problem Relation Age of Onset  . Kidney disease Mother   . COPD Mother   . Stroke Father        cerebral hemorrhage  . Heart disease Brother   . Heart disease Brother   . Arthritis Brother   . Cancer Paternal Grandfather    Social History  Substance  Use Topics  . Smoking status: Heavy Tobacco Smoker    Packs/day: 0.50    Years: 45.00    Types: Cigarettes  . Smokeless tobacco: Never Used     Comment: half pack day  . Alcohol use No   Current Facility-Administered Medications  Medication Dose Route Frequency Provider Last Rate Last Dose  . 0.9 %  sodium chloride infusion   Intravenous Continuous Cameron Katayama, Renelda Loma, MD       Allergies  Allergen Reactions  . Penicillins Other (See Comments)    Has patient had a PCN reaction causing immediate rash, facial/tongue/throat swelling, SOB or lightheadedness with hypotension: Yes Has patient had a PCN reaction causing severe rash involving mucus membranes or skin necrosis: No Has patient had a PCN reaction that required hospitalization No Has patient had a PCN reaction occurring within the last 10 years: No If all of the above answers are "NO", then may proceed with Cephalosporin use.  Passed out     Review of Systems: All systems reviewed and negative except where noted in HPI.   Lab Results  Component Value Date   WBC 8.8 06/04/2017   HGB 13.8 06/04/2017  HCT 41.4 06/04/2017   MCV 92.9 06/04/2017   PLT 180 06/04/2017    Lab Results  Component Value Date   CREATININE 0.8 06/04/2017   BUN 12.0 06/04/2017   NA 139 06/04/2017   K 5.1 06/04/2017   CL 107 09/04/2016   CO2 26 06/04/2017    Lab Results  Component Value Date   ALT 16 06/04/2017   AST 18 06/04/2017   ALKPHOS 77 06/04/2017   BILITOT 0.44 06/04/2017     Physical Exam: There were no vitals taken for this visit. Vitals pending Constitutional: Pleasant,well-developed, male in no acute distress.  Cardiovascular: Normal rate, regular rhythm.  Pulmonary/chest: Effort normal and breath sounds normal. No wheezing, rales or rhonchi. Abdominal: Soft, nondistended, nontender.  There are no masses palpable. No hepatomegaly. Extremities: no edema   ASSESSMENT AND PLAN: 76 y/o male with history as outlined  above, here for surveillance colonoscopy. I have discussed risks / benefits of colonoscopy with him and he wanted to proceed. Further recommendations pending the results. He agreed with the plan, all questions answered.   Stratford Cellar, MD [facility] Pager [phone]

## 2017-07-13 NOTE — Op Note (Signed)
[facility] Patient Name: [name] [name] Procedure Date: 07/13/2017 MRN: [id number] Attending MD: Carlota Raspberry. Heath Tesler MD, MD Date of Birth: 05-19-1941 CSN: [id number] Age: 76 Admit Type: Outpatient Procedure:                Colonoscopy Indications:              Surveillance: Personal history of adenomatous                            polyps on last colonoscopy > 3 years ago Providers:                Remo Lipps P. Florentino Laabs MD, MD, Burtis Junes, RN, Corliss Parish, Technician Referring MD:              Medicines:                Fentanyl 75 micrograms IV, Midazolam 6 mg IV Complications:            No immediate complications. Estimated blood loss:                            Minimal. Estimated Blood Loss:     Estimated blood loss was minimal. Procedure:                Pre-Anesthesia Assessment:                           - Prior to the procedure, a History and Physical                            was performed, and patient medications and                            allergies were reviewed. The patient's tolerance of                            previous anesthesia was also reviewed. The risks                            and benefits of the procedure and the sedation                            options and risks were discussed with the patient.                            All questions were answered, and informed consent                            was obtained. Prior Anticoagulants: The patient has                            taken aspirin, last dose was 1 day prior to  procedure. ASA Grade Assessment: II - A patient                            with mild systemic disease. After reviewing the                            risks and benefits, the patient was deemed in                            satisfactory condition to undergo the procedure.                           After obtaining informed consent, the colonoscope                             was passed under direct vision. Throughout the                            procedure, the patient's blood pressure, pulse, and                            oxygen saturations were monitored continuously. The                            EC-[id number] ([id number]) scope was introduced through                            the anus and advanced to the the terminal ileum,                            with identification of the appendiceal orifice and                            IC valve. The colonoscopy was performed without                            difficulty. The patient tolerated the procedure                            well. The quality of the bowel preparation was                            adequate. The terminal ileum, ileocecal valve,                            appendiceal orifice, and rectum were photographed. Scope In: 3:04:16 PM Scope Out: 3:41:35 PM Scope Withdrawal Time: 0 hours 27 minutes 12 seconds  Total Procedure Duration: 0 hours 37 minutes 19 seconds  Findings:      The perianal and digital rectal examinations were normal.      The terminal ileum appeared normal.      Three angiodysplastic lesions without bleeding were found in the cecum.       They were not treated given no bleeding symptoms or history of anemia.  A 4 mm polyp was found in the cecum. The polyp was sessile. The polyp       was removed with a cold snare. Resection and retrieval were complete.      Six sessile polyps were found in the ascending colon. The polyps were 4       to 10 mm in size. These polyps were removed with a cold snare. Resection       and retrieval were complete.      Six sessile polyps were found in the transverse colon. The polyps were 4       to 8 mm in size. These polyps were removed with a cold snare. Resection       and retrieval were complete.      A 6 mm polyp was found in the sigmoid colon. The polyp was sessile. The       polyp was removed with a cold snare. Resection and retrieval were        complete.      A 5 mm polyp was found in the recto-sigmoid colon. The polyp was       sessile. The polyp was removed with a cold snare. Resection and       retrieval were complete.      Internal hemorrhoids were found during retroflexion.      The exam was otherwise without abnormality. Impression:               - Three non-bleeding colonic angiodysplastic                            lesions.                           - One 4 mm polyp in the cecum, removed with a cold                            snare. Resected and retrieved.                           - Six 4 to 10 mm polyps in the ascending colon,                            removed with a cold snare. Resected and retrieved.                           - Six 4 to 8 mm polyps in the transverse colon,                            removed with a cold snare. Resected and retrieved.                           - One 6 mm polyp in the sigmoid colon, removed with                            a cold snare. Resected and retrieved.                           - One 5 mm  polyp at the recto-sigmoid colon,                            removed with a cold snare. Resected and retrieved.                           - Internal hemorrhoids.                           - The examination was otherwise normal. Moderate Sedation:      Moderate (conscious) sedation was administered by the endoscopy nurse       and supervised by the endoscopist. The following parameters were       monitored: oxygen saturation, heart rate, blood pressure, and response       to care. Total physician intraservice time was 39 minutes. Recommendation:           - Patient has a contact number available for                            emergencies. The signs and symptoms of potential                            delayed complications were discussed with the                            patient. Return to normal activities tomorrow.                            Written discharge instructions were  provided to the                            patient.                           - Resume previous diet.                           - Continue present medications.                           - Await pathology results.                           - Repeat colonoscopy is recommended for                            surveillance. The colonoscopy date will be                            determined after pathology results from today's                            exam become available for review.                           - No ibuprofen, naproxen, or other non-steroidal  anti-inflammatory drugs for 2 weeks after polyp                            removal. Procedure Code(s):        --- Professional ---                           [phone], Colonoscopy, flexible; with removal of                            tumor(s), polyp(s), or other lesion(s) by snare                            technique                           99152, Moderate sedation services provided by the                            same physician or other qualified health care                            professional performing the diagnostic or                            therapeutic service that the sedation supports,                            requiring the presence of an independent trained                            observer to assist in the monitoring of the                            patient's level of consciousness and physiological                            status; initial 15 minutes of intraservice time,                            patient age 44 years or older                           [phone], Moderate sedation services; each additional                            15 minutes intraservice time                           99153, Moderate sedation services; each additional                            15 minutes intraservice time Diagnosis Code(s):        --- Professional ---  Z86.010, Personal history of  colonic polyps                           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 All rights reserved. The codes documented in this report are preliminary and upon coder review may  be revised to meet current compliance requirements. Remo Lipps P. Fitzhugh Vizcarrondo MD, MD 07/13/2017 3:50:57 PM This report has been signed electronically. Number of Addenda: 0

## 2017-07-14 ENCOUNTER — Encounter: Payer: Self-pay | Admitting: Gastroenterology

## 2017-07-14 ENCOUNTER — Encounter (HOSPITAL_COMMUNITY): Payer: Self-pay | Admitting: Gastroenterology

## 2017-07-28 ENCOUNTER — Ambulatory Visit: Payer: PPO | Admitting: Vascular Surgery

## 2017-07-28 ENCOUNTER — Other Ambulatory Visit (HOSPITAL_COMMUNITY): Payer: PPO

## 2017-08-05 DIAGNOSIS — M961 Postlaminectomy syndrome, not elsewhere classified: Secondary | ICD-10-CM | POA: Diagnosis not present

## 2017-08-11 ENCOUNTER — Ambulatory Visit (INDEPENDENT_AMBULATORY_CARE_PROVIDER_SITE_OTHER): Payer: PPO | Admitting: Urology

## 2017-08-11 DIAGNOSIS — N401 Enlarged prostate with lower urinary tract symptoms: Secondary | ICD-10-CM | POA: Diagnosis not present

## 2017-08-11 DIAGNOSIS — R351 Nocturia: Secondary | ICD-10-CM | POA: Diagnosis not present

## 2017-08-31 ENCOUNTER — Encounter: Payer: Self-pay | Admitting: Nurse Practitioner

## 2017-08-31 ENCOUNTER — Ambulatory Visit (INDEPENDENT_AMBULATORY_CARE_PROVIDER_SITE_OTHER): Payer: PPO | Admitting: Nurse Practitioner

## 2017-08-31 VITALS — BP 138/82 | HR 71 | Temp 97.8°F | Ht 69.0 in | Wt 169.0 lb

## 2017-08-31 DIAGNOSIS — J209 Acute bronchitis, unspecified: Secondary | ICD-10-CM

## 2017-08-31 DIAGNOSIS — H60503 Unspecified acute noninfective otitis externa, bilateral: Secondary | ICD-10-CM | POA: Diagnosis not present

## 2017-08-31 MED ORDER — NEOMYCIN-POLYMYXIN-HC 3.5-10000-1 OT SUSP: 3.0000 [drp] | Freq: Three times a day (TID) | OTIC | 0 refills | Status: DC

## 2017-08-31 MED ORDER — ALBUTEROL SULFATE HFA 108 (90 BASE) MCG/ACT IN AERS: 1.0000 | INHALATION_SPRAY | Freq: Four times a day (QID) | RESPIRATORY_TRACT | 0 refills | Status: DC | PRN

## 2017-08-31 MED ORDER — PREDNISONE 10 MG (21) PO TBPK: ORAL_TABLET | ORAL | 0 refills | Status: DC

## 2017-08-31 MED ORDER — BENZONATATE 100 MG PO CAPS
100.0000 mg | ORAL_CAPSULE | Freq: Three times a day (TID) | ORAL | 0 refills | Status: DC | PRN
Start: 2017-08-31 — End: 2018-05-14

## 2017-08-31 MED ORDER — LEVOFLOXACIN 500 MG PO TABS: 500.0000 mg | ORAL_TABLET | Freq: Every day | ORAL | 0 refills | Status: DC

## 2017-08-31 MED ORDER — DM-GUAIFENESIN ER 30-600 MG PO TB12: 1.0000 | ORAL_TABLET | Freq: Two times a day (BID) | ORAL | 0 refills | Status: DC | PRN

## 2017-08-31 NOTE — Patient Instructions (Signed)
Return to office if no improvement by Friday

## 2017-08-31 NOTE — Progress Notes (Signed)
Subjective:  Patient ID: ILYAAS [name], male    DOB: [birth year]  Age: 76 y.o. MRN: [id number]  CC: Cough (coughing,SOB,wheezing- 1 wk. )   Cough  This is a new problem. The current episode started in the past 7 days. The problem has been gradually worsening. The cough is productive of purulent sputum. Associated symptoms include ear congestion, ear pain, nasal congestion, postnasal drip, shortness of breath and wheezing. Pertinent negatives include no chest pain, chills or rhinorrhea. The symptoms are aggravated by lying down. Risk factors for lung disease include smoking/tobacco exposure. He has tried OTC cough suppressant for the symptoms. The treatment provided no relief. His past medical history is significant for bronchitis and COPD.    Outpatient Medications Prior to Visit  Medication Sig Dispense Refill  . aspirin 81 MG tablet Take 1 tablet (81 mg total) by mouth daily. 90 tablet 3  . diazepam (VALIUM) 10 MG tablet Take 1 tablet (10 mg total) by mouth every 12 (twelve) hours as needed for anxiety. 30 tablet 0  . esomeprazole (NEXIUM) 20 MG capsule Take 20 mg by mouth daily at 12 noon.    . finasteride (PROSCAR) 5 MG tablet Take 5 mg by mouth daily.    . fluticasone (FLONASE) 50 MCG/ACT nasal spray Place 2 sprays into both nostrils daily. 16 g 0  . HYDROcodone-acetaminophen (NORCO) 10-325 MG tablet Take 1 tablet by mouth every 6 (six) hours as needed. 60 tablet 0  . lisinopril (PRINIVIL,ZESTRIL) 10 MG tablet TAKE 1 (10 MG TOTAL) BY MOUTH DAILY 90 tablet 3  . metoprolol (LOPRESSOR) 50 MG tablet Take 1 tablet (50 mg total) by mouth 2 (two) times daily. 180 tablet 3  . Multiple Vitamins-Minerals (CENTRUM SILVER ULTRA MENS PO) Take by mouth daily.      . nitroGLYCERIN (NITROSTAT) 0.4 MG SL tablet Place 1 tablet (0.4 mg total) under the tongue every 5 (five) minutes as needed for chest pain. 25 tablet 3  . ondansetron (ZOFRAN) 4 MG tablet Take 1 tablet (4 mg total) by mouth every 8  (eight) hours as needed for nausea or vomiting. 20 tablet 0  . simvastatin (ZOCOR) 40 MG tablet Take 1 tablet (40 mg total) by mouth at bedtime. 90 tablet 3  . tamsulosin (FLOMAX) 0.4 MG CAPS capsule Take 0.4 mg by mouth 2 (two) times daily.   2  . traMADol (ULTRAM) 50 MG tablet Take 1 tablet by mouth 3 (three) times daily as needed.    . neomycin-polymyxin-hydrocortisone (CORTISPORIN) OTIC solution Place 4 drops into the left ear 3 (three) times daily. 10 mL 0   No facility-administered medications prior to visit.     ROS See HPI  Objective:  BP 138/82   Pulse 71   Temp 97.8 F (36.6 C)   Ht 5\' 9"  (1.753 m)   Wt 169 lb (76.7 kg)   SpO2 97%   BMI 24.96 kg/m   BP Readings from Last 3 Encounters:  08/31/17 138/82  07/13/17 137/82  06/04/17 (!) 160/76    Wt Readings from Last 3 Encounters:  08/31/17 169 lb (76.7 kg)  07/13/17 170 lb (77.1 kg)  06/29/17 172 lb (78 kg)    Physical Exam  Constitutional: He is oriented to person, place, and time. No distress.  HENT:  Right Ear: There is drainage. No mastoid tenderness. Tympanic membrane is injected. A middle ear effusion is present.  Left Ear: There is drainage. No mastoid tenderness. Tympanic membrane is injected. A middle ear effusion  is present.  Neck: No JVD present.  Cardiovascular: Normal rate.   Pulmonary/Chest: Effort normal. No respiratory distress. He has wheezes. He has rales.  Musculoskeletal: He exhibits no edema.  Neurological: He is alert and oriented to person, place, and time.  Vitals reviewed.   Lab Results  Component Value Date   WBC 8.8 06/04/2017   HGB 13.8 06/04/2017   HCT 41.4 06/04/2017   PLT 180 06/04/2017   GLUCOSE 101 06/04/2017   CHOL 142 01/31/2016   TRIG 77.0 01/31/2016   HDL 48.50 01/31/2016   LDLCALC 78 01/31/2016   ALT 16 06/04/2017   AST 18 06/04/2017   NA 139 06/04/2017   K 5.1 06/04/2017   CL 107 09/04/2016   CREATININE 0.8 06/04/2017   BUN 12.0 06/04/2017   CO2 26  06/04/2017    No results found.  Assessment & Plan:   [name] [name] was seen today for cough.  Diagnoses and all orders for this visit:  Acute bronchitis, unspecified organism -     predniSONE (STERAPRED UNI-PAK 21 TAB) 10 MG (21) TBPK tablet; Take by mouth as directed. -     dextromethorphan-guaiFENesin (MUCINEX DM) 30-600 MG 12hr tablet; Take 1 tablet by mouth 2 (two) times daily as needed for cough. -     albuterol (PROVENTIL HFA;VENTOLIN HFA) 108 (90 Base) MCG/ACT inhaler; Inhale 1-2 puffs into the lungs every 6 (six) hours as needed for wheezing or shortness of breath. -     levofloxacin (LEVAQUIN) 500 MG tablet; Take 1 tablet (500 mg total) by mouth daily. -     benzonatate (TESSALON) 100 MG capsule; Take 1 capsule (100 mg total) by mouth 3 (three) times daily as needed for cough.  Acute otitis externa of both ears, unspecified type -     neomycin-polymyxin-hydrocortisone (CORTISPORIN) 3.5-10000-1 OTIC suspension; Place 3 drops into both ears 3 (three) times daily.   I have changed [name] [name]'s neomycin-polymyxin-hydrocortisone. I am also having him start on predniSONE, dextromethorphan-guaiFENesin, albuterol, levofloxacin, and benzonatate. Additionally, I am having him maintain his Multiple Vitamins-Minerals (CENTRUM SILVER ULTRA MENS PO), finasteride, esomeprazole, tamsulosin, HYDROcodone-acetaminophen, ondansetron, traMADol, aspirin, lisinopril, metoprolol tartrate, simvastatin, diazepam, nitroGLYCERIN, and fluticasone.  Meds ordered this encounter  Medications  . predniSONE (STERAPRED UNI-PAK 21 TAB) 10 MG (21) TBPK tablet    Sig: Take by mouth as directed.    Dispense:  21 tablet    Refill:  0    Order Specific Question:   Supervising Provider    Answer:   Cassandria Anger [[id number]]  . dextromethorphan-guaiFENesin (MUCINEX DM) 30-600 MG 12hr tablet    Sig: Take 1 tablet by mouth 2 (two) times daily as needed for cough.    Dispense:  14 tablet    Refill:  0    Order Specific  Question:   Supervising Provider    Answer:   Cassandria Anger [[id number]]  . albuterol (PROVENTIL HFA;VENTOLIN HFA) 108 (90 Base) MCG/ACT inhaler    Sig: Inhale 1-2 puffs into the lungs every 6 (six) hours as needed for wheezing or shortness of breath.    Dispense:  1 Inhaler    Refill:  0    Order Specific Question:   Supervising Provider    Answer:   Cassandria Anger [[id number]]  . levofloxacin (LEVAQUIN) 500 MG tablet    Sig: Take 1 tablet (500 mg total) by mouth daily.    Dispense:  7 tablet    Refill:  0    Order Specific Question:  Supervising Provider    Answer:   Cassandria Anger [[id number]]  . benzonatate (TESSALON) 100 MG capsule    Sig: Take 1 capsule (100 mg total) by mouth 3 (three) times daily as needed for cough.    Dispense:  30 capsule    Refill:  0    Order Specific Question:   Supervising Provider    Answer:   Cassandria Anger [[id number]]  . neomycin-polymyxin-hydrocortisone (CORTISPORIN) 3.5-10000-1 OTIC suspension    Sig: Place 3 drops into both ears 3 (three) times daily.    Dispense:  10 mL    Refill:  0    Order Specific Question:   Supervising Provider    Answer:   Cassandria Anger [[id number]]    Follow-up: Return if symptoms worsen or fail to improve.  Wilfred Lacy, NP

## 2017-09-08 ENCOUNTER — Ambulatory Visit (HOSPITAL_COMMUNITY)
Admission: RE | Admit: 2017-09-08 | Discharge: 2017-09-08 | Disposition: A | Discharge status: Home / Self Care | Payer: PPO | Source: Ambulatory Visit | Attending: Vascular Surgery | Admitting: Vascular Surgery

## 2017-09-08 ENCOUNTER — Ambulatory Visit (INDEPENDENT_AMBULATORY_CARE_PROVIDER_SITE_OTHER): Payer: PPO | Admitting: Vascular Surgery

## 2017-09-08 ENCOUNTER — Encounter: Payer: Self-pay | Admitting: Vascular Surgery

## 2017-09-08 VITALS — BP 116/80 | HR 67 | Temp 97.2°F | Resp 16 | Ht 70.0 in | Wt 166.0 lb

## 2017-09-08 DIAGNOSIS — I723 Aneurysm of iliac artery: Secondary | ICD-10-CM | POA: Insufficient documentation

## 2017-09-08 DIAGNOSIS — I714 Abdominal aortic aneurysm, without rupture, unspecified: Secondary | ICD-10-CM

## 2017-09-08 NOTE — Progress Notes (Signed)
[facility] [place]  Patient name: [name] [name] MRN: [id number] DOB: Aug 03, 1941 Sex: male  REASON FOR VISIT: Follow-up of asymptomatic abdominal aortic aneurysm  HPI: [name] [name] is a 76 y.o. male here today for follow-up. I'll see him one year ago when he had an incidental finding of an abdominal aortic aneurysm time of workup for degenerative disc disease in his back. He did have back surgery following my last visit with him and reports that he has had minimal improvement with this. He does walk with a cane but is not able to do many of the things that he enjoys doing. He does not have any new cardiac difficulty. He has no symptoms referable to his aneurysm  Past Medical History:  Diagnosis Date  . AAA (abdominal aortic aneurysm) ([place])   . Back pain    10/2010  . Back pain    November, 2013  . Back pain    decompression belt bought off of TV  . CAD (coronary artery disease)   . Carotid artery disease ([place])    Doppler, November, 2013, 0-39% bilateral  . Carotid bruit    November, 2013  . Cataract   . DDD (degenerative disc disease), lumbar   . Dyslipidemia   . Ejection fraction    EF 55%, nuclear 2009  . GERD (gastroesophageal reflux disease)   . HTN (hypertension)   . Hx of CABG    2004  . 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 Hypertension   . Kidney stones   . MGUS (monoclonal gammopathy of unknown significance)   . Myocardial infarction ([place])   . RBBB (right bundle branch block with left anterior fascicular block)   . Spinal stenosis, lumbar    3-4  . Temporary low platelet count ([place])   . Thrombocytopenia ([place])    Dr. Ralene Ok    Family History  Problem Relation Age of Onset  . Kidney disease Mother   . COPD Mother   . Stroke Father        cerebral hemorrhage  . Heart disease Brother   . Heart disease Brother   . Arthritis Brother   . Cancer Paternal  Grandfather     SOCIAL HISTORY: Social History  Substance Use Topics  . Smoking status: Heavy Tobacco Smoker    Packs/day: 0.50    Years: 45.00    Types: Cigarettes  . Smokeless tobacco: Never Used     Comment: half pack day  . Alcohol use No    Allergies  Allergen Reactions  . Penicillins Other (See Comments)    Has patient had a PCN reaction causing immediate rash, facial/tongue/throat swelling, SOB or lightheadedness with hypotension: Yes Has patient had a PCN reaction causing severe rash involving mucus membranes or skin necrosis: No Has patient had a PCN reaction that required hospitalization No Has patient had a PCN reaction occurring within the last 10 years: No If all of the above answers are "NO", then may proceed with Cephalosporin use.  Passed out    Current Outpatient Prescriptions  Medication Sig Dispense Refill  . albuterol (PROVENTIL HFA;VENTOLIN HFA) 108 (90 Base) MCG/ACT inhaler Inhale 1-2 puffs into the lungs every 6 (six) hours as needed for wheezing or shortness of breath. 1 Inhaler 0  . aspirin 81 MG tablet Take 1 tablet (81 mg total) by mouth daily. 90 tablet 3  . benzonatate (TESSALON) 100 MG capsule Take 1  capsule (100 mg total) by mouth 3 (three) times daily as needed for cough. 30 capsule 0  . dextromethorphan-guaiFENesin (MUCINEX DM) 30-600 MG 12hr tablet Take 1 tablet by mouth 2 (two) times daily as needed for cough. 14 tablet 0  . diazepam (VALIUM) 10 MG tablet Take 1 tablet (10 mg total) by mouth every 12 (twelve) hours as needed for anxiety. 30 tablet 0  . esomeprazole (NEXIUM) 20 MG capsule Take 20 mg by mouth daily at 12 noon.    . finasteride (PROSCAR) 5 MG tablet Take 5 mg by mouth daily.    . fluticasone (FLONASE) 50 MCG/ACT nasal spray Place 2 sprays into both nostrils daily. 16 g 0  . HYDROcodone-acetaminophen (NORCO) 10-325 MG tablet Take 1 tablet by mouth every 6 (six) hours as needed. 60 tablet 0  . levofloxacin (LEVAQUIN) 500 MG tablet  Take 1 tablet (500 mg total) by mouth daily. 7 tablet 0  . lisinopril (PRINIVIL,ZESTRIL) 10 MG tablet TAKE 1 (10 MG TOTAL) BY MOUTH DAILY 90 tablet 3  . metoprolol (LOPRESSOR) 50 MG tablet Take 1 tablet (50 mg total) by mouth 2 (two) times daily. 180 tablet 3  . Multiple Vitamins-Minerals (CENTRUM SILVER ULTRA MENS PO) Take by mouth daily.      Marland Kitchen neomycin-polymyxin-hydrocortisone (CORTISPORIN) 3.5-10000-1 OTIC suspension Place 3 drops into both ears 3 (three) times daily. 10 mL 0  . nitroGLYCERIN (NITROSTAT) 0.4 MG SL tablet Place 1 tablet (0.4 mg total) under the tongue every 5 (five) minutes as needed for chest pain. 25 tablet 3  . ondansetron (ZOFRAN) 4 MG tablet Take 1 tablet (4 mg total) by mouth every 8 (eight) hours as needed for nausea or vomiting. 20 tablet 0  . predniSONE (STERAPRED UNI-PAK 21 TAB) 10 MG (21) TBPK tablet Take by mouth as directed. 21 tablet 0  . simvastatin (ZOCOR) 40 MG tablet Take 1 tablet (40 mg total) by mouth at bedtime. 90 tablet 3  . tamsulosin (FLOMAX) 0.4 MG CAPS capsule Take 0.4 mg by mouth 2 (two) times daily.   2  . traMADol (ULTRAM) 50 MG tablet Take 1 tablet by mouth 3 (three) times daily as needed.     No current facility-administered medications for this visit.     REVIEW OF SYSTEMS:  [X]  denotes positive finding, [ ]  denotes negative finding Cardiac  Comments:  Chest pain or chest pressure:    Shortness of breath upon exertion:    Short of breath when lying flat:    Irregular heart rhythm:        Vascular    Pain in calf, thigh, or hip brought on by ambulation:    Pain in feet at night that wakes you up from your sleep:     Blood clot in your veins:    Leg swelling:           PHYSICAL EXAM: Vitals:   09/08/17 1029  BP: 116/80  Pulse: 67  Resp: 16  Temp: (!) 97.2 F (36.2 C)  TempSrc: Oral  SpO2: 98%  Weight: 166 lb (75.3 kg)  Height: 5\' 10"  (1.778 m)    GENERAL: The patient is a well-nourished male, in no acute distress. The  vital signs are documented above. CARDIOVASCULAR: 2+ radial 2+ dorsalis pedis pulses bilaterally. Carotid arteries without bruits bilaterally. Abdomen soft with no aneurysm palpable and no tenderness PULMONARY: There is good air exchange  MUSCULOSKELETAL: There are no major deformities or cyanosis. NEUROLOGIC: No focal weakness or paresthesias are detected. SKIN:  There are no ulcers or rashes noted. PSYCHIATRIC: The patient has a normal affect.  DATA:  Duplex today shows no change with a maximal diameter of 4.0 cm aneurysm  MEDICAL ISSUES: Discussed this at length with patient and his wife present. He understands symptoms of leaking abdominal aortic aneurysm. Splane this would be extremely unlikely that his small size. Have recommended continued yearly duplex ultrasound surveillance and explained that we would consider repair for rapid expansion or enlarged size. He will see [place] again in one year with repeat duplex follow-up    Rosetta Posner, MD [facility] Tel [phone] Pager [phone]

## 2017-09-09 NOTE — Addendum Note (Signed)
Addended by: Lianne Cure A on: 09/09/2017 10:34 AM   Modules accepted: Orders

## 2017-11-19 ENCOUNTER — Telehealth: Payer: Self-pay

## 2017-11-19 ENCOUNTER — Ambulatory Visit (INDEPENDENT_AMBULATORY_CARE_PROVIDER_SITE_OTHER): Payer: PPO

## 2017-11-19 DIAGNOSIS — J209 Acute bronchitis, unspecified: Secondary | ICD-10-CM

## 2017-11-19 DIAGNOSIS — Z23 Encounter for immunization: Secondary | ICD-10-CM | POA: Diagnosis not present

## 2017-11-19 DIAGNOSIS — H6502 Acute serous otitis media, left ear: Secondary | ICD-10-CM

## 2017-11-19 MED ORDER — ALBUTEROL SULFATE HFA 108 (90 BASE) MCG/ACT IN AERS: 1.0000 | INHALATION_SPRAY | Freq: Four times a day (QID) | RESPIRATORY_TRACT | 0 refills | Status: DC | PRN

## 2017-11-19 MED ORDER — FLUTICASONE PROPIONATE 50 MCG/ACT NA SUSP: 2.0000 | Freq: Every day | NASAL | 0 refills | Status: DC

## 2017-11-19 NOTE — Progress Notes (Signed)
f °

## 2017-11-19 NOTE — Telephone Encounter (Signed)
error 

## 2017-12-14 ENCOUNTER — Other Ambulatory Visit: Payer: Self-pay | Admitting: Internal Medicine

## 2017-12-14 DIAGNOSIS — J209 Acute bronchitis, unspecified: Secondary | ICD-10-CM

## 2017-12-17 ENCOUNTER — Other Ambulatory Visit: Payer: Self-pay | Admitting: Internal Medicine

## 2017-12-17 DIAGNOSIS — H6502 Acute serous otitis media, left ear: Secondary | ICD-10-CM

## 2017-12-22 DIAGNOSIS — G894 Chronic pain syndrome: Secondary | ICD-10-CM | POA: Diagnosis not present

## 2018-01-11 ENCOUNTER — Other Ambulatory Visit: Payer: Self-pay | Admitting: Internal Medicine

## 2018-01-11 DIAGNOSIS — J209 Acute bronchitis, unspecified: Secondary | ICD-10-CM

## 2018-01-12 ENCOUNTER — Other Ambulatory Visit: Payer: Self-pay | Admitting: Cardiology

## 2018-01-12 DIAGNOSIS — H6063 Unspecified chronic otitis externa, bilateral: Secondary | ICD-10-CM | POA: Diagnosis not present

## 2018-01-12 DIAGNOSIS — H6122 Impacted cerumen, left ear: Secondary | ICD-10-CM | POA: Diagnosis not present

## 2018-01-12 DIAGNOSIS — H6523 Chronic serous otitis media, bilateral: Secondary | ICD-10-CM | POA: Diagnosis not present

## 2018-01-12 DIAGNOSIS — H6693 Otitis media, unspecified, bilateral: Secondary | ICD-10-CM | POA: Diagnosis not present

## 2018-01-15 ENCOUNTER — Other Ambulatory Visit: Payer: Self-pay | Admitting: Internal Medicine

## 2018-01-15 DIAGNOSIS — H6502 Acute serous otitis media, left ear: Secondary | ICD-10-CM

## 2018-01-19 DIAGNOSIS — H6693 Otitis media, unspecified, bilateral: Secondary | ICD-10-CM | POA: Diagnosis not present

## 2018-01-19 DIAGNOSIS — H6523 Chronic serous otitis media, bilateral: Secondary | ICD-10-CM | POA: Diagnosis not present

## 2018-01-19 DIAGNOSIS — H6063 Unspecified chronic otitis externa, bilateral: Secondary | ICD-10-CM | POA: Diagnosis not present

## 2018-01-19 DIAGNOSIS — H6122 Impacted cerumen, left ear: Secondary | ICD-10-CM | POA: Diagnosis not present

## 2018-02-01 DIAGNOSIS — H6063 Unspecified chronic otitis externa, bilateral: Secondary | ICD-10-CM | POA: Diagnosis not present

## 2018-02-01 DIAGNOSIS — H6503 Acute serous otitis media, bilateral: Secondary | ICD-10-CM | POA: Diagnosis not present

## 2018-02-15 DIAGNOSIS — H6063 Unspecified chronic otitis externa, bilateral: Secondary | ICD-10-CM | POA: Diagnosis not present

## 2018-02-15 DIAGNOSIS — H6122 Impacted cerumen, left ear: Secondary | ICD-10-CM | POA: Diagnosis not present

## 2018-03-12 ENCOUNTER — Encounter: Payer: Self-pay | Admitting: Cardiology

## 2018-03-12 ENCOUNTER — Ambulatory Visit: Payer: PPO | Admitting: Cardiology

## 2018-03-12 VITALS — BP 122/84 | HR 66 | Ht 70.0 in | Wt 171.0 lb

## 2018-03-12 DIAGNOSIS — I739 Peripheral vascular disease, unspecified: Secondary | ICD-10-CM

## 2018-03-12 DIAGNOSIS — Z720 Tobacco use: Secondary | ICD-10-CM | POA: Diagnosis not present

## 2018-03-12 DIAGNOSIS — I714 Abdominal aortic aneurysm, without rupture, unspecified: Secondary | ICD-10-CM

## 2018-03-12 DIAGNOSIS — I779 Disorder of arteries and arterioles, unspecified: Secondary | ICD-10-CM | POA: Diagnosis not present

## 2018-03-12 DIAGNOSIS — I10 Essential (primary) hypertension: Secondary | ICD-10-CM | POA: Diagnosis not present

## 2018-03-12 DIAGNOSIS — E785 Hyperlipidemia, unspecified: Secondary | ICD-10-CM

## 2018-03-12 DIAGNOSIS — I2510 Atherosclerotic heart disease of native coronary artery without angina pectoris: Secondary | ICD-10-CM

## 2018-03-12 MED ORDER — METOPROLOL TARTRATE 50 MG PO TABS: 50.0000 mg | ORAL_TABLET | Freq: Two times a day (BID) | ORAL | 3 refills | Status: DC

## 2018-03-12 MED ORDER — LISINOPRIL 10 MG PO TABS: ORAL_TABLET | ORAL | 3 refills | Status: DC

## 2018-03-12 NOTE — Progress Notes (Signed)
Cardiology Office Note    Date:  03/12/2018   ID:  [name] [name], DOB 1941-01-06, MRN [id number]  PCP:  Hoyt Koch, MD  Cardiologist:  Dr. Ron Parker Dr. Meda Coffee  CC: pre op clearance- back surgery   History of Present Illness:  [name] [name] is a 77 y.o. male with a history of CAD s/p CABGx5V (2004), tobacco abuse, thrombocytopenia/MGUS, HTN, HLD, RBBB/LPFB, recently diagnosed small AAA followed by Dr. Donnetta Hutching and carotid artery disease who presents to clinic for pre op clearance prior to back surgery. He underwent bypass surgery in 2004. Nuclear study in 2011 revealed no ischemia. His ejection fraction was 55%. He was last seen by Dr Ron Parker in 01/2015. He was still smoking a couple cigarettes a day. No chest pain/SOB.  I saw him in clinic for yearly follow up in 01/2016. He was doing well with no complaints except for back pain that he thought may require surgery soon.   On recent lumbar spine films was found to have a 4 cm aneurysm. This was incompletely visualized and therefore he was sent to Dr. Donnetta Hutching for further discussion. Abdominal US on 07/22/16 showed 4.0 x 3.7 cm and 1.7 x 1.7 right common iliac artery dilatation.  It was felt to be small and asymptomatic. He recommended lifelong [place] follow up. He was cleared for back surgery from a AAA standpoint.   01/12/2017 - the patient is coming after 6 months, he states that he has been completely asymptomatic with chest pain shortness of breath palpitation dizziness, also denies any lower extremity edema syncope orthopnea or proximal nocturnal dyspnea. He's been compliant with his meds and has no side effects. He continues to smoke about 56 cigarettes a day. His only complaint is back pain that limits his activities. He underwent lumbar spine surgery in September with worsening of symptoms and is undergoing another MRI with possible redo surgery by Dr. Rolena Infante in [facility] again.  03/12/2018 - this is one year follow-up,  the patient has been doing well from cardiac standpoint, he denies any chest pain shortness of breath. He underwent back surgery after which he got worse and his walking is limited because of back pain, however he visits water aerobic frequently and enjoys this type of exercise greatly as he doesn't experiencing any pain while in water. He has been compliant with his medications and has no side effects. He denies any falls. No lower extremity edema or claudications.   Past Medical History:  Diagnosis Date  . AAA (abdominal aortic aneurysm) ([place])   . Back pain    10/2010  . Back pain    November, 2013  . Back pain    decompression belt bought off of TV  . CAD (coronary artery disease)   . Carotid artery disease ([place])    Doppler, November, 2013, 0-39% bilateral  . Carotid bruit    November, 2013  . Cataract   . DDD (degenerative disc disease), lumbar   . Dyslipidemia   . Ejection fraction    EF 55%, nuclear 2009  . GERD (gastroesophageal reflux disease)   . HTN (hypertension)   . Hx of CABG    2004  . 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 Hypertension   . Kidney stones   . MGUS (monoclonal gammopathy of unknown significance)   . Myocardial infarction ([place])   . RBBB (right bundle branch block with left anterior fascicular block)   .  Spinal stenosis, lumbar    3-4  . Temporary low platelet count ([place])   . Thrombocytopenia ([place])    Dr. Ralene Ok    Past Surgical History:  Procedure Laterality Date  . BACK SURGERY    . COLONOSCOPY    . COLONOSCOPY WITH PROPOFOL N/A 07/13/2017   Procedure: COLONOSCOPY WITH PROPOFOL;  Surgeon: Manus Gunning, MD;  Location: [facility];  Service: Gastroenterology;  Laterality: N/A;  . CORONARY ARTERY BYPASS GRAFT  2004  . DECOMPRESSIVE LUMBAR LAMINECTOMY LEVEL 1 N/A 09/04/2016   Procedure: DECOMPRESSION L3 - L4;  Surgeon: Melina Schools, MD;  Location: [facility];  Service: Orthopedics;  Laterality: N/A;  . EYE  SURGERY  2013   bilateral cataract extraction w/ IOL (Dr. Celene Squibb), retina surgery  . JOINT REPLACEMENT    . LUMBAR LAMINECTOMY/DECOMPRESSION MICRODISCECTOMY  09/04/2016   L3-4  . TOTAL HIP ARTHROPLASTY Right 10/2003    Current Medications: Outpatient Medications Prior to Visit  Medication Sig Dispense Refill  . albuterol (PROVENTIL HFA;VENTOLIN HFA) 108 (90 Base) MCG/ACT inhaler Inhale 1-2 puffs into the lungs every 6 (six) hours as needed for wheezing or shortness of breath. 8.5 Inhaler 0  . aspirin 81 MG tablet Take 1 tablet (81 mg total) by mouth daily. 90 tablet 3  . benzonatate (TESSALON) 100 MG capsule Take 1 capsule (100 mg total) by mouth 3 (three) times daily as needed for cough. 30 capsule 0  . dextromethorphan-guaiFENesin (MUCINEX DM) 30-600 MG 12hr tablet Take 1 tablet by mouth 2 (two) times daily as needed for cough. 14 tablet 0  . diazepam (VALIUM) 10 MG tablet Take 1 tablet (10 mg total) by mouth every 12 (twelve) hours as needed for anxiety. 30 tablet 0  . esomeprazole (NEXIUM) 20 MG capsule Take 20 mg by mouth daily at 12 noon.    . finasteride (PROSCAR) 5 MG tablet Take 5 mg by mouth daily.    . fluticasone (FLONASE) 50 MCG/ACT nasal spray PLACE 2 SPRAYS INTO EACH NOSTRIL EVERY DAY 16 g 2  . lisinopril (PRINIVIL,ZESTRIL) 10 MG tablet TAKE 1 (10 MG TOTAL) BY MOUTH DAILY 90 tablet 3  . metoprolol tartrate (LOPRESSOR) 50 MG tablet Take 1 tablet by mouth twice a day 180 tablet 0  . Multiple Vitamins-Minerals (CENTRUM SILVER ULTRA MENS PO) Take by mouth daily.      Marland Kitchen neomycin-polymyxin-hydrocortisone (CORTISPORIN) 3.5-10000-1 OTIC suspension Place 3 drops into both ears 3 (three) times daily. 10 mL 0  . nitroGLYCERIN (NITROSTAT) 0.4 MG SL tablet Place 1 tablet (0.4 mg total) under the tongue every 5 (five) minutes as needed for chest pain. 25 tablet 3  . simvastatin (ZOCOR) 40 MG tablet Take 1 tablet by mouth every night at bedtime 90 tablet 0  . tamsulosin (FLOMAX) 0.4 MG CAPS  capsule Take 0.4 mg by mouth 2 (two) times daily.   2  . traMADol (ULTRAM) 50 MG tablet Take 1 tablet by mouth 3 (three) times daily as needed.    Marland Kitchen HYDROcodone-acetaminophen (NORCO) 10-325 MG tablet Take 1 tablet by mouth every 6 (six) hours as needed. 60 tablet 0  . levofloxacin (LEVAQUIN) 500 MG tablet Take 1 tablet (500 mg total) by mouth daily. 7 tablet 0  . ondansetron (ZOFRAN) 4 MG tablet Take 1 tablet (4 mg total) by mouth every 8 (eight) hours as needed for nausea or vomiting. 20 tablet 0  . predniSONE (STERAPRED UNI-PAK 21 TAB) 10 MG (21) TBPK tablet Take by mouth as directed. 21 tablet 0  No facility-administered medications prior to visit.      Allergies:   Penicillins   Social History   Socioeconomic History  . Marital status: Married    Spouse name: Not on file  . Number of children: 2  . Years of education: 59  . Highest education level: Not on file  Occupational History  . Occupation: trucking     Fish farm manager: RETIRED  Social Needs  . Financial resource strain: Not on file  . Food insecurity:    Worry: Not on file    Inability: Not on file  . Transportation needs:    Medical: Not on file    Non-medical: Not on file  Tobacco Use  . Smoking status: Heavy Tobacco Smoker    Packs/day: 0.50    Years: 45.00    Pack years: 22.50    Types: Cigarettes  . Smokeless tobacco: Never Used  . Tobacco comment: half pack day  Substance and Sexual Activity  . Alcohol use: No  . Drug use: No  . Sexual activity: Not Currently  Lifestyle  . Physical activity:    Days per week: Not on file    Minutes per session: Not on file  . Stress: Not on file  Relationships  . Social connections:    Talks on phone: Not on file    Gets together: Not on file    Attends religious service: Not on file    Active member of club or organization: Not on file    Attends meetings of clubs or organizations: Not on file    Relationship status: Not on file  Other Topics Concern  . Not on file   Social History Narrative   HSG, Married '62, 1 son-'70, 1 dtr - '68; 2 grandchildren. Work - Probation officer.   ACP - DNR, DNI, no artificial hydration or feeding, no heroic measures     Family History:  The patient's family history includes Arthritis in his brother; COPD in his mother; Cancer in his paternal grandfather; Heart disease in his brother and brother; Kidney disease in his mother; Stroke in his father.     ROS:   Please see the history of present illness.    ROS All other systems reviewed and are negative.   PHYSICAL EXAM:   VS:  BP 122/84   Pulse 66   Ht 5\' 10"  (1.778 m)   Wt 171 lb (77.6 kg)   BMI 24.54 kg/m    GEN: Well nourished, well developed, in no acute distress  HEENT: normal  Neck: no JVD, carotid bruits, or masses Cardiac: RRR; no murmurs, rubs, or gallops,no edema  Respiratory:  clear to auscultation bilaterally, normal work of breathing GI: soft, nontender, nondistended, + BS MS: no deformity or atrophy  Skin: warm and dry, no rash Neuro:  Alert and Oriented x 3, Strength and sensation are intact Psych: euthymic mood, full affect  Wt Readings from Last 3 Encounters:  03/12/18 171 lb (77.6 kg)  09/08/17 166 lb (75.3 kg)  08/31/17 169 lb (76.7 kg)      Studies/Labs Reviewed:   EKG:  EKG is ordered today. 03/12/2018 The ekg ordered today demonstrates 1st de AV block, RBBB, LAFB HR 66 bpm, this is unchanged from prior and personally reviewed.  Recent Labs: 06/04/2017: ALT 16; BUN 12.0; Creatinine 0.8; HGB 13.8; Platelets 180; Potassium 5.1; Sodium 139   Lipid Panel    Component Value Date/Time   CHOL 142 01/31/2016 0847   TRIG 77.0 01/31/2016 0847  HDL 48.50 01/31/2016 0847   CHOLHDL 3 01/31/2016 0847   VLDL 15.4 01/31/2016 0847   LDLCALC 78 01/31/2016 0847   Additional studies/ records that were reviewed today include:  2D ECHO, abdominal US and myoview- see HPI   ASSESSMENT & PLAN:   CAD: continue ASA, statin and BB. No  ischemic symptoms, no ischemic workup needed at this time.EKG is unchanged from prior.  HTN: BP well controlled. Continue lisinopril 10mg  and lopressor 50mg  BID  HLD:  Lipids at goal, we are followed by [facility], including other labs, last time done in June 2018 we will follow.  Carotid artery disease: He had minimal disease in 2015, no reason to repeat unless new symptoms.  Tobacco abuse: counseled on cessation. Continues to smoke half a pack a day.  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 Followed cloesly by heme/onc  AAA: Abdominal US on 07/22/16 showed 4.0 x 3.7 cm and 1.7 x 1.7 right common iliac artery dilatation. Stable and followed by Dr. Donnetta Hutching.  Follow-up in one year.  Medication Adjustments/Labs and Tests Ordered: Current medicines are reviewed at length with the patient today.  Concerns regarding medicines are outlined above.  Medication changes, Labs and Tests ordered today are listed in the Patient Instructions below. There are no Patient Instructions on file for this visit.   Signed, [name], MD  03/12/2018 11:40 AM    [address], [place], [place]  [address] Phone: [phone]; Fax: [phone]

## 2018-03-12 NOTE — Patient Instructions (Signed)

## 2018-03-22 DIAGNOSIS — M961 Postlaminectomy syndrome, not elsewhere classified: Secondary | ICD-10-CM | POA: Diagnosis not present

## 2018-03-29 DIAGNOSIS — M961 Postlaminectomy syndrome, not elsewhere classified: Secondary | ICD-10-CM | POA: Diagnosis not present

## 2018-03-31 ENCOUNTER — Other Ambulatory Visit (HOSPITAL_COMMUNITY): Payer: Self-pay | Admitting: Physical Medicine and Rehabilitation

## 2018-03-31 DIAGNOSIS — M5136 Other intervertebral disc degeneration, lumbar region: Secondary | ICD-10-CM

## 2018-04-06 DIAGNOSIS — M961 Postlaminectomy syndrome, not elsewhere classified: Secondary | ICD-10-CM | POA: Diagnosis not present

## 2018-04-06 DIAGNOSIS — M5136 Other intervertebral disc degeneration, lumbar region: Secondary | ICD-10-CM | POA: Diagnosis not present

## 2018-04-08 ENCOUNTER — Encounter (HOSPITAL_COMMUNITY)
Admission: RE | Admit: 2018-04-08 | Discharge: 2018-04-08 | Disposition: A | Discharge status: Home / Self Care | Payer: PPO | Source: Ambulatory Visit | Attending: Physical Medicine and Rehabilitation | Admitting: Physical Medicine and Rehabilitation

## 2018-04-08 DIAGNOSIS — M5136 Other intervertebral disc degeneration, lumbar region: Secondary | ICD-10-CM

## 2018-04-08 DIAGNOSIS — M4185 Other forms of scoliosis, thoracolumbar region: Secondary | ICD-10-CM | POA: Diagnosis not present

## 2018-04-08 MED ORDER — TECHNETIUM TC 99M MEDRONATE IV KIT
20.0000 | PACK | Freq: Once | INTRAVENOUS | Status: AC | PRN
Start: 2018-04-08 — End: 2018-04-08
  Administered 2018-04-08: 20 via INTRAVENOUS

## 2018-04-19 DIAGNOSIS — M961 Postlaminectomy syndrome, not elsewhere classified: Secondary | ICD-10-CM | POA: Diagnosis not present

## 2018-05-10 DIAGNOSIS — M21372 Foot drop, left foot: Secondary | ICD-10-CM | POA: Diagnosis not present

## 2018-05-12 ENCOUNTER — Other Ambulatory Visit: Payer: Self-pay | Admitting: Cardiology

## 2018-05-14 ENCOUNTER — Ambulatory Visit (INDEPENDENT_AMBULATORY_CARE_PROVIDER_SITE_OTHER): Payer: PPO | Admitting: *Deleted

## 2018-05-14 VITALS — BP 122/78 | HR 62 | Resp 18 | Ht 70.0 in | Wt 173.0 lb

## 2018-05-14 DIAGNOSIS — Z0000 Encounter for general adult medical examination without abnormal findings: Secondary | ICD-10-CM

## 2018-05-14 NOTE — Patient Instructions (Addendum)
Continue doing brain stimulating activities (puzzles, reading, adult coloring books, staying active) to keep memory sharp.   Continue to eat heart healthy diet (full of fruits, vegetables, whole grains, lean protein, water--limit salt, fat, and sugar intake) and increase physical activity as tolerated.   [name] [name] , Thank you for taking time to come for your Medicare Wellness Visit. I appreciate your ongoing commitment to your health goals. Please review the following plan we discussed and let me know if I can assist you in the future.   These are the goals we discussed: Goals    . Patient Stated     Maintain my current health status. Stay as healthy and as independent as possible. Stay active with my gambling group and enjoy life and family       This is a list of the screening recommended for you and due dates:  Health Maintenance  Topic Date Due  . Flu Shot  07/08/2018  . Tetanus Vaccine  11/10/2021  . Pneumonia vaccines  Completed   \It is important to avoid accidents which may result in broken bones.  Here are a few ideas on how to make your home safer so you will be less likely to trip or fall.  1. Use nonskid mats or non slip strips in your shower or tub, on your bathroom floor and around sinks.  If you know that you have spilled water, wipe it up! 2. In the bathroom, it is important to have properly installed grab bars on the walls or on the edge of the tub.  Towel racks are NOT strong enough for you to hold onto or to pull on for support. 3. Stairs and hallways should have enough light.  Add lamps or night lights if you need ore light. 4. It is good to have handrails on both sides of the stairs if possible.  Always fix broken handrails right away. 5. It is important to see the edges of steps.  Paint the edges of outdoor steps white so you can see them better.  Put colored tape on the edge of inside steps. 6. Throw-rugs are dangerous because they can slide.  Removing the rugs is  the best idea, but if they must stay, add adhesive carpet tape to prevent slipping. 7. Do not keep things on stairs or in the halls.  Remove small furniture that blocks the halls as it may cause you to trip.  Keep telephone and electrical cords out of the way where you walk. 8. Always were sturdy, rubber-soled shoes for good support.  Never wear just socks, especially on the stairs.  Socks may cause you to slip or fall.  Do not wear full-length housecoats as you can easily trip on the bottom.  9. Place the things you use the most on the shelves that are the easiest to reach.  If you use a stepstool, make sure it is in good condition.  If you feel unsteady, DO NOT climb, ask for help. If a health professional advises you to use a cane or walker, do not be ashamed.  These items can keep you from falling and breaking your bones.

## 2018-05-14 NOTE — Progress Notes (Addendum)
Subjective:   [name] [name] is a 77 y.o. male who presents for Medicare Annual/Subsequent preventive examination.  Review of Systems:  No ROS.  Medicare Wellness Visit. Additional risk factors are reflected in the social history.  Cardiac Risk Factors include: advanced age (>4men, >50 women);hypertension;male gender Sleep patterns: gets up 1 times nightly to void and sleeps 7 hours nightly.    Home Safety/Smoke Alarms: Feels safe in home. Smoke alarms in place.  Living environment; residence and Firearm Safety: 1-story house/ trailer, no firearms, Lives with wife, no needs for DME, good support system. Seat Belt Safety/Bike Helmet: Wears seat belt.     Objective:    Vitals: BP 122/78   Pulse 62   Resp 18   Ht 5\' 10"  (1.778 m)   Wt 173 lb (78.5 kg)   SpO2 98%   BMI 24.82 kg/m   Body mass index is 24.82 kg/m.  Advanced Directives 05/14/2018 07/13/2017 06/29/2017 05/14/2017 09/04/2016 08/14/2016 07/22/2016  Does Patient Have a Medical Advance Directive? Yes Yes Yes Yes Yes Yes Yes  Type of Paramedic of [place];Living will [place];Living will [place];Living will [place];Living will Living will;Healthcare Power of [place];Living will Living will;Healthcare Power of Attorney  Does patient want to make changes to medical advance directive? - - - - No - Patient declined No - Patient declined No - Patient declined  Copy of [place] in Chart? - No - copy requested No - copy requested - Yes Yes Yes    Tobacco Social History   Tobacco Use  Smoking Status Heavy Tobacco Smoker  . Packs/day: 0.50  . Years: 45.00  . Pack years: 22.50  . Types: Cigarettes  Smokeless Tobacco Never Used  Tobacco Comment   half pack day     Ready to quit: Not Answered Counseling given: Not Answered Comment: half pack day     Past Medical History:  Diagnosis Date    . AAA (abdominal aortic aneurysm) ([place])   . Back pain    10/2010  . Back pain    November, 2013  . Back pain    decompression belt bought off of TV  . CAD (coronary artery disease)   . Carotid artery disease ([place])    Doppler, November, 2013, 0-39% bilateral  . Carotid bruit    November, 2013  . Cataract   . DDD (degenerative disc disease), lumbar   . Dyslipidemia   . Ejection fraction    EF 55%, nuclear 2009  . GERD (gastroesophageal reflux disease)   . HTN (hypertension)   . Hx of CABG    2004  . 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 Hypertension   . Kidney stones   . MGUS (monoclonal gammopathy of unknown significance)   . Myocardial infarction ([place])   . RBBB (right bundle branch block with left anterior fascicular block)   . Spinal stenosis, lumbar    3-4  . Temporary low platelet count ([place])   . Thrombocytopenia ([place])    Dr. Ralene Ok   Past Surgical History:  Procedure Laterality Date  . BACK SURGERY    . COLONOSCOPY    . COLONOSCOPY WITH PROPOFOL N/A 07/13/2017   Procedure: COLONOSCOPY WITH PROPOFOL;  Surgeon: Manus Gunning, MD;  Location: [facility];  Service: Gastroenterology;  Laterality: N/A;  . CORONARY ARTERY BYPASS GRAFT  2004  .  DECOMPRESSIVE LUMBAR LAMINECTOMY LEVEL 1 N/A 09/04/2016   Procedure: DECOMPRESSION L3 - L4;  Surgeon: Melina Schools, MD;  Location: [facility];  Service: Orthopedics;  Laterality: N/A;  . EYE SURGERY  2013   bilateral cataract extraction w/ IOL (Dr. Celene Squibb), retina surgery  . JOINT REPLACEMENT    . LUMBAR LAMINECTOMY/DECOMPRESSION MICRODISCECTOMY  09/04/2016   L3-4  . TOTAL HIP ARTHROPLASTY Right 10/2003   Family History  Problem Relation Age of Onset  . Kidney disease Mother   . COPD Mother   . Stroke Father        cerebral hemorrhage  . Heart disease Brother   . Heart disease Brother   . Arthritis Brother   . Cancer Paternal Grandfather    Social History   Socioeconomic History   . Marital status: Married    Spouse name: Not on file  . Number of children: 2  . Years of education: 22  . Highest education level: Not on file  Occupational History  . Occupation: trucking     Fish farm manager: RETIRED  Social Needs  . Financial resource strain: Not hard at all  . Food insecurity:    Worry: Never true    Inability: Never true  . Transportation needs:    Medical: No    Non-medical: No  Tobacco Use  . Smoking status: Heavy Tobacco Smoker    Packs/day: 0.50    Years: 45.00    Pack years: 22.50    Types: Cigarettes  . Smokeless tobacco: Never Used  . Tobacco comment: half pack day  Substance and Sexual Activity  . Alcohol use: No  . Drug use: No  . Sexual activity: Not Currently  Lifestyle  . Physical activity:    Days per week: 3 days    Minutes per session: 70 min  . Stress: Not at all  Relationships  . Social connections:    Talks on phone: More than three times a week    Gets together: More than three times a week    Attends religious service: More than 4 times per year    Active member of club or organization: Yes    Attends meetings of clubs or organizations: More than 4 times per year    Relationship status: Married  Other Topics Concern  . Not on file  Social History Narrative   HSG, Married '62, 1 son-'70, 1 dtr - '68; 2 grandchildren. Work - Probation officer.   ACP - DNR, DNI, no artificial hydration or feeding, no heroic measures    Outpatient Encounter Medications as of 05/14/2018  Medication Sig  . albuterol (PROVENTIL HFA;VENTOLIN HFA) 108 (90 Base) MCG/ACT inhaler Inhale 1-2 puffs into the lungs every 6 (six) hours as needed for wheezing or shortness of breath.  Marland Kitchen aspirin 81 MG tablet Take 1 tablet (81 mg total) by mouth daily.  . diazepam (VALIUM) 10 MG tablet Take 1 tablet (10 mg total) by mouth every 12 (twelve) hours as needed for anxiety.  Marland Kitchen esomeprazole (NEXIUM) 20 MG capsule Take 20 mg by mouth daily at 12 noon.  . finasteride  (PROSCAR) 5 MG tablet Take 5 mg by mouth daily.  . fluticasone (FLONASE) 50 MCG/ACT nasal spray PLACE 2 SPRAYS INTO EACH NOSTRIL EVERY DAY  . lisinopril (PRINIVIL,ZESTRIL) 10 MG tablet TAKE 1 (10 MG TOTAL) BY MOUTH DAILY  . metoprolol tartrate (LOPRESSOR) 50 MG tablet Take 1 tablet by mouth twice a day  . Multiple Vitamins-Minerals (CENTRUM SILVER ULTRA MENS PO) Take by mouth  daily.    . neomycin-colistin-hydrocortisone-thonzonium (CORTISPORIN TC) 3.02-07-09-0.5 MG/ML OTIC suspension Place 3 drops into both ears 4 (four) times daily.  . nitroGLYCERIN (NITROSTAT) 0.4 MG SL tablet Place 1 tablet (0.4 mg total) under the tongue every 5 (five) minutes as needed for chest pain.  . simvastatin (ZOCOR) 40 MG tablet Take 1 tablet by mouth every night at bedtime  . tamsulosin (FLOMAX) 0.4 MG CAPS capsule Take 0.4 mg by mouth 2 (two) times daily.   . traMADol (ULTRAM) 50 MG tablet Take 1 tablet by mouth 3 (three) times daily as needed.  . [DISCONTINUED] neomycin-bacitracin-polymyxin (NEOSPORIN) ophthalmic ointment 1 application 4 (four) times daily.  . [DISCONTINUED] benzonatate (TESSALON) 100 MG capsule Take 1 capsule (100 mg total) by mouth 3 (three) times daily as needed for cough. (Patient not taking: Reported on 05/14/2018)  . [DISCONTINUED] dextromethorphan-guaiFENesin (MUCINEX DM) 30-600 MG 12hr tablet Take 1 tablet by mouth 2 (two) times daily as needed for cough. (Patient not taking: Reported on 05/14/2018)  . [DISCONTINUED] neomycin-polymyxin-hydrocortisone (CORTISPORIN) 3.5-10000-1 OTIC suspension Place 3 drops into both ears 3 (three) times daily. (Patient not taking: Reported on 05/14/2018)   No facility-administered encounter medications on file as of 05/14/2018.     Activities of Daily Living In your present state of health, do you have any difficulty performing the following activities: 05/14/2018 05/14/2017  Hearing? N Y  Vision? N N  Difficulty concentrating or making decisions? N N  Walking or  climbing stairs? Y Y  Dressing or bathing? N N  Doing errands, shopping? N N  Preparing Food and eating ? N N  Using the Toilet? N N  In the past six months, have you accidently leaked urine? N Y  Comment - followed by urology  Do you have problems with loss of bowel control? N N  Managing your Medications? N N  Managing your Finances? N N  Housekeeping or managing your Housekeeping? N N  Some recent data might be hidden    Patient Care Team: Hoyt Koch, MD as PCP - General (Internal Medicine) Suella Broad, MD (Physical Medicine and Rehabilitation) Murinson, Haynes Bast, MD (Hematology and Oncology)   Assessment:   This is a routine wellness examination for [facility]. Physical assessment deferred to PCP.   Exercise Activities and Dietary recommendations Current Exercise Habits: Structured exercise class, Type of exercise: stretching(water aerobics), Time (Minutes): 45, Frequency (Times/Week): 3, Weekly Exercise (Minutes/Week): 135, Intensity: Mild, Exercise limited by: orthopedic condition(s) Diet (meal preparation, eat out, water intake, caffeinated beverages, dairy products, fruits and vegetables): in general, a "healthy" diet  , well balanced   Reviewed heart healthy diet, Encouraged patient to increase daily water and fluid intake.  Goals    . Patient Stated     Maintain my current health status. Stay as healthy and as independent as possible. Stay active with my gambling group and enjoy life and family       Fall Risk Fall Risk  05/14/2018 08/31/2017 05/14/2017 01/31/2016 05/23/2014  Falls in the past year? No Yes No No No  Number falls in past yr: - 1 - - -  Injury with Fall? - No - - -  Risk for fall due to : Impaired balance/gait;Impaired mobility - Impaired balance/gait;Impaired mobility - Impaired balance/gait  Risk for fall due to: Comment - - - - has a bad back and when pain hits makes him unsteady    Depression Screen PHQ 2/9 Scores 05/14/2018 08/31/2017  05/14/2017 01/31/2016  PHQ - 2  Score 1 0 1 0  PHQ- 9 Score 3 - - -    Cognitive Function MMSE - Mini Mental State Exam 05/14/2018  Orientation to time 5  Orientation to Place 5  Registration 3  Attention/ Calculation 5  Recall 2  Language- name 2 objects 2  Language- repeat 1  Language- follow 3 step command 3  Language- read & follow direction 1  Write a sentence 1  Copy design 1  Total score 29        Immunization History  Administered Date(s) Administered  . Influenza Split 11/11/2011  . Influenza, High Dose Seasonal PF 11/05/2016, 11/19/2017  . Influenza, Seasonal, Injecte, Preservative Fre 12/06/2012  . Influenza,inj,Quad PF,6+ Mos 01/19/2015, 01/31/2016  . Pneumococcal Conjugate-13 07/31/2015  . Pneumococcal Polysaccharide-23 12/06/2012  . Tdap 11/11/2011   Screening Tests Health Maintenance  Topic Date Due  . INFLUENZA VACCINE  07/08/2018  . TETANUS/TDAP  11/10/2021  . PNA vac Low Risk Adult  Completed      Plan:    Continue doing brain stimulating activities (puzzles, reading, adult coloring books, staying active) to keep memory sharp.   Continue to eat heart healthy diet (full of fruits, vegetables, whole grains, lean protein, water--limit salt, fat, and sugar intake) and increase physical activity as tolerated.  I have personally reviewed and noted the following in the patient's chart:   . Medical and social history . Use of alcohol, tobacco or illicit drugs  . Current medications and supplements . Functional ability and status . Nutritional status . Physical activity . Advanced directives . List of other physicians . Vitals . Screenings to include cognitive, depression, and falls . Referrals and appointments  In addition, I have reviewed and discussed with patient certain preventive protocols, quality metrics, and best practice recommendations. A written personalized care plan for preventive services as well as general preventive health  recommendations were provided to patient.     Michiel Cowboy, RN  05/14/2018   Medical screening examination/treatment/procedure(s) were performed by non-physician practitioner and as supervising physician I was immediately available for consultation/collaboration. I agree with above. Binnie Rail, MD

## 2018-06-03 ENCOUNTER — Inpatient Hospital Stay: Payer: PPO | Attending: Internal Medicine | Admitting: Internal Medicine

## 2018-06-03 ENCOUNTER — Telehealth: Payer: Self-pay | Admitting: Internal Medicine

## 2018-06-03 ENCOUNTER — Encounter: Payer: Self-pay | Admitting: Internal Medicine

## 2018-06-03 ENCOUNTER — Inpatient Hospital Stay: Payer: PPO

## 2018-06-03 VITALS — BP 139/86 | HR 72 | Temp 98.0°F | Resp 18 | Ht 70.0 in | Wt 173.5 lb

## 2018-06-03 DIAGNOSIS — D696 Thrombocytopenia, unspecified: Secondary | ICD-10-CM

## 2018-06-03 DIAGNOSIS — M48061 Spinal stenosis, lumbar region without neurogenic claudication: Secondary | ICD-10-CM | POA: Insufficient documentation

## 2018-06-03 DIAGNOSIS — M5136 Other intervertebral disc degeneration, lumbar region: Secondary | ICD-10-CM

## 2018-06-03 DIAGNOSIS — Z7982 Long term (current) use of aspirin: Secondary | ICD-10-CM | POA: Insufficient documentation

## 2018-06-03 DIAGNOSIS — G8929 Other chronic pain: Secondary | ICD-10-CM | POA: Diagnosis not present

## 2018-06-03 DIAGNOSIS — I2510 Atherosclerotic heart disease of native coronary artery without angina pectoris: Secondary | ICD-10-CM | POA: Diagnosis not present

## 2018-06-03 DIAGNOSIS — I4510 Unspecified right bundle-branch block: Secondary | ICD-10-CM | POA: Diagnosis not present

## 2018-06-03 DIAGNOSIS — M549 Dorsalgia, unspecified: Secondary | ICD-10-CM

## 2018-06-03 DIAGNOSIS — I10 Essential (primary) hypertension: Secondary | ICD-10-CM | POA: Insufficient documentation

## 2018-06-03 DIAGNOSIS — Z87442 Personal history of urinary calculi: Secondary | ICD-10-CM | POA: Diagnosis not present

## 2018-06-03 DIAGNOSIS — I252 Old myocardial infarction: Secondary | ICD-10-CM | POA: Diagnosis not present

## 2018-06-03 DIAGNOSIS — E785 Hyperlipidemia, unspecified: Secondary | ICD-10-CM | POA: Diagnosis not present

## 2018-06-03 DIAGNOSIS — Z79899 Other long term (current) drug therapy: Secondary | ICD-10-CM | POA: Diagnosis not present

## 2018-06-03 DIAGNOSIS — K219 Gastro-esophageal reflux disease without esophagitis: Secondary | ICD-10-CM | POA: Diagnosis not present

## 2018-06-03 DIAGNOSIS — D472 Monoclonal gammopathy: Secondary | ICD-10-CM

## 2018-06-03 DIAGNOSIS — I714 Abdominal aortic aneurysm, without rupture: Secondary | ICD-10-CM

## 2018-06-03 LAB — COMPREHENSIVE METABOLIC PANEL
ALBUMIN: 4.2 g/dL (ref 3.5–5.0)
ALT: 19 U/L (ref 0–44)
ANION GAP: 7 (ref 5–15)
AST: 19 U/L (ref 15–41)
Alkaline Phosphatase: 85 U/L (ref 38–126)
BILIRUBIN TOTAL: 0.5 mg/dL (ref 0.3–1.2)
BUN: 12 mg/dL (ref 8–23)
CO2: 28 mmol/L (ref 22–32)
Calcium: 9.8 mg/dL (ref 8.9–10.3)
Chloride: 106 mmol/L (ref 98–111)
Creatinine, Ser: 0.81 mg/dL (ref 0.61–1.24)
GFR calc Af Amer: >60 mL/min (ref >60)
GFR calc non Af Amer: >60 mL/min (ref >60)
GLUCOSE: 104 mg/dL — AB (ref 70–99)
POTASSIUM: 4.6 mmol/L (ref 3.5–5.1)
SODIUM: 141 mmol/L (ref 135–145)
TOTAL PROTEIN: 7.2 g/dL (ref 6.5–8.1)

## 2018-06-03 LAB — CBC WITH DIFFERENTIAL/PLATELET
BASOS ABS: 0 10*3/uL (ref 0.0–0.1)
Basophils Relative: 0 %
Eosinophils Absolute: 0.7 10*3/uL — ABNORMAL HIGH (ref 0.0–0.5)
Eosinophils Relative: 7 %
HEMATOCRIT: 43.5 % (ref 38.4–49.9)
HEMOGLOBIN: 14.5 g/dL (ref 13.0–17.1)
LYMPHS PCT: 23 %
Lymphs Abs: 2.1 10*3/uL (ref 0.9–3.3)
MCH: 31.1 pg (ref 27.2–33.4)
MCHC: 33.3 g/dL (ref 32.0–36.0)
MCV: 93.3 fL (ref 79.3–98.0)
MONO ABS: 0.5 10*3/uL (ref 0.1–0.9)
Monocytes Relative: 6 %
NEUTROS PCT: 64 %
Neutro Abs: 5.8 10*3/uL (ref 1.5–6.5)
PLATELETS: 184 10*3/uL (ref 140–400)
RBC: 4.66 MIL/uL (ref 4.20–5.82)
RDW: 13.7 % (ref 11.0–14.6)
WBC: 9.1 10*3/uL (ref 4.0–10.3)

## 2018-06-03 LAB — LACTATE DEHYDROGENASE: LDH: 148 U/L (ref 98–192)

## 2018-06-03 NOTE — Progress Notes (Signed)
[place] [phone]   [phone]  OFFICE PROGRESS NOTE  Hoyt Koch, MD [place] [address]  DIAGNOSIS:  1) monoclonal history of undetermined significance. 2) thrombocytopenia  PRIOR THERAPY: None  CURRENT THERAPY: Observation  INTERVAL HISTORY: [name] [name] 77 y.o. male returns to the clinic today for annual follow-up visit.  The patient is feeling fine except for the chronic back pain.  He is currently followed by Dr. Dossie Der and Dr. Rolena Infante.  He is expected to have surgery in the next few weeks after clearance from psychiatry.  He denied having any chest pain, shortness of breath, cough or hemoptysis.  He denied having any weight loss or night sweats.  He has no nausea, vomiting, diarrhea or constipation.  He is here today for evaluation with repeat myeloma panel.  MEDICAL HISTORY: Past Medical History:  Diagnosis Date  . AAA (abdominal aortic aneurysm) ([place])   . Back pain    10/2010  . Back pain    November, 2013  . Back pain    decompression belt bought off of TV  . CAD (coronary artery disease)   . Carotid artery disease ([place])    Doppler, November, 2013, 0-39% bilateral  . Carotid bruit    November, 2013  . Cataract   . DDD (degenerative disc disease), lumbar   . Dyslipidemia   . Ejection fraction    EF 55%, nuclear 2009  . GERD (gastroesophageal reflux disease)   . HTN (hypertension)   . Hx of CABG    2004  . 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 Hypertension   . Kidney stones   . MGUS (monoclonal gammopathy of unknown significance)   . Myocardial infarction ([place])   . RBBB (right bundle branch block with left anterior fascicular block)   . Spinal stenosis, lumbar    3-4  . Temporary low platelet count ([place])   . Thrombocytopenia ([place])    Dr. Ralene Ok    ALLERGIES:  is allergic to penicillins.  MEDICATIONS:  Current Outpatient Medications    Medication Sig Dispense Refill  . albuterol (PROVENTIL HFA;VENTOLIN HFA) 108 (90 Base) MCG/ACT inhaler Inhale 1-2 puffs into the lungs every 6 (six) hours as needed for wheezing or shortness of breath. 8.5 Inhaler 0  . aspirin 81 MG tablet Take 1 tablet (81 mg total) by mouth daily. 90 tablet 3  . diazepam (VALIUM) 10 MG tablet Take 1 tablet (10 mg total) by mouth every 12 (twelve) hours as needed for anxiety. 30 tablet 0  . esomeprazole (NEXIUM) 20 MG capsule Take 20 mg by mouth daily at 12 noon.    . finasteride (PROSCAR) 5 MG tablet Take 5 mg by mouth daily.    . fluticasone (FLONASE) 50 MCG/ACT nasal spray PLACE 2 SPRAYS INTO EACH NOSTRIL EVERY DAY 16 g 2  . lisinopril (PRINIVIL,ZESTRIL) 10 MG tablet TAKE 1 (10 MG TOTAL) BY MOUTH DAILY 90 tablet 3  . metoprolol tartrate (LOPRESSOR) 50 MG tablet Take 1 tablet by mouth twice a day 180 tablet 3  . Multiple Vitamins-Minerals (CENTRUM SILVER ULTRA MENS PO) Take by mouth daily.      Marland Kitchen neomycin-colistin-hydrocortisone-thonzonium (CORTISPORIN TC) 3.02-07-09-0.5 MG/ML OTIC suspension Place 3 drops into both ears 4 (four) times daily.    . nitroGLYCERIN (NITROSTAT) 0.4 MG SL tablet Place 1 tablet (0.4 mg total) under the tongue every 5 (five) minutes as  needed for chest pain. 25 tablet 3  . simvastatin (ZOCOR) 40 MG tablet Take 1 tablet by mouth every night at bedtime 90 tablet 3  . tamsulosin (FLOMAX) 0.4 MG CAPS capsule Take 0.4 mg by mouth 2 (two) times daily.   2  . traMADol (ULTRAM) 50 MG tablet Take 1 tablet by mouth 3 (three) times daily as needed.     No current facility-administered medications for this visit.     SURGICAL HISTORY:  Past Surgical History:  Procedure Laterality Date  . BACK SURGERY    . COLONOSCOPY    . COLONOSCOPY WITH PROPOFOL N/A 07/13/2017   Procedure: COLONOSCOPY WITH PROPOFOL;  Surgeon: Manus Gunning, MD;  [facility];  Service: Gastroenterology;  Laterality: N/A;  . CORONARY ARTERY BYPASS GRAFT   2004  . DECOMPRESSIVE LUMBAR LAMINECTOMY LEVEL 1 N/A 09/04/2016   Procedure: DECOMPRESSION L3 - L4;  Surgeon: Melina Schools, MD;  Location: [facility];  Service: Orthopedics;  Laterality: N/A;  . EYE SURGERY  2013   bilateral cataract extraction w/ IOL (Dr. Celene Squibb), retina surgery  . JOINT REPLACEMENT    . LUMBAR LAMINECTOMY/DECOMPRESSION MICRODISCECTOMY  09/04/2016   L3-4  . TOTAL HIP ARTHROPLASTY Right 10/2003    REVIEW OF SYSTEMS:  A comprehensive review of systems was negative except for: Musculoskeletal: positive for back pain   PHYSICAL EXAMINATION: General appearance: alert, cooperative and no distress Head: Normocephalic, without obvious abnormality, atraumatic Neck: no adenopathy, no JVD, supple, symmetrical, trachea midline and thyroid not enlarged, symmetric, no tenderness/mass/nodules Lymph nodes: Cervical, supraclavicular, and axillary nodes normal. Resp: clear to auscultation bilaterally Back: symmetric, no curvature. ROM normal. No CVA tenderness. Cardio: regular rate and rhythm, S1, S2 normal, no murmur, click, rub or gallop GI: soft, non-tender; bowel sounds normal; no masses,  no organomegaly Extremities: extremities normal, atraumatic, no cyanosis or edema  ECOG PERFORMANCE STATUS: 1 - Symptomatic but completely ambulatory  Blood pressure 139/86, pulse 72, temperature 98 F (36.7 C), temperature source Oral, resp. rate 18, height 5\' 10"  (1.778 m), weight 173 lb 8 oz (78.7 kg), SpO2 98 %.  LABORATORY DATA: Lab Results  Component Value Date   WBC 9.1 06/03/2018   HGB 14.5 06/03/2018   HCT 43.5 06/03/2018   MCV 93.3 06/03/2018   PLT 184 06/03/2018      Chemistry      Component Value Date/Time   NA 139 06/04/2017 1031   K 5.1 06/04/2017 1031   CL 107 09/04/2016 1122   CL 108 (H) 05/23/2013 1514   CO2 26 06/04/2017 1031   BUN 12.0 06/04/2017 1031   CREATININE 0.8 06/04/2017 1031      Component Value Date/Time   CALCIUM 9.5 06/04/2017 1031   ALKPHOS 77  06/04/2017 1031   AST 18 06/04/2017 1031   ALT 16 06/04/2017 1031   BILITOT 0.44 06/04/2017 1031       RADIOGRAPHIC STUDIES: No results found.  ASSESSMENT AND PLAN:  This is a very pleasant 77 years old white male with history of monoclonal gammopathy as well as thrombocytopenia he is currently on observation.  He had repeat myeloma panel performed earlier today.  CBC is unremarkable but the remaining lab work is still pending. I recommended for the patient to continue on observation for now unless the myeloma panel showed any concerning abnormalities we will call him with the results and recommendation. I will see him back for follow-up visit in 1 year for evaluation with repeat myeloma panel. For the back pain,  he had vertebroplasty to T4-5 but he developed compression fracture of T6.  He is followed by orthopedic surgery. The patient was advised to call immediately if he has any concerning symptoms in the interval. The patient voices understanding of current disease status and treatment options and is in agreement with the current care plan. All questions were answered. The patient knows to call the clinic with any problems, questions or concerns. We can certainly see the patient much sooner if necessary. I spent 10 minutes counseling the patient face to face. The total time spent in the appointment was 15 minutes.  Disclaimer: This note was dictated with voice recognition software. Similar sounding words can inadvertently be transcribed and may not be corrected upon review.

## 2018-06-03 NOTE — Telephone Encounter (Signed)
Scheduled appt per 6/27 los - gave patient aVS and calender per los.  

## 2018-06-04 LAB — IGG, IGA, IGM
IGA: 38 mg/dL — AB (ref 61–437)
IGM (IMMUNOGLOBULIN M), SRM: 25 mg/dL (ref 15–143)
IgG (Immunoglobin G), Serum: 790 mg/dL (ref 700–1600)

## 2018-06-04 LAB — KAPPA/LAMBDA LIGHT CHAINS
Kappa free light chain: 14.6 mg/L (ref 3.3–19.4)
Kappa, lambda light chain ratio: 0.03 — ABNORMAL LOW (ref 0.26–1.65)
Lambda free light chains: 470.3 mg/L — ABNORMAL HIGH (ref 5.7–26.3)

## 2018-06-04 LAB — BETA 2 MICROGLOBULIN, SERUM: BETA 2 MICROGLOBULIN: 1.6 mg/L (ref 0.6–2.4)

## 2018-06-09 DIAGNOSIS — F419 Anxiety disorder, unspecified: Secondary | ICD-10-CM | POA: Diagnosis not present

## 2018-06-09 DIAGNOSIS — F4542 Pain disorder with related psychological factors: Secondary | ICD-10-CM | POA: Diagnosis not present

## 2018-06-09 DIAGNOSIS — F630 Pathological gambling: Secondary | ICD-10-CM | POA: Diagnosis not present

## 2018-06-09 DIAGNOSIS — M961 Postlaminectomy syndrome, not elsewhere classified: Secondary | ICD-10-CM | POA: Diagnosis not present

## 2018-07-15 ENCOUNTER — Encounter: Payer: Self-pay | Admitting: Gastroenterology

## 2018-07-30 DIAGNOSIS — Z5309 Procedure and treatment not carried out because of other contraindication: Secondary | ICD-10-CM | POA: Diagnosis not present

## 2018-07-30 DIAGNOSIS — Z538 Procedure and treatment not carried out for other reasons: Secondary | ICD-10-CM | POA: Diagnosis not present

## 2018-07-30 DIAGNOSIS — M5416 Radiculopathy, lumbar region: Secondary | ICD-10-CM | POA: Diagnosis not present

## 2018-07-30 DIAGNOSIS — G894 Chronic pain syndrome: Secondary | ICD-10-CM | POA: Diagnosis not present

## 2018-07-30 DIAGNOSIS — M961 Postlaminectomy syndrome, not elsewhere classified: Secondary | ICD-10-CM | POA: Diagnosis not present

## 2018-08-17 ENCOUNTER — Ambulatory Visit: Payer: PPO | Admitting: Urology

## 2018-08-17 DIAGNOSIS — R351 Nocturia: Secondary | ICD-10-CM | POA: Diagnosis not present

## 2018-08-17 DIAGNOSIS — N401 Enlarged prostate with lower urinary tract symptoms: Secondary | ICD-10-CM | POA: Diagnosis not present

## 2018-09-20 DIAGNOSIS — G894 Chronic pain syndrome: Secondary | ICD-10-CM | POA: Diagnosis not present

## 2018-09-20 DIAGNOSIS — M129 Arthropathy, unspecified: Secondary | ICD-10-CM | POA: Diagnosis not present

## 2018-09-20 DIAGNOSIS — M4800 Spinal stenosis, site unspecified: Secondary | ICD-10-CM | POA: Diagnosis not present

## 2018-09-20 DIAGNOSIS — Z79899 Other long term (current) drug therapy: Secondary | ICD-10-CM | POA: Diagnosis not present

## 2018-09-29 ENCOUNTER — Other Ambulatory Visit: Payer: Self-pay | Admitting: Urology

## 2018-09-29 DIAGNOSIS — N138 Other obstructive and reflux uropathy: Secondary | ICD-10-CM

## 2018-09-29 DIAGNOSIS — N401 Enlarged prostate with lower urinary tract symptoms: Principal | ICD-10-CM

## 2018-10-04 DIAGNOSIS — G894 Chronic pain syndrome: Secondary | ICD-10-CM | POA: Diagnosis not present

## 2018-10-04 DIAGNOSIS — M4800 Spinal stenosis, site unspecified: Secondary | ICD-10-CM | POA: Diagnosis not present

## 2018-10-04 DIAGNOSIS — Z79899 Other long term (current) drug therapy: Secondary | ICD-10-CM | POA: Diagnosis not present

## 2018-10-05 ENCOUNTER — Other Ambulatory Visit: Payer: Self-pay | Admitting: Urology

## 2018-10-05 ENCOUNTER — Ambulatory Visit (HOSPITAL_COMMUNITY)
Admission: RE | Admit: 2018-10-05 | Discharge: 2018-10-05 | Disposition: A | Discharge status: Home / Self Care | Payer: PPO | Source: Ambulatory Visit | Attending: Urology | Admitting: Urology

## 2018-10-05 DIAGNOSIS — N138 Other obstructive and reflux uropathy: Secondary | ICD-10-CM

## 2018-10-05 DIAGNOSIS — N401 Enlarged prostate with lower urinary tract symptoms: Secondary | ICD-10-CM | POA: Insufficient documentation

## 2018-10-07 DIAGNOSIS — Z961 Presence of intraocular lens: Secondary | ICD-10-CM | POA: Diagnosis not present

## 2018-10-29 DIAGNOSIS — G894 Chronic pain syndrome: Secondary | ICD-10-CM | POA: Diagnosis not present

## 2018-10-29 DIAGNOSIS — M961 Postlaminectomy syndrome, not elsewhere classified: Secondary | ICD-10-CM | POA: Diagnosis not present

## 2018-10-29 DIAGNOSIS — M5136 Other intervertebral disc degeneration, lumbar region: Secondary | ICD-10-CM | POA: Diagnosis not present

## 2018-10-29 DIAGNOSIS — Z79899 Other long term (current) drug therapy: Secondary | ICD-10-CM | POA: Diagnosis not present

## 2018-11-02 ENCOUNTER — Ambulatory Visit (INDEPENDENT_AMBULATORY_CARE_PROVIDER_SITE_OTHER): Payer: PPO | Admitting: Urology

## 2018-11-02 DIAGNOSIS — N401 Enlarged prostate with lower urinary tract symptoms: Secondary | ICD-10-CM | POA: Diagnosis not present

## 2018-11-02 DIAGNOSIS — R351 Nocturia: Secondary | ICD-10-CM

## 2018-11-11 ENCOUNTER — Other Ambulatory Visit: Payer: Self-pay | Admitting: Urology

## 2018-11-29 NOTE — Patient Instructions (Signed)
[name] [name]  11/29/2018     @PREFPERIOPPHARMACY @   Your procedure is scheduled on  12/14/2018 .  Report to [facility] at  1100   A.M.  Call this number if you have problems the morning of surgery:  [phone]   Remember:  Do not eat or drink after midnight.                          Take these medicines the morning of surgery with A SIP OF WATER  Valium ( if needed), nexium, lisinopril, metoprolol, flomax, tramadol ( if needed). Use your inhaler before you come.    Do not wear jewelry, make-up or nail polish.  Do not wear lotions, powders, or perfumes, or deodorant.  Do not shave 48 hours prior to surgery.  Men may shave face and neck.  Do not bring valuables to the hospital.  [facility] is not responsible for any belongings or valuables.  Contacts, dentures or bridgework may not be worn into surgery.  Leave your suitcase in the car.  After surgery it may be brought to your room.  For patients admitted to the hospital, discharge time will be determined by your treatment team.  Patients discharged the day of surgery will not be allowed to drive home.   Name and phone number of your driver:   family Special instructions:  None  Please read over the following fact sheets that you were given. Anesthesia Post-op Instructions and Care and Recovery After Surgery       Cystoscopy  Cystoscopy is a procedure that is used to help diagnose and sometimes treat conditions that affect that lower urinary tract. The lower urinary tract includes the bladder and the tube that drains urine from the bladder out of the body (urethra). Cystoscopy is performed with a thin, tube-shaped instrument with a light and camera at the end (cystoscope). The cystoscope may be hard (rigid) or flexible, depending on the goal of the procedure.The cystoscope is inserted through the urethra, into the bladder. Cystoscopy may be recommended if you have:  Urinary tractinfections that keep  coming back (recurring).  Blood in the urine (hematuria).  Loss of bladder control (urinary incontinence) or an overactive bladder.  Unusual cells found in a urine sample.  A blockage in the urethra.  Painful urination.  An abnormality in the bladder found during an intravenous pyelogram (IVP) or CT scan. Cystoscopy may also be done to remove a sample of tissue to be examined under a microscope (biopsy). Tell a health care provider about:  Any allergies you have.  All medicines you are taking, including vitamins, herbs, eye drops, creams, and over-the-counter medicines.  Any problems you or family members have had with anesthetic medicines.  Any blood disorders you have.  Any surgeries you have had.  Any medical conditions you have.  Whether you are pregnant or may be pregnant. What are the risks? Generally, this is a safe procedure. However, problems may occur, including:  Infection.  Bleeding.  Allergic reactions to medicines.  Damage to other structures or organs. What happens before the procedure?  Ask your health care provider about: ? Changing or stopping your regular medicines. This is especially important if you are taking diabetes medicines or blood thinners. ? Taking medicines such as aspirin and ibuprofen. These medicines can thin your blood. Do not take these medicines before your procedure if your health care provider instructs  you not to.  Follow instructions from your health care provider about eating or drinking restrictions.  You may be given antibiotic medicine to help prevent infection.  You may have an exam or testing, such as X-rays of the bladder, urethra, or kidneys.  You may have urine tests to check for signs of infection.  Plan to have someone take you home after the procedure. What happens during the procedure?  To reduce your risk of infection,your health care team will wash or sanitize their hands.  You will be given one or more  of the following: ? A medicine to help you relax (sedative). ? A medicine to numb the area (local anesthetic).  The area around the opening of your urethra will be cleaned.  The cystoscope will be passed through your urethra into your bladder.  Germ-free (sterile)fluid will flow through the cystoscope to fill your bladder. The fluid will stretch your bladder so that your surgeon can clearly examine your bladder walls.  The cystoscope will be removed and your bladder will be emptied. The procedure may vary among health care providers and hospitals. What happens after the procedure?  You may have some soreness or pain in your abdomen and urethra. Medicines will be available to help you.  You may have some blood in your urine.  Do not drive for 24 hours if you received a sedative. This information is not intended to replace advice given to you by your health care provider. Make sure you discuss any questions you have with your health care provider. Document Released: 11/21/2000 Document Revised: 09/04/2017 Document Reviewed: 10/11/2015 Elsevier Interactive Patient Education  2019 [place] Anesthesia, Adult General anesthesia is the use of medicines to make a person "go to sleep" (unconscious) for a medical procedure. General anesthesia must be used for certain procedures, and is often recommended for procedures that:  Last a long time.  Require you to be still or in an unusual position.  Are major and can cause blood loss. The medicines used for general anesthesia are called general anesthetics. As well as making you unconscious for a certain amount of time, these medicines:  Prevent pain.  Control your blood pressure.  Relax your muscles. Tell a health care provider about:  Any allergies you have.  All medicines you are taking, including vitamins, herbs, eye drops, creams, and over-the-counter medicines.  Any problems you or family members have had with  anesthetic medicines.  Types of anesthetics you have had in the past.  Any blood disorders you have.  Any surgeries you have had.  Any medical conditions you have.  Any recent upper respiratory, chest, or ear infections.  Any history of: ? Heart or lung conditions, such as heart failure, sleep apnea, asthma, or chronic obstructive pulmonary disease (COPD). ? Armed forces logistics/support/administrative officer. ? Depression or anxiety.  Any tobacco or drug use, including marijuana or alcohol use.  Whether you are pregnant or may be pregnant. What are the risks? Generally, this is a safe procedure. However, problems may occur, including:  Allergic reaction.  Lung and heart problems.  Inhaling food or liquid from the stomach into the lungs (aspiration).  Nerve injury.  Dental injury.  Air in the bloodstream, which can lead to stroke.  Extreme agitation or confusion (delirium) when you wake up from the anesthetic.  Waking up during your procedure and being unable to move. This is rare. These problems are more likely to develop if you are having a major surgery or if  you have an advanced or serious medical condition. You can prevent some of these complications by answering all of your health care provider's questions thoroughly and by following all instructions before your procedure. General anesthesia can cause side effects, including:  Nausea or vomiting.  A sore throat from the breathing tube.  Hoarseness.  Wheezing or coughing.  Shaking chills.  Tiredness.  Body aches.  Anxiety.  Sleepiness or drowsiness.  Confusion or agitation. What happens before the procedure? Staying hydrated Follow instructions from your health care provider about hydration, which may include:  Up to 2 hours before the procedure - you may continue to drink clear liquids, such as water, clear fruit juice, black coffee, and plain tea.  Eating and drinking restrictions Follow instructions from your health care  provider about eating and drinking, which may include:  8 hours before the procedure - stop eating heavy meals or foods such as meat, fried foods, or fatty foods.  6 hours before the procedure - stop eating light meals or foods, such as toast or cereal.  6 hours before the procedure - stop drinking milk or drinks that contain milk.  2 hours before the procedure - stop drinking clear liquids. Medicines Ask your health care provider about:  Changing or stopping your regular medicines. This is especially important if you are taking diabetes medicines or blood thinners.  Taking medicines such as aspirin and ibuprofen. These medicines can thin your blood. Do not take these medicines unless your health care provider tells you to take them.  Taking over-the-counter medicines, vitamins, herbs, and supplements. Do not take these during the week before your procedure unless your health care provider approves them. General instructions  Starting 3-6 weeks before the procedure, do not use any products that contain nicotine or tobacco, such as cigarettes and e-cigarettes. If you need help quitting, ask your health care provider.  If you brush your teeth on the morning of the procedure, make sure to spit out all of the toothpaste.  Tell your health care provider if you become ill or develop a cold, cough, or fever.  If instructed by your health care provider, bring your sleep apnea device with you on the day of your surgery (if applicable).  Ask your health care provider if you will be going home the same day, the following day, or after a longer hospital stay. ? Plan to have someone take you home from the hospital or clinic. ? Plan to have a responsible adult care for you for at least 24 hours after you leave the hospital or clinic. This is important. What happens during the procedure?   You will be given anesthetics through both of the following: ? A mask placed over your nose and mouth. ? An  IV in one of your veins.  You may receive a medicine to help you relax (sedative).  After you are unconscious, a breathing tube may be inserted down your throat to help you breathe. This will be removed before you wake up.  An anesthesia specialist will stay with you throughout your procedure. He or she will: ? Keep you comfortable and safe by continuing to give you medicines and adjusting the amount of medicine that you get. ? Monitor your blood pressure, pulse, and oxygen levels to make sure that the anesthetics do not cause any problems. The procedure may vary among health care providers and hospitals. What happens after the procedure?  Your blood pressure, temperature, heart rate, breathing rate, and blood oxygen  level will be monitored until the medicines you were given have worn off.  You will wake up in a recovery area. You may wake up slowly.  If you feel anxious or agitated, you may be given medicine to help you calm down.  If you will be going home the same day, your health care provider may check to make sure you can walk, drink, and urinate.  Your health care provider will treat any pain or side effects you have before you go home.  Do not drive for 24 hours if you were given a sedative. Summary  General anesthesia is used to keep you still and prevent pain during a procedure.  It is important to tell your health care provider about your medical history and any surgeries you have had, and previous experience with anesthesia.  Follow your health care provider's instructions about when to stop eating, drinking, or taking certain medicines before your procedure.  Plan to have someone take you home from the hospital or clinic. This information is not intended to replace advice given to you by your health care provider. Make sure you discuss any questions you have with your health care provider. Document Released: 03/02/2008 Document Revised: 04/13/2018 Document Reviewed:  07/10/2017 Elsevier Interactive Patient Education  2019 [place] Anesthesia, Adult, Care After This sheet gives you information about how to care for yourself after your procedure. Your health care provider may also give you more specific instructions. If you have problems or questions, contact your health care provider. What can I expect after the procedure? After the procedure, the following side effects are common:  Pain or discomfort at the IV site.  Nausea.  Vomiting.  Sore throat.  Trouble concentrating.  Feeling cold or chills.  Weak or tired.  Sleepiness and fatigue.  Soreness and body aches. These side effects can affect parts of the body that were not involved in surgery. Follow these instructions at home:  For at least 24 hours after the procedure:  Have a responsible adult stay with you. It is important to have someone help care for you until you are awake and alert.  Rest as needed.  Do not: ? Participate in activities in which you could fall or become injured. ? Drive. ? Use heavy machinery. ? Drink alcohol. ? Take sleeping pills or medicines that cause drowsiness. ? Make important decisions or sign legal documents. ? Take care of children on your own. Eating and drinking  Follow any instructions from your health care provider about eating or drinking restrictions.  When you feel hungry, start by eating small amounts of foods that are soft and easy to digest (bland), such as toast. Gradually return to your regular diet.  Drink enough fluid to keep your urine pale yellow.  If you vomit, rehydrate by drinking water, juice, or clear broth. General instructions  If you have sleep apnea, surgery and certain medicines can increase your risk for breathing problems. Follow instructions from your health care provider about wearing your sleep device: ? Anytime you are sleeping, including during daytime naps. ? While taking prescription pain  medicines, sleeping medicines, or medicines that make you drowsy.  Return to your normal activities as told by your health care provider. Ask your health care provider what activities are safe for you.  Take over-the-counter and prescription medicines only as told by your health care provider.  If you smoke, do not smoke without supervision.  Keep all follow-up visits as told by your health  care provider. This is important. Contact a health care provider if:  You have nausea or vomiting that does not get better with medicine.  You cannot eat or drink without vomiting.  You have pain that does not get better with medicine.  You are unable to pass urine.  You develop a skin rash.  You have a fever.  You have redness around your IV site that gets worse. Get help right away if:  You have difficulty breathing.  You have chest pain.  You have blood in your urine or stool, or you vomit blood. Summary  After the procedure, it is common to have a sore throat or nausea. It is also common to feel tired.  Have a responsible adult stay with you for the first 24 hours after general anesthesia. It is important to have someone help care for you until you are awake and alert.  When you feel hungry, start by eating small amounts of foods that are soft and easy to digest (bland), such as toast. Gradually return to your regular diet.  Drink enough fluid to keep your urine pale yellow.  Return to your normal activities as told by your health care provider. Ask your health care provider what activities are safe for you. This information is not intended to replace advice given to you by your health care provider. Make sure you discuss any questions you have with your health care provider. Document Released: 03/02/2001 Document Revised: 07/10/2017 Document Reviewed: 07/10/2017 Elsevier Interactive Patient Education  2019 [employer].

## 2018-12-09 ENCOUNTER — Encounter (HOSPITAL_COMMUNITY): Payer: Self-pay

## 2018-12-09 ENCOUNTER — Other Ambulatory Visit: Payer: Self-pay

## 2018-12-09 ENCOUNTER — Encounter (HOSPITAL_COMMUNITY)
Admission: RE | Admit: 2018-12-09 | Discharge: 2018-12-09 | Disposition: A | Discharge status: Home / Self Care | Payer: PPO | Source: Ambulatory Visit | Attending: Urology | Admitting: Urology

## 2018-12-09 DIAGNOSIS — Z01812 Encounter for preprocedural laboratory examination: Secondary | ICD-10-CM | POA: Diagnosis not present

## 2018-12-09 HISTORY — DX: Personal history of urinary calculi: Z87.442

## 2018-12-09 LAB — CBC WITH DIFFERENTIAL/PLATELET
Abs Immature Granulocytes: 0.02 10*3/uL (ref 0.00–0.07)
Basophils Absolute: 0 10*3/uL (ref 0.0–0.1)
Basophils Relative: 0 %
Eosinophils Absolute: 0.7 10*3/uL — ABNORMAL HIGH (ref 0.0–0.5)
Eosinophils Relative: 8 %
HEMATOCRIT: 43.8 % (ref 39.0–52.0)
HEMOGLOBIN: 13.9 g/dL (ref 13.0–17.0)
Immature Granulocytes: 0 %
LYMPHS ABS: 2 10*3/uL (ref 0.7–4.0)
LYMPHS PCT: 21 %
MCH: 30 pg (ref 26.0–34.0)
MCHC: 31.7 g/dL (ref 30.0–36.0)
MCV: 94.4 fL (ref 80.0–100.0)
MONO ABS: 0.6 10*3/uL (ref 0.1–1.0)
Monocytes Relative: 6 %
Neutro Abs: 6.1 10*3/uL (ref 1.7–7.7)
Neutrophils Relative %: 65 %
Platelets: 207 10*3/uL (ref 150–400)
RBC: 4.64 MIL/uL (ref 4.22–5.81)
RDW: 13.2 % (ref 11.5–15.5)
WBC: 9.5 10*3/uL (ref 4.0–10.5)
nRBC: 0 % (ref 0.0–0.2)

## 2018-12-09 LAB — BASIC METABOLIC PANEL
Anion gap: 7 (ref 5–15)
BUN: 13 mg/dL (ref 8–23)
CHLORIDE: 107 mmol/L (ref 98–111)
CO2: 23 mmol/L (ref 22–32)
CREATININE: 0.68 mg/dL (ref 0.61–1.24)
Calcium: 9 mg/dL (ref 8.9–10.3)
GFR calc Af Amer: >60 mL/min (ref >60)
GFR calc non Af Amer: >60 mL/min (ref >60)
GLUCOSE: 100 mg/dL — AB (ref 70–99)
Potassium: 4 mmol/L (ref 3.5–5.1)
Sodium: 137 mmol/L (ref 135–145)

## 2018-12-14 ENCOUNTER — Encounter (HOSPITAL_COMMUNITY)
Admission: RE | Disposition: A | Discharge status: Home / Self Care | Payer: Self-pay | Source: Home / Self Care | Attending: Urology

## 2018-12-14 ENCOUNTER — Ambulatory Visit (HOSPITAL_COMMUNITY): Payer: PPO | Admitting: Anesthesiology

## 2018-12-14 ENCOUNTER — Encounter (HOSPITAL_COMMUNITY): Payer: Self-pay | Admitting: *Deleted

## 2018-12-14 ENCOUNTER — Ambulatory Visit (HOSPITAL_COMMUNITY)
Admission: RE | Admit: 2018-12-14 | Discharge: 2018-12-14 | Disposition: A | Discharge status: Home / Self Care | Payer: PPO | Attending: Urology | Admitting: Urology

## 2018-12-14 DIAGNOSIS — N400 Enlarged prostate without lower urinary tract symptoms: Secondary | ICD-10-CM | POA: Diagnosis not present

## 2018-12-14 DIAGNOSIS — F17210 Nicotine dependence, cigarettes, uncomplicated: Secondary | ICD-10-CM | POA: Diagnosis not present

## 2018-12-14 DIAGNOSIS — N138 Other obstructive and reflux uropathy: Secondary | ICD-10-CM | POA: Diagnosis not present

## 2018-12-14 DIAGNOSIS — I10 Essential (primary) hypertension: Secondary | ICD-10-CM | POA: Diagnosis not present

## 2018-12-14 DIAGNOSIS — N401 Enlarged prostate with lower urinary tract symptoms: Secondary | ICD-10-CM | POA: Insufficient documentation

## 2018-12-14 DIAGNOSIS — R3914 Feeling of incomplete bladder emptying: Secondary | ICD-10-CM | POA: Diagnosis not present

## 2018-12-14 DIAGNOSIS — Z96641 Presence of right artificial hip joint: Secondary | ICD-10-CM | POA: Diagnosis not present

## 2018-12-14 DIAGNOSIS — Z951 Presence of aortocoronary bypass graft: Secondary | ICD-10-CM | POA: Diagnosis not present

## 2018-12-14 DIAGNOSIS — I2510 Atherosclerotic heart disease of native coronary artery without angina pectoris: Secondary | ICD-10-CM | POA: Insufficient documentation

## 2018-12-14 DIAGNOSIS — I714 Abdominal aortic aneurysm, without rupture: Secondary | ICD-10-CM | POA: Diagnosis not present

## 2018-12-14 DIAGNOSIS — I252 Old myocardial infarction: Secondary | ICD-10-CM | POA: Diagnosis not present

## 2018-12-14 HISTORY — PX: CYSTOSCOPY WITH INSERTION OF UROLIFT: SHX6678

## 2018-12-14 SURGERY — CYSTOSCOPY WITH INSERTION OF UROLIFT
Anesthesia: General | Site: Perineum

## 2018-12-14 MED ORDER — FENTANYL CITRATE (PF) 100 MCG/2ML IJ SOLN
INTRAMUSCULAR | Status: AC
  Filled 2018-12-14: qty 2

## 2018-12-14 MED ORDER — PHENYLEPHRINE HCL 10 MG/ML IJ SOLN
INTRAMUSCULAR | Status: DC | PRN
  Administered 2018-12-14: 40 ug via INTRAVENOUS
  Administered 2018-12-14: 80 ug via INTRAVENOUS
  Administered 2018-12-14 (×2): 40 ug via INTRAVENOUS
  Administered 2018-12-14: 80 ug via INTRAVENOUS

## 2018-12-14 MED ORDER — EPHEDRINE 5 MG/ML INJ
INTRAVENOUS | Status: AC
  Filled 2018-12-14: qty 10

## 2018-12-14 MED ORDER — LACTATED RINGERS IV SOLN: INTRAVENOUS | Status: DC

## 2018-12-14 MED ORDER — SODIUM CHLORIDE 0.9 % IR SOLN
Status: DC | PRN
  Administered 2018-12-14: 3000 mL

## 2018-12-14 MED ORDER — FENTANYL CITRATE (PF) 100 MCG/2ML IJ SOLN
INTRAMUSCULAR | Status: DC | PRN
  Administered 2018-12-14: 25 ug via INTRAVENOUS
  Administered 2018-12-14: 150 ug via INTRAVENOUS

## 2018-12-14 MED ORDER — LACTATED RINGERS IV SOLN
INTRAVENOUS | Status: DC
  Administered 2018-12-14: 13:00:00 via INTRAVENOUS

## 2018-12-14 MED ORDER — HYDROCODONE-ACETAMINOPHEN 7.5-325 MG PO TABS: 1.0000 | ORAL_TABLET | Freq: Once | ORAL | Status: DC | PRN

## 2018-12-14 MED ORDER — HYDROMORPHONE HCL 1 MG/ML IJ SOLN
0.2500 mg | INTRAMUSCULAR | Status: DC | PRN
  Administered 2018-12-14: 0.5 mg via INTRAVENOUS

## 2018-12-14 MED ORDER — CIPROFLOXACIN IN D5W 400 MG/200ML IV SOLN
400.0000 mg | Freq: Once | INTRAVENOUS | Status: AC
  Administered 2018-12-14: 400 mg via INTRAVENOUS
  Filled 2018-12-14: qty 200

## 2018-12-14 MED ORDER — PROMETHAZINE HCL 25 MG/ML IJ SOLN: 6.2500 mg | INTRAMUSCULAR | Status: DC | PRN

## 2018-12-14 MED ORDER — PHENYLEPHRINE 40 MCG/ML (10ML) SYRINGE FOR IV PUSH (FOR BLOOD PRESSURE SUPPORT)
PREFILLED_SYRINGE | INTRAVENOUS | Status: AC
  Filled 2018-12-14: qty 10

## 2018-12-14 MED ORDER — BUPIVACAINE IN DEXTROSE 0.75-8.25 % IT SOLN
INTRATHECAL | Status: AC
  Filled 2018-12-14: qty 2

## 2018-12-14 MED ORDER — MEPERIDINE HCL 50 MG/ML IJ SOLN: 6.2500 mg | INTRAMUSCULAR | Status: DC | PRN

## 2018-12-14 MED ORDER — SULFAMETHOXAZOLE-TRIMETHOPRIM 800-160 MG PO TABS: 1.0000 | ORAL_TABLET | Freq: Two times a day (BID) | ORAL | 0 refills | Status: DC

## 2018-12-14 SURGICAL SUPPLY — 20 items
BAG DRAIN URO TABLE W/ADPT NS (BAG) ×2 IMPLANT
BAG DRN 8 ADPR NS SKTRN CSTL (BAG) ×1
CATH FOLEY 2WAY SLVR  5CC 18FR (CATHETERS) ×1
CATH FOLEY 2WAY SLVR 5CC 18FR (CATHETERS) IMPLANT
CLOTH BEACON ORANGE TIMEOUT ST (SAFETY) ×2 IMPLANT
GLOVE BIO SURGEON STRL SZ8 (GLOVE) ×2 IMPLANT
GLOVE BIOGEL PI IND STRL 7.0 (GLOVE) ×2 IMPLANT
GLOVE BIOGEL PI INDICATOR 7.0 (GLOVE) ×2
GOWN STRL REUS W/TWL LRG LVL3 (GOWN DISPOSABLE) ×2 IMPLANT
GOWN STRL REUS W/TWL XL LVL3 (GOWN DISPOSABLE) ×2 IMPLANT
KIT TURNOVER CYSTO (KITS) ×2 IMPLANT
MANIFOLD NEPTUNE II (INSTRUMENTS) ×2 IMPLANT
PACK CYSTO (CUSTOM PROCEDURE TRAY) ×2 IMPLANT
PAD ARMBOARD 7.5X6 YLW CONV (MISCELLANEOUS) ×2 IMPLANT
SYSTEM UROLIFT (Male Continence) ×6 IMPLANT
TOWEL OR 17X26 4PK STRL BLUE (TOWEL DISPOSABLE) ×2 IMPLANT
TRAY FOLEY W/BAG SLVR 16FR (SET/KITS/TRAYS/PACK)
TRAY FOLEY W/BAG SLVR 16FR ST (SET/KITS/TRAYS/PACK) IMPLANT
WATER STERILE IRR 3000ML UROMA (IV SOLUTION) ×2 IMPLANT
WATER STERILE IRR 500ML POUR (IV SOLUTION) ×2 IMPLANT

## 2018-12-14 NOTE — Anesthesia Procedure Notes (Signed)
Procedure Name: LMA Insertion Date/Time: 12/14/2018 12:55 PM Performed by: Vista Deck, CRNA Pre-anesthesia Checklist: Patient identified, Patient being monitored, Emergency Drugs available, Timeout performed and Suction available Patient Re-evaluated:Patient Re-evaluated prior to induction Oxygen Delivery Method: Circle System Utilized Preoxygenation: Pre-oxygenation with 100% oxygen Induction Type: IV induction Ventilation: Mask ventilation without difficulty LMA: LMA inserted LMA Size: 4.0 Number of attempts: 1 Placement Confirmation: positive ETCO2 and breath sounds checked- equal and bilateral Tube secured with: Tape Dental Injury: Teeth and Oropharynx as per pre-operative assessment

## 2018-12-14 NOTE — H&P (Signed)
H&P  Chief Complaint: Difficulty urinating  History of Present Illness: 78 year old male presents for Urolift procedure for symptomatic BPH despite dual medical therapy.  Past Medical History:  Diagnosis Date  . AAA (abdominal aortic aneurysm) ([place])   . Back pain    10/2010  . Back pain    November, 2013  . Back pain    decompression belt bought off of TV  . CAD (coronary artery disease)   . Carotid artery disease ([place])    Doppler, November, 2013, 0-39% bilateral  . Carotid bruit    November, 2013  . Cataract   . DDD (degenerative disc disease), lumbar   . Dyslipidemia   . Ejection fraction    EF 55%, nuclear 2009  . GERD (gastroesophageal reflux disease)   . History of kidney stones   . HTN (hypertension)   . Hx of CABG    2004  . 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 Hypertension   . MGUS (monoclonal gammopathy of unknown significance)   . Myocardial infarction ([place])   . RBBB (right bundle branch block with left anterior fascicular block)   . Spinal stenosis, lumbar    3-4  . Temporary low platelet count ([place])   . Thrombocytopenia ([place])    Dr. Ralene Ok    Past Surgical History:  Procedure Laterality Date  . BACK SURGERY    . COLONOSCOPY    . COLONOSCOPY WITH PROPOFOL N/A 07/13/2017   Procedure: COLONOSCOPY WITH PROPOFOL;  Surgeon: Manus Gunning, MD;  Location: [facility];  Service: Gastroenterology;  Laterality: N/A;  . CORONARY ARTERY BYPASS GRAFT  2004  . DECOMPRESSIVE LUMBAR LAMINECTOMY LEVEL 1 N/A 09/04/2016   Procedure: DECOMPRESSION L3 - L4;  Surgeon: Melina Schools, MD;  Location: [facility];  Service: Orthopedics;  Laterality: N/A;  . EYE SURGERY  2013   bilateral cataract extraction w/ IOL (Dr. Celene Squibb), retina surgery  . JOINT REPLACEMENT    . LUMBAR LAMINECTOMY/DECOMPRESSION MICRODISCECTOMY  09/04/2016   L3-4  . TOTAL HIP ARTHROPLASTY Right 10/2003    Home Medications:  Allergies as of 12/14/2018      Reactions    Penicillins Other (See Comments)   Has patient had a PCN reaction causing immediate rash, facial/tongue/throat swelling, SOB or lightheadedness with hypotension: Yes Has patient had a PCN reaction causing severe rash involving mucus membranes or skin necrosis: No Has patient had a PCN reaction that required hospitalization No Has patient had a PCN reaction occurring within the last 10 years: No If all of the above answers are "NO", then may proceed with Cephalosporin use. Passed out      Medication List    Notice   Cannot display discharge medications because the patient has not yet been admitted.     Allergies:  Allergies  Allergen Reactions  . Penicillins Other (See Comments)    Has patient had a PCN reaction causing immediate rash, facial/tongue/throat swelling, SOB or lightheadedness with hypotension: Yes Has patient had a PCN reaction causing severe rash involving mucus membranes or skin necrosis: No Has patient had a PCN reaction that required hospitalization No Has patient had a PCN reaction occurring within the last 10 years: No If all of the above answers are "NO", then may proceed with Cephalosporin use.  Passed out    Family History  Problem Relation Age of Onset  . Kidney disease Mother   . COPD Mother   . Stroke Father  cerebral hemorrhage  . Heart disease Brother   . Heart disease Brother   . Arthritis Brother   . Cancer Paternal Grandfather     Social History:  reports that he has been smoking cigarettes. He has a 22.50 pack-year smoking history. He has never used smokeless tobacco. He reports that he does not drink alcohol or use drugs.  ROS: A complete review of systems was performed.  All systems are negative except for pertinent findings as noted.  Physical Exam:  Vital signs in last 24 hours:   Constitutional:  Alert and oriented, No acute distress Cardiovascular: Regular rate  Respiratory: Normal respiratory effort GI: Abdomen is soft,  nontender, nondistended, no abdominal masses. No CVAT.  Genitourinary: Normal male phallus, testes are descended bilaterally and non-tender and without masses, scrotum is normal in appearance without lesions or masses, perineum is normal on inspection. Lymphatic: No lymphadenopathy Neurologic: Grossly intact, no focal deficits Psychiatric: Normal mood and affect  Laboratory Data:  No results for input(s): WBC, HGB, HCT, PLT in the last 72 hours.  No results for input(s): NA, K, CL, GLUCOSE, BUN, CALCIUM, CREATININE in the last 72 hours.  Invalid input(s): CO3   No results found for this or any previous visit (from the past 24 hour(s)). No results found for this or any previous visit (from the past 240 hour(s)).  Renal Function: Recent Labs    12/09/18 1257  CREATININE 0.68   Estimated Creatinine Clearance: 78.6 mL/min (by C-G formula based on SCr of 0.68 mg/dL).  Radiologic Imaging: No results found.  Impression/Assessment:  BPH with obstruction Incomplete bladder emptying  Plan:  Urolift procedure

## 2018-12-14 NOTE — Interval H&P Note (Signed)
History and Physical Interval Note:  12/14/2018 12:38 PM  [name] [name]  has presented today for surgery, with the diagnosis of BENIGN PROSTATIC HYPERPLASIA  The various methods of treatment have been discussed with the patient and family. After consideration of risks, benefits and other options for treatment, the patient has consented to  Procedure(s) with comments: CYSTOSCOPY WITH INSERTION OF UROLIFT (N/A) - 30 MINS as a surgical intervention .  The patient's history has been reviewed, patient examined, no change in status, stable for surgery.  I have reviewed the patient's chart and labs.  Questions were answered to the patient's satisfaction.     [name] [name]

## 2018-12-14 NOTE — Transfer of Care (Signed)
Immediate Anesthesia Transfer of Care Note  Patient: [name] [name]  Procedure(s) Performed: CYSTOSCOPY WITH INSERTION OF UROLIFT (N/A Perineum)  Patient Location: PACU  Anesthesia Type:General  Level of Consciousness: awake, alert  and patient cooperative  Airway & Oxygen Therapy: Patient Spontanous Breathing  Post-op Assessment: Report given to RN and Post -op Vital signs reviewed and stable  Post vital signs: Reviewed and stable  Last Vitals:  Vitals Value Taken Time  BP 109/71 12/14/2018  1:23 PM  Temp 36.3 C 12/14/2018  1:23 PM  Pulse 72 12/14/2018  1:24 PM  Resp 15 12/14/2018  1:24 PM  SpO2 97 % 12/14/2018  1:24 PM  Vitals shown include unvalidated device data.  Last Pain: There were no vitals filed for this visit.       Complications: No apparent anesthesia complications

## 2018-12-14 NOTE — Discharge Instructions (Signed)
Indwelling Urinary Catheter Care, Adult An indwelling urinary catheter is a thin tube that is put into your bladder. The tube helps to drain pee (urine) out of your body. The tube goes in through your urethra. Your urethra is where pee comes out of your body. Your pee will come out through the catheter, then it will go into a bag (drainage bag). Take good care of your catheter so it will work well. How to wear your catheter and bag Supplies needed  Sticky tape (adhesive tape) or a leg strap.  Alcohol wipe or soap and water (if you use tape).  A clean towel (if you use tape).  Large overnight bag.  Smaller bag (leg bag). Wearing your catheter Attach your catheter to your leg with tape or a leg strap.  Make sure the catheter is not pulled tight.  If a leg strap gets wet, take it off and put on a dry strap.  If you use tape to hold the bag on your leg: 1. Use an alcohol wipe or soap and water to wash your skin where the tape made it sticky before. 2. Use a clean towel to pat-dry that skin. 3. Use new tape to make the bag stay on your leg. Wearing your bags You should have been given a large overnight bag.  You may wear the overnight bag in the day or night.  Always have the overnight bag lower than your bladder.  Do not let the bag touch the floor.  Before you go to sleep, put a clean plastic bag in a wastebasket. Then hang the overnight bag inside the wastebasket. You should also have a smaller leg bag that fits under your clothes.  Always wear the leg bag below your knee.  Do not wear your leg bag at night. How to care for your skin and catheter Supplies needed  A clean washcloth.  Water and mild soap.  A clean towel. Caring for your skin and catheter      Clean the skin around your catheter every day: ? Wash your hands with soap and water. ? Wet a clean washcloth in warm water and mild soap. ? Clean the skin around your urethra. ? If you are male: ? Gently  spread the folds of skin around your vagina (labia). ? With the washcloth in your other hand, wipe the inner side of your labia on each side. Wipe from front to back. ? If you are male: ? Pull back any skin that covers the end of your penis (foreskin). ? With the washcloth in your other hand, wipe your penis in small circles. Start wiping at the tip of your penis, then move away from the catheter. ? With your free hand, hold the catheter close to where it goes into your body. ? Keep holding the catheter during cleaning so it does not get pulled out. ? With the washcloth in your other hand, clean the catheter. ? Only wipe downward on the catheter. ? Do not wipe upward toward your body. Doing this may push germs into your urethra and cause infection. ? Use a clean towel to pat-dry the catheter and the skin around it. Make sure to wipe off all soap. ? Wash your hands with soap and water.  Shower every day. Do not take baths.  Do not use cream, ointment, or lotion on the area where the catheter goes into your body, unless your doctor tells you to.  Do not use powders, sprays, or lotions  on your genital area.  Check your skin around the catheter every day for signs of infection. Check for: ? Redness, swelling, or pain. ? Fluid or blood. ? Warmth. ? Pus or a bad smell. How to empty the bag Supplies needed  Rubbing alcohol.  Gauze pad or cotton ball.  Tape or a leg strap. Emptying the bag Pour the pee out of your bag when it is ?- full, or at least 2-3 times a day. Do this for your overnight bag and your leg bag. 1. Wash your hands with soap and water. 2. Separate (detach) the bag from your leg. 3. Hold the bag over the toilet or a clean pail. Keep the bag lower than your hips and bladder. This is so the pee (urine) does not go back into the tube. 4. Open the pour spout. It is at the bottom of the bag. 5. Empty the pee into the toilet or pail. Do not let the pour spout touch any  surface. 6. Put rubbing alcohol on a gauze pad or cotton ball. 7. Use the gauze pad or cotton ball to clean the pour spout. 8. Close the pour spout. 9. Attach the bag to your leg with tape or a leg strap. 10. Wash your hands with soap and water. Follow instructions for cleaning the drainage bag:  From the product maker.  As told by your doctor. How to change the bag Supplies needed  Alcohol wipes.  A clean bag.  Tape or a leg strap. Changing the bag Replace your bag with a clean bag once a month. If it starts to leak, smell bad, or look dirty, change it sooner. 1. Wash your hands with soap and water. 2. Separate the dirty bag from your leg. 3. Pinch the catheter with your fingers so that pee does not spill out. 4. Separate the catheter tube from the bag tube where these tubes connect (at the connection valve). Do not let the tubes touch any surface. 5. Clean the end of the catheter tube with an alcohol wipe. Use a different alcohol wipe to clean the end of the bag tube. 6. Connect the catheter tube to the tube of the clean bag. 7. Attach the clean bag to your leg with tape or a leg strap. Do not make the bag tight on your leg. 8. Wash your hands with soap and water. General rules   Never pull on your catheter. Never try to take it out. Doing that can hurt you.  Always wash your hands before and after you touch your catheter or bag. Use a mild, fragrance-free soap. If you do not have soap and water, use hand sanitizer.  Always make sure there are no twists or bends (kinks) in the catheter tube.  Always make sure there are no leaks in the catheter or bag.  Drink enough fluid to keep your pee pale yellow.  Do not take baths, swim, or use a hot tub.  If you are male, wipe from front to back after you poop (have a bowel movement). Contact a doctor if:  Your pee is cloudy.  Your pee smells worse than usual.  Your catheter gets clogged.  Your catheter leaks.  Your  bladder feels full. Get help right away if:  You have redness, swelling, or pain where the catheter goes into your body.  You have fluid, blood, pus, or a bad smell coming from the area where the catheter goes into your body.  Your skin feels warm  where the catheter goes into your body.  You have a fever.  You have pain in your: ? Belly (abdomen). ? Legs. ? Lower back. ? Bladder.  You see blood in the catheter.  Your pee is pink or red.  You feel sick to your stomach (nauseous).  You throw up (vomit).  You have chills.  Your pee is not draining into the bag.  Your catheter gets pulled out. Summary  An indwelling urinary catheter is a thin tube that is placed into the bladder to help drain pee (urine) out of the body.  The catheter is placed into the part of the body that drains pee from the bladder (urethra).  Taking good care of your catheter will keep it working properly and help prevent problems.  Always wash your hands before and after touching your catheter or bag.  Never pull on your catheter or try to take it out. This information is not intended to replace advice given to you by your health care provider. Make sure you discuss any questions you have with your health care provider. Document Released: 03/21/2013 Document Revised: 05/17/2018 Document Reviewed: 07/10/2017 Elsevier Interactive Patient Education  2019 [place]. 1.  Expect to see some bloody urethral drainage as well as blood in the initial part of your urinary stream for a few days  2.  If you are on a medicine for your prostate i.e. tamsulosin, alfuzosin, doxazosin or terazosin, it is okay to stop that 2-3 days after your procedure  3.  If you are on finasteride, it is okay to stop that immediately  4.  Limit your exertional activity for approximately 2 weeks after the procedure.  If you are having no bloody drainage or pain at that point, you may liberalize your activities  5.  Keep your  follow-up appointment that is scheduled.  If you have problems before the appointment such as continued large clots in your urine, difficulty urinating or fever, contact us before at [phone]  Remove the cathter as directed Wednesday morning

## 2018-12-14 NOTE — Anesthesia Postprocedure Evaluation (Signed)
Anesthesia Post Note  Patient: [name] [name]  Procedure(s) Performed: CYSTOSCOPY WITH INSERTION OF UROLIFT (N/A Perineum)  Patient location during evaluation: PACU Anesthesia Type: General Level of consciousness: awake and alert and patient cooperative Pain management: satisfactory to patient Vital Signs Assessment: post-procedure vital signs reviewed and stable Respiratory status: spontaneous breathing Cardiovascular status: stable Postop Assessment: no apparent nausea or vomiting Anesthetic complications: no     Last Vitals:  Vitals:   12/14/18 1323  BP: 109/71  Pulse: 71  Resp: 11  Temp: (!) 36.3 C  SpO2: 99%    Last Pain: There were no vitals filed for this visit.               Drucie Opitz

## 2018-12-14 NOTE — Op Note (Signed)
Preoperative diagnosis: BPH with obstructive symptomatology.  Postoperative diagnosis: Same  Principal procedure: Urolift procedure, with the placement of 6 implants.  Surgeon: Diona Fanti  Anesthesia: Gen. with LMA  Complications: None  Drains: 18 French Foley catheter, to leg bag.  Estimated blood loss: Less than 25 mL  Indications: 78 -year-old male with obstructive symptomatology secondary to BPH.  The patient's symptoms have progressed, and he has requested further management.  Management options including TURP with resection/ablation of the prostate as well as Urolift were discussed.  The patient has chosen to have a Urolift procedure.  He has been instructed to the procedure as well as risks and complications which include but are not limited to infection, bleeding, and inadequate treatment with the Urolift procedure alone, anesthetic complications, among others.  He understands these and desires to proceed.  Findings: Using the 17 French cystoscope, urethra and bladder were inspected.  There were no urethral lesions.  Prostatic urethra was obstructed secondary to bilobar hypertrophy.  The bladder was inspected circumferentially.  This revealed normal findings with the exception of moderate trabeculations..  Description of procedure: The patient was properly identified in the holding area.  He received preoperative IV antibiotics.  He was taken to the operating room where general anesthetic was administered with the LMA.  He is placed in the dorsolithotomy position.  Genitalia and perineum were prepped and draped.  Proper timeout was performed.  A 36F cystoscope was inserted into the bladder. The cystoscopy bridge was replaced with a UroLift delivery device.The first treatment site was the patient's right side approximately 1.5cm distal to the bladder neck. The distal tip of the delivery device was then angled laterally approximately 20 degrees at this position to compress the lateral  lobe. The trigger was pulled, thereby deploying a needle containing the implant through the prostate. The needle was then retracted, allowing one end of the implant to be delivered to the capsular surface of the prostate. The implant was then tensioned to assure capsular seating and removal of slack monofilament. The device was then angled back toward midline and slowly advanced proximally until cystoscopic verification of the monofilament being centered in the delivery bay. The urethral end piece was then affixed to the monofilament thereby tailoring the size of the implant. Excess filament was then severed. The delivery device was then re-advanced into the bladder. The delivery device was then replaced with cystoscope and bridge and the implant location and opening effect was confirmed cystoscopically. The same procedure was then repeated on the left side, and 2 additional implants were delivered just proximal to the verumontanum, again one on right and one on left side of the prostate, following the same technique. 2 more implants were delivered--1 stacked at the bladder neck on the right and 1 mid-urethral on the left.   A final cystoscopy was conducted first to inspect the location and state of each implant and second, to confirm the presence of a continuous anterior channel was present through the prostatic urethra with irrigation flow turned off. 6 Implants were delivered in total.  Following this, the scope was removed and a 18 French Foley catheter was placed and hooked to dependent drainage.  He was then awakened and taken to the PACU in stable condition.  He tolerated the procedure well.

## 2018-12-14 NOTE — Anesthesia Preprocedure Evaluation (Signed)
Anesthesia Evaluation    Airway Mallampati: III       Dental  (+) Dental Advidsory Given   Pulmonary Current Smoker,    breath sounds clear to auscultation       Cardiovascular hypertension, + CAD, + Past MI and + CABG  + dysrhythmias  Rhythm:regular     Neuro/Psych    GI/Hepatic   Endo/Other    Renal/GU      Musculoskeletal   Abdominal   Peds  Hematology   Anesthesia Other Findings CABG 2004, RBBB Carotid disease, no CVA hx  Reproductive/Obstetrics                             Anesthesia Physical Anesthesia Plan  ASA: IV  Anesthesia Plan: General   Post-op Pain Management:    Induction:   PONV Risk Score and Plan:   Airway Management Planned:   Additional Equipment:   Intra-op Plan:   Post-operative Plan:   Informed Consent: I have reviewed the patients History and Physical, chart, labs and discussed the procedure including the risks, benefits and alternatives for the proposed anesthesia with the patient or authorized representative who has indicated his/her understanding and acceptance.     Plan Discussed with: Anesthesiologist  Anesthesia Plan Comments:         Anesthesia Quick Evaluation

## 2018-12-15 ENCOUNTER — Encounter (HOSPITAL_COMMUNITY): Payer: Self-pay | Admitting: Urology

## 2019-01-18 ENCOUNTER — Other Ambulatory Visit: Payer: Self-pay | Admitting: Cardiology

## 2019-01-18 MED ORDER — METOPROLOL TARTRATE 50 MG PO TABS: 50.0000 mg | ORAL_TABLET | Freq: Two times a day (BID) | ORAL | 0 refills | Status: DC

## 2019-01-18 MED ORDER — SIMVASTATIN 40 MG PO TABS: 40.0000 mg | ORAL_TABLET | Freq: Every day | ORAL | 0 refills | Status: DC

## 2019-01-26 DIAGNOSIS — M961 Postlaminectomy syndrome, not elsewhere classified: Secondary | ICD-10-CM | POA: Diagnosis not present

## 2019-02-01 ENCOUNTER — Ambulatory Visit (INDEPENDENT_AMBULATORY_CARE_PROVIDER_SITE_OTHER): Payer: PPO | Admitting: Urology

## 2019-02-01 DIAGNOSIS — R351 Nocturia: Secondary | ICD-10-CM | POA: Diagnosis not present

## 2019-02-01 DIAGNOSIS — N401 Enlarged prostate with lower urinary tract symptoms: Secondary | ICD-10-CM | POA: Diagnosis not present

## 2019-02-04 DIAGNOSIS — M961 Postlaminectomy syndrome, not elsewhere classified: Secondary | ICD-10-CM | POA: Diagnosis not present

## 2019-02-10 DIAGNOSIS — M961 Postlaminectomy syndrome, not elsewhere classified: Secondary | ICD-10-CM | POA: Diagnosis not present

## 2019-02-10 DIAGNOSIS — I10 Essential (primary) hypertension: Secondary | ICD-10-CM | POA: Diagnosis not present

## 2019-02-22 ENCOUNTER — Other Ambulatory Visit: Payer: Self-pay | Admitting: Anesthesiology

## 2019-02-25 DIAGNOSIS — M961 Postlaminectomy syndrome, not elsewhere classified: Secondary | ICD-10-CM | POA: Diagnosis not present

## 2019-02-25 DIAGNOSIS — G894 Chronic pain syndrome: Secondary | ICD-10-CM | POA: Diagnosis not present

## 2019-02-25 DIAGNOSIS — M5136 Other intervertebral disc degeneration, lumbar region: Secondary | ICD-10-CM | POA: Diagnosis not present

## 2019-02-25 DIAGNOSIS — Z79899 Other long term (current) drug therapy: Secondary | ICD-10-CM | POA: Diagnosis not present

## 2019-03-03 ENCOUNTER — Inpatient Hospital Stay (HOSPITAL_COMMUNITY): Admission: RE | Admit: 2019-03-03 | Payer: PPO | Source: Ambulatory Visit

## 2019-04-07 ENCOUNTER — Other Ambulatory Visit (HOSPITAL_COMMUNITY): Payer: PPO

## 2019-04-28 ENCOUNTER — Telehealth: Payer: Self-pay | Admitting: Internal Medicine

## 2019-04-28 NOTE — Telephone Encounter (Signed)
Call day 6/29 moved lab/fu to AM. Confirmed with patient.

## 2019-05-01 ENCOUNTER — Other Ambulatory Visit: Payer: Self-pay | Admitting: Cardiology

## 2019-05-01 DIAGNOSIS — I2510 Atherosclerotic heart disease of native coronary artery without angina pectoris: Secondary | ICD-10-CM

## 2019-05-01 DIAGNOSIS — I10 Essential (primary) hypertension: Secondary | ICD-10-CM

## 2019-05-05 ENCOUNTER — Telehealth: Payer: Self-pay | Admitting: *Deleted

## 2019-05-05 NOTE — Telephone Encounter (Signed)
Pt contacted off the recall list to schedule yearly f/u.[name] [name] Pt has agreed to virtual via video, with [name] [name], 05/09/2019 @ 10:30.       Virtual Visit Pre-Appointment Phone Call  "(Name), I am calling you today to discuss your upcoming appointment. We are currently trying to limit exposure to the virus that causes COVID-19 by seeing patients at home rather than in the office."  1. "What is the BEST phone number to call the day of the visit?" - include this in appointment notes  2. "Do you have or have access to (through a family member/friend) a smartphone with video capability that we can use for your visit?" a. If yes - list this number in appt notes as "cell" (if different from BEST phone #) and list the appointment type as a VIDEO visit in appointment notes b. If no - list the appointment type as a PHONE visit in appointment notes  3. Confirm consent - "In the setting of the current Covid19 crisis, you are scheduled for a (phone or video) visit with your provider on (date) at (time).  Just as we do with many in-office visits, in order for you to participate in this visit, we must obtain consent.  If you'd like, I can send this to your mychart (if signed up) or email for you to review.  Otherwise, I can obtain your verbal consent now.  All virtual visits are billed to your insurance company just like a normal visit would be.  By agreeing to a virtual visit, we'd like you to understand that the technology does not allow for your provider to perform an examination, and thus may limit your provider's ability to fully assess your condition. If your provider identifies any concerns that need to be evaluated in person, we will make arrangements to do so.  Finally, though the technology is pretty good, we cannot assure that it will always work on either your or our end, and in the setting of a video visit, we may have to convert it to a phone-only visit.  In either situation, we cannot ensure that we  have a secure connection.  Are you willing to proceed?" STAFF: Did the patient verbally acknowledge consent to telehealth visit? Document YES/NO here: YES   4. Advise patient to be prepared - "Two hours prior to your appointment, go ahead and check your blood pressure, pulse, oxygen saturation, and your weight (if you have the equipment to check those) and write them all down. When your visit starts, your provider will ask you for this information. If you have an Apple Watch or Kardia device, please plan to have heart rate information ready on the day of your appointment. Please have a pen and paper handy nearby the day of the visit as well."  5. Give patient instructions for MyChart download to smartphone OR [email or web address] as below if video visit (depending on what platform provider is using)  6. Inform patient they will receive a phone call 15 minutes prior to their appointment time (may be from unknown caller ID) so they should be prepared to answer    TELEPHONE CALL NOTE  [facility] has been deemed a candidate for a follow-up tele-health visit to limit community exposure during the Covid-19 pandemic. I spoke with the patient via phone to ensure availability of phone/video source, confirm preferred email & phone number, and discuss instructions and expectations.  I reminded [name] [name] to be prepared with any vital sign and/or heart  rhythm information that could potentially be obtained via home monitoring, at the time of his visit. I reminded [name] [name] to expect a phone call prior to his visit.  [name] [name], [place] 05/05/2019 9:25 AM   INSTRUCTIONS FOR DOWNLOADING THE MYCHART APP TO SMARTPHONE  - The patient must first make sure to have activated MyChart and know their login information - If Apple, go to [employer] and type in MyChart in the search bar and download the app. If Android, ask patient to go to [employer] and type in [place] in the search bar and  download the app. The app is free but as with any other app downloads, their phone may require them to verify saved payment information or Apple/Android password.  - The patient will need to then log into the app with their MyChart username and password, and select [facility] as their healthcare provider to link the account. When it is time for your visit, go to the MyChart app, find appointments, and click Begin Video Visit. Be sure to Select Allow for your device to access the Microphone and Camera for your visit. You will then be connected, and your provider will be with you shortly.  **If they have any issues connecting, or need assistance please contact MyChart service desk [phone])**  **If using a computer, in order to ensure the best quality for their visit they will need to use either of the following Internet Browsers: [employer], or Google Chrome**  IF USING DOXIMITY or DOXY.ME - The patient will receive a link just prior to their visit by text.     FULL LENGTH CONSENT FOR TELE-HEALTH VISIT   I hereby voluntarily request, consent and authorize [facility] and its employed or contracted physicians, physician assistants, nurse practitioners or other licensed health care professionals (the Practitioner), to provide me with telemedicine health care services (the "Services") as deemed necessary by the treating Practitioner. I acknowledge and consent to receive the Services by the Practitioner via telemedicine. I understand that the telemedicine visit will involve communicating with the Practitioner through live audiovisual communication technology and the disclosure of certain medical information by electronic transmission. I acknowledge that I have been given the opportunity to request an in-person assessment or other available alternative prior to the telemedicine visit and am voluntarily participating in the telemedicine visit.  I understand that I have the right to  withhold or withdraw my consent to the use of telemedicine in the course of my care at any time, without affecting my right to future care or treatment, and that the Practitioner or I may terminate the telemedicine visit at any time. I understand that I have the right to inspect all information obtained and/or recorded in the course of the telemedicine visit and may receive copies of available information for a reasonable fee.  I understand that some of the potential risks of receiving the Services via telemedicine include:  [name] [name] Delay or interruption in medical evaluation due to technological equipment failure or disruption; . Information transmitted may not be sufficient (e.g. poor resolution of images) to allow for appropriate medical decision making by the Practitioner; and/or  . In rare instances, security protocols could fail, causing a breach of personal health information.  Furthermore, I acknowledge that it is my responsibility to provide information about my medical history, conditions and care that is complete and accurate to the best of my ability. I acknowledge that Practitioner's advice, recommendations, and/or decision may be based on  factors not within their control, such as incomplete or inaccurate data provided by me or distortions of diagnostic images or specimens that may result from electronic transmissions. I understand that the practice of medicine is not an exact science and that Practitioner makes no warranties or guarantees regarding treatment outcomes. I acknowledge that I will receive a copy of this consent concurrently upon execution via email to the email address I last provided but may also request a printed copy by calling the office of [facility].    I understand that my insurance will be billed for this visit.   I have read or had this consent read to me. . I understand the contents of this consent, which adequately explains the benefits and risks of the Services being  provided via telemedicine.  . I have been provided ample opportunity to ask questions regarding this consent and the Services and have had my questions answered to my satisfaction. . I give my informed consent for the services to be provided through the use of telemedicine in my medical care  By participating in this telemedicine visit I agree to the above.

## 2019-05-08 NOTE — Progress Notes (Signed)
Virtual Visit via telephone Note   This visit type was conducted due to national recommendations for restrictions regarding the COVID-19 Pandemic (e.g. social distancing) in an effort to limit this patient's exposure and mitigate transmission in our community.  Due to his co-morbid illnesses, this patient is at least at moderate risk for complications without adequate follow up.  This format is felt to be most appropriate for this patient at this time.  All issues noted in this document were discussed and addressed.  A limited physical exam was performed with this format.  Please refer to the patient's chart for his consent to telehealth for [facility]   Date:  05/09/2019   ID:  [name] [name], DOB 09-14-1941, MRN [id number]  Patient Location: Home Provider Location: Home  PCP:  [name] Koch, MD  Cardiologist:  [name] Dawley, MD   Evaluation Performed:  Follow-Up Visit  Chief Complaint: 1 year follow-up, seen for Dr. Meda [name]  History of Present Illness:    [name] [name] is a 78 y.o. male with a history of CAD s/p CABGx5V (2004), tobacco abuse, thrombocytopenia/MGUS, HTN, HLD, RBBB/LPFB, recently diagnosed small AAA followed by Dr. Donnetta [name] and carotid artery disease.   [name] was last seen by his primary cardiologist on 03/12/2017 for follow-up and preoperative clearance for back surgery.  Lumbar spine films he was found to have a 4 cm aneurysm which was incompletely visualized and therefore was sent to Dr. Donnetta [name] for further evaluation.  Abdominal ultrasound performed on 07/22/2016 showed 4.0 x 3.7 cm and 1.71.7 right common iliac artery dilatation. This was felt to be small and he was asymptomatic.  Recommendations were for lifelong ultrasound follow-ups.  He was cleared for back surgery from a AAA standpoint.  He was then seen by Dr. Meda [name] on 01/12/2017 and was noted to be completely asymptomatic with no reports of chest pain, shortness of breath, palpitations or  dizziness. He was compliant with his medications.  He continued to smoke cigarettes.  His only complaint at that time was back pain which was limiting his physical activities.  He had last undergone lumbar spine surgery in September 2017 with worsening of symptoms.  When seen for preoperative clearance on 03/12/2018 he continued to do well from a cardiac standpoint.  Was doing water aerobics secondary to chronic lumbar back pain. Of note, nuclear stress test performed in 2011 revealed no ischemia.  His LVEF was noted to be 55%.    Today he is doing well from a cardiac perspective. He denies chest pain, palpitations, SOB, PND, LE swelling, orthopnea, dizziness or syncope.  Continues to be fairly active.  Has not used nitroglycerin.  He reports that he has an upcoming procedure for a lumbar spine simulator on Friday to help with chronic back pain.  He continues to smoke about 1/2 pack/day.  We discussed cessation.  His BP is elevated today at 164/96.  He does not check his BP on a regular basis however when he does he states that his SBP is in the 150-160 range.  We discussed lisinopril titration and proper BP monitoring with a log with close follow-up.  He has a follow-up 06/06/2019 with his oncologist/hematologist for lab work. We will have lab work added to this time.    The patient does not have symptoms concerning for COVID-19 infection (fever, chills, cough, or new shortness of breath).   Past Medical History:  Diagnosis Date  . AAA (abdominal aortic aneurysm) ([place])   . Back pain  10/2010  . Back pain    November, 2013  . Back pain    decompression belt bought off of TV  . CAD (coronary artery disease)   . Carotid artery disease ([place])    Doppler, November, 2013, 0-39% bilateral  . Carotid bruit    November, 2013  . Cataract   . DDD (degenerative disc disease), lumbar   . Dyslipidemia   . Ejection fraction    EF 55%, nuclear 2009  . GERD (gastroesophageal reflux disease)   . History of  kidney stones   . HTN (hypertension)   . Hx of CABG    2004  . 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 Hypertension   . MGUS (monoclonal gammopathy of unknown significance)   . Myocardial infarction ([place])   . RBBB (right bundle branch block with left anterior fascicular block)   . Spinal stenosis, lumbar    3-4  . Temporary low platelet count ([place])   . Thrombocytopenia ([place])    [name] [name]   Past Surgical History:  Procedure Laterality Date  . BACK SURGERY    . COLONOSCOPY    . COLONOSCOPY WITH PROPOFOL N/A 07/13/2017   Procedure: COLONOSCOPY WITH PROPOFOL;  Surgeon: Manus Gunning, MD;  Location: [facility];  Service: Gastroenterology;  Laterality: N/A;  . CORONARY ARTERY BYPASS GRAFT  2004  . CYSTOSCOPY WITH INSERTION OF UROLIFT N/A 12/14/2018   Procedure: CYSTOSCOPY WITH INSERTION OF UROLIFT;  Surgeon: Franchot Gallo, MD;  Location: [facility];  Service: Urology;  Laterality: N/A;  . DECOMPRESSIVE LUMBAR LAMINECTOMY LEVEL 1 N/A 09/04/2016   Procedure: DECOMPRESSION L3 - L4;  Surgeon: Melina Schools, MD;  Location: [facility];  Service: Orthopedics;  Laterality: N/A;  . EYE SURGERY  2013   bilateral cataract extraction w/ IOL (Dr. Celene Squibb), retina surgery  . JOINT REPLACEMENT    . LUMBAR LAMINECTOMY/DECOMPRESSION MICRODISCECTOMY  09/04/2016   L3-4  . TOTAL HIP ARTHROPLASTY Right 10/2003     Current Meds  Medication Sig  . docusate sodium (COLACE) 50 MG capsule Take 150 mg by mouth at bedtime.  Marland Kitchen esomeprazole (NEXIUM) 20 MG capsule Take 20 mg by mouth daily at 12 noon.  . fluticasone (FLONASE) 50 MCG/ACT nasal spray PLACE 2 SPRAYS INTO EACH NOSTRIL EVERY DAY  . metoprolol tartrate (LOPRESSOR) 50 MG tablet Take 1 tablet (50 mg total) by mouth 2 (two) times daily. Please make yearly appt with Dr. Meda [name] for April for future refills. 1st attempt  . Multiple Vitamins-Minerals (CENTRUM SILVER ULTRA MENS PO) Take 1 tablet by mouth daily.   .  nitroGLYCERIN (NITROSTAT) 0.4 MG SL tablet Place 1 tablet (0.4 mg total) under the tongue every 5 (five) minutes as needed for chest pain.  . simvastatin (ZOCOR) 40 MG tablet Take 1 tablet (40 mg total) by mouth at bedtime. Please make yearly appt with Dr. Meda [name] for April for future refills. 1st attempt  . traMADol (ULTRAM) 50 MG tablet Take 100 mg by mouth every 8 (eight) hours as needed for moderate pain.   . [DISCONTINUED] lisinopril (ZESTRIL) 10 MG tablet TAKE 1 (10 MG TOTAL) BY MOUTH DAILY    Allergies:   Penicillins   Social History   Tobacco Use  . Smoking status: Heavy Tobacco Smoker    Packs/day: 0.50    Years: 45.00    Pack years: 22.50    Types: Cigarettes  . Smokeless tobacco: Never Used  . Tobacco comment: half pack  day  Substance Use Topics  . Alcohol use: No  . Drug use: No    Family Hx: The patient's family history includes Arthritis in his brother; COPD in his mother; Cancer in his paternal grandfather; Heart disease in his brother and brother; Kidney disease in his mother; Stroke in his father.  ROS:   Please see the history of present illness.     All other systems reviewed and are negative.  Prior CV studies:   The following studies were reviewed today:  None   Labs/Other Tests and Data Reviewed:    EKG:  An ECG dated 03/12/2018 was personally reviewed today and demonstrated:  NSR with first-degree AV block and RBBB, known with no change from prior tracing  Recent Labs: 06/03/2018: ALT 19 12/09/2018: BUN 13; Creatinine, Ser 0.68; Hemoglobin 13.9; Platelets 207; Potassium 4.0; Sodium 137   Recent Lipid Panel Lab Results  Component Value Date/Time   CHOL 142 01/31/2016 08:47 AM   TRIG 77.0 01/31/2016 08:47 AM   HDL 48.50 01/31/2016 08:47 AM   CHOLHDL 3 01/31/2016 08:47 AM   LDLCALC 78 01/31/2016 08:47 AM    Wt Readings from Last 3 Encounters:  05/09/19 168 lb (76.2 kg)  12/09/18 170 lb (77.1 kg)  06/03/18 173 lb 8 oz (78.7 kg)     Objective:     Vital Signs:  BP (!) 164/96   Pulse (!) 57   Ht 5\' 10"  (1.778 m)   Wt 168 lb (76.2 kg)   BMI 24.11 kg/m    VITAL SIGNS:  reviewed GEN:  no acute distress RESPIRATORY:  normal respiratory effort, symmetric expansion NEURO:  alert and oriented x 3, no obvious focal deficit PSYCH:  normal affect  ASSESSMENT & PLAN:    1.  CAD s/p CABG 2004: -Continue ASA, statin, beta-blocker -Denies anginal symptoms, no ischemic work-up needed -No need for SL NTG -ASA currently on hold for upcoming lumbar stimulator.  Plan to resume once cleared from a post procedure perspective.  2.  HTN: -Elevated, 164/96 -Patient does not take BP on a regular basis however states that when he does his SBP is in the 1 50-1 60 range -We will increase lisinopril to 20 mg daily -Continue Lopressor 50 mg twice daily given HR 57 -Discussed proper BP technique including 1 hour post antihypertensive therapy, 15 minutes of quiet sitting prior to BP reading -He is to keep a BP log and we will have him seen virtually for reassessment in 2 to 3 weeks -May need further medication titration or addition of another agent -Consider amlodipine -Last creatinine, 0.68 on 12/09/2018  3.  HLD: -Last LDL, 78 on 01/31/2016 -Needs repeat lipid/LFTs>> will have added onto heme/ oncology lab work 06/06/2019 -Continue simvastatin 40 mg  4.  Carotid artery disease: -Minimal disease in 2015>>> no need to repeat given no symptoms  5.  Tobacco abuse: -Smoking cessation strongly encouraged -Continues to smoke approximately half pack per day -Discussed risk  6.  Thrombocytopenia/MGUS: -Stable>>> followed closely by heme/oncology -Last labs 12/2018 -Has appointment 06/06/2019  7.  AAA: -Followed closely with serial abdominal ultrasounds, Dr. Donnetta [name] -AAA duplex, 09/08/2017 with measurement of 3.9 x 3.9 cm and aneurysmal iliac>> no significant change from 2017 -Goal is  for tighter BP control>>> reassessment in 2 to 3 weeks   COVID-19  Education: The signs and symptoms of COVID-19 were discussed with the patient and how to seek care for testing (follow up with PCP or arrange E-visit). The importance of social  distancing was discussed today.  Time:   Today, I have spent 20 minutes with the patient with telehealth technology discussing the above problems.     Medication Adjustments/Labs and Tests Ordered: Current medicines are reviewed at length with the patient today.  Concerns regarding medicines are outlined above.   Tests Ordered: Orders Placed This Encounter  Procedures  . Lipid panel  . Hepatic function panel    Medication Changes: Meds ordered this encounter  Medications  . lisinopril (ZESTRIL) 20 MG tablet    Sig: Take 1 tablet (20 mg total) by mouth daily.    Dispense:  90 tablet    Refill:  3    Disposition:  Follow up APP in 2 to 3 weeks, Dr. Meda [name] in 6 months  Signed, [name], NP  05/09/2019 10:59 AM    [place]

## 2019-05-09 ENCOUNTER — Telehealth (INDEPENDENT_AMBULATORY_CARE_PROVIDER_SITE_OTHER): Payer: PPO | Admitting: Cardiology

## 2019-05-09 ENCOUNTER — Encounter: Payer: Self-pay | Admitting: Cardiology

## 2019-05-09 ENCOUNTER — Other Ambulatory Visit: Payer: Self-pay

## 2019-05-09 VITALS — BP 164/96 | HR 57 | Ht 70.0 in | Wt 168.0 lb

## 2019-05-09 DIAGNOSIS — I779 Disorder of arteries and arterioles, unspecified: Secondary | ICD-10-CM

## 2019-05-09 DIAGNOSIS — I2510 Atherosclerotic heart disease of native coronary artery without angina pectoris: Secondary | ICD-10-CM

## 2019-05-09 DIAGNOSIS — I714 Abdominal aortic aneurysm, without rupture, unspecified: Secondary | ICD-10-CM

## 2019-05-09 DIAGNOSIS — E785 Hyperlipidemia, unspecified: Secondary | ICD-10-CM

## 2019-05-09 DIAGNOSIS — Z720 Tobacco use: Secondary | ICD-10-CM

## 2019-05-09 DIAGNOSIS — I10 Essential (primary) hypertension: Secondary | ICD-10-CM | POA: Diagnosis not present

## 2019-05-09 MED ORDER — LISINOPRIL 20 MG PO TABS: 20.0000 mg | ORAL_TABLET | Freq: Every day | ORAL | 3 refills | Status: DC

## 2019-05-09 NOTE — Patient Instructions (Signed)
Medication Instructions:  Your physician has recommended you make the following change in your medication:  1. INCREASE LISINOPRIL TO 20 MG DAILY.   If you need a refill on your cardiac medications before your next appointment, please call your pharmacy.   Lab work: TO BE DONE AT DR. MOHAMMED OFFICE:  LIPIDS, LFTS  If you have labs (blood work) drawn today and your tests are completely normal, you will receive your results only by: Marland Kitchen MyChart Message (if you have MyChart) OR . A paper copy in the mail If you have any lab test that is abnormal or we need to change your treatment, we will call you to review the results.  Testing/Procedures: NONE  Follow-Up: At [facility], you and your health needs are our priority.  As part of our continuing mission to provide you with exceptional heart care, we have created designated Provider Care Teams.  These Care Teams include your primary Cardiologist (physician) and Advanced Practice Providers (APPs -  Physician Assistants and Nurse Practitioners) who all work together to provide you with the care you need, when you need it. You will need a follow up appointment in 6 months.  Please call our office 2 months in advance to schedule this appointment.  You may see [name], MD or one of the following Advanced Practice Providers on your designated Care Team:   Dante, PA-C Melina Copa, PA-C . Ermalinda Barrios, PA-C

## 2019-05-10 NOTE — Progress Notes (Signed)
[id number] [place], [place] - [address] [place] [address] Phone: [phone] Fax: [phone]      Your procedure is scheduled on June 5  Report to [facility] Main Entrance "A" at 0530 A.M., and check in at the Admitting office.  Call this number if you have problems the morning of surgery:  [phone]  Call [phone] if you have any questions prior to your surgery date Monday-Friday 8am-4pm    Remember:  Do not eat or drink after midnight.  Take these medicines the morning of surgery with A SIP OF WATER  esomeprazole (NEXIUM)  fluticasone (FLONASE) metoprolol tartrate (LOPRESSOR) simvastatin (ZOCOR) traMADol (ULTRAM)  Follow your surgeon's instructions on when to stop Aspirin.  If no instructions were given by your surgeon then you will need to call the office to get those instructions.     7 days prior to surgery STOP taking any Aspirin (unless otherwise instructed by your surgeon), Aleve, Naproxen, Ibuprofen, Motrin, Advil, Goody's, BC's, all herbal medications, fish oil, and all vitamins.    The Morning of Surgery  Do not wear jewelry, make-up or nail polish.  Do not wear lotions, powders, or perfumes/colognes, or deodorant  Do not shave 48 hours prior to surgery.  Men may shave face and neck.  Do not bring valuables to the hospital.  [facility] is not responsible for any belongings or valuables.  If you are a smoker, DO NOT Smoke 24 hours prior to surgery IF you wear a CPAP at night please bring your mask, tubing, and machine the morning of surgery   Remember that you must have someone to transport you home after your surgery, and remain with you for 24 hours if you are discharged the same day.   Contacts, glasses, hearing aids, dentures or bridgework may not be worn into surgery.    Leave your suitcase in the car.  After surgery it may be brought to your room.  For patients admitted  to the hospital, discharge time will be determined by your treatment team.  Patients discharged the day of surgery will not be allowed to drive home.    Special instructions:   [place]- Preparing For Surgery  Before surgery, you can play an important role. Because skin is not sterile, your skin needs to be as free of germs as possible. You can reduce the number of germs on your skin by washing with CHG (chlorahexidine gluconate) Soap before surgery.  CHG is an antiseptic cleaner which kills germs and bonds with the skin to continue killing germs even after washing.    Oral Hygiene is also important to reduce your risk of infection.  Remember - BRUSH YOUR TEETH THE MORNING OF SURGERY WITH YOUR REGULAR TOOTHPASTE  Please do not use if you have an allergy to CHG or antibacterial soaps. If your skin becomes reddened/irritated stop using the CHG.  Do not shave (including legs and underarms) for at least 48 hours prior to first CHG shower. It is OK to shave your face.  Please follow these instructions carefully.   1. Shower the NIGHT BEFORE SURGERY and the MORNING OF SURGERY with CHG Soap.   2. If you chose to wash your hair, wash your hair first as usual with your normal shampoo.  3. After you shampoo, rinse your hair and body thoroughly to remove the shampoo.  4. Use CHG as you would any other liquid soap. You can  apply CHG directly to the skin and wash gently with a scrungie or a clean washcloth.   5. Apply the CHG Soap to your body ONLY FROM THE NECK DOWN.  Do not use on open wounds or open sores. Avoid contact with your eyes, ears, mouth and genitals (private parts). Wash Face and genitals (private parts)  with your normal soap.   6. Wash thoroughly, paying special attention to the area where your surgery will be performed.  7. Thoroughly rinse your body with warm water from the neck down.  8. DO NOT shower/wash with your normal soap after using and rinsing off the CHG  Soap.  9. Pat yourself dry with a CLEAN TOWEL.  10. Wear CLEAN PAJAMAS to bed the night before surgery, wear comfortable clothes the morning of surgery  11. Place CLEAN SHEETS on your bed the night of your first shower and DO NOT SLEEP WITH PETS.    Day of Surgery:  Do not apply any deodorants/lotions.  Please wear clean clothes to the hospital/surgery center.   Remember to brush your teeth WITH YOUR REGULAR TOOTHPASTE.   Please read over the following fact sheets that you were given.

## 2019-05-11 ENCOUNTER — Encounter (HOSPITAL_COMMUNITY)
Admission: RE | Admit: 2019-05-11 | Discharge: 2019-05-11 | Disposition: A | Discharge status: Home / Self Care | Payer: PPO | Source: Ambulatory Visit | Attending: Anesthesiology | Admitting: Anesthesiology

## 2019-05-11 ENCOUNTER — Other Ambulatory Visit (HOSPITAL_COMMUNITY)
Admission: RE | Admit: 2019-05-11 | Discharge: 2019-05-11 | Disposition: A | Discharge status: Home / Self Care | Payer: PPO | Source: Ambulatory Visit | Attending: Anesthesiology | Admitting: Anesthesiology

## 2019-05-11 ENCOUNTER — Other Ambulatory Visit: Payer: Self-pay

## 2019-05-11 ENCOUNTER — Encounter (HOSPITAL_COMMUNITY): Payer: Self-pay

## 2019-05-11 DIAGNOSIS — E785 Hyperlipidemia, unspecified: Secondary | ICD-10-CM | POA: Diagnosis not present

## 2019-05-11 DIAGNOSIS — I2510 Atherosclerotic heart disease of native coronary artery without angina pectoris: Secondary | ICD-10-CM | POA: Diagnosis not present

## 2019-05-11 DIAGNOSIS — Z7982 Long term (current) use of aspirin: Secondary | ICD-10-CM | POA: Diagnosis not present

## 2019-05-11 DIAGNOSIS — Z96641 Presence of right artificial hip joint: Secondary | ICD-10-CM | POA: Diagnosis not present

## 2019-05-11 DIAGNOSIS — Z96651 Presence of right artificial knee joint: Secondary | ICD-10-CM | POA: Diagnosis not present

## 2019-05-11 DIAGNOSIS — F17210 Nicotine dependence, cigarettes, uncomplicated: Secondary | ICD-10-CM | POA: Diagnosis not present

## 2019-05-11 DIAGNOSIS — I252 Old myocardial infarction: Secondary | ICD-10-CM | POA: Diagnosis not present

## 2019-05-11 DIAGNOSIS — M1990 Unspecified osteoarthritis, unspecified site: Secondary | ICD-10-CM | POA: Diagnosis not present

## 2019-05-11 DIAGNOSIS — G894 Chronic pain syndrome: Secondary | ICD-10-CM | POA: Diagnosis not present

## 2019-05-11 DIAGNOSIS — Z79899 Other long term (current) drug therapy: Secondary | ICD-10-CM | POA: Diagnosis not present

## 2019-05-11 DIAGNOSIS — I10 Essential (primary) hypertension: Secondary | ICD-10-CM | POA: Diagnosis not present

## 2019-05-11 DIAGNOSIS — I714 Abdominal aortic aneurysm, without rupture: Secondary | ICD-10-CM | POA: Diagnosis not present

## 2019-05-11 DIAGNOSIS — Z951 Presence of aortocoronary bypass graft: Secondary | ICD-10-CM | POA: Diagnosis not present

## 2019-05-11 DIAGNOSIS — D696 Thrombocytopenia, unspecified: Secondary | ICD-10-CM | POA: Diagnosis not present

## 2019-05-11 DIAGNOSIS — I739 Peripheral vascular disease, unspecified: Secondary | ICD-10-CM | POA: Diagnosis not present

## 2019-05-11 DIAGNOSIS — K219 Gastro-esophageal reflux disease without esophagitis: Secondary | ICD-10-CM | POA: Diagnosis not present

## 2019-05-11 DIAGNOSIS — Z1159 Encounter for screening for other viral diseases: Secondary | ICD-10-CM | POA: Diagnosis not present

## 2019-05-11 DIAGNOSIS — M48061 Spinal stenosis, lumbar region without neurogenic claudication: Secondary | ICD-10-CM | POA: Diagnosis not present

## 2019-05-11 DIAGNOSIS — M5136 Other intervertebral disc degeneration, lumbar region: Secondary | ICD-10-CM | POA: Diagnosis not present

## 2019-05-11 DIAGNOSIS — M961 Postlaminectomy syndrome, not elsewhere classified: Secondary | ICD-10-CM | POA: Diagnosis not present

## 2019-05-11 LAB — BASIC METABOLIC PANEL
Anion gap: 6 (ref 5–15)
BUN: 12 mg/dL (ref 8–23)
CO2: 24 mmol/L (ref 22–32)
Calcium: 8.9 mg/dL (ref 8.9–10.3)
Chloride: 109 mmol/L (ref 98–111)
Creatinine, Ser: 0.74 mg/dL (ref 0.61–1.24)
GFR calc Af Amer: >60 mL/min (ref >60)
GFR calc non Af Amer: >60 mL/min (ref >60)
Glucose, Bld: 111 mg/dL — ABNORMAL HIGH (ref 70–99)
Potassium: 3.7 mmol/L (ref 3.5–5.1)
Sodium: 139 mmol/L (ref 135–145)

## 2019-05-11 LAB — CBC
HCT: 44.9 % (ref 39.0–52.0)
Hemoglobin: 14.4 g/dL (ref 13.0–17.0)
MCH: 30.7 pg (ref 26.0–34.0)
MCHC: 32.1 g/dL (ref 30.0–36.0)
MCV: 95.7 fL (ref 80.0–100.0)
Platelets: 206 10*3/uL (ref 150–400)
RBC: 4.69 MIL/uL (ref 4.22–5.81)
RDW: 13.2 % (ref 11.5–15.5)
WBC: 9.3 10*3/uL (ref 4.0–10.5)
nRBC: 0 % (ref 0.0–0.2)

## 2019-05-11 LAB — SARS CORONAVIRUS 2 BY RT PCR (HOSPITAL ORDER, PERFORMED IN ~~LOC~~ HOSPITAL LAB): SARS Coronavirus 2: NEGATIVE

## 2019-05-11 LAB — SURGICAL PCR SCREEN
MRSA, PCR: NEGATIVE
Staphylococcus aureus: POSITIVE — AB

## 2019-05-11 NOTE — Progress Notes (Signed)
Called patient to notify him of the Surgical PCR results.  Patient was positive for Staph.  Surgery on Friday, 05/13/19, will treat with Profend on DOS.  Patient verbalized understanding.

## 2019-05-11 NOTE — Progress Notes (Addendum)
  PCP - Dr Pricilla Holm at [place] - Dr Reginal Lutes, NP Oncology: Dr Curt Bears at [facility]  Chest x-ray - Denies (last one documented 01/01/10) EKG - 05/11/19 Stress Test -  08/08/2008 - Dr Ron Parker  ECHO - Denies Cardiac Cath - ?1990s in [place], [place] - unknown MD/practice name  ICD Pacemaker/Loop-Denies  Sleep Study - Denies CPAP - N/P  Aspirin Instructions: Patient's last dose was 05/04/19 per his MD's instructions.  Anesthesia review: Yes  Patient denies shortness of breath, fever, cough and chest pain at PAT appointment  Patient verbalized understanding of instructions that were given to them at the PAT appointment. Patient was also instructed that they will need to review over the PAT instructions again at home before surgery.  Patient informed of the Norco that is currently in effect.  Patient v

## 2019-05-12 NOTE — Anesthesia Preprocedure Evaluation (Addendum)
Anesthesia Evaluation  Patient identified by MRN, date of birth, ID band Patient awake    Reviewed: Allergy & Precautions, NPO status , Patient's Chart, lab work & pertinent test results, reviewed documented beta blocker date and time   Airway Mallampati: III  TM Distance: >3 FB Neck ROM: Full    Dental no notable dental hx. (+) Partial Upper, Dental Advisory Given,    Pulmonary neg pulmonary ROS, Current Smoker,    Pulmonary exam normal breath sounds clear to auscultation       Cardiovascular hypertension, Pt. on medications and Pt. on home beta blockers + CAD, + Past MI, + CABG (2004) and + Peripheral Vascular Disease (b/l carotid stenosis 0-39%, known AAA)  Normal cardiovascular exam Rhythm:Regular Rate:Normal  TTE 2004 EF 55-60%, no valvular abnormalities  MI, s/p PCI 1993; s/p CABG x 5: LIMA-LAD, SVG-DIAG, SVG-OM1, SVG-PDA-PLA  04/10/03),   EKG bifascicular block (RBBB, LAFB)   carotid bruits (1-39% BICA stenosis 2015)  AAA (3.99 cm 09/2017)   Neuro/Psych negative neurological ROS  negative psych ROS   GI/Hepatic Neg liver ROS, GERD  Medicated,  Endo/Other  negative endocrine ROS  Renal/GU negative Renal ROS  negative genitourinary   Musculoskeletal  (+) Arthritis , Osteoarthritis,    Abdominal   Peds negative pediatric ROS (+)  Hematology negative hematology ROS (+) H/o MGUS   Anesthesia Other Findings   Reproductive/Obstetrics negative OB ROS                          Anesthesia Physical Anesthesia Plan  ASA: III  Anesthesia Plan: General   Post-op Pain Management:    Induction: Intravenous  PONV Risk Score and Plan: 0 and Ondansetron and Dexamethasone  Airway Management Planned: Oral ETT  Additional Equipment:   Intra-op Plan:   Post-operative Plan: Extubation in OR  Informed Consent: I have reviewed the patients History and Physical, chart, labs and discussed  the procedure including the risks, benefits and alternatives for the proposed anesthesia with the patient or authorized representative who has indicated his/her understanding and acceptance.     Dental advisory given  Plan Discussed with: CRNA  Anesthesia Plan Comments: (PAT note written 05/12/2019 by Myra Gianotti, PA-C. )      Anesthesia Quick Evaluation

## 2019-05-12 NOTE — Progress Notes (Signed)
Anesthesia Chart Review:  Case:  [id number] Date/Time:  05/13/19 0715   Procedure:  LUMBAR SPINAL CORD STIMULATOR INSERTION (N/A ) - LUMBAR SPINAL CORD STIMULATOR INSERTION   Anesthesia type:  Monitor Anesthesia Care   Pre-op diagnosis:  Lumbar post laminectomy syndrome   Location:  [facility] OR ROOM 03 / [place] [facility]   Surgeon:  Clydell Hakim, MD      DISCUSSION: Patient is a 78 year old male scheduled for the above procedure.  History includes CAD (MI, s/p PCI 1993; s/p CABG x 5: LIMA-LAD, SVG-DIAG, SVG-OM1, SVG-PDA-PLA  04/10/03), bifascicular block (RBBB, LAFB), carotid bruits (1-39% BICA stenosis 2015), AAA (3.99 cm 09/2017), HTN, HLD, thrombocytopenia/MGUS (PLT count WNL since 05/2016), GERD, smoking, L3-4 decompression 09/04/16, BPH (s/p Urolift procedure with placement of 6 implants 12/14/18).   Patient last evaluated by cardiology on 05/09/19 by [name], NP. He was doing well from a cardiac standpoint. Smoking cessation discussed. She also increased lisinopril due to SBP 160's. She was aware of surgery plans and that ASA on hold for procedure and recommended ASA be resumed post-operatively once cleared from the surgeon's standpoint.  EKG appears stable. AAA 3.99 cm within the past two years. Preoperative labs unremarkable. PLT count 206K. Preoperative SARS CoV1 negative on 05/11/19. Discussed with anesthesiologist [name], MD. If patient asymptomatic from CV/AAA standpoint and otherwise no acute changes then it is anticipated that he can proceed as planned. His assigned anesthesiologist to evaluate on the day of surgery.   VS: BP (!) 144/80   Pulse 70   Temp 36.8 C (Oral)   Resp 16   Ht 5\' 10"  (1.778 m)   Wt 76.2 kg   SpO2 98%   BMI 24.11 kg/m     PROVIDERS: Hoyt Koch, MD is PCP [name], MD is cardiologist [place], Sherren Mocha, MD is vascular surgeon. Last visit 09/08/17. One year follow-up recommended for AAA.  Curt Bears, MD is hematologist. Last visit 06/03/18.  Thrombocytopenia resolved. Continued observation for MGUS recommended. Franchot Gallo, MD is urologist   LABS: Labs reviewed: Acceptable for surgery. (all labs ordered are listed, but only abnormal results are displayed)  Labs Reviewed  SURGICAL PCR SCREEN - Abnormal; Notable for the following components:      Result Value   Staphylococcus aureus POSITIVE (*)    All other components within normal limits  BASIC METABOLIC PANEL - Abnormal; Notable for the following components:   Glucose, Bld 111 (*)    All other components within normal limits  CBC    EKG: 05/11/19: Sinus rhythm with 1st degree A-V block Right bundle branch block Left posterior fascicular block * Bifascicular block * Cannot rule out Inferior infarct , age undetermined Abnormal ECG Confirmed by [name] [phone]) on 05/11/2019 3:04:24 PM - Overall, tracing does not appear significantly changed when compared to 03/12/18 and 08/06/16 EKGs.   CV:  Abdominal aorta duplex 09/08/17: Impression: Patent AAA measuring approximately 3.99 x 3.99 cm in diameter. Aneurysmal right CIA (1.8 x 1.9 cm). No significant change in comparison to the last exam on 07/22/16.  Carotid duplex 10/31/14:  Impressions: Heterogeneous plaque, bilaterally. Stable 1-39% bilateral ICA stenosis Normal subclavian arteries, bilaterally. Patent vertebral arteries with antegrade flow.  Nuclear stress test 10/15/10:  Findings: No evidence of ischemia. EF 55%. Mild septal hypokinesis. Low risk nuclear study.  Echo 10/18/03: SUMMARY - Overall left ventricular systolic function was normal. Left   ventricular ejection fraction was estimated , range being 55   % to 65 %. There were  no left ventricular regional wall   motion abnormalities. Left ventricular wall thickness was   mildly increased. Doppler parameters were consistent with   abnormal left ventricular relaxation. - Aortic valve thickness was mildly increased.  Last  cardiac cath seen was on 04/05/03 prior to CABG.   Past Medical History:  Diagnosis Date  . AAA (abdominal aortic aneurysm) ([place])   . Back pain    10/2010  . Back pain    November, 2013  . Back pain    decompression belt bought off of TV  . CAD (coronary artery disease)   . Carotid artery disease ([place])    Doppler, November, 2013, 0-39% bilateral  . Carotid bruit    November, 2013  . Cataract    bilateral - surgery to remove  . DDD (degenerative disc disease), lumbar    hands  . Dyslipidemia   . Ejection fraction    EF 55%, nuclear 2009  . GERD (gastroesophageal reflux disease)   . History of kidney stones   . HTN (hypertension)   . Hx of CABG    2004  . 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 Hypertension   . MGUS (monoclonal gammopathy of unknown significance)   . Myocardial infarction ([place])   . RBBB (right bundle branch block with left anterior fascicular block)   . Spinal stenosis, lumbar    3-4  . Temporary low platelet count ([place])   . Thrombocytopenia ([place])    Dr. Ralene Ok    Past Surgical History:  Procedure Laterality Date  . BACK SURGERY    . COLONOSCOPY    . COLONOSCOPY WITH PROPOFOL N/A 07/13/2017   Procedure: COLONOSCOPY WITH PROPOFOL;  Surgeon: Manus Gunning, MD;  Location: [facility];  Service: Gastroenterology;  Laterality: N/A;  . CORONARY ARTERY BYPASS GRAFT  2004  . CYSTOSCOPY WITH INSERTION OF UROLIFT N/A 12/14/2018   Procedure: CYSTOSCOPY WITH INSERTION OF UROLIFT;  Surgeon: Franchot Gallo, MD;  Location: [facility];  Service: Urology;  Laterality: N/A;  . DECOMPRESSIVE LUMBAR LAMINECTOMY LEVEL 1 N/A 09/04/2016   Procedure: DECOMPRESSION L3 - L4;  Surgeon: Melina Schools, MD;  Location: [facility];  Service: Orthopedics;  Laterality: N/A;  . EYE SURGERY  2013   bilateral cataract extraction w/ IOL (Dr. Celene Squibb), retina surgery  . JOINT REPLACEMENT    . LUMBAR LAMINECTOMY/DECOMPRESSION MICRODISCECTOMY  09/04/2016    L3-4  . TOTAL HIP ARTHROPLASTY Right 10/2003    MEDICATIONS: . aspirin 81 MG tablet  . docusate sodium (COLACE) 50 MG capsule  . esomeprazole (NEXIUM) 20 MG capsule  . fluticasone (FLONASE) 50 MCG/ACT nasal spray  . lisinopril (ZESTRIL) 20 MG tablet  . metoprolol tartrate (LOPRESSOR) 50 MG tablet  . Multiple Vitamins-Minerals (CENTRUM SILVER ULTRA MENS PO)  . nitroGLYCERIN (NITROSTAT) 0.4 MG SL tablet  . simvastatin (ZOCOR) 40 MG tablet  . traMADol (ULTRAM) 50 MG tablet   No current facility-administered medications for this encounter.     Myra Gianotti, PA-C Surgical Short Stay/Anesthesiology [facility] Phone [phone] [facility] Phone [phone] 05/12/2019 10:57 AM

## 2019-05-12 NOTE — H&P (Signed)
[name] [name] is an 78 y.o. male.   Chief Complaint: back pain HPI: Very pleasant 78 year old gentleman referred by Dr. Nelva Bush for consideration of spinal cord stimulator trial and possible implant.  [name] [name] has a past medical history that includes hypertension, coronary artery disease, degenerative joint disease, and spinal stenosis for which he underwent decompression with Dr. Melina Schools in 2017. He is status post right knee replacement, right hip replacement, a urolift procedure.  He had open heart surgery in May of 2004. He has had no problems with chest pain, shortness of breath, congestive heart failure, since.  He is followed by the [facility] cardiology service.    With regards to back pain, he began developing symptoms of what sounds like neurogenic claudication and 2016. He ultimately underwent L3-4 decompression with Dr. Rolena Infante, but reports that he has centrally has had worsening back pain since a month or so after that surgery.  He now has difficulty standing or walking for more than a few minutes.  He describes deep, dull, achy pain across his left low back and hip, without radiation into the legs when he is up and moving for more than a few minutes.  Quality and character that pain is pressure-like or heavy.  Symptoms only improved when he is lying down.  While up dated imaging reveals some stenosis, apparently, the recommendation was that the patient not proceed with a more involved surgery given his comorbidities.  Instead recommendations were made for spinal cord stimulator therapy.  He saw Dr. Nelva Bush, underwent a psychological evaluation, and a trial was attempted though Dr. Nelva Bush had difficulty position leads.   He was thus referred to [place] for 2nd attempt.  In the surgical center setting,  We were able to position leads adequately, to cover the all the patient's areas.  He came back reporting greater than 50% relief of his painful symptoms.  He was quite anxious for permanent  implant, that we had delay secondary to the coronavirus pandemic.  He now presents for permanent implantation  Past Medical History:  Diagnosis Date  . AAA (abdominal aortic aneurysm) ([place])   . Back pain    10/2010  . Back pain    November, 2013  . Back pain    decompression belt bought off of TV  . CAD (coronary artery disease)   . Carotid artery disease ([place])    Doppler, November, 2013, 0-39% bilateral  . Carotid bruit    November, 2013  . Cataract    bilateral - surgery to remove  . DDD (degenerative disc disease), lumbar    hands  . Dyslipidemia   . Ejection fraction    EF 55%, nuclear 2009  . GERD (gastroesophageal reflux disease)   . History of kidney stones   . HTN (hypertension)   . Hx of CABG    2004  . 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 Hypertension   . MGUS (monoclonal gammopathy of unknown significance)   . Myocardial infarction ([place])   . RBBB (right bundle branch block with left anterior fascicular block)   . Spinal stenosis, lumbar    3-4  . Temporary low platelet count ([place])   . Thrombocytopenia ([place])    Dr. Ralene Ok    Past Surgical History:  Procedure Laterality Date  . BACK SURGERY    . COLONOSCOPY    . COLONOSCOPY WITH PROPOFOL N/A 07/13/2017   Procedure: COLONOSCOPY WITH PROPOFOL;  Surgeon: Havery Moros,  Renelda Loma, MD;  Location: [facility];  Service: Gastroenterology;  Laterality: N/A;  . CORONARY ARTERY BYPASS GRAFT  2004  . CYSTOSCOPY WITH INSERTION OF UROLIFT N/A 12/14/2018   Procedure: CYSTOSCOPY WITH INSERTION OF UROLIFT;  Surgeon: Franchot Gallo, MD;  Location: [facility];  Service: Urology;  Laterality: N/A;  . DECOMPRESSIVE LUMBAR LAMINECTOMY LEVEL 1 N/A 09/04/2016   Procedure: DECOMPRESSION L3 - L4;  Surgeon: Melina Schools, MD;  Location: [place];  Service: Orthopedics;  Laterality: N/A;  . EYE SURGERY  2013   bilateral cataract extraction w/ IOL (Dr. Celene Squibb), retina surgery  . JOINT REPLACEMENT    .  LUMBAR LAMINECTOMY/DECOMPRESSION MICRODISCECTOMY  09/04/2016   L3-4  . TOTAL HIP ARTHROPLASTY Right 10/2003    Family History  Problem Relation Age of Onset  . Kidney disease Mother   . COPD Mother   . Stroke Father        cerebral hemorrhage  . Heart disease Brother   . Heart disease Brother   . Arthritis Brother   . Cancer Paternal Grandfather    Social History:  reports that he has been smoking cigarettes. He has a 32.50 pack-year smoking history. He has never used smokeless tobacco. He reports previous alcohol use. He reports that he does not use drugs.  Allergies:  Allergies  Allergen Reactions  . Penicillins Other (See Comments)    PATIENT HAS HAD A PCN REACTION WITH IMMEDIATE RASH, FACIAL/TONGUE/THROAT SWELLING, SOB, OR LIGHTHEADEDNESS WITH HYPOTENSION:   #  YES  #    Has patient had a PCN reaction causing severe rash involving mucus membranes or skin necrosis: No Has patient had a PCN reaction that required hospitalization No Has patient had a PCN reaction occurring within the last 10 years: No If all of the above answers are "NO", then may proceed with Cephalosporin use.  Passed out    Medications Prior to Admission  Medication Sig Dispense Refill  . docusate sodium (COLACE) 50 MG capsule Take 150 mg by mouth at bedtime.    Marland Kitchen esomeprazole (NEXIUM) 20 MG capsule Take 20 mg by mouth daily at 12 noon.    Marland Kitchen lisinopril (ZESTRIL) 20 MG tablet Take 1 tablet (20 mg total) by mouth daily. 90 tablet 3  . metoprolol tartrate (LOPRESSOR) 50 MG tablet Take 1 tablet (50 mg total) by mouth 2 (two) times daily. Please make yearly appt with Dr. Meda Coffee for April for future refills. 1st attempt 180 tablet 0  . Multiple Vitamins-Minerals (CENTRUM SILVER ULTRA MENS PO) Take 1 tablet by mouth daily.     . simvastatin (ZOCOR) 40 MG tablet Take 1 tablet (40 mg total) by mouth at bedtime. Please make yearly appt with Dr. Meda Coffee for April for future refills. 1st attempt 90 tablet 0  . traMADol  (ULTRAM) 50 MG tablet Take 100 mg by mouth every 8 (eight) hours as needed for moderate pain.     Marland Kitchen aspirin 81 MG tablet Take 1 tablet (81 mg total) by mouth daily. 90 tablet 3  . fluticasone (FLONASE) 50 MCG/ACT nasal spray PLACE 2 SPRAYS INTO EACH NOSTRIL EVERY DAY 16 g 2  . nitroGLYCERIN (NITROSTAT) 0.4 MG SL tablet Place 1 tablet (0.4 mg total) under the tongue every 5 (five) minutes as needed for chest pain. 25 tablet 3    Results for orders placed or performed during the hospital encounter of 05/11/19 (from the past 48 hour(s))  SARS Coronavirus 2 (CEPHEID - Performed in [facility] hospital lab), [facility]  Status: None   Collection Time: 05/11/19  1:03 PM  Result Value Ref Range   SARS Coronavirus 2 NEGATIVE NEGATIVE    Comment: (NOTE) If result is NEGATIVE SARS-CoV-2 target nucleic acids are NOT DETECTED. The SARS-CoV-2 RNA is generally detectable in upper and lower  respiratory specimens during the acute phase of infection. The lowest  concentration of SARS-CoV-2 viral copies this assay can detect is 250  copies / mL. A negative result does not preclude SARS-CoV-2 infection  and should not be used as the sole basis for treatment or other  patient management decisions.  A negative result may occur with  improper specimen collection / handling, submission of specimen other  than nasopharyngeal swab, presence of viral mutation(s) within the  areas targeted by this assay, and inadequate number of viral copies  (<250 copies / mL). A negative result must be combined with clinical  observations, patient history, and epidemiological information. If result is POSITIVE SARS-CoV-2 target nucleic acids are DETECTED. The SARS-CoV-2 RNA is generally detectable in upper and lower  respiratory specimens dur ing the acute phase of infection.  Positive  results are indicative of active infection with SARS-CoV-2.  Clinical  correlation with patient history and other diagnostic information  is  necessary to determine patient infection status.  Positive results do  not rule out bacterial infection or co-infection with other viruses. If result is PRESUMPTIVE POSTIVE SARS-CoV-2 nucleic acids MAY BE PRESENT.   A presumptive positive result was obtained on the submitted specimen  and confirmed on repeat testing.  While 2019 novel coronavirus  (SARS-CoV-2) nucleic acids may be present in the submitted sample  additional confirmatory testing may be necessary for epidemiological  and / or clinical management purposes  to differentiate between  SARS-CoV-2 and other Sarbecovirus currently known to infect humans.  If clinically indicated additional testing with an alternate test  methodology [phone]) is advised. The SARS-CoV-2 RNA is generally  detectable in upper and lower respiratory sp ecimens during the acute  phase of infection. The expected result is Negative. Fact Sheet for Patients:  StrictlyIdeas.no Fact Sheet for Healthcare Providers: BankingDealers.co.za This test is not yet approved or cleared by the [place] FDA and has been authorized for detection and/or diagnosis of SARS-CoV-2 by FDA under an Emergency Use Authorization (EUA).  This EUA will remain in effect (meaning this test can be used) for the duration of the COVID-19 declaration under Section 564(b)(1) of the Act, 21 U.S.C. section 360bbb-3(b)(1), unless the authorization is terminated or revoked sooner. Performed at [facility], [place] [address] [place], [place] [address]    No results found.  Review of Systems  Constitutional: Negative.   HENT: Negative.   Eyes: Negative.   Respiratory: Negative.   Cardiovascular: Negative.   Gastrointestinal: Negative.   Genitourinary: Negative.   Musculoskeletal: Negative.   Skin: Negative.   Neurological: Negative.   Endo/Heme/Allergies: Negative.   Psychiatric/Behavioral: Negative.      Blood pressure (!) 163/84, pulse (!) 58, temperature 98.4 F (36.9 C), temperature source Oral, resp. rate 16, height 5\' 10"  (1.778 m), weight 76.2 kg, SpO2 100 %. Physical Exam  Constitutional: He is oriented to person, place, and time. He appears well-developed and well-nourished.  HENT:  Head: Normocephalic and atraumatic.  Eyes: Pupils are equal, round, and reactive to light. EOM are normal.  Neck: Normal range of motion.  Cardiovascular: Normal rate and regular rhythm.  Respiratory: Effort normal.  GI: Soft.  Musculoskeletal: Normal range of motion.  Neurological: He is alert and oriented to person, place, and time.  Skin: Skin is warm and dry.  Psychiatric: He has a normal mood and affect. Judgment and thought content normal.     Assessment/Plan  1. Lumbar post-laminectomy syndrome 2. Chronic back pain Plan: Permanent placement spinal cord stimulator, [place], MD 05/13/2019, 7:29 AM

## 2019-05-13 ENCOUNTER — Ambulatory Visit (HOSPITAL_COMMUNITY): Payer: PPO | Admitting: Vascular Surgery

## 2019-05-13 ENCOUNTER — Ambulatory Visit (HOSPITAL_COMMUNITY): Payer: PPO

## 2019-05-13 ENCOUNTER — Encounter (HOSPITAL_COMMUNITY): Payer: Self-pay | Admitting: Certified Registered Nurse Anesthetist

## 2019-05-13 ENCOUNTER — Encounter (HOSPITAL_COMMUNITY)
Admission: RE | Disposition: A | Discharge status: Home / Self Care | Payer: Self-pay | Source: Home / Self Care | Attending: Anesthesiology

## 2019-05-13 ENCOUNTER — Ambulatory Visit (HOSPITAL_COMMUNITY)
Admission: RE | Admit: 2019-05-13 | Discharge: 2019-05-13 | Disposition: A | Discharge status: Home / Self Care | Payer: PPO | Attending: Anesthesiology | Admitting: Anesthesiology

## 2019-05-13 ENCOUNTER — Ambulatory Visit (HOSPITAL_COMMUNITY): Payer: PPO | Admitting: Certified Registered Nurse Anesthetist

## 2019-05-13 ENCOUNTER — Other Ambulatory Visit: Payer: Self-pay

## 2019-05-13 DIAGNOSIS — M961 Postlaminectomy syndrome, not elsewhere classified: Secondary | ICD-10-CM

## 2019-05-13 DIAGNOSIS — I2510 Atherosclerotic heart disease of native coronary artery without angina pectoris: Secondary | ICD-10-CM | POA: Insufficient documentation

## 2019-05-13 DIAGNOSIS — E785 Hyperlipidemia, unspecified: Secondary | ICD-10-CM | POA: Insufficient documentation

## 2019-05-13 DIAGNOSIS — G894 Chronic pain syndrome: Secondary | ICD-10-CM | POA: Insufficient documentation

## 2019-05-13 DIAGNOSIS — Z79899 Other long term (current) drug therapy: Secondary | ICD-10-CM | POA: Insufficient documentation

## 2019-05-13 DIAGNOSIS — D696 Thrombocytopenia, unspecified: Secondary | ICD-10-CM | POA: Insufficient documentation

## 2019-05-13 DIAGNOSIS — F17210 Nicotine dependence, cigarettes, uncomplicated: Secondary | ICD-10-CM | POA: Diagnosis not present

## 2019-05-13 DIAGNOSIS — M48061 Spinal stenosis, lumbar region without neurogenic claudication: Secondary | ICD-10-CM | POA: Diagnosis not present

## 2019-05-13 DIAGNOSIS — I252 Old myocardial infarction: Secondary | ICD-10-CM | POA: Insufficient documentation

## 2019-05-13 DIAGNOSIS — Z96651 Presence of right artificial knee joint: Secondary | ICD-10-CM | POA: Diagnosis not present

## 2019-05-13 DIAGNOSIS — Z1159 Encounter for screening for other viral diseases: Secondary | ICD-10-CM | POA: Diagnosis not present

## 2019-05-13 DIAGNOSIS — I10 Essential (primary) hypertension: Secondary | ICD-10-CM | POA: Diagnosis not present

## 2019-05-13 DIAGNOSIS — Z951 Presence of aortocoronary bypass graft: Secondary | ICD-10-CM | POA: Diagnosis not present

## 2019-05-13 DIAGNOSIS — Z7982 Long term (current) use of aspirin: Secondary | ICD-10-CM | POA: Insufficient documentation

## 2019-05-13 DIAGNOSIS — Z96641 Presence of right artificial hip joint: Secondary | ICD-10-CM | POA: Diagnosis not present

## 2019-05-13 DIAGNOSIS — I739 Peripheral vascular disease, unspecified: Secondary | ICD-10-CM | POA: Insufficient documentation

## 2019-05-13 DIAGNOSIS — M5136 Other intervertebral disc degeneration, lumbar region: Secondary | ICD-10-CM | POA: Insufficient documentation

## 2019-05-13 DIAGNOSIS — K219 Gastro-esophageal reflux disease without esophagitis: Secondary | ICD-10-CM | POA: Insufficient documentation

## 2019-05-13 DIAGNOSIS — I714 Abdominal aortic aneurysm, without rupture: Secondary | ICD-10-CM | POA: Insufficient documentation

## 2019-05-13 DIAGNOSIS — Z9682 Presence of neurostimulator: Secondary | ICD-10-CM | POA: Diagnosis not present

## 2019-05-13 DIAGNOSIS — M1990 Unspecified osteoarthritis, unspecified site: Secondary | ICD-10-CM | POA: Insufficient documentation

## 2019-05-13 HISTORY — PX: SPINAL CORD STIMULATOR INSERTION: SHX5378

## 2019-05-13 LAB — APTT: aPTT: 29 seconds (ref 24–36)

## 2019-05-13 SURGERY — INSERTION, SPINAL CORD STIMULATOR, LUMBAR
Anesthesia: General

## 2019-05-13 MED ORDER — ONDANSETRON HCL 4 MG/2ML IJ SOLN
INTRAMUSCULAR | Status: DC | PRN
  Administered 2019-05-13: 4 mg via INTRAVENOUS

## 2019-05-13 MED ORDER — PHENYLEPHRINE 40 MCG/ML (10ML) SYRINGE FOR IV PUSH (FOR BLOOD PRESSURE SUPPORT)
PREFILLED_SYRINGE | INTRAVENOUS | Status: AC
  Filled 2019-05-13: qty 10

## 2019-05-13 MED ORDER — ROCURONIUM BROMIDE 50 MG/5ML IV SOSY
PREFILLED_SYRINGE | INTRAVENOUS | Status: DC | PRN
  Administered 2019-05-13: 60 mg via INTRAVENOUS

## 2019-05-13 MED ORDER — BUPIVACAINE-EPINEPHRINE (PF) 0.5% -1:200000 IJ SOLN
INTRAMUSCULAR | Status: DC | PRN
  Administered 2019-05-13 (×2): 22 mL

## 2019-05-13 MED ORDER — BUPIVACAINE-EPINEPHRINE (PF) 0.5% -1:200000 IJ SOLN
INTRAMUSCULAR | Status: AC
  Filled 2019-05-13: qty 60

## 2019-05-13 MED ORDER — ONDANSETRON HCL 4 MG/2ML IJ SOLN
INTRAMUSCULAR | Status: AC
  Filled 2019-05-13: qty 2

## 2019-05-13 MED ORDER — PROPOFOL 10 MG/ML IV BOLUS
INTRAVENOUS | Status: AC
  Filled 2019-05-13: qty 20

## 2019-05-13 MED ORDER — FENTANYL CITRATE (PF) 250 MCG/5ML IJ SOLN
INTRAMUSCULAR | Status: AC
  Filled 2019-05-13: qty 5

## 2019-05-13 MED ORDER — LIDOCAINE 2% (20 MG/ML) 5 ML SYRINGE
INTRAMUSCULAR | Status: DC | PRN
  Administered 2019-05-13: 60 mg via INTRAVENOUS

## 2019-05-13 MED ORDER — VANCOMYCIN HCL IN DEXTROSE 1-5 GM/200ML-% IV SOLN
1000.0000 mg | INTRAVENOUS | Status: AC
  Administered 2019-05-13: 1000 mg via INTRAVENOUS
  Filled 2019-05-13: qty 200

## 2019-05-13 MED ORDER — DEXAMETHASONE SODIUM PHOSPHATE 10 MG/ML IJ SOLN
INTRAMUSCULAR | Status: AC
  Filled 2019-05-13: qty 1

## 2019-05-13 MED ORDER — DEXAMETHASONE SODIUM PHOSPHATE 10 MG/ML IJ SOLN
INTRAMUSCULAR | Status: DC | PRN
  Administered 2019-05-13: 4 mg via INTRAVENOUS

## 2019-05-13 MED ORDER — PROPOFOL 10 MG/ML IV BOLUS
INTRAVENOUS | Status: DC | PRN
  Administered 2019-05-13: 70 mg via INTRAVENOUS

## 2019-05-13 MED ORDER — CLINDAMYCIN HCL 150 MG PO CAPS: 150.0000 mg | ORAL_CAPSULE | Freq: Three times a day (TID) | ORAL | 0 refills | Status: AC

## 2019-05-13 MED ORDER — 0.9 % SODIUM CHLORIDE (POUR BTL) OPTIME
TOPICAL | Status: DC | PRN
  Administered 2019-05-13: 1000 mL

## 2019-05-13 MED ORDER — FENTANYL CITRATE (PF) 100 MCG/2ML IJ SOLN: 25.0000 ug | INTRAMUSCULAR | Status: DC | PRN

## 2019-05-13 MED ORDER — SODIUM CHLORIDE 0.9 % IV SOLN
INTRAVENOUS | Status: DC | PRN
  Administered 2019-05-13: 08:00:00

## 2019-05-13 MED ORDER — MIDAZOLAM HCL 2 MG/2ML IJ SOLN
INTRAMUSCULAR | Status: AC
  Filled 2019-05-13: qty 2

## 2019-05-13 MED ORDER — PHENYLEPHRINE 40 MCG/ML (10ML) SYRINGE FOR IV PUSH (FOR BLOOD PRESSURE SUPPORT)
PREFILLED_SYRINGE | INTRAVENOUS | Status: DC | PRN
  Administered 2019-05-13 (×2): 80 ug via INTRAVENOUS

## 2019-05-13 MED ORDER — LACTATED RINGERS IV SOLN
INTRAVENOUS | Status: DC | PRN
  Administered 2019-05-13: 07:00:00 via INTRAVENOUS

## 2019-05-13 MED ORDER — BACITRACIN-NEOMYCIN-POLYMYXIN 400-5-5000 EX OINT
TOPICAL_OINTMENT | CUTANEOUS | Status: AC
  Filled 2019-05-13: qty 1

## 2019-05-13 MED ORDER — CHLORHEXIDINE GLUCONATE CLOTH 2 % EX PADS: 6.0000 | MEDICATED_PAD | Freq: Once | CUTANEOUS | Status: DC

## 2019-05-13 MED ORDER — SUGAMMADEX SODIUM 200 MG/2ML IV SOLN
INTRAVENOUS | Status: DC | PRN
  Administered 2019-05-13: 200 mg via INTRAVENOUS

## 2019-05-13 MED ORDER — FENTANYL CITRATE (PF) 100 MCG/2ML IJ SOLN
INTRAMUSCULAR | Status: DC | PRN
  Administered 2019-05-13: 25 ug via INTRAVENOUS
  Administered 2019-05-13 (×2): 50 ug via INTRAVENOUS
  Administered 2019-05-13: 25 ug via INTRAVENOUS

## 2019-05-13 MED ORDER — HYDROCODONE-ACETAMINOPHEN 5-325 MG PO TABS: 1.0000 | ORAL_TABLET | ORAL | 0 refills | Status: AC | PRN

## 2019-05-13 SURGICAL SUPPLY — 68 items
ADH SKN CLS APL DERMABOND .7 (GAUZE/BANDAGES/DRESSINGS) ×1
APL SKNCLS STERI-STRIP NONHPOA (GAUZE/BANDAGES/DRESSINGS)
BAG DECANTER FOR FLEXI CONT (MISCELLANEOUS) ×2 IMPLANT
BENZOIN TINCTURE PRP APPL 2/3 (GAUZE/BANDAGES/DRESSINGS) IMPLANT
BINDER ABD UNIV 10 28-50 (GAUZE/BANDAGES/DRESSINGS) IMPLANT
BINDER ABDOM UNIV 10 (GAUZE/BANDAGES/DRESSINGS) ×2
BINDER ABDOMINAL 12 ML 46-62 (SOFTGOODS) ×2 IMPLANT
BLADE CLIPPER SURG (BLADE) IMPLANT
CHLORAPREP W/TINT 26ML (MISCELLANEOUS) ×2 IMPLANT
CLIP VESOCCLUDE SM WIDE 6/CT (CLIP) ×1 IMPLANT
COVER WAND RF STERILE (DRAPES) ×2 IMPLANT
DERMABOND ADVANCED (GAUZE/BANDAGES/DRESSINGS) ×1
DERMABOND ADVANCED .7 DNX12 (GAUZE/BANDAGES/DRESSINGS) ×1 IMPLANT
DRAPE C-ARM 42X72 X-RAY (DRAPES) ×2 IMPLANT
DRAPE C-ARMOR (DRAPES) ×2 IMPLANT
DRAPE LAPAROTOMY 100X72X124 (DRAPES) ×2 IMPLANT
DRAPE POUCH INSTRU U-SHP 10X18 (DRAPES) ×2 IMPLANT
DRAPE SURG 17X23 STRL (DRAPES) ×2 IMPLANT
DRSG OPSITE POSTOP 3X4 (GAUZE/BANDAGES/DRESSINGS) ×2 IMPLANT
ELECT REM PT RETURN 9FT ADLT (ELECTROSURGICAL) ×2
ELECTRODE REM PT RTRN 9FT ADLT (ELECTROSURGICAL) ×1 IMPLANT
GAUZE 4X4 16PLY RFD (DISPOSABLE) ×2 IMPLANT
GENERATOR NEUROSTIMULATOR (Neurostimulator) ×1 IMPLANT
GLOVE BIOGEL PI IND STRL 7.5 (GLOVE) ×1 IMPLANT
GLOVE BIOGEL PI INDICATOR 7.5 (GLOVE) ×1
GLOVE ECLIPSE 7.5 STRL STRAW (GLOVE) ×2 IMPLANT
GLOVE EXAM NITRILE LRG STRL (GLOVE) IMPLANT
GLOVE EXAM NITRILE XL STR (GLOVE) IMPLANT
GLOVE EXAM NITRILE XS STR PU (GLOVE) IMPLANT
GOWN STRL REUS W/ TWL LRG LVL3 (GOWN DISPOSABLE) IMPLANT
GOWN STRL REUS W/ TWL XL LVL3 (GOWN DISPOSABLE) IMPLANT
GOWN STRL REUS W/TWL 2XL LVL3 (GOWN DISPOSABLE) IMPLANT
GOWN STRL REUS W/TWL LRG LVL3 (GOWN DISPOSABLE)
GOWN STRL REUS W/TWL XL LVL3 (GOWN DISPOSABLE)
KIT BASIN OR (CUSTOM PROCEDURE TRAY) ×2 IMPLANT
KIT CHARGING (KITS) ×1
KIT CHARGING PRECISION NEURO (KITS) IMPLANT
KIT LEAD PERCUTANOUS INFINION (Lead) ×2 IMPLANT
KIT REMOTE CONTROL 112802 FREE (KITS) ×1 IMPLANT
KIT TURNOVER KIT B (KITS) ×2 IMPLANT
NDL 18GX1X1/2 (RX/OR ONLY) (NEEDLE) IMPLANT
NDL ENTRADA 4.5IN (NEEDLE) IMPLANT
NDL HYPO 25X1 1.5 SAFETY (NEEDLE) ×1 IMPLANT
NEEDLE 18GX1X1/2 (RX/OR ONLY) (NEEDLE) IMPLANT
NEEDLE ENTRADA 4.5IN (NEEDLE) ×4 IMPLANT
NEEDLE HYPO 25X1 1.5 SAFETY (NEEDLE) ×2 IMPLANT
NS IRRIG 1000ML POUR BTL (IV SOLUTION) ×2 IMPLANT
PACK LAMINECTOMY NEURO (CUSTOM PROCEDURE TRAY) ×2 IMPLANT
PAD ARMBOARD 7.5X6 YLW CONV (MISCELLANEOUS) ×2 IMPLANT
PENCIL BUTTON HOLSTER BLD 10FT (ELECTRODE) ×1 IMPLANT
SPONGE LAP 4X18 RFD (DISPOSABLE) ×2 IMPLANT
SPONGE SURGIFOAM ABS GEL SZ50 (HEMOSTASIS) IMPLANT
STAPLER SKIN PROX WIDE 3.9 (STAPLE) IMPLANT
STRIP CLOSURE SKIN 1/2X4 (GAUZE/BANDAGES/DRESSINGS) IMPLANT
SUT MNCRL AB 3-0 PS2 18 (SUTURE) ×1 IMPLANT
SUT MNCRL AB 4-0 PS2 18 (SUTURE) IMPLANT
SUT SILK 0 (SUTURE) ×2
SUT SILK 0 MO-6 18XCR BRD 8 (SUTURE) ×1 IMPLANT
SUT SILK 0 TIES 10X30 (SUTURE) IMPLANT
SUT SILK 2 0 TIES 10X30 (SUTURE) IMPLANT
SUT VIC AB 2-0 CP2 18 (SUTURE) ×5 IMPLANT
SYR 10ML LL (SYRINGE) IMPLANT
SYR EPIDURAL 5ML GLASS (SYRINGE) ×2 IMPLANT
TOOL LONG TUNNEL (SPINAL CORD STIMULATOR) ×1 IMPLANT
TOWEL GREEN STERILE (TOWEL DISPOSABLE) ×2 IMPLANT
TOWEL GREEN STERILE FF (TOWEL DISPOSABLE) ×2 IMPLANT
WATER STERILE IRR 1000ML POUR (IV SOLUTION) ×2 IMPLANT
YANKAUER SUCT BULB TIP NO VENT (SUCTIONS) ×2 IMPLANT

## 2019-05-13 NOTE — Transfer of Care (Signed)
Immediate Anesthesia Transfer of Care Note  Patient: [name] [name]  Procedure(s) Performed: LUMBAR SPINAL CORD STIMULATOR INSERTION (N/A )  Patient Location: PACU  Anesthesia Type:General  Level of Consciousness: awake and alert   Airway & Oxygen Therapy: Patient Spontanous Breathing and Patient connected to nasal cannula oxygen  Post-op Assessment: Report given to RN and Post -op Vital signs reviewed and stable  Post vital signs: Reviewed and stable  Last Vitals:  Vitals Value Taken Time  BP 141/107 05/13/2019  9:18 AM  Temp 36.5 C 05/13/2019  9:17 AM  Pulse 62 05/13/2019  9:20 AM  Resp 20 05/13/2019  9:20 AM  SpO2 98 % 05/13/2019  9:20 AM  Vitals shown include unvalidated device data.  Last Pain:  Vitals:   05/13/19 0629  TempSrc:   PainSc: 3          Complications: No apparent anesthesia complications

## 2019-05-13 NOTE — Anesthesia Postprocedure Evaluation (Signed)
Anesthesia Post Note  Patient: [name] [name]  Procedure(s) Performed: LUMBAR SPINAL CORD STIMULATOR INSERTION (N/A )     Patient location during evaluation: PACU Anesthesia Type: General Level of consciousness: awake and alert Pain management: pain level controlled Vital Signs Assessment: post-procedure vital signs reviewed and stable Respiratory status: spontaneous breathing, nonlabored ventilation, respiratory function stable and patient connected to nasal cannula oxygen Cardiovascular status: blood pressure returned to baseline and stable Postop Assessment: no apparent nausea or vomiting Anesthetic complications: no    Last Vitals:  Vitals:   05/13/19 0949 05/13/19 0955  BP: (!) 158/77   Pulse: (!) 58 (!) 58  Resp: 17 17  Temp:  (!) 36.1 C  SpO2: 99% 99%    Last Pain:  Vitals:   05/13/19 0955  TempSrc:   PainSc: 3                  Anajulia Leyendecker L Kieryn Burtis

## 2019-05-13 NOTE — Discharge Instructions (Signed)
Dr. Nadina Fomby Post-Op Orders ° °• Ice Pack - 20 minutes on (in a pillow case), and 20 minutes off. Wear the ice pack UNDER the binder. °• Follow up in office, they will call you for an appointment in 10 days to 2 weeks. °• Increase activity gradually.   °• No lifting anything heavier than a gallon of milk (10 pounds) until seen in the office. °• Advance diet slowly as tolerated. °• Dressing care:  Keep dressing dry for 3 days, and on Post-op day 4, may shower. °• Call for fever, drainage, and redness. °• No swimming or bathing in a bathtub (do not get into standing water). °•  °

## 2019-05-13 NOTE — Anesthesia Procedure Notes (Signed)
Procedure Name: Intubation Date/Time: 05/13/2019 7:40 AM Performed by: Inda Coke, CRNA Pre-anesthesia Checklist: Patient identified, Emergency Drugs available, Suction available and Patient being monitored Patient Re-evaluated:Patient Re-evaluated prior to induction Oxygen Delivery Method: Circle System Utilized Preoxygenation: Pre-oxygenation with 100% oxygen Induction Type: IV induction Ventilation: Mask ventilation without difficulty and Oral airway inserted - appropriate to patient size Laryngoscope Size: Mac and 4 Grade View: Grade I Tube type: Oral Tube size: 7.5 mm Number of attempts: 1 Airway Equipment and Method: Stylet and Oral airway Placement Confirmation: ETT inserted through vocal cords under direct vision,  positive ETCO2 and breath sounds checked- equal and bilateral Secured at: 22 cm Tube secured with: Tape Dental Injury: Teeth and Oropharynx as per pre-operative assessment

## 2019-05-13 NOTE — Op Note (Signed)
PREOP DX: 1) chronic pain syndrome 2)lumbar post-laminectomy syndrome POSTOP KG:MWNU as preop PROCEDURES PERFORMED:1) intraop fluoro 2) placement of 2 16 contact [employer] Infinion leads 3) placement of Spectra SCS generator 4) post op complex SCS programming SURGEON:Lora Glomski  ASSISTANT: NONE  ANESTHESIA:GETA EBL: <20cc  DESCRIPTION OF PROCEDURE: After a discussion of risks, benefits and alternatives, informed consent was obtained. The patient was taken to the OR,general anesthesia induced,turned prone onto a Jackson table, all pressure points padded, SCD's placed. A timeout was taken to verify the correct patient, position, personnel, availability of appropriate equipment, and administration of perioperative antibiotics.  The thoracic and lumbar areas were widely prepped with chloraprep and draped into a sterile field. Fluoroscopy was used to plan aLEFTparamedian incision at the L1-L2 levels, and an incision made with a 10 blade and carried down to the dorsolumbar fascia with the bovie and blunt dissection. Retractors were placed and a 14g [employer] tuohy needle placed into the epidural space at the T12-L1 interspace using biplanar fluoro and loss-of-resistance technique. The needle was aspirated without any return of fluid. A [employer] INFINION lead was introduced and under live AP fluoro advanced until the distal-most2contactsoverlay thesuperioraspectof theT7vertebral body shadow with the rest of the contacts distributed over [employer] bodies in a position just left of anatomic midline. A second Infinion lead was placed just right of anatomic midline at the same levelsusing the same technique. EXTREME CARE WAS TAKEN TO FOLLOW THE SCOLIOTIC CURVATURE SO THAT THE LEADS WERE AT ANATOMIC MIDLINE.  0 silk sutures were placed in the fascia adjacent to the needles. The needles and stylets were removed under fluoroscopy with no lead migration noted.  Leads were then fixed to the fascia bychest tube type fixationwith the sutures; hemaclips were placed at the fascial emersion site of the leads as radiomarkers. Repeat images were obtained to verify that there had been no lead migration. The incision was inspected and hemostasis obtained with the bipolar cautery.    Attention was then turned to creation of a subcutaneous pocket. At theleftflank, a 3 cm incision was made with a 10 blade and using the bovie and blunt dissection a pocket of size appropriate to place a SCS generator. The pocket was trialed, and found to be of adequate size. The pocket was inspected for hemostasis, which was found to be excellent. Using reverse seldinger technique, the leads were tunneled to the pocket site, and the leads inserted into the SCS generator. Impedances were checked, and all found to be excellent. The leads were then all fixed into position with a self-torquing wrench. The wiring was all carefully coiled, placed behind the generator and placed in the pocket.  Both incisions were copiously irrigated with bacitracin-containing irrigation. The lumbar incision was closed in2  layers of interrupted 2-0 vicryl and the skin closed with 3-0 Monocryl subcuticular suture and Dermabond.The pocket incision was closed with a deeper layer of 2-0 vicryl interrupted sutures, and the skin closed with 3-0 Monocryl subcuticular suture and Dermabond.Sterile dressings were applied.   Needle, sponge, and instrument counts were correct x2 at the end of the case.    The patient was then carefully awakened from anesthesia, turned supine, an abdominal binder placed, and the patient taken to the recovery room where he underwent complex spinal cord stimulator programming.  COMPLICATIONS: NONE  CONDITION: Stable throughout the course of the procedure and immediately afterward  DISPOSITION:anticipatedischarge to home, with antibiotics and pain medicine. Discussed care  with the patientandfamily. Followup in clinic will  be scheduled in 10-14 days.

## 2019-05-16 ENCOUNTER — Encounter (HOSPITAL_COMMUNITY): Payer: Self-pay | Admitting: Anesthesiology

## 2019-05-16 NOTE — Progress Notes (Signed)
Subjective:   [name] [name] is a 78 y.o. male who presents for Medicare Annual/Subsequent preventive examination. I connected with patient by a telephone and verified that I am speaking with the correct person using two identifiers. Patient stated full name and DOB. Patient gave permission to continue with telephonic visit. Patient's location was at home and Nurse's location was at [place] office.   Review of Systems:  No ROS.  Medicare Wellness Virtual Visit.  Visual/audio telehealth visit, UTA vital signs.   See social history for additional risk factors.   Sleep patterns: gets up 1-3 times nightly to void and sleeps 5-7 hours nightly. Sleep pattern varies according to pain level. Patient states overall he does well with sleep and he does take day-time naps if needed.     Home Safety/Smoke Alarms: Feels safe in home. Smoke alarms in place.  Living environment; residence and Firearm Safety: 1-story house/ trailer. Lives with wife, no needs for DME, good support system Seat Belt Safety/Bike Helmet: Wears seat belt.     Objective:    Vitals: There were no vitals taken for this visit.  There is no height or weight on file to calculate BMI.  Advanced Directives 05/11/2019 12/14/2018 12/09/2018 05/14/2018 07/13/2017 06/29/2017 05/14/2017  Does Patient Have a Medical Advance Directive? Yes No No Yes Yes Yes Yes  Type of Paramedic of [place];Living will - - [place];Living will [place];Living will [place];Living will [place];Living will  Does patient want to make changes to medical advance directive? No - Patient declined - - - - - -  Copy of [place] in Chart? No - copy requested - - - No - copy requested No - copy requested -  Would patient like information on creating a medical advance directive? - No - Patient declined No - Patient declined - - - -    Tobacco Social  History   Tobacco Use  Smoking Status Heavy Tobacco Smoker  . Packs/day: 0.50  . Years: 65.00  . Pack years: 32.50  . Types: Cigarettes  Smokeless Tobacco Never Used  Tobacco Comment   smoking since age 10 yrs old     Ready to quit: No Counseling given: No Comment: smoking since age 54 yrs old  Past Medical History:  Diagnosis Date  . AAA (abdominal aortic aneurysm) ([place])   . Back pain    10/2010  . Back pain    November, 2013  . Back pain    decompression belt bought off of TV  . CAD (coronary artery disease)   . Carotid artery disease ([place])    Doppler, November, 2013, 0-39% bilateral  . Carotid bruit    November, 2013  . Cataract    bilateral - surgery to remove  . DDD (degenerative disc disease), lumbar    hands  . Dyslipidemia   . Ejection fraction    EF 55%, nuclear 2009  . GERD (gastroesophageal reflux disease)   . History of kidney stones   . HTN (hypertension)   . Hx of CABG    2004  . 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 Hypertension   . MGUS (monoclonal gammopathy of unknown significance)   . Myocardial infarction ([place])   . RBBB (right bundle branch block with left anterior fascicular block)   . Spinal stenosis, lumbar    3-4  . Temporary low  platelet count ([place])   . Thrombocytopenia ([place])    Dr. Ralene Ok   Past Surgical History:  Procedure Laterality Date  . BACK SURGERY    . COLONOSCOPY    . COLONOSCOPY WITH PROPOFOL N/A 07/13/2017   Procedure: COLONOSCOPY WITH PROPOFOL;  Surgeon: Manus Gunning, MD;  Location: [facility];  Service: Gastroenterology;  Laterality: N/A;  . CORONARY ARTERY BYPASS GRAFT  2004  . CYSTOSCOPY WITH INSERTION OF UROLIFT N/A 12/14/2018   Procedure: CYSTOSCOPY WITH INSERTION OF UROLIFT;  Surgeon: Franchot Gallo, MD;  Location: [facility];  Service: Urology;  Laterality: N/A;  . DECOMPRESSIVE LUMBAR LAMINECTOMY LEVEL 1 N/A 09/04/2016   Procedure: DECOMPRESSION L3 - L4;  Surgeon:  Melina Schools, MD;  Location: [facility];  Service: Orthopedics;  Laterality: N/A;  . EYE SURGERY  2013   bilateral cataract extraction w/ IOL (Dr. Celene Squibb), retina surgery  . JOINT REPLACEMENT    . LUMBAR LAMINECTOMY/DECOMPRESSION MICRODISCECTOMY  09/04/2016   L3-4  . SPINAL CORD STIMULATOR INSERTION N/A 05/13/2019   Procedure: LUMBAR SPINAL CORD STIMULATOR INSERTION;  Surgeon: Clydell Hakim, MD;  Location: [facility];  Service: Neurosurgery;  Laterality: N/A;  LUMBAR SPINAL CORD STIMULATOR INSERTION  . TOTAL HIP ARTHROPLASTY Right 10/2003   Family History  Problem Relation Age of Onset  . Kidney disease Mother   . COPD Mother   . Stroke Father        cerebral hemorrhage  . Heart disease Brother   . Heart disease Brother   . Arthritis Brother   . Cancer Paternal Grandfather    Social History   Socioeconomic History  . Marital status: Married    Spouse name: Not on file  . Number of children: 2  . Years of education: 52  . Highest education level: Not on file  Occupational History  . Occupation: trucking     Fish farm manager: RETIRED  Social Needs  . Financial resource strain: Not hard at all  . Food insecurity:    Worry: Never true    Inability: Never true  . Transportation needs:    Medical: No    Non-medical: No  Tobacco Use  . Smoking status: Heavy Tobacco Smoker    Packs/day: 0.50    Years: 65.00    Pack years: 32.50    Types: Cigarettes  . Smokeless tobacco: Never Used  . Tobacco comment: smoking since age 40 yrs old  Substance and Sexual Activity  . Alcohol use: Not Currently    Comment: occasional beer  . Drug use: No  . Sexual activity: Not Currently  Lifestyle  . Physical activity:    Days per week: 0 days    Minutes per session: 0 min  . Stress: Not at all  Relationships  . Social connections:    Talks on phone: More than three times a week    Gets together: More than three times a week    Attends religious service: More than 4 times per year    Active member of  club or organization: Yes    Attends meetings of clubs or organizations: More than 4 times per year    Relationship status: Married  Other Topics Concern  . Not on file  Social History Narrative   HSG, Married '62, 1 son-'70, 1 dtr - '68; 2 grandchildren. Work - Probation officer.   ACP - DNR, DNI, no artificial hydration or feeding, no heroic measures    Outpatient Encounter Medications as of 05/17/2019  Medication Sig  . aspirin 81  MG tablet Take 1 tablet (81 mg total) by mouth daily.  . clindamycin (CLEOCIN) 150 MG capsule Take 1 capsule (150 mg total) by mouth 3 (three) times daily for 10 doses.  Marland Kitchen docusate sodium (COLACE) 50 MG capsule Take 150 mg by mouth at bedtime.  Marland Kitchen esomeprazole (NEXIUM) 20 MG capsule Take 20 mg by mouth daily at 12 noon.  . fluticasone (FLONASE) 50 MCG/ACT nasal spray PLACE 2 SPRAYS INTO EACH NOSTRIL EVERY DAY  . HYDROcodone-acetaminophen (NORCO/VICODIN) 5-325 MG tablet Take 1-2 tablets by mouth every 4 (four) hours as needed for up to 7 days for moderate pain.  Marland Kitchen lisinopril (ZESTRIL) 20 MG tablet Take 1 tablet (20 mg total) by mouth daily.  . metoprolol tartrate (LOPRESSOR) 50 MG tablet Take 1 tablet (50 mg total) by mouth 2 (two) times daily. Please make yearly appt with Dr. Meda Coffee for April for future refills. 1st attempt  . Multiple Vitamins-Minerals (CENTRUM SILVER ULTRA MENS PO) Take 1 tablet by mouth daily.   . nitroGLYCERIN (NITROSTAT) 0.4 MG SL tablet Place 1 tablet (0.4 mg total) under the tongue every 5 (five) minutes as needed for chest pain.  . simvastatin (ZOCOR) 40 MG tablet Take 1 tablet (40 mg total) by mouth at bedtime. Please make yearly appt with Dr. Meda Coffee for April for future refills. 1st attempt  . traMADol (ULTRAM) 50 MG tablet Take 100 mg by mouth every 8 (eight) hours as needed for moderate pain.    No facility-administered encounter medications on file as of 05/17/2019.     Activities of Daily Living In your present state of  health, do you have any difficulty performing the following activities: 05/17/2019 05/13/2019  Hearing? N -  Vision? N -  Difficulty concentrating or making decisions? N -  Walking or climbing stairs? N -  Comment - -  Dressing or bathing? N -  Doing errands, shopping? N N  Preparing Food and eating ? N -  Using the Toilet? N -  In the past six months, have you accidently leaked urine? N -  Do you have problems with loss of bowel control? N -  Managing your Medications? N -  Managing your Finances? N -  Housekeeping or managing your Housekeeping? N -  Some recent data might be hidden    Patient Care Team: Hoyt Koch, MD as PCP - General (Internal Medicine) Dorothy Spark, MD as PCP - Cardiology (Cardiology) Suella Broad, MD (Physical Medicine and Rehabilitation) Franchot Gallo, MD as Consulting Physician (Urology) Luberta Mutter, MD as Consulting Physician (Ophthalmology)   Assessment:   This is a routine wellness examination for [facility]. Physical assessment deferred to PCP.  Exercise Activities and Dietary recommendations Current Exercise Habits: The patient does not participate in regular exercise at present, Time (Minutes): 30, Intensity: Mild, Exercise limited by: orthopedic condition(s) Diet (meal preparation, eat out, water intake, caffeinated beverages, dairy products, fruits and vegetables): in general, a "healthy" diet  , well balanced   Reviewed heart healthy diet. Encouraged patient to increase daily water and healthy fluid intake.  Goals    . Maintain current health status     Continue to exercise, be as active as possible, show my tractors, be the best family man I can be.       Fall Risk Fall Risk  05/17/2019 05/14/2018 08/31/2017 05/14/2017 01/31/2016  Falls in the past year? 0 No Yes No No  Number falls in past yr: 0 - 1 - -  Injury with Fall? - -  No - -  Risk for fall due to : - Impaired balance/gait;Impaired mobility - Impaired  balance/gait;Impaired mobility -  Risk for fall due to: Comment - - - - -    Depression Screen PHQ 2/9 Scores 05/17/2019 05/14/2018 08/31/2017 05/14/2017  PHQ - 2 Score 0 1 0 1  PHQ- 9 Score - 3 - -    Cognitive Function MMSE - Mini Mental State Exam 05/14/2018  Orientation to time 5  Orientation to Place 5  Registration 3  Attention/ Calculation 5  Recall 2  Language- name 2 objects 2  Language- repeat 1  Language- follow 3 step command 3  Language- read & follow direction 1  Write a sentence 1  Copy design 1  Total score 29       Ad8 score reviewed for issues:  Issues making decisions: no  Less interest in hobbies / activities: no  Repeats questions, stories (family complaining): no  Trouble using ordinary gadgets (microwave, computer, phone):no  Forgets the month or year: no  Mismanaging finances: no  Remembering appts: no  Daily problems with thinking and/or memory: no Ad8 score is= 0  Immunization History  Administered Date(s) Administered  . Influenza Split 11/11/2011  . Influenza, High Dose Seasonal PF 11/05/2016, 11/19/2017  . Influenza, Seasonal, Injecte, Preservative Fre 12/06/2012  . Influenza,inj,Quad PF,6+ Mos 01/19/2015, 01/31/2016  . Pneumococcal Conjugate-13 07/31/2015  . Pneumococcal Polysaccharide-23 12/06/2012  . Tdap 11/11/2011   Screening Tests Health Maintenance  Topic Date Due  . INFLUENZA VACCINE  07/09/2019  . TETANUS/TDAP  11/10/2021  . PNA vac Low Risk Adult  Completed      Plan:    Reviewed health maintenance screenings with patient today and relevant education, vaccines, and/or referrals were provided.   Continue to eat heart healthy diet (full of fruits, vegetables, whole grains, lean protein, water--limit salt, fat, and sugar intake) and increase physical activity as tolerated.  Continue doing brain stimulating activities (puzzles, reading, adult coloring books, staying active) to keep memory sharp.   I have personally  reviewed and noted the following in the patient's chart:   . Medical and social history . Use of alcohol, tobacco or illicit drugs  . Current medications and supplements . Functional ability and status . Nutritional status . Physical activity . Advanced directives . List of other physicians . Screenings to include cognitive, depression, and falls . Referrals and appointments  In addition, I have reviewed and discussed with patient certain preventive protocols, quality metrics, and best practice recommendations. A written personalized care plan for preventive services as well as general preventive health recommendations were provided to patient.     Michiel Cowboy, RN  05/17/2019

## 2019-05-17 ENCOUNTER — Ambulatory Visit (INDEPENDENT_AMBULATORY_CARE_PROVIDER_SITE_OTHER): Payer: PPO | Admitting: *Deleted

## 2019-05-17 DIAGNOSIS — Z0000 Encounter for general adult medical examination without abnormal findings: Secondary | ICD-10-CM | POA: Diagnosis not present

## 2019-05-17 NOTE — Progress Notes (Signed)
Medical screening examination/treatment/procedure(s) were performed by non-physician practitioner and as supervising physician I was immediately available for consultation/collaboration. I agree with above. Jahmai Finelli A Susa Bones, MD 

## 2019-05-25 ENCOUNTER — Telehealth: Payer: Self-pay | Admitting: *Deleted

## 2019-05-25 NOTE — Telephone Encounter (Signed)
Called and switched pts virtual appt 6/26/ to in-office.  Pt answered no to the following prescreening questions.        COVID-19 Pre-Screening Questions:  . In the past 7 to 10 days have you had a cough,  shortness of breath, headache, congestion, fever (100 or greater) body aches, chills, sore throat, or sudden loss of taste or sense of smell? . Have you been around anyone with known Covid 19. . Have you been around anyone who is awaiting Covid 19 test results in the past 7 to 10 days? . Have you been around anyone who has been exposed to Covid 19, or has mentioned symptoms of Covid 19 within the past 7 to 10 days?  If you have any concerns/questions about symptoms patients report during screening (either on the phone or at threshold). Contact the provider seeing the patient or DOD for further guidance.  If neither are available contact a member of the leadership team.

## 2019-05-26 DIAGNOSIS — Z9689 Presence of other specified functional implants: Secondary | ICD-10-CM | POA: Diagnosis not present

## 2019-05-26 DIAGNOSIS — I10 Essential (primary) hypertension: Secondary | ICD-10-CM | POA: Diagnosis not present

## 2019-05-26 DIAGNOSIS — M961 Postlaminectomy syndrome, not elsewhere classified: Secondary | ICD-10-CM | POA: Diagnosis not present

## 2019-06-02 ENCOUNTER — Encounter: Payer: Self-pay | Admitting: Physician Assistant

## 2019-06-02 ENCOUNTER — Telehealth: Payer: Self-pay | Admitting: Physician Assistant

## 2019-06-02 NOTE — Telephone Encounter (Signed)
New Message     COVID SCREENING QUESTIONS FOR IN-OFFICE VISITS - PLEASE DOCUMENT PATIENT ANSWERS  1. Do you currently have a fever?  a. NO  2. Have you recently traveled on a cruise, internationally, or to [place], [place], [place], [place], [place], or [place], [place] [employer])?  a. NO  3. Have you been in contact with someone that is currently pending confirmation of Covid19 testing or has been confirmed to have the [place] virus?  a. NO  4. Are you currently experiencing fatigue or cough?  a. NO

## 2019-06-02 NOTE — Progress Notes (Signed)
Cardiology Office Note    Date:  06/03/2019  ID:  [name] [name], DOB [birth year], MRN [id number] PCP:  Hoyt Koch, MD  Cardiologist:  [name], MD   Chief Complaint: f/u BP  History of Present Illness:  [name] [name] is a 78 y.o. male with history of CAD s/p CABGx5V (2004), small AAA followed by Dr. Donnetta Hutching, carotid artery disease, tobacco abuse, thrombocytopenia/MGUS, HTN, HLD, RBBB/LPFB, hyperglycemia who presents for f/u of elevated blood pressure.  His last AAA duplex was in 2018, 3.9x3.9cm stable AAA and aneurysmal right common iliac artery. Carotid duplex in 10/2014 showed 1-39% stenosis, f/u recommended 2 years. He has not been back to see Dr. Donnetta Hutching since 2018. Last echocardiogram in 2004 showed EF 55-60%, mild LVH, abnormal relaxation. Nuclear stress test in 2011 showed no ischemia. He was recently seen virtually by [name] 05/09/19 for routine follow-up and had elevated blood pressure at that time. Lisinopril was increased to 20mg  daily. Last labs 05/11/19 showed K 3.7, glucose 111, Cr 0.74, normal CBC, 05/2018 LFTs wnl.  He returns for follow-up today feeling well from a heart standpoint without angina, dyspnea, palpitations, orthopnea, syncope. His major complaint is chronic back pain which is managed by pain management. He walks with a walker. His blood pressure is a little better than recent OV, but still not at goal. Recheck by me was 155/80.   Past Medical History:  Diagnosis Date  . AAA (abdominal aortic aneurysm) ([place])   . Back pain    decompression belt bought off of TV  . CAD (coronary artery disease)   . Carotid artery disease ([place])    a. duplex 2015 - 1-39% bilaterally.  . Carotid bruit    November, 2013  . Cataract    bilateral - surgery to remove  . DDD (degenerative disc disease), lumbar    hands  . Dyslipidemia   . GERD (gastroesophageal reflux disease)   . History of kidney stones   . HTN (hypertension)   . Hx of CABG    2004  .  MGUS (monoclonal gammopathy of unknown significance)   . Myocardial infarction ([place])   . 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 Spinal stenosis, lumbar    3-4  . Thrombocytopenia ([place])    Dr. Ralene Ok    Past Surgical History:  Procedure Laterality Date  . BACK SURGERY    . COLONOSCOPY    . COLONOSCOPY WITH PROPOFOL N/A 07/13/2017   Procedure: COLONOSCOPY WITH PROPOFOL;  Surgeon: Manus Gunning, MD;  Location: [facility];  Service: Gastroenterology;  Laterality: N/A;  . CORONARY ARTERY BYPASS GRAFT  2004  . CYSTOSCOPY WITH INSERTION OF UROLIFT N/A 12/14/2018   Procedure: CYSTOSCOPY WITH INSERTION OF UROLIFT;  Surgeon: Franchot Gallo, MD;  Location: [facility];  Service: Urology;  Laterality: N/A;  . DECOMPRESSIVE LUMBAR LAMINECTOMY LEVEL 1 N/A 09/04/2016   Procedure: DECOMPRESSION L3 - L4;  Surgeon: Melina Schools, MD;  Location: [facility];  Service: Orthopedics;  Laterality: N/A;  . EYE SURGERY  2013   bilateral cataract extraction w/ IOL (Dr. Celene Squibb), retina surgery  . JOINT REPLACEMENT    . LUMBAR LAMINECTOMY/DECOMPRESSION MICRODISCECTOMY  09/04/2016   L3-4  . SPINAL CORD STIMULATOR INSERTION N/A 05/13/2019   Procedure: LUMBAR SPINAL CORD STIMULATOR INSERTION;  Surgeon: Clydell Hakim, MD;  Location: [facility];  Service: Neurosurgery;  Laterality: N/A;  LUMBAR SPINAL CORD STIMULATOR INSERTION  . TOTAL HIP ARTHROPLASTY Right 10/2003  Current Medications: Current Meds  Medication Sig  . aspirin 81 MG tablet Take 1 tablet (81 mg total) by mouth daily.  Marland Kitchen docusate sodium (COLACE) 50 MG capsule Take 150 mg by mouth at bedtime.  Marland Kitchen esomeprazole (NEXIUM) 20 MG capsule Take 20 mg by mouth daily at 12 noon.  . fluticasone (FLONASE) 50 MCG/ACT nasal spray PLACE 2 SPRAYS INTO EACH NOSTRIL EVERY DAY  . lisinopril (ZESTRIL) 20 MG tablet Take 1 tablet (20 mg total) by mouth daily.  . metoprolol tartrate (LOPRESSOR) 50 MG tablet Take 1 tablet (50 mg total) by  mouth 2 (two) times daily. Please make yearly appt with Dr. Meda Coffee for April for future refills. 1st attempt  . Multiple Vitamins-Minerals (CENTRUM SILVER ULTRA MENS PO) Take 1 tablet by mouth daily.   . nitroGLYCERIN (NITROSTAT) 0.4 MG SL tablet Place 1 tablet (0.4 mg total) under the tongue every 5 (five) minutes as needed for chest pain.  . simvastatin (ZOCOR) 40 MG tablet Take 1 tablet (40 mg total) by mouth at bedtime. Please make yearly appt with Dr. Meda Coffee for April for future refills. 1st attempt  . traMADol (ULTRAM) 50 MG tablet Take 100 mg by mouth every 8 (eight) hours as needed for moderate pain.      Allergies:   Penicillins   Social History   Socioeconomic History  . Marital status: Married    Spouse name: Not on file  . Number of children: 2  . Years of education: 33  . Highest education level: Not on file  Occupational History  . Occupation: trucking     Fish farm manager: RETIRED  Social Needs  . Financial resource strain: Not hard at all  . Food insecurity    Worry: Never true    Inability: Never true  . Transportation needs    Medical: No    Non-medical: No  Tobacco Use  . Smoking status: Heavy Tobacco Smoker    Packs/day: 0.50    Years: 65.00    Pack years: 32.50    Types: Cigarettes  . Smokeless tobacco: Never Used  . Tobacco comment: smoking since age 79 yrs old  Substance and Sexual Activity  . Alcohol use: Not Currently    Comment: occasional beer  . Drug use: No  . Sexual activity: Not Currently  Lifestyle  . Physical activity    Days per week: 0 days    Minutes per session: 0 min  . Stress: Not at all  Relationships  . Social connections    Talks on phone: More than three times a week    Gets together: More than three times a week    Attends religious service: More than 4 times per year    Active member of club or organization: Yes    Attends meetings of clubs or organizations: More than 4 times per year    Relationship status: Married  Other  Topics Concern  . Not on file  Social History Narrative   HSG, Married '62, 1 son-'70, 1 dtr - '68; 2 grandchildren. Work - Probation officer.   ACP - DNR, DNI, no artificial hydration or feeding, no heroic measures     Family History:  The patient's family history includes Arthritis in his brother; COPD in his mother; Cancer in his paternal grandfather; Heart disease in his brother and brother; Kidney disease in his mother; Stroke in his father.  ROS:   Please see the history of present illness.  All other systems are reviewed and otherwise negative.  PHYSICAL EXAM:   VS:  BP (!) 142/80   Pulse 76   Ht 5\' 10"  (1.778 m)   Wt 167 lb 12.8 oz (76.1 kg)   SpO2 98%   BMI 24.08 kg/m   BMI: Body mass index is 24.08 kg/m. GEN: Well nourished, well developed WM, in no acute distress HEENT: normocephalic, atraumatic Neck: no JVD, carotid bruits, or masses Cardiac: RRR; no murmurs, rubs, or gallops, no edema  Respiratory:  Coarse BS throughout, no wheezing or rales, normal work of breathing GI: soft, nontender, nondistended, + BS MS: no deformity or atrophy Skin: warm and dry, no rash Neuro:  Alert and Oriented x 3, Strength and sensation are intact, follows commands Psych: euthymic mood, full affect  Wt Readings from Last 3 Encounters:  06/03/19 167 lb 12.8 oz (76.1 kg)  05/13/19 168 lb (76.2 kg)  05/11/19 168 lb 1 oz (76.2 kg)      Studies/Labs Reviewed:   EKG:  EKG was not ordered today.  Recent Labs: 05/11/2019: BUN 12; Creatinine, Ser 0.74; Hemoglobin 14.4; Platelets 206; Potassium 3.7; Sodium 139   Lipid Panel    Component Value Date/Time   CHOL 142 01/31/2016 0847   TRIG 77.0 01/31/2016 0847   HDL 48.50 01/31/2016 0847   CHOLHDL 3 01/31/2016 0847   VLDL 15.4 01/31/2016 0847   LDLCALC 78 01/31/2016 0847    Additional studies/ records that were reviewed today include: Summarized above   ASSESSMENT & PLAN:   1. Essential HTN - will increase lisinopril  further to 40mg  daily. I am not certain this will bring his BP to goal so next step would be to swap metoprolol for carvedilol (keeping in mind need to watch for sx of bradycardia given bifascicular block) since the patient wishes to minimize the total number of pills he takes. Would avoid diuretic for now given his h/o urologic issues (empyting/voiding). Amlodipine would be another option if we need further control. [name] already set the patient up for labs with oncology (LFTs/lipids) so will add BMET and TSH to this as well. Also discussed reduction in sodium. I wonder to what degree his chronic back pain is contributing. 2. CAD s/p CABG - asymptomatic, no angina. Continue ASA, BB, statin. He is disinterested in quitting smoking at this time despite counseling. Consider transition from simvastatin to atorvastatin if LDL not at goal. 3. PAD with small AAA and carotid artery disease - he has f/u with vascular next week per his report. Needs BP control as above. Aneurysm precautions also reviewed. 4. Bifascicular block with RBBB, LPFB - asymptomatic, continue surveillance. 5. Hyperlipidemia - lipids are pending as above.  Disposition: F/u with Dr. Meda Coffee 6 months.  Medication Adjustments/Labs and Tests Ordered: Current medicines are reviewed at length with the patient today.  Concerns regarding medicines are outlined above. Medication changes, Labs and Tests ordered today are summarized above and listed in the Patient Instructions accessible in Encounters.   Signed, Charlie Pitter, PA-C  06/03/2019 11:17 AM    [facility] [place], [place], [place]  [address] Phone: [phone]; Fax: [phone]

## 2019-06-03 ENCOUNTER — Other Ambulatory Visit: Payer: Self-pay | Admitting: *Deleted

## 2019-06-03 ENCOUNTER — Encounter: Payer: Self-pay | Admitting: Physician Assistant

## 2019-06-03 ENCOUNTER — Telehealth: Payer: Self-pay

## 2019-06-03 ENCOUNTER — Ambulatory Visit: Payer: PPO | Admitting: Physician Assistant

## 2019-06-03 ENCOUNTER — Other Ambulatory Visit: Payer: Self-pay

## 2019-06-03 VITALS — BP 142/80 | HR 76 | Ht 70.0 in | Wt 167.8 lb

## 2019-06-03 DIAGNOSIS — I2510 Atherosclerotic heart disease of native coronary artery without angina pectoris: Secondary | ICD-10-CM

## 2019-06-03 DIAGNOSIS — I452 Bifascicular block: Secondary | ICD-10-CM | POA: Diagnosis not present

## 2019-06-03 DIAGNOSIS — I739 Peripheral vascular disease, unspecified: Secondary | ICD-10-CM

## 2019-06-03 DIAGNOSIS — E785 Hyperlipidemia, unspecified: Secondary | ICD-10-CM

## 2019-06-03 DIAGNOSIS — I10 Essential (primary) hypertension: Secondary | ICD-10-CM | POA: Diagnosis not present

## 2019-06-03 MED ORDER — LISINOPRIL 40 MG PO TABS: 40.0000 mg | ORAL_TABLET | Freq: Every day | ORAL | 3 refills | Status: DC

## 2019-06-03 NOTE — Telephone Encounter (Signed)
Attempted to call patient to go over pre-screening questions. Phone rang and never was answered then went to a busy tone. Unable to leave message

## 2019-06-03 NOTE — Patient Instructions (Addendum)
  Medication Instructions:  Your physician has recommended you make the following change in your medication:  1.  INCREASE the Lisinopril to 40 mg daily.  You may take 2 of the 20 mg tablets to use them up  If you need a refill on your cardiac medications before your next appointment, please call your pharmacy.   Lab work: You will have the labs drawn at the oncology office on Monday, 06/06/2019  If you have labs (blood work) drawn today and your tests are completely normal, you will receive your results only by: Marland Kitchen MyChart Message (if you have MyChart) OR . A paper copy in the mail If you have any lab test that is abnormal or we need to change your treatment, we will call you to review the results.  Testing/Procedure: None ordered  Follow-Up: At [facility], you and your health needs are our priority.  As part of our continuing mission to provide you with exceptional heart care, we have created designated Provider Care Teams.  These Care Teams include your primary Cardiologist (physician) and Advanced Practice Providers (APPs -  Physician Assistants and Nurse Practitioners) who all work together to provide you with the care you need, when you need it. You will need a follow up appointment in 6 months.  Please call our office 2 months in advance to schedule this appointment.  You may see [name], MD or one of the following Advanced Practice Providers on your designated Care Team:   [place], PA-C Melina Copa, PA-C . Ermalinda Barrios, PA-C  Any Other Special Instructions Will Be Listed Below (If Applicable).  Information About Your Aneurysm  The word "aneurysm" refers to a localized area of enlargement of a blood vessel. Most people think of them in the context of an emergency, but yours was found incidentally. At this point there is nothing you need to do from a procedure standpoint, but there are some important things to keep in mind for day-to-day life. Mainstays of  therapy for aneurysms include good blood pressure control, healthy lifestyle, and avoiding fluoroquinolone antibiotic medications (such as those in the "Cipro" class, ending in "floxacin") due to risk of damage to the aorta. This is a finding I would expect to be monitored periodically by their primary cardiologist. Since aneurysms can run in families, you should discuss your diagnosis with first degree relatives as they may need to be screened for this. Regular mild-moderate physical exercise is OK but avoid heavy lifting/weight lifting over 30lbs, chopping wood, shoveling snow or digging heavy earth with a shovel.     To check your blood pressure, choose a time about 3 hours after taking your blood pressure medicines. Remain seated in a chair for 5 minutes quietly beforehand, then check it. Please get those readings and call us/send in MyChart message with them for our review on Monday or Tuesday for our review.

## 2019-06-06 ENCOUNTER — Other Ambulatory Visit: Payer: Self-pay | Admitting: Internal Medicine

## 2019-06-06 ENCOUNTER — Encounter: Payer: Self-pay | Admitting: Internal Medicine

## 2019-06-06 ENCOUNTER — Inpatient Hospital Stay: Payer: PPO

## 2019-06-06 ENCOUNTER — Telehealth: Payer: Self-pay | Admitting: Internal Medicine

## 2019-06-06 ENCOUNTER — Other Ambulatory Visit: Payer: Self-pay

## 2019-06-06 ENCOUNTER — Inpatient Hospital Stay: Payer: PPO | Attending: Internal Medicine | Admitting: Internal Medicine

## 2019-06-06 VITALS — BP 167/99 | HR 77 | Temp 98.9°F | Resp 20 | Ht 70.0 in | Wt 167.7 lb

## 2019-06-06 DIAGNOSIS — E785 Hyperlipidemia, unspecified: Secondary | ICD-10-CM | POA: Diagnosis not present

## 2019-06-06 DIAGNOSIS — I714 Abdominal aortic aneurysm, without rupture, unspecified: Secondary | ICD-10-CM

## 2019-06-06 DIAGNOSIS — Z87442 Personal history of urinary calculi: Secondary | ICD-10-CM

## 2019-06-06 DIAGNOSIS — I252 Old myocardial infarction: Secondary | ICD-10-CM | POA: Insufficient documentation

## 2019-06-06 DIAGNOSIS — Z7982 Long term (current) use of aspirin: Secondary | ICD-10-CM

## 2019-06-06 DIAGNOSIS — D696 Thrombocytopenia, unspecified: Secondary | ICD-10-CM | POA: Diagnosis not present

## 2019-06-06 DIAGNOSIS — M5136 Other intervertebral disc degeneration, lumbar region: Secondary | ICD-10-CM

## 2019-06-06 DIAGNOSIS — I2510 Atherosclerotic heart disease of native coronary artery without angina pectoris: Secondary | ICD-10-CM

## 2019-06-06 DIAGNOSIS — M549 Dorsalgia, unspecified: Secondary | ICD-10-CM | POA: Diagnosis not present

## 2019-06-06 DIAGNOSIS — Z79899 Other long term (current) drug therapy: Secondary | ICD-10-CM | POA: Diagnosis not present

## 2019-06-06 DIAGNOSIS — M48061 Spinal stenosis, lumbar region without neurogenic claudication: Secondary | ICD-10-CM

## 2019-06-06 DIAGNOSIS — K219 Gastro-esophageal reflux disease without esophagitis: Secondary | ICD-10-CM

## 2019-06-06 DIAGNOSIS — D472 Monoclonal gammopathy: Secondary | ICD-10-CM | POA: Diagnosis not present

## 2019-06-06 DIAGNOSIS — R0989 Other specified symptoms and signs involving the circulatory and respiratory systems: Secondary | ICD-10-CM | POA: Diagnosis not present

## 2019-06-06 DIAGNOSIS — I779 Disorder of arteries and arterioles, unspecified: Secondary | ICD-10-CM

## 2019-06-06 DIAGNOSIS — Z720 Tobacco use: Secondary | ICD-10-CM

## 2019-06-06 DIAGNOSIS — I739 Peripheral vascular disease, unspecified: Secondary | ICD-10-CM

## 2019-06-06 DIAGNOSIS — I452 Bifascicular block: Secondary | ICD-10-CM

## 2019-06-06 DIAGNOSIS — I10 Essential (primary) hypertension: Secondary | ICD-10-CM | POA: Diagnosis not present

## 2019-06-06 LAB — CBC WITH DIFFERENTIAL (CANCER CENTER ONLY)
Abs Immature Granulocytes: 0.03 10*3/uL (ref 0.00–0.07)
Basophils Absolute: 0 10*3/uL (ref 0.0–0.1)
Basophils Relative: 0 %
Eosinophils Absolute: 0.7 10*3/uL — ABNORMAL HIGH (ref 0.0–0.5)
Eosinophils Relative: 7 %
HCT: 44.1 % (ref 39.0–52.0)
Hemoglobin: 14.5 g/dL (ref 13.0–17.0)
Immature Granulocytes: 0 %
Lymphocytes Relative: 22 %
Lymphs Abs: 2.2 10*3/uL (ref 0.7–4.0)
MCH: 30.3 pg (ref 26.0–34.0)
MCHC: 32.9 g/dL (ref 30.0–36.0)
MCV: 92.3 fL (ref 80.0–100.0)
Monocytes Absolute: 0.7 10*3/uL (ref 0.1–1.0)
Monocytes Relative: 7 %
Neutro Abs: 6.1 10*3/uL (ref 1.7–7.7)
Neutrophils Relative %: 64 %
Platelet Count: 217 10*3/uL (ref 150–400)
RBC: 4.78 MIL/uL (ref 4.22–5.81)
RDW: 13.2 % (ref 11.5–15.5)
WBC Count: 9.7 10*3/uL (ref 4.0–10.5)
nRBC: 0 % (ref 0.0–0.2)

## 2019-06-06 LAB — CMP (CANCER CENTER ONLY)
ALT: 16 U/L (ref 0–44)
AST: 17 U/L (ref 15–41)
Albumin: 4 g/dL (ref 3.5–5.0)
Alkaline Phosphatase: 78 U/L (ref 38–126)
Anion gap: 8 (ref 5–15)
BUN: 11 mg/dL (ref 8–23)
CO2: 25 mmol/L (ref 22–32)
Calcium: 9.1 mg/dL (ref 8.9–10.3)
Chloride: 107 mmol/L (ref 98–111)
Creatinine: 0.82 mg/dL (ref 0.61–1.24)
GFR, Est AFR Am: >60 mL/min (ref >60)
GFR, Estimated: >60 mL/min (ref >60)
Glucose, Bld: 96 mg/dL (ref 70–99)
Potassium: 4.1 mmol/L (ref 3.5–5.1)
Sodium: 140 mmol/L (ref 135–145)
Total Bilirubin: 0.4 mg/dL (ref 0.3–1.2)
Total Protein: 7.1 g/dL (ref 6.5–8.1)

## 2019-06-06 LAB — LACTATE DEHYDROGENASE: LDH: 150 U/L (ref 98–192)

## 2019-06-06 NOTE — Telephone Encounter (Signed)
No los entered in, call MD and he stated a lab and a f/u in a year.  Printed calendar.

## 2019-06-06 NOTE — Telephone Encounter (Deleted)
Scheduled appt per 6/29 los. Printed calendar.

## 2019-06-06 NOTE — Progress Notes (Signed)
[facility] Telephone:([phone]   Fax:([phone]  OFFICE PROGRESS NOTE  Hoyt Koch, MD [place] [address]  DIAGNOSIS:  1) monoclonal history of undetermined significance. 2) thrombocytopenia  PRIOR THERAPY: None  CURRENT THERAPY: Observation  INTERVAL HISTORY: [name] [name] 78 y.o. male returns to the clinic today for follow-up visit.  The patient is feeling fine today with no concerning complaints.  He denied having any chest pain, shortness of breath, cough or hemoptysis.  He denied having any fever or chills.  He has no nausea, vomiting, diarrhea or constipation.  He denied having any headache or visual changes.  He had the stimulator placed few weeks ago.  He is feeling much better.  He is here today for evaluation and repeat blood work.  MEDICAL HISTORY: Past Medical History:  Diagnosis Date  . AAA (abdominal aortic aneurysm) ([place])   . Back pain    decompression belt bought off of TV  . CAD (coronary artery disease)   . Carotid artery disease ([place])    a. duplex 2015 - 1-39% bilaterally.  . Carotid bruit    November, 2013  . Cataract    bilateral - surgery to remove  . DDD (degenerative disc disease), lumbar    hands  . Dyslipidemia   . GERD (gastroesophageal reflux disease)   . History of kidney stones   . HTN (hypertension)   . Hx of CABG    2004  . MGUS (monoclonal gammopathy of unknown significance)   . Myocardial infarction ([place])   . 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 Spinal stenosis, lumbar    3-4  . Thrombocytopenia ([place])    Dr. Ralene Ok    ALLERGIES:  is allergic to penicillins.  MEDICATIONS:  Current Outpatient Medications  Medication Sig Dispense Refill  . aspirin 81 MG tablet Take 1 tablet (81 mg total) by mouth daily. 90 tablet 3  . docusate sodium (COLACE) 50 MG capsule Take 150 mg by mouth at bedtime.    Marland Kitchen esomeprazole (NEXIUM) 20 MG capsule  Take 20 mg by mouth daily at 12 noon.    . fluticasone (FLONASE) 50 MCG/ACT nasal spray PLACE 2 SPRAYS INTO EACH NOSTRIL EVERY DAY 16 g 2  . lisinopril (ZESTRIL) 40 MG tablet Take 1 tablet (40 mg total) by mouth daily. 90 tablet 3  . metoprolol tartrate (LOPRESSOR) 50 MG tablet Take 1 tablet (50 mg total) by mouth 2 (two) times daily. Please make yearly appt with Dr. Meda Coffee for April for future refills. 1st attempt 180 tablet 0  . Multiple Vitamins-Minerals (CENTRUM SILVER ULTRA MENS PO) Take 1 tablet by mouth daily.     . nitroGLYCERIN (NITROSTAT) 0.4 MG SL tablet Place 1 tablet (0.4 mg total) under the tongue every 5 (five) minutes as needed for chest pain. 25 tablet 3  . simvastatin (ZOCOR) 40 MG tablet Take 1 tablet (40 mg total) by mouth at bedtime. Please make yearly appt with Dr. Meda Coffee for April for future refills. 1st attempt 90 tablet 0  . traMADol (ULTRAM) 50 MG tablet Take 100 mg by mouth every 8 (eight) hours as needed for moderate pain.      No current facility-administered medications for this visit.     SURGICAL HISTORY:  Past Surgical History:  Procedure Laterality Date  . BACK SURGERY    . COLONOSCOPY    . COLONOSCOPY WITH PROPOFOL N/A 07/13/2017  Procedure: COLONOSCOPY WITH PROPOFOL;  Surgeon: Manus Gunning, MD;  Location: [facility];  Service: Gastroenterology;  Laterality: N/A;  . CORONARY ARTERY BYPASS GRAFT  2004  . CYSTOSCOPY WITH INSERTION OF UROLIFT N/A 12/14/2018   Procedure: CYSTOSCOPY WITH INSERTION OF UROLIFT;  Surgeon: Franchot Gallo, MD;  Location: [facility];  Service: Urology;  Laterality: N/A;  . DECOMPRESSIVE LUMBAR LAMINECTOMY LEVEL 1 N/A 09/04/2016   Procedure: DECOMPRESSION L3 - L4;  Surgeon: Melina Schools, MD;  Location: [facility];  Service: Orthopedics;  Laterality: N/A;  . EYE SURGERY  2013   bilateral cataract extraction w/ IOL (Dr. Celene Squibb), retina surgery  . JOINT REPLACEMENT    . LUMBAR LAMINECTOMY/DECOMPRESSION MICRODISCECTOMY  09/04/2016    L3-4  . SPINAL CORD STIMULATOR INSERTION N/A 05/13/2019   Procedure: LUMBAR SPINAL CORD STIMULATOR INSERTION;  Surgeon: Clydell Hakim, MD;  Location: [facility];  Service: Neurosurgery;  Laterality: N/A;  LUMBAR SPINAL CORD STIMULATOR INSERTION  . TOTAL HIP ARTHROPLASTY Right 10/2003    REVIEW OF SYSTEMS:  A comprehensive review of systems was negative except for: Musculoskeletal: positive for back pain   PHYSICAL EXAMINATION: General appearance: alert, cooperative and no distress Head: Normocephalic, without obvious abnormality, atraumatic Neck: no adenopathy, no JVD, supple, symmetrical, trachea midline and thyroid not enlarged, symmetric, no tenderness/mass/nodules Lymph nodes: Cervical, supraclavicular, and axillary nodes normal. Resp: clear to auscultation bilaterally Back: symmetric, no curvature. ROM normal. No CVA tenderness. Cardio: regular rate and rhythm, S1, S2 normal, no murmur, click, rub or gallop GI: soft, non-tender; bowel sounds normal; no masses,  no organomegaly Extremities: extremities normal, atraumatic, no cyanosis or edema  ECOG PERFORMANCE STATUS: 1 - Symptomatic but completely ambulatory  Blood pressure (!) 167/99, pulse 77, temperature 98.9 F (37.2 C), temperature source Oral, resp. rate 20, height 5\' 10"  (1.778 m), weight 167 lb 11.2 oz (76.1 kg), SpO2 98 %.  LABORATORY DATA: Lab Results  Component Value Date   WBC 9.7 06/06/2019   HGB 14.5 06/06/2019   HCT 44.1 06/06/2019   MCV 92.3 06/06/2019   PLT 217 06/06/2019      Chemistry      Component Value Date/Time   NA 139 05/11/2019 1154   NA 139 06/04/2017 1031   K 3.7 05/11/2019 1154   K 5.1 06/04/2017 1031   CL 109 05/11/2019 1154   CL 108 (H) 05/23/2013 1514   CO2 24 05/11/2019 1154   CO2 26 06/04/2017 1031   BUN 12 05/11/2019 1154   BUN 12.0 06/04/2017 1031   CREATININE 0.74 05/11/2019 1154   CREATININE 0.8 06/04/2017 1031      Component Value Date/Time   CALCIUM 8.9 05/11/2019 1154    CALCIUM 9.5 06/04/2017 1031   ALKPHOS 85 06/03/2018 1007   ALKPHOS 77 06/04/2017 1031   AST 19 06/03/2018 1007   AST 18 06/04/2017 1031   ALT 19 06/03/2018 1007   ALT 16 06/04/2017 1031   BILITOT 0.5 06/03/2018 1007   BILITOT 0.44 06/04/2017 1031       RADIOGRAPHIC STUDIES: Dg Thoracic Spine 1 View  Result Date: 05/13/2019 CLINICAL DATA:  Intraoperative imaging for spinal stimulator placement. EXAM: DG C-ARM 61-120 MIN; THORACIC SPINE - 1 VIEW COMPARISON:  PA and lateral chest 01/01/2010. FINDINGS: Single fluoroscopic AP view of the thoracic spine is provided. Exact levels are difficult to determine but it appears to extend from approximately mid T7-8 to T9-10. No acute abnormality. IMPRESSION: Intraoperative imaging for spinal stimulator placement. Electronically Signed   By: Inge Rise  M.D.   On: 05/13/2019 09:59   Dg C-arm 1-60 Min  Result Date: 05/13/2019 CLINICAL DATA:  Intraoperative imaging for spinal stimulator placement. EXAM: DG C-ARM 61-120 MIN; THORACIC SPINE - 1 VIEW COMPARISON:  PA and lateral chest 01/01/2010. FINDINGS: Single fluoroscopic AP view of the thoracic spine is provided. Exact levels are difficult to determine but it appears to extend from approximately mid T7-8 to T9-10. No acute abnormality. IMPRESSION: Intraoperative imaging for spinal stimulator placement. Electronically Signed   By: Inge Rise M.D.   On: 05/13/2019 09:59    ASSESSMENT AND PLAN:  This is a very pleasant 78 years old white male with history of monoclonal gammopathy as well as thrombocytopenia he is currently on observation.  The patient is doing fine today with no concerning complaints.  CBC and comprehensive metabolic panel are unremarkable.  The remaining myeloma panel is still pending. I recommended for the patient to continue on observation with repeat blood work in 1 year. He was advised to call immediately if he has any concerning symptoms in the interval. The patient voices  understanding of current disease status and treatment options and is in agreement with the current care plan. All questions were answered. The patient knows to call the clinic with any problems, questions or concerns. We can certainly see the patient much sooner if necessary. I spent 10 minutes counseling the patient face to face. The total time spent in the appointment was 15 minutes.  Disclaimer: This note was dictated with voice recognition software. Similar sounding words can inadvertently be transcribed and may not be corrected upon review.

## 2019-06-07 LAB — IGG, IGA, IGM
IgA: 33 mg/dL — ABNORMAL LOW (ref 61–437)
IgG (Immunoglobin G), Serum: 728 mg/dL (ref 603–1613)
IgM (Immunoglobulin M), Srm: 21 mg/dL (ref 15–143)

## 2019-06-07 LAB — KAPPA/LAMBDA LIGHT CHAINS
Kappa free light chain: 14.1 mg/L (ref 3.3–19.4)
Kappa, lambda light chain ratio: 0.02 — ABNORMAL LOW (ref 0.26–1.65)
Lambda free light chains: 707.5 mg/L — ABNORMAL HIGH (ref 5.7–26.3)

## 2019-06-07 LAB — BETA 2 MICROGLOBULIN, SERUM: Beta-2 Microglobulin: 1.6 mg/L (ref 0.6–2.4)

## 2019-06-13 ENCOUNTER — Encounter: Payer: Self-pay | Admitting: Family

## 2019-06-13 ENCOUNTER — Ambulatory Visit (HOSPITAL_COMMUNITY)
Admission: RE | Admit: 2019-06-13 | Discharge: 2019-06-13 | Disposition: A | Discharge status: Home / Self Care | Payer: PPO | Source: Ambulatory Visit | Attending: Family | Admitting: Family

## 2019-06-13 ENCOUNTER — Other Ambulatory Visit: Payer: Self-pay

## 2019-06-13 ENCOUNTER — Ambulatory Visit: Payer: PPO | Admitting: Family

## 2019-06-13 ENCOUNTER — Ambulatory Visit (INDEPENDENT_AMBULATORY_CARE_PROVIDER_SITE_OTHER): Payer: PPO | Admitting: Family

## 2019-06-13 ENCOUNTER — Ambulatory Visit (HOSPITAL_COMMUNITY): Payer: PPO

## 2019-06-13 VITALS — BP 147/81 | HR 66 | Temp 97.5°F | Resp 18 | Ht 70.0 in | Wt 168.0 lb

## 2019-06-13 DIAGNOSIS — F17200 Nicotine dependence, unspecified, uncomplicated: Secondary | ICD-10-CM

## 2019-06-13 DIAGNOSIS — I714 Abdominal aortic aneurysm, without rupture, unspecified: Secondary | ICD-10-CM

## 2019-06-13 NOTE — Progress Notes (Signed)
[facility] [place]   CC: Follow up Abdominal Aortic Aneurysm  History of Present Illness  [name] [name] is a 78 y.o. (Apr 09, 1941) male whom Dr. Donnetta Hutching has been monitoring since 2017 for an incidental finding of an abdominal aortic aneurysm during workup for degenerative disc disease in his back. He did have back surgery and reports that he has had minimal improvement with this. He had a nerve stimulator inserted which helps somewhat. He sees Dr. Nelva Bush for pain management.  He does walk with a cane but is not able to do many of the things that he enjoys doing. He does not have any new cardiac difficulty. He has no symptoms referable to his aneurysm.  He states his blood pressure medication is actively being adjusted by Dr. Meda Coffee, his cardiologist.   The patient denies any history of stroke or TIA symptoms.  Diabetic: No Tobacco use: smoker  (1/2 ppd currently, started in his teens)   Past Medical History:  Diagnosis Date  . AAA (abdominal aortic aneurysm) ([place])   . Back pain    decompression belt bought off of TV  . CAD (coronary artery disease)   . Carotid artery disease ([place])    a. duplex 2015 - 1-39% bilaterally.  . Carotid bruit    November, 2013  . Cataract    bilateral - surgery to remove  . DDD (degenerative disc disease), lumbar    hands  . Dyslipidemia   . GERD (gastroesophageal reflux disease)   . History of kidney stones   . HTN (hypertension)   . Hx of CABG    2004  . MGUS (monoclonal gammopathy of unknown significance)   . Myocardial infarction ([place])   . 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 Spinal stenosis, lumbar    3-4  . Thrombocytopenia ([place])    Dr. Ralene Ok   Past Surgical History:  Procedure Laterality Date  . BACK SURGERY    . COLONOSCOPY    . COLONOSCOPY WITH PROPOFOL N/A 07/13/2017   Procedure: COLONOSCOPY WITH PROPOFOL;  Surgeon: Manus Gunning, MD;  Location: [facility];   Service: Gastroenterology;  Laterality: N/A;  . CORONARY ARTERY BYPASS GRAFT  2004  . CYSTOSCOPY WITH INSERTION OF UROLIFT N/A 12/14/2018   Procedure: CYSTOSCOPY WITH INSERTION OF UROLIFT;  Surgeon: Franchot Gallo, MD;  Location: [facility];  Service: Urology;  Laterality: N/A;  . DECOMPRESSIVE LUMBAR LAMINECTOMY LEVEL 1 N/A 09/04/2016   Procedure: DECOMPRESSION L3 - L4;  Surgeon: Melina Schools, MD;  Location: [facility];  Service: Orthopedics;  Laterality: N/A;  . EYE SURGERY  2013   bilateral cataract extraction w/ IOL (Dr. Celene Squibb), retina surgery  . JOINT REPLACEMENT    . LUMBAR LAMINECTOMY/DECOMPRESSION MICRODISCECTOMY  09/04/2016   L3-4  . SPINAL CORD STIMULATOR INSERTION N/A 05/13/2019   Procedure: LUMBAR SPINAL CORD STIMULATOR INSERTION;  Surgeon: Clydell Hakim, MD;  Location: [facility];  Service: Neurosurgery;  Laterality: N/A;  LUMBAR SPINAL CORD STIMULATOR INSERTION  . TOTAL HIP ARTHROPLASTY Right 10/2003   Social History Social History   Socioeconomic History  . Marital status: Married    Spouse name: Not on file  . Number of children: 2  . Years of education: 50  . Highest education level: Not on file  Occupational History  . Occupation: trucking     Fish farm manager: RETIRED  Social Needs  . Financial resource strain: Not hard at all  . Food insecurity    Worry: Never  true    Inability: Never true  . Transportation needs    Medical: No    Non-medical: No  Tobacco Use  . Smoking status: Heavy Tobacco Smoker    Packs/day: 0.50    Years: 65.00    Pack years: 32.50    Types: Cigarettes  . Smokeless tobacco: Never Used  . Tobacco comment: smoking since age 4 yrs old  Substance and Sexual Activity  . Alcohol use: Not Currently    Comment: occasional beer  . Drug use: No  . Sexual activity: Not Currently  Lifestyle  . Physical activity    Days per week: 0 days    Minutes per session: 0 min  . Stress: Not at all  Relationships  . Social connections    Talks on phone: More than  three times a week    Gets together: More than three times a week    Attends religious service: More than 4 times per year    Active member of club or organization: Yes    Attends meetings of clubs or organizations: More than 4 times per year    Relationship status: Married  . Intimate partner violence    Fear of current or ex partner: No    Emotionally abused: No    Physically abused: No    Forced sexual activity: No  Other Topics Concern  . Not on file  Social History Narrative   HSG, Married '62, 1 son-'70, 1 dtr - '68; 2 grandchildren. Work - Probation officer.   ACP - DNR, DNI, no artificial hydration or feeding, no heroic measures   Family History Family History  Problem Relation Age of Onset  . Kidney disease Mother   . COPD Mother   . Stroke Father        cerebral hemorrhage  . Heart disease Brother   . Heart disease Brother   . Arthritis Brother   . Cancer Paternal Grandfather     Current Outpatient Medications on File Prior to Visit  Medication Sig Dispense Refill  . aspirin 81 MG tablet Take 1 tablet (81 mg total) by mouth daily. 90 tablet 3  . docusate sodium (COLACE) 50 MG capsule Take 150 mg by mouth at bedtime.    Marland Kitchen esomeprazole (NEXIUM) 20 MG capsule Take 20 mg by mouth daily at 12 noon.    . fluticasone (FLONASE) 50 MCG/ACT nasal spray PLACE 2 SPRAYS INTO EACH NOSTRIL EVERY DAY 16 g 2  . lisinopril (ZESTRIL) 40 MG tablet Take 1 tablet (40 mg total) by mouth daily. 90 tablet 3  . metoprolol tartrate (LOPRESSOR) 50 MG tablet Take 1 tablet (50 mg total) by mouth 2 (two) times daily. Please make yearly appt with Dr. Meda Coffee for April for future refills. 1st attempt 180 tablet 0  . Multiple Vitamins-Minerals (CENTRUM SILVER ULTRA MENS PO) Take 1 tablet by mouth daily.     . nitroGLYCERIN (NITROSTAT) 0.4 MG SL tablet Place 1 tablet (0.4 mg total) under the tongue every 5 (five) minutes as needed for chest pain. 25 tablet 3  . simvastatin (ZOCOR) 40 MG tablet  Take 1 tablet (40 mg total) by mouth at bedtime. Please make yearly appt with Dr. Meda Coffee for April for future refills. 1st attempt 90 tablet 0  . traMADol (ULTRAM) 50 MG tablet Take 100 mg by mouth every 8 (eight) hours as needed for moderate pain.      No current facility-administered medications on file prior to visit.    Allergies  Allergen Reactions  . Penicillins Other (See Comments)    PATIENT HAS HAD A PCN REACTION WITH IMMEDIATE RASH, FACIAL/TONGUE/THROAT SWELLING, SOB, OR LIGHTHEADEDNESS WITH HYPOTENSION:   #  YES  #    Has patient had a PCN reaction causing severe rash involving mucus membranes or skin necrosis: No Has patient had a PCN reaction that required hospitalization No Has patient had a PCN reaction occurring within the last 10 years: No If all of the above answers are "NO", then may proceed with Cephalosporin use.  Passed out    ROS: See HPI for pertinent positives and negatives.  Physical Examination  Vitals:   06/13/19 0933  BP: (!) 147/81  Pulse: 66  Resp: 18  Temp: (!) 97.5 F (36.4 C)  TempSrc: Temporal  SpO2: 99%  Weight: 168 lb (76.2 kg)  Height: 5\' 10"  (1.778 m)   Body mass index is 24.11 kg/m.  General: A&O x 3, WD, male, walking with a cane. HEENT: Grossly intact and WNL.  Pulmonary: Sym exp, respirations are non labored, adequate air movt, CTAB, no rales, rhonchi, or wheezing. Cardiac: Regular rhythm and rate, no detected murmur.  Carotid Bruits Right Left   Negative Negative   Adominal aortic pulse is not palpable Radial pulses are 2+                          VASCULAR EXAM:                                                                                                         LE Pulses Right Left       FEMORAL  2+ palpable  2+ palpable        POPLITEAL  2+ palpable   1+ palpable       POSTERIOR TIBIAL  1+ palpable   2+ palpable        DORSALIS PEDIS      ANTERIOR TIBIAL 1+ palpable  2+ palpable     Gastrointestinal: soft,  NTND, -G/R, - HSM, - masses palpated, - CVAT B. Musculoskeletal: M/S 5/5 throughout, Extremities without ischemic changes. Skin: No rashes, no ulcers, no cellulitis.   Neurologic: CN 2-12 intact except mild hearing loss, Pain and light touch intact in extremities are intact, Motor exam as listed above. Psychiatric: Normal thought content, mood appropriate to clinical situation.    DATA  AAA Duplex (06/13/2019): Abdominal Aorta Findings: +-----------+-------+----------+----------+--------+--------+--------+ Location   AP (cm)Trans (cm)PSV (cm/s)WaveformThrombusComments +-----------+-------+----------+----------+--------+--------+--------+ Proximal   1.55   1.70      46                                 +-----------+-------+----------+----------+--------+--------+--------+ Mid        2.18   2.62      51                                 +-----------+-------+----------+----------+--------+--------+--------+ Distal  4.53   4.45      73                Present          +-----------+-------+----------+----------+--------+--------+--------+ RT CIA Prox2.2    2.2       44                                 +-----------+-------+----------+----------+--------+--------+--------+ LT CIA Prox1.4    1.8       75                                 +-----------+-------+----------+----------+--------+--------+--------+ Summary: Abdominal Aorta: There is evidence of abnormal dilitation of the Distal Abdominal aorta. The largest aortic measurement is 4.5 cm. The largest aortic diameter has increased compared to prior exam. Previous diameter measurement was 4.0 cm obtained on  09/08/17.    Medical Decision Making  The patient is a 78 y.o. male who presents with asymptomatic AAA with an increase in size to 4.5 cm today compared to 4.0 cm in 2018.   Based on this patient's exam and diagnostic studies, the patient will follow up in 6 months  with the following  studies: AAA duplex.   Over 3 minutes was spent counseling patient re smoking cessation, and patient was given several free resources re smoking cessation.   Consideration for repair of AAA would be made when the size is 5.5cm, growth > 1 cm/yr, and symptomatic status.  Abdominal aortic aneurysm less than 5-1/2 cm in diameter has less then 1/2% risk of rupture per year.        The patient was given information about AAA including signs, symptoms, treatment, and how to minimize the risk of enlargement and rupture of aneurysms.    I emphasized the importance of maximal medical management including strict control of blood pressure, blood glucose, and lipid levels, antiplatelet agents, obtaining regular exercise, and cessation of smoking.   The patient was advised to call 911 should the patient experience sudden onset abdominal or back pain.   Thank you for allowing [place] to participate in this patient's care.  Clemon Chambers, RN, MSN, FNP-C [facility] [place] Office: [facility] Physician: Trula Slade  06/13/2019, 9:45 AM

## 2019-06-13 NOTE — Patient Instructions (Addendum)
Before your next abdominal ultrasound: ° °Avoid gas forming foods and beverages the day before the test.   °Take two Extra-Strength Gas-X capsules at bedtime the night before the test. °Take another two Extra-Strength Gas-X capsules in the middle of the night if you get up to the restroom, if not, first thing in the morning with water.  °Do not chew gum.  ° ° ° °Steps to Quit Smoking °Smoking tobacco is the leading cause of preventable death. It can affect almost every organ in the body. Smoking puts you and people around you at risk for many serious, long-lasting (chronic) diseases. Quitting smoking can be hard, but it is one of the best things that you can do for your health. It is never too late to quit. °How do I get ready to quit? °When you decide to quit smoking, make a plan to help you succeed. Before you quit: °· Pick a date to quit. Set a date within the next 2 weeks to give you time to prepare. °· Write down the reasons why you are quitting. Keep this list in places where you will see it often. °· Tell your family, friends, and co-workers that you are quitting. Their support is important. °· Talk with your doctor about the choices that may help you quit. °· Find out if your health insurance will pay for these treatments. °· Know the people, places, things, and activities that make you want to smoke (triggers). Avoid them. °What first steps can I take to quit smoking? °· Throw away all cigarettes at home, at work, and in your car. °· Throw away the things that you use when you smoke, such as ashtrays and lighters. °· Clean your car. Make sure to empty the ashtray. °· Clean your home, including curtains and carpets. °What can I do to help me quit smoking? °Talk with your doctor about taking medicines and seeing a counselor at the same time. You are more likely to succeed when you do both. °· If you are pregnant or breastfeeding, talk with your doctor about counseling or other ways to quit smoking. Do not  take medicine to help you quit smoking unless your doctor tells you to do so. °To quit smoking: °Quit right away °· Quit smoking totally, instead of slowly cutting back on how much you smoke over a period of time. °· Go to counseling. You are more likely to quit if you go to counseling sessions regularly. °Take medicine °You may take medicines to help you quit. Some medicines need a prescription, and some you can buy over-the-counter. Some medicines may contain a drug called nicotine to replace the nicotine in cigarettes. Medicines may: °· Help you to stop having the desire to smoke (cravings). °· Help to stop the problems that come when you stop smoking (withdrawal symptoms). °Your doctor may ask you to use: °· Nicotine patches, gum, or lozenges. °· Nicotine inhalers or sprays. °· Non-nicotine medicine that is taken by mouth. °Find resources °Find resources and other ways to help you quit smoking and remain smoke-free after you quit. These resources are most helpful when you use them often. They include: °· Online chats with a counselor. °· Phone quitlines. °· Printed self-help materials. °· Support groups or group counseling. °· Text messaging programs. °· Mobile phone apps. Use apps on your mobile phone or tablet that can help you stick to your quit plan. There are many free apps for mobile phones and tablets as well as websites. Examples include Quit   Guide from the CDC and smokefree.gov ° °What things can I do to make it easier to quit? ° °· Talk to your family and friends. Ask them to support and encourage you. °· Call a phone quitline (1-800-QUIT-NOW), reach out to support groups, or work with a counselor. °· Ask people who smoke to not smoke around you. °· Avoid places that make you want to smoke, such as: °? Bars. °? Parties. °? Smoke-break areas at work. °· Spend time with people who do not smoke. °· Lower the stress in your life. Stress can make you want to smoke. Try these things to help your  stress: °? Getting regular exercise. °? Doing deep-breathing exercises. °? Doing yoga. °? Meditating. °? Doing a body scan. To do this, close your eyes, focus on one area of your body at a time from head to toe. Notice which parts of your body are tense. Try to relax the muscles in those areas. °How will I feel when I quit smoking? °Day 1 to 3 weeks °Within the first 24 hours, you may start to have some problems that come from quitting tobacco. These problems are very bad 2-3 days after you quit, but they do not often last for more than 2-3 weeks. You may get these symptoms: °· Mood swings. °· Feeling restless, nervous, angry, or annoyed. °· Trouble concentrating. °· Dizziness. °· Strong desire for high-sugar foods and nicotine. °· Weight gain. °· Trouble pooping (constipation). °· Feeling like you may vomit (nausea). °· Coughing or a sore throat. °· Changes in how the medicines that you take for other issues work in your body. °· Depression. °· Trouble sleeping (insomnia). °Week 3 and afterward °After the first 2-3 weeks of quitting, you may start to notice more positive results, such as: °· Better sense of smell and taste. °· Less coughing and sore throat. °· Slower heart rate. °· Lower blood pressure. °· Clearer skin. °· Better breathing. °· Fewer sick days. °Quitting smoking can be hard. Do not give up if you fail the first time. Some people need to try a few times before they succeed. Do your best to stick to your quit plan, and talk with your doctor if you have any questions or concerns. °Summary °· Smoking tobacco is the leading cause of preventable death. Quitting smoking can be hard, but it is one of the best things that you can do for your health. °· When you decide to quit smoking, make a plan to help you succeed. °· Quit smoking right away, not slowly over a period of time. °· When you start quitting, seek help from your doctor, family, or friends. °This information is not intended to replace advice  given to you by your health care provider. Make sure you discuss any questions you have with your health care provider. °Document Released: 09/20/2009 Document Revised: 02/11/2019 Document Reviewed: 02/12/2019 °Elsevier Patient Education © 2020 [employer] Inc[employer] ° ° ° ° ° °Abdominal Aortic Aneurysm ° °An aneurysm is a bulge in one of the blood vessels that carry blood away from the heart (artery). It happens when blood pushes up against a weak or damaged place in the wall of an artery. An abdominal aortic aneurysm happens in the main artery of the body (aorta). °Some aneurysms may not cause problems. If it grows, it can burst or tear, causing bleeding inside the body. This is an emergency. It needs to be treated right away. °What are the causes? °The exact cause of this condition is   not known. °What increases the risk? °The following may make you more likely to get this condition: °· Being a male who is 60 years of age or older. °· Being white (Caucasian). °· Using tobacco. °· Having a family history of aneurysms. °· Having the following conditions: °? Hardening of the arteries (arteriosclerosis). °? Inflammation of the walls of an artery (arteritis). °? Certain genetic conditions. °? Being very overweight (obesity). °? An infection in the wall of the aorta (infectious aortitis). °? High cholesterol. °? High blood pressure (hypertension). °What are the signs or symptoms? °Symptoms depend on the size of the aneurysm and how fast it is growing. Most grow slowly and do not cause any symptoms. If symptoms do occur, they may include: °· Pain in the belly (abdomen), side, or back. °· Feeling full after eating only small amounts of food. °· Feeling a throbbing lump in the belly. °Symptoms that the aneurysm has burst (ruptured) include: °· Sudden, very bad pain in the belly, side, or back. °· Feeling sick to your stomach (nauseous). °· Throwing up (vomiting). °· Feeling light-headed or passing out. °How is this  treated? °Treatment for this condition depends on: °· The size of the aneurysm. °· How fast it is growing. °· Your age. °· Your risk of having it burst. °If your aneurysm is smaller than 2 inches (5 cm), your doctor may manage it by: °· Checking it often to see if it is getting bigger. You may have an imaging test (ultrasound) to check it every 3-6 months, every year, or every few years. °· Giving you medicines to: °? Control blood pressure. °? Treat pain. °? Fight infection. °If your aneurysm is larger than 2 inches (5 cm), you may need surgery to fix it. °Follow these instructions at home: °Lifestyle °· Do not use any products that have nicotine or tobacco in them. This includes cigarettes, e-cigarettes, and chewing tobacco. If you need help quitting, ask your doctor. °· Get regular exercise. Ask your doctor what types of exercise are best for you. °Eating and drinking °· Eat a heart-healthy diet. This includes eating plenty of: °? Fresh fruits and vegetables. °? Whole grains. °? Low-fat (lean) protein. °? Low-fat dairy products. °· Avoid foods that are high in saturated fat and cholesterol. These foods include red meat and some dairy products. °· Do not drink alcohol if: °? Your doctor tells you not to drink. °? You are pregnant, may be pregnant, or are planning to become pregnant. °· If you drink alcohol: °? Limit how much you use to: °§ 0-1 drink a day for women. °§ 0-2 drinks a day for men. °? Be aware of how much alcohol is in your drink. In the [place] one drink equals any of these: °§ One typical bottle of beer (12 oz). °§ One-half glass of wine (5 oz). °§ One shot of hard liquor (1½ oz). °General instructions °· Take over-the-counter and prescription medicines only as told by your doctor. °· Keep your blood pressure within normal limits. Ask your doctor what your blood pressure should be. °· Have your blood sugar (glucose) level and cholesterol levels checked regularly. Keep your blood sugar level and  cholesterol levels within normal limits. °· Avoid heavy lifting and activities that take a lot of effort. Ask your doctor what activities are safe for you. °· Keep all follow-up visits as told by your doctor. This is important. °? Talk to your doctor about regular screenings to see if the aneurysm is getting bigger. °  Contact a doctor if you: °· Have pain in your belly, side, or back. °· Have a throbbing feeling in your belly. °· Have a family history of aneurysms. °Get help right away if you: °· Have sudden, bad pain in your belly, side, or back. °· Feel sick to your stomach. °· Throw up. °· Have trouble pooping (constipation). °· Have trouble peeing (urinating). °· Feel light-headed. °· Have a fast heart rate when you stand. °· Have sweaty skin that is cold to the touch (clammy). °· Have shortness of breath. °· Have a fever. °These symptoms may be an emergency. Do not wait to see if the symptoms will go away. Get medical help right away. Call your local emergency services (911 in the [place] Do not drive yourself to the hospital. °Summary °· An aneurysm is a bulge in one of the blood vessels that carry blood away from the heart (artery). Some aneurysms may not cause problems. °· You may need to have yours checked often. If it grows, it can burst or tear. This causes bleeding inside the body. It needs to be treated right away. °· Follow instructions from your doctor about healthy lifestyle changes. °· Keep all follow-up visits as told by your doctor. This is important. °This information is not intended to replace advice given to you by your health care provider. Make sure you discuss any questions you have with your health care provider. °Document Released: 03/21/2013 Document Revised: 03/14/2019 Document Reviewed: 07/03/2018 °Elsevier Patient Education © 2020 Elsevier Inc. ° °

## 2019-06-27 ENCOUNTER — Other Ambulatory Visit: Payer: Self-pay | Admitting: Cardiology

## 2019-06-27 MED ORDER — SIMVASTATIN 40 MG PO TABS: 40.0000 mg | ORAL_TABLET | Freq: Every day | ORAL | 3 refills | Status: DC

## 2019-06-27 MED ORDER — METOPROLOL TARTRATE 50 MG PO TABS: 50.0000 mg | ORAL_TABLET | Freq: Two times a day (BID) | ORAL | 3 refills | Status: DC

## 2019-06-29 DIAGNOSIS — Z9689 Presence of other specified functional implants: Secondary | ICD-10-CM | POA: Diagnosis not present

## 2019-06-29 DIAGNOSIS — I10 Essential (primary) hypertension: Secondary | ICD-10-CM | POA: Diagnosis not present

## 2019-06-29 DIAGNOSIS — M961 Postlaminectomy syndrome, not elsewhere classified: Secondary | ICD-10-CM | POA: Diagnosis not present

## 2019-07-01 DIAGNOSIS — G894 Chronic pain syndrome: Secondary | ICD-10-CM | POA: Diagnosis not present

## 2019-07-01 DIAGNOSIS — M533 Sacrococcygeal disorders, not elsewhere classified: Secondary | ICD-10-CM | POA: Diagnosis not present

## 2019-07-01 DIAGNOSIS — M5136 Other intervertebral disc degeneration, lumbar region: Secondary | ICD-10-CM | POA: Diagnosis not present

## 2019-07-01 DIAGNOSIS — Z5181 Encounter for therapeutic drug level monitoring: Secondary | ICD-10-CM | POA: Diagnosis not present

## 2019-07-01 DIAGNOSIS — M961 Postlaminectomy syndrome, not elsewhere classified: Secondary | ICD-10-CM | POA: Diagnosis not present

## 2019-07-01 DIAGNOSIS — Z79899 Other long term (current) drug therapy: Secondary | ICD-10-CM | POA: Diagnosis not present

## 2019-07-20 DIAGNOSIS — M533 Sacrococcygeal disorders, not elsewhere classified: Secondary | ICD-10-CM | POA: Diagnosis not present

## 2019-07-26 ENCOUNTER — Ambulatory Visit (INDEPENDENT_AMBULATORY_CARE_PROVIDER_SITE_OTHER): Payer: PPO | Admitting: Urology

## 2019-07-26 DIAGNOSIS — R351 Nocturia: Secondary | ICD-10-CM

## 2019-07-26 DIAGNOSIS — N401 Enlarged prostate with lower urinary tract symptoms: Secondary | ICD-10-CM | POA: Diagnosis not present

## 2019-08-31 DIAGNOSIS — M5136 Other intervertebral disc degeneration, lumbar region: Secondary | ICD-10-CM | POA: Diagnosis not present

## 2019-08-31 DIAGNOSIS — M961 Postlaminectomy syndrome, not elsewhere classified: Secondary | ICD-10-CM | POA: Diagnosis not present

## 2019-08-31 DIAGNOSIS — Z79899 Other long term (current) drug therapy: Secondary | ICD-10-CM | POA: Diagnosis not present

## 2019-09-06 ENCOUNTER — Ambulatory Visit: Payer: PPO | Admitting: Urology

## 2019-09-28 ENCOUNTER — Encounter: Payer: Self-pay | Admitting: Internal Medicine

## 2019-09-28 ENCOUNTER — Ambulatory Visit (INDEPENDENT_AMBULATORY_CARE_PROVIDER_SITE_OTHER): Payer: PPO | Admitting: Internal Medicine

## 2019-09-28 ENCOUNTER — Other Ambulatory Visit: Payer: Self-pay

## 2019-09-28 VITALS — BP 146/88 | HR 66 | Temp 97.5°F | Ht 70.0 in | Wt 170.0 lb

## 2019-09-28 DIAGNOSIS — H60391 Other infective otitis externa, right ear: Secondary | ICD-10-CM | POA: Diagnosis not present

## 2019-09-28 DIAGNOSIS — Z23 Encounter for immunization: Secondary | ICD-10-CM | POA: Diagnosis not present

## 2019-09-28 MED ORDER — MONTELUKAST SODIUM 10 MG PO TABS: 10.0000 mg | ORAL_TABLET | Freq: Every day | ORAL | 3 refills | Status: DC

## 2019-09-28 MED ORDER — NEOMYCIN-POLYMYXIN-HC 3.5-10000-1 OT SUSP: 3.0000 [drp] | Freq: Three times a day (TID) | OTIC | 0 refills | Status: DC

## 2019-09-28 NOTE — Patient Instructions (Signed)
We have sent in singulair to take 1 pill daily to help the drainage.   We have sent in ear drops to use for the pain. Use 3 drops 3 times a day for 3-5 days and you can use it externally for the rash on the ear.

## 2019-09-28 NOTE — Progress Notes (Signed)
   Subjective:   Patient ID: [name] [name], male    DOB: 1941-01-22, 78 y.o.   MRN: [id number]  HPI The patient is a 78 YO man coming in for left ear pain and sinus congestion. Started months ago with the sinus congestion and he is not taking any allergy medications otc or flonase. Denies fevers or chills. Denies cough or SOB. Has clear drainage nasal especially with eating and in the morning/overnight. The left and right ear are hurting in the last week or so. Off and on with sharp pain. Denies hearing loss or tinnitus new. Denies sinus headache or pain. Still smoking and no intention to quit right now. Overall it is worsening. Has tried nothing.  Review of Systems  Constitutional: Negative.   HENT: Positive for congestion, ear pain, postnasal drip and rhinorrhea. Negative for hearing loss, nosebleeds, sinus pressure, sinus pain, sneezing, sore throat, tinnitus, trouble swallowing and voice change.   Eyes: Negative.   Respiratory: Negative for cough, chest tightness and shortness of breath.   Cardiovascular: Negative for chest pain, palpitations and leg swelling.  Gastrointestinal: Negative for abdominal distention, abdominal pain, constipation, diarrhea, nausea and vomiting.  Musculoskeletal: Negative.   Skin: Negative.   Neurological: Negative.   Psychiatric/Behavioral: Negative.     Objective:  Physical Exam Constitutional:      Appearance: He is well-developed.  HENT:     Head: Normocephalic and atraumatic.     Right Ear: Tympanic membrane normal.     Left Ear: Tympanic membrane normal.     Ears:     Comments: Otitis externa bilateral ears, oropharynx with redness and clear drainage Neck:     Musculoskeletal: Normal range of motion.  Cardiovascular:     Rate and Rhythm: Normal rate and regular rhythm.  Pulmonary:     Effort: Pulmonary effort is normal. No respiratory distress.     Breath sounds: Normal breath sounds. No wheezing or rales.  Abdominal:     General: Bowel  sounds are normal. There is no distension.     Palpations: Abdomen is soft.     Tenderness: There is no abdominal tenderness. There is no rebound.  Skin:    General: Skin is warm and dry.  Neurological:     Mental Status: He is alert and oriented to person, place, and time.     Coordination: Coordination normal.     Vitals:   09/28/19 1042  BP: (!) 146/88  Pulse: 66  Temp: (!) 97.5 F (36.4 C)  TempSrc: Oral  SpO2: 99%  Weight: 170 lb (77.1 kg)  Height: 5\' 10"  (1.778 m)    Assessment & Plan:  Flu shot given at visit

## 2019-09-29 NOTE — Assessment & Plan Note (Signed)
Rx cortisporin ear drops and rx singulair for the allergy symptoms.

## 2019-10-11 ENCOUNTER — Ambulatory Visit (INDEPENDENT_AMBULATORY_CARE_PROVIDER_SITE_OTHER): Payer: PPO | Admitting: Urology

## 2019-10-11 DIAGNOSIS — N401 Enlarged prostate with lower urinary tract symptoms: Secondary | ICD-10-CM | POA: Diagnosis not present

## 2019-10-11 DIAGNOSIS — R351 Nocturia: Secondary | ICD-10-CM

## 2019-11-09 ENCOUNTER — Encounter: Payer: Self-pay | Admitting: Cardiology

## 2019-11-09 ENCOUNTER — Other Ambulatory Visit: Payer: Self-pay

## 2019-11-09 ENCOUNTER — Ambulatory Visit: Payer: PPO | Admitting: Cardiology

## 2019-11-09 VITALS — BP 136/84 | HR 77 | Ht 70.0 in | Wt 172.0 lb

## 2019-11-09 DIAGNOSIS — I2510 Atherosclerotic heart disease of native coronary artery without angina pectoris: Secondary | ICD-10-CM

## 2019-11-09 DIAGNOSIS — E785 Hyperlipidemia, unspecified: Secondary | ICD-10-CM | POA: Diagnosis not present

## 2019-11-09 LAB — LIPID PANEL
Chol/HDL Ratio: 2.8 ratio (ref 0.0–5.0)
Cholesterol, Total: 131 mg/dL (ref 100–199)
HDL: 47 mg/dL (ref >39)
LDL Chol Calc (NIH): 73 mg/dL (ref 0–99)
Triglycerides: 49 mg/dL (ref 0–149)
VLDL Cholesterol Cal: 11 mg/dL (ref 5–40)

## 2019-11-09 NOTE — Progress Notes (Signed)
Cardiology Office Note    Date:  11/09/2019  ID:  [name], DOB January 13, 1941, MRN [id number] PCP:  [name] Koch, MD  Cardiologist:  [name] Dawley, MD   Chief Complaint: 6 months follow up  History of Present Illness:  [name] [name] is a 78 y.o. male with history of CAD s/p CABGx5V (2004), small AAA followed by [name] [name], carotid artery disease, tobacco abuse, thrombocytopenia/MGUS, HTN, HLD, RBBB/LPFB, hyperglycemia who presents for f/u of elevated blood pressure.  His last AAA duplex was in 2018, 3.9x3.9cm stable AAA and aneurysmal right common iliac artery. Carotid duplex in 10/2014 showed 1-39% stenosis, f/u recommended 2 years. He has not been back to see [name] [name] since 2018. Last echocardiogram in 2004 showed EF 55-60%, mild LVH, abnormal relaxation. Nuclear stress test in 2011 showed no ischemia. He was recently seen virtually by [name] [name] 05/09/19 for routine follow-up and had elevated blood pressure at that time. Lisinopril was increased to 20mg  daily. Last labs 05/11/19 showed K 3.7, glucose 111, Cr 0.74, normal CBC, 05/2018 LFTs wnl.  11/09/2019 - the patient denies any chest pain or SOB, no LE edema. He had mild orthostatic hypotension after increasing lisinopril to 40 mg po daily, but that has improved, no falls.    Past Medical History:  Diagnosis Date  . AAA (abdominal aortic aneurysm) ([place])   . Back pain    decompression belt bought off of TV  . CAD (coronary artery disease)   . Carotid artery disease ([place])    a. duplex 2015 - 1-39% bilaterally.  . Carotid bruit    November, 2013  . Cataract    bilateral - surgery to remove  . DDD (degenerative disc disease), lumbar    hands  . Dyslipidemia   . GERD (gastroesophageal reflux disease)   . History of kidney stones   . HTN (hypertension)   . Hx of CABG    2004  . MGUS (monoclonal gammopathy of unknown significance)   . Myocardial infarction ([place])   . 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 Spinal stenosis, lumbar    3-4  . Thrombocytopenia ([place])    [name] [name]    Past Surgical History:  Procedure Laterality Date  . BACK SURGERY    . COLONOSCOPY    . COLONOSCOPY WITH PROPOFOL N/A 07/13/2017   Procedure: COLONOSCOPY WITH PROPOFOL;  Surgeon: [name] Gunning, MD;  Location: [facility];  Service: Gastroenterology;  Laterality: N/A;  . CORONARY ARTERY BYPASS GRAFT  2004  . CYSTOSCOPY WITH INSERTION OF UROLIFT N/A 12/14/2018   Procedure: CYSTOSCOPY WITH INSERTION OF UROLIFT;  Surgeon: [name] Gallo, MD;  Location: [facility];  Service: Urology;  Laterality: N/A;  . DECOMPRESSIVE LUMBAR LAMINECTOMY LEVEL 1 N/A 09/04/2016   Procedure: DECOMPRESSION L3 - L4;  Surgeon: Melina Schools, MD;  Location: [facility];  Service: Orthopedics;  Laterality: N/A;  . EYE SURGERY  2013   bilateral cataract extraction w/ IOL (Dr. Celene [name]), retina surgery  . JOINT REPLACEMENT    . LUMBAR LAMINECTOMY/DECOMPRESSION MICRODISCECTOMY  09/04/2016   L3-4  . SPINAL CORD STIMULATOR INSERTION N/A 05/13/2019   Procedure: LUMBAR SPINAL CORD STIMULATOR INSERTION;  Surgeon: [name] Hakim, MD;  Location: [facility];  Service: Neurosurgery;  Laterality: N/A;  LUMBAR SPINAL CORD STIMULATOR INSERTION  . TOTAL HIP ARTHROPLASTY Right 10/2003    Current Medications: Current Meds  Medication Sig  . aspirin 81 MG tablet Take 1 tablet (81 mg total) by  mouth daily.  Marland Kitchen docusate sodium (COLACE) 50 MG capsule Take 150 mg by mouth at bedtime.  Marland Kitchen esomeprazole (NEXIUM) 20 MG capsule Take 20 mg by mouth daily at 12 noon.  . metoprolol tartrate (LOPRESSOR) 50 MG tablet Take 1 tablet (50 mg total) by mouth 2 (two) times daily.  . montelukast (SINGULAIR) 10 MG tablet Take 1 tablet (10 mg total) by mouth at bedtime.  . Multiple Vitamins-Minerals (CENTRUM SILVER ULTRA MENS PO) Take 1 tablet by mouth daily.   Marland Kitchen neomycin-polymyxin-hydrocortisone (CORTISPORIN) 3.5-10000-1 OTIC suspension Place 3 drops into  both ears 3 (three) times daily.  . nitroGLYCERIN (NITROSTAT) 0.4 MG SL tablet Place 1 tablet (0.4 mg total) under the tongue every 5 (five) minutes as needed for chest pain.  . simvastatin (ZOCOR) 40 MG tablet Take 1 tablet (40 mg total) by mouth at bedtime.  . traMADol (ULTRAM) 50 MG tablet tramadol 50 mg tablet  Take 2 tablets 4 times a day by oral route as needed.     Allergies:   Penicillins   Social History   Socioeconomic History  . Marital status: Married    Spouse name: Not on file  . Number of children: 2  . Years of education: 35  . Highest education level: Not on file  Occupational History  . Occupation: trucking     Fish farm manager: RETIRED  Social Needs  . Financial resource strain: Not hard at all  . Food insecurity    Worry: Never true    Inability: Never true  . Transportation needs    Medical: No    Non-medical: No  Tobacco Use  . Smoking status: Heavy Tobacco Smoker    Packs/day: 0.50    Years: 65.00    Pack years: 32.50    Types: Cigarettes  . Smokeless tobacco: Never Used  . Tobacco comment: smoking since age 39 yrs old  Substance and Sexual Activity  . Alcohol use: Not Currently    Comment: occasional beer  . Drug use: No  . Sexual activity: Not Currently  Lifestyle  . Physical activity    Days per week: 0 days    Minutes per session: 0 min  . Stress: Not at all  Relationships  . Social connections    Talks on phone: More than three times a week    Gets together: More than three times a week    Attends religious service: More than 4 times per year    Active member of club or organization: Yes    Attends meetings of clubs or organizations: More than 4 times per year    Relationship status: Married  Other Topics Concern  . Not on file  Social History Narrative   HSG, Married '62, 1 son-'70, 1 dtr - '68; 2 grandchildren. Work - Probation officer.   ACP - DNR, DNI, no artificial hydration or feeding, no heroic measures     Family History:   The patient's family history includes Arthritis in his brother; COPD in his mother; Cancer in his paternal grandfather; Heart disease in his brother and brother; Kidney disease in his mother; Stroke in his father.  ROS:   Please see the history of present illness.  All other systems are reviewed and otherwise negative.    PHYSICAL EXAM:   VS:  BP 136/84   Pulse 77   Ht 5\' 10"  (1.778 m)   Wt 172 lb (78 kg)   SpO2 97%   BMI 24.68 kg/m   BMI: Body mass index  is 24.68 kg/m. GEN: Well nourished, well developed WM, in no acute distress HEENT: normocephalic, atraumatic Neck: no JVD, carotid bruits, or masses Cardiac: RRR; no murmurs, rubs, or gallops, no edema  Respiratory:  Coarse BS throughout, no wheezing or rales, normal work of breathing GI: soft, nontender, nondistended, + BS MS: no deformity or atrophy Skin: warm and dry, no rash Neuro:  Alert and Oriented x 3, Strength and sensation are intact, follows commands Psych: euthymic mood, full affect  Wt Readings from Last 3 Encounters:  11/09/19 172 lb (78 kg)  09/28/19 170 lb (77.1 kg)  06/13/19 168 lb (76.2 kg)      Studies/Labs Reviewed:   EKG:  EKG was not ordered today.  Recent Labs: 06/06/2019: ALT 16; BUN 11; Creatinine 0.82; Hemoglobin 14.5; Platelet Count 217; Potassium 4.1; Sodium 140   Lipid Panel    Component Value Date/Time   CHOL 142 01/31/2016 0847   TRIG 77.0 01/31/2016 0847   HDL 48.50 01/31/2016 0847   CHOLHDL 3 01/31/2016 0847   VLDL 15.4 01/31/2016 0847   LDLCALC 78 01/31/2016 0847    Additional studies/ records that were reviewed today include: Summarized above   ASSESSMENT & PLAN:   1. Essential HTN - repeat BP today 124/70 mmHg, I would continue this regimen as higher might cause orthostativ hypotension. 2. CAD s/p CABG - asymptomatic, no angina. Continue ASA, BB, statin. He is disinterested in quitting smoking at this time despite counseling. Consider transition from simvastatin to  atorvastatin if LDL not at goal. We will check lipids today. 3. PAD with small AAA and carotid artery disease- followed by vasculare 4. Bifascicular block with RBBB, LPFB - asymptomatic, unchanged 5. Hyperlipidemia - lipids are pending as above.  Disposition: F/u with Dr. Meda Coffee 6 months.  Medication Adjustments/Labs and Tests Ordered: Current medicines are reviewed at length with the patient today.  Concerns regarding medicines are outlined above. Medication changes, Labs and Tests ordered today are summarized above and listed in the Patient Instructions accessible in Encounters.   Signed, [name] Dawley, MD  11/09/2019 10:26 AM    [facility] [address], [place], [place]  [address] Phone: [phone]; Fax: [phone]

## 2019-11-09 NOTE — Patient Instructions (Signed)
Medication Instructions:   Your physician recommends that you continue on your current medications as directed. Please refer to the Current Medication list given to you today.  *If you need a refill on your cardiac medications before your next appointment, please call your pharmacy*   Lab Work:  TODAY--LIPIDS  If you have labs (blood work) drawn today and your tests are completely normal, you will receive your results only by: Marland Kitchen MyChart Message (if you have MyChart) OR . A paper copy in the mail If you have any lab test that is abnormal or we need to change your treatment, we will call you to review the results.   Follow-Up: At [facility], you and your health needs are our priority.  As part of our continuing mission to provide you with exceptional heart care, we have created designated Provider Care Teams.  These Care Teams include your primary Cardiologist (physician) and Advanced Practice Providers (APPs -  Physician Assistants and Nurse Practitioners) who all work together to provide you with the care you need, when you need it.  Your next appointment:   6 month(s)  The format for your next appointment:   Either In Person or Virtual  Provider:   [name], MD

## 2019-11-10 ENCOUNTER — Telehealth: Payer: Self-pay | Admitting: Internal Medicine

## 2019-11-10 NOTE — Telephone Encounter (Signed)
Can we see if patient can do a phone or virtual visit to discuss?

## 2019-11-10 NOTE — Telephone Encounter (Signed)
Spouse will speak with patient to see if he wants to be seen sooner by a different MD,.

## 2019-11-10 NOTE — Telephone Encounter (Signed)
Patient wife Gotham Lenning called and would like to talk to Dr. Nathanial Millman CMA about patients ear issue he came in for 2 weeks ago. She is not able to get an appt for him until Monday. Please call Mrs. Laymon back, thanks.

## 2019-11-14 ENCOUNTER — Other Ambulatory Visit: Payer: Self-pay

## 2019-11-14 ENCOUNTER — Ambulatory Visit (INDEPENDENT_AMBULATORY_CARE_PROVIDER_SITE_OTHER): Payer: PPO | Admitting: Internal Medicine

## 2019-11-14 ENCOUNTER — Encounter: Payer: Self-pay | Admitting: Internal Medicine

## 2019-11-14 VITALS — BP 140/90 | HR 72 | Temp 97.5°F | Ht 70.0 in | Wt 171.0 lb

## 2019-11-14 DIAGNOSIS — L0291 Cutaneous abscess, unspecified: Secondary | ICD-10-CM | POA: Insufficient documentation

## 2019-11-14 DIAGNOSIS — R21 Rash and other nonspecific skin eruption: Secondary | ICD-10-CM | POA: Diagnosis not present

## 2019-11-14 MED ORDER — BETAMETHASONE VALERATE 0.1 % EX OINT: 1.0000 "application " | TOPICAL_OINTMENT | Freq: Two times a day (BID) | CUTANEOUS | 2 refills | Status: DC

## 2019-11-14 NOTE — Assessment & Plan Note (Signed)
Rx betamethasone ointment to use externally. Has visit with dermatology later this month if no resolution will go to that.

## 2019-11-14 NOTE — Progress Notes (Signed)
   Subjective:   Patient ID: [name] [name], male    DOB: [birth year], 78 y.o.   MRN: [id number]  HPI The patient is a 78 YO man coming in for rash on the skin around both ears. Started a long time ago (potentially years) with worsening redness and itching in the last several months. Tried cortisporin ear drops about 1 month or so ago and this did not help but did cause loss of hearing for several hours with the drops. Rare itching in the ear canal. Denies ear pain or sinus congestion. Denies fevers or chills.   Review of Systems  Constitutional: Negative.   HENT: Negative.   Eyes: Negative.   Respiratory: Negative for cough, chest tightness and shortness of breath.   Cardiovascular: Negative for chest pain, palpitations and leg swelling.  Gastrointestinal: Negative for abdominal distention, abdominal pain, constipation, diarrhea, nausea and vomiting.  Musculoskeletal: Negative.   Skin: Positive for rash.       itching  Neurological: Negative.   Psychiatric/Behavioral: Negative.     Objective:  Physical Exam Constitutional:      Appearance: He is well-developed.  HENT:     Head: Normocephalic and atraumatic.     Comments: Some flaking and redness in ear canals, TMs normal bilateral Neck:     Musculoskeletal: Normal range of motion.  Cardiovascular:     Rate and Rhythm: Normal rate.  Pulmonary:     Effort: Pulmonary effort is normal. No respiratory distress.     Breath sounds: Normal breath sounds. No wheezing or rales.  Abdominal:     General: Bowel sounds are normal. There is no distension.     Palpations: Abdomen is soft.     Tenderness: There is no abdominal tenderness. There is no rebound.  Skin:    General: Skin is warm and dry.     Comments: Rash with redness and flaking around both ears  Neurological:     Mental Status: He is alert and oriented to person, place, and time.     Coordination: Coordination normal.     Vitals:   11/14/19 1418  BP: 140/90  Pulse:  72  Temp: (!) 97.5 F (36.4 C)  TempSrc: Oral  SpO2: 98%  Weight: 171 lb (77.6 kg)  Height: 5\' 10"  (1.778 m)    This visit occurred during the SARS-CoV-2 public health emergency.  Safety protocols were in place, including screening questions prior to the visit, additional usage of staff PPE, and extensive cleaning of exam room while observing appropriate contact time as indicated for disinfecting solutions.   Assessment & Plan:

## 2019-11-14 NOTE — Patient Instructions (Signed)
We have sent in betamethasone to use twice a day on the ear. You should notice some improvement in 1-2 weeks.

## 2019-12-12 ENCOUNTER — Other Ambulatory Visit: Payer: Self-pay | Admitting: Internal Medicine

## 2019-12-30 DIAGNOSIS — M533 Sacrococcygeal disorders, not elsewhere classified: Secondary | ICD-10-CM | POA: Diagnosis not present

## 2019-12-30 DIAGNOSIS — G894 Chronic pain syndrome: Secondary | ICD-10-CM | POA: Diagnosis not present

## 2019-12-30 DIAGNOSIS — Z79899 Other long term (current) drug therapy: Secondary | ICD-10-CM | POA: Diagnosis not present

## 2019-12-30 DIAGNOSIS — M5136 Other intervertebral disc degeneration, lumbar region: Secondary | ICD-10-CM | POA: Diagnosis not present

## 2019-12-30 DIAGNOSIS — M961 Postlaminectomy syndrome, not elsewhere classified: Secondary | ICD-10-CM | POA: Diagnosis not present

## 2020-01-01 ENCOUNTER — Ambulatory Visit: Payer: PPO

## 2020-01-03 ENCOUNTER — Encounter: Payer: Self-pay | Admitting: Internal Medicine

## 2020-01-09 DIAGNOSIS — H52203 Unspecified astigmatism, bilateral: Secondary | ICD-10-CM | POA: Diagnosis not present

## 2020-01-09 DIAGNOSIS — Z961 Presence of intraocular lens: Secondary | ICD-10-CM | POA: Diagnosis not present

## 2020-01-09 DIAGNOSIS — H11002 Unspecified pterygium of left eye: Secondary | ICD-10-CM | POA: Diagnosis not present

## 2020-01-14 ENCOUNTER — Ambulatory Visit: Payer: PPO | Attending: Internal Medicine

## 2020-01-14 DIAGNOSIS — Z23 Encounter for immunization: Secondary | ICD-10-CM | POA: Insufficient documentation

## 2020-01-14 NOTE — Progress Notes (Signed)
   Covid-19 Vaccination Clinic  Name:  [name] [name]    MRN: [id number] DOB: 10/05/1941  01/14/2020  [name] was observed post Covid-19 immunization for 30 minutes without incidence. He was provided with Vaccine Information Sheet and instruction to access the V-Safe system.   [name] was instructed to call 911 with any severe reactions post vaccine: Marland Kitchen Difficulty breathing  . Swelling of your face and throat  . A fast heartbeat  . A bad rash all over your body  . Dizziness and weakness    Immunizations Administered    Name Date Dose VIS Date Route   Pfizer COVID-19 Vaccine 01/14/2020  2:01 PM 0.3 mL 11/18/2019 Intramuscular   Manufacturer: [place]   Lot: [id number]   [place]: [id number]

## 2020-01-25 ENCOUNTER — Other Ambulatory Visit: Payer: Self-pay

## 2020-01-25 DIAGNOSIS — N401 Enlarged prostate with lower urinary tract symptoms: Secondary | ICD-10-CM

## 2020-01-25 MED ORDER — TAMSULOSIN HCL 0.4 MG PO CAPS: 0.4000 mg | ORAL_CAPSULE | Freq: Every day | ORAL | 11 refills | Status: DC

## 2020-01-25 NOTE — Progress Notes (Signed)
Pt called for refill for tamsulosin. Refill sent in

## 2020-01-27 ENCOUNTER — Telehealth (HOSPITAL_COMMUNITY): Payer: Self-pay

## 2020-01-27 NOTE — Telephone Encounter (Signed)

## 2020-01-30 ENCOUNTER — Ambulatory Visit (HOSPITAL_COMMUNITY)
Admission: RE | Admit: 2020-01-30 | Discharge: 2020-01-30 | Disposition: A | Discharge status: Home / Self Care | Payer: PPO | Source: Ambulatory Visit | Attending: Surgery | Admitting: Surgery

## 2020-01-30 ENCOUNTER — Other Ambulatory Visit: Payer: Self-pay

## 2020-01-30 ENCOUNTER — Ambulatory Visit (INDEPENDENT_AMBULATORY_CARE_PROVIDER_SITE_OTHER): Payer: PPO | Admitting: Physician Assistant

## 2020-01-30 VITALS — BP 141/82 | HR 64 | Temp 97.3°F | Resp 16 | Ht 70.5 in | Wt 168.0 lb

## 2020-01-30 DIAGNOSIS — I714 Abdominal aortic aneurysm, without rupture, unspecified: Secondary | ICD-10-CM

## 2020-01-30 NOTE — Progress Notes (Signed)
Office Note     CC:  follow up Requesting Provider:  Hoyt Koch, *  HPI: [name] [name] is a 79 y.o. ([birth year]) male who presents for follow up of abdominal aortic aneurysm. His aneurysm was initially found incidentally on evaluation of degenerative disc disease and has been being followed by surveillance ultrasound since  At the time of last follow up in July 2020 he was doing well without any new symptoms related to his aneurysm.  Today he denies any new back pain, no abdominal pain, nausea, vomiting, changes in his bowels or trouble urinating. He does have chronic constipation secondary to chronic pain medications. He denies any lower extremity weakness or claudication symptoms. He does not have any rest pain or problems with wound healing  He has hx of multiple back procedures. He has a nerve stimulator implant in back and is also goes to pain management for his back  He usually goes to the pool to swim 3x/week but because of COVID has had to cut back so he has not been as active the last couple months as usual. He continues to use cane to ambulate  The pt on a statin for cholesterol management.  The pt on a daily aspirin.   Other AC:  none The pt  on metoprolol  And lisinopril for hypertension.   The pt is not diabetic.  Tobacco hx:  Current smoker, 1/2 ppd  Past Medical History:  Diagnosis Date  . AAA (abdominal aortic aneurysm) ([place])   . Back pain    decompression belt bought off of TV  . CAD (coronary artery disease)   . Carotid artery disease ([place])    a. duplex 2015 - 1-39% bilaterally.  . Carotid bruit    November, 2013  . Cataract    bilateral - surgery to remove  . DDD (degenerative disc disease), lumbar    hands  . Dyslipidemia   . GERD (gastroesophageal reflux disease)   . History of kidney stones   . HTN (hypertension)   . Hx of CABG    2004  . MGUS (monoclonal gammopathy of unknown significance)   . Myocardial infarction ([place])   . 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 Spinal stenosis, lumbar    3-4  . Thrombocytopenia ([place])    Dr. Ralene Ok    Past Surgical History:  Procedure Laterality Date  . BACK SURGERY    . COLONOSCOPY    . COLONOSCOPY WITH PROPOFOL N/A 07/13/2017   Procedure: COLONOSCOPY WITH PROPOFOL;  Surgeon: Manus Gunning, MD;  [facility];  Service: Gastroenterology;  Laterality: N/A;  . CORONARY ARTERY BYPASS GRAFT  2004  . CYSTOSCOPY WITH INSERTION OF UROLIFT N/A 12/14/2018   Procedure: CYSTOSCOPY WITH INSERTION OF UROLIFT;  Surgeon: Franchot Gallo, MD;  [facility];  Service: Urology;  Laterality: N/A;  . DECOMPRESSIVE LUMBAR LAMINECTOMY LEVEL 1 N/A 09/04/2016   Procedure: DECOMPRESSION L3 - L4;  Surgeon: Melina Schools, MD;  Location: [facility];  Service: Orthopedics;  Laterality: N/A;  . EYE SURGERY  2013   bilateral cataract extraction w/ IOL (Dr. Celene Squibb), retina surgery  . JOINT REPLACEMENT    . LUMBAR LAMINECTOMY/DECOMPRESSION MICRODISCECTOMY  09/04/2016   L3-4  . SPINAL CORD STIMULATOR INSERTION N/A 05/13/2019   Procedure: LUMBAR SPINAL CORD STIMULATOR INSERTION;  Surgeon: Clydell Hakim, MD;  Location: [facility];  Service: Neurosurgery;  Laterality: N/A;  LUMBAR SPINAL CORD STIMULATOR INSERTION  . TOTAL HIP ARTHROPLASTY  Right 10/2003    Social History   Socioeconomic History  . Marital status: Married    Spouse name: Not on file  . Number of children: 2  . Years of education: 68  . Highest education level: Not on file  Occupational History  . Occupation: trucking     Fish farm manager: RETIRED  Tobacco Use  . Smoking status: Heavy Tobacco Smoker    Packs/day: 0.50    Years: 65.00    Pack years: 32.50    Types: Cigarettes  . Smokeless tobacco: Never Used  . Tobacco comment: smoking since age 32 yrs old  Substance and Sexual Activity  . Alcohol use: Not Currently    Comment: occasional beer  . Drug use: No  . Sexual activity: Not Currently    Other Topics Concern  . Not on file  Social History Narrative   HSG, Married '62, 1 son-'70, 1 dtr - '68; 2 grandchildren. Work - Probation officer.   ACP - DNR, DNI, no artificial hydration or feeding, no heroic measures   Social Determinants of Health   Financial Resource Strain:   . Difficulty of Paying Living Expenses: Not on file  Food Insecurity:   . Worried About Charity fundraiser in the Last Year: Not on file  . Ran Out of Food in the Last Year: Not on file  Transportation Needs:   . Lack of Transportation (Medical): Not on file  . Lack of Transportation (Non-Medical): Not on file  Physical Activity: Inactive  . Days of Exercise per Week: 0 days  . Minutes of Exercise per Session: 0 min  Stress:   . Feeling of Stress : Not on file  Social Connections:   . Frequency of Communication with Friends and Family: Not on file  . Frequency of Social Gatherings with Friends and Family: Not on file  . Attends Religious Services: Not on file  . Active Member of Clubs or Organizations: Not on file  . Attends Archivist Meetings: Not on file  . Marital Status: Not on file  Intimate Partner Violence:   . Fear of Current or Ex-Partner: Not on file  . Emotionally Abused: Not on file  . Physically Abused: Not on file  . Sexually Abused: Not on file    Family History  Problem Relation Age of Onset  . Kidney disease Mother   . COPD Mother   . Stroke Father        cerebral hemorrhage  . Heart disease Brother   . Heart disease Brother   . Arthritis Brother   . Cancer Paternal Grandfather     Current Outpatient Medications  Medication Sig Dispense Refill  . aspirin 81 MG tablet Take 1 tablet (81 mg total) by mouth daily. 90 tablet 3  . betamethasone valerate ointment (VALISONE) 0.1 % Apply 1 application topically 2 (two) times daily. 452 g 2  . docusate sodium (COLACE) 50 MG capsule Take 150 mg by mouth at bedtime.    Marland Kitchen esomeprazole (NEXIUM) 20 MG capsule Take  20 mg by mouth daily at 12 noon.    . metoprolol tartrate (LOPRESSOR) 50 MG tablet Take 1 tablet (50 mg total) by mouth 2 (two) times daily. 180 tablet 3  . montelukast (SINGULAIR) 10 MG tablet TAKE 1 TABLET BY MOUTH EVERYDAY AT BEDTIME 90 tablet 1  . Multiple Vitamins-Minerals (CENTRUM SILVER ULTRA MENS PO) Take 1 tablet by mouth daily.     . nitroGLYCERIN (NITROSTAT) 0.4 MG SL tablet Place 1  tablet (0.4 mg total) under the tongue every 5 (five) minutes as needed for chest pain. 25 tablet 3  . simvastatin (ZOCOR) 40 MG tablet Take 1 tablet (40 mg total) by mouth at bedtime. 90 tablet 3  . tamsulosin (FLOMAX) 0.4 MG CAPS capsule Take 1 capsule (0.4 mg total) by mouth daily. 30 capsule 11  . traMADol (ULTRAM) 50 MG tablet tramadol 50 mg tablet  Take 2 tablets 4 times a day by oral route as needed.    Marland Kitchen lisinopril (ZESTRIL) 40 MG tablet Take 1 tablet (40 mg total) by mouth daily. 90 tablet 3   No current facility-administered medications for this visit.    Allergies  Allergen Reactions  . Penicillins Other (See Comments)    PATIENT HAS HAD A PCN REACTION WITH IMMEDIATE RASH, FACIAL/TONGUE/THROAT SWELLING, SOB, OR LIGHTHEADEDNESS WITH HYPOTENSION:   #  YES  #    Has patient had a PCN reaction causing severe rash involving mucus membranes or skin necrosis: No Has patient had a PCN reaction that required hospitalization No Has patient had a PCN reaction occurring within the last 10 years: No If all of the above answers are "NO", then may proceed with Cephalosporin use.  Passed out     REVIEW OF SYSTEMS:  He has chronic low back pain  [X]  denotes positive finding, [ ]  denotes negative finding Cardiac  Comments:  Chest pain or chest pressure:    Shortness of breath upon exertion:    Short of breath when lying flat:    Irregular heart rhythm:        Vascular    Pain in calf, thigh, or hip brought on by ambulation:    Pain in feet at night that wakes you up from your sleep:     Blood  clot in your veins:    Leg swelling:         Pulmonary    Oxygen at home:    Productive cough:     Wheezing:         Neurologic    Sudden weakness in arms or legs:     Sudden numbness in arms or legs:     Sudden onset of difficulty speaking or slurred speech:    Temporary loss of vision in one eye:     Problems with dizziness:         Gastrointestinal    Blood in stool:     Vomited blood:         Genitourinary    Burning when urinating:     Blood in urine:        Psychiatric    Major depression:         Hematologic    Bleeding problems:    Problems with blood clotting too easily:        Skin    Rashes or ulcers:        Constitutional    Fever or chills:      PHYSICAL EXAMINATION:  Vitals:   01/30/20 0958  BP: (!) 141/82  Pulse: 64  Resp: 16  Temp: (!) 97.3 F (36.3 C)  TempSrc: Temporal  SpO2: 98%  Weight: 168 lb (76.2 kg)  Height: 5' 10.5" (1.791 m)    General:  WDWN in NAD; vital signs documented above Gait: Not observed HENT: WNL, normocephalic Pulmonary: normal non-labored breathing , without Rales, rhonchi,  wheezing Cardiac: regular HR, without  Murmurs without carotid bruit Abdomen: soft, non tender, normal bowel sounds, no  palpable AAA Skin: without rashes Vascular Exam/Pulses:  Right Left  Radial 2+ (normal) 2+ (normal)  Ulnar 1+ (weak) 1+ (weak)  Femoral 2+ (normal) 2+ (normal)  Popliteal 2+ (normal) 2+ (normal)  DP 1+ ( weak) 2+ (normal)  PT 1+ (weak) 1+ (weak)   Extremities: without ischemic changes, without Gangrene , without cellulitis; without open wounds;  Musculoskeletal: no muscle wasting or atrophy  Neurologic: A&O X 3;  No focal weakness or paresthesias are detected Psychiatric:  The pt has Normal affect.   Non-Invasive Vascular Imaging:   01/30/20: AAA duplex   Abdominal Aorta Findings:  +-----------+-------+----------+----------+--------+--------+--------+  Location  AP (cm)Trans (cm)PSV  (cm/s)WaveformThrombusComments  +-----------+-------+----------+----------+--------+--------+--------+  Proximal  2.50  2.55   55    biphasic          +-----------+-------+----------+----------+--------+--------+--------+  Mid    2.00  2.33   65    biphasic          +-----------+-------+----------+----------+--------+--------+--------+  Distal   4.56  4.56   128    biphasic          +-----------+-------+----------+----------+--------+--------+--------+  RT CIA Prox2.0  2.0    81    biphasic          +-----------+-------+----------+----------+--------+--------+--------+  LT CIA Prox1.6  1.9    48    biphasic          +-----------+-------+----------+----------+--------+--------+--------+   Abdominal aorta: the largest aortic diameter remains essentially unchanged compared to prior exam. Previous diameter measurement was 4.53 x 4.45 cm obtained on 06/13/19    ASSESSMENT/PLAN:: 79 y.o. male here for follow up for follow up of asymptomatic abdominal aortic aneurysm. His AAA is essentially unchanged from prior ultrasound. It has only very slightly increased from last ultrasound in 06/2019  - AAA repair if he becomes symptomatic, size > 5.5 cm or > 1 cm growth in 1 year - Discussed importance of strict blood pressure control, smoking cessation, regular exercise - Advised him to go to the emergency room should he experience sudden onset of abdominal pain or back pain - He will continue his aspirin and zocor - He will follow up in 6 months with repeat AAA duplex   Karoline Caldwell, PA-C [facility] [phone]  Clinic MD:   Dr. Trula Slade

## 2020-01-31 ENCOUNTER — Other Ambulatory Visit: Payer: Self-pay | Admitting: *Deleted

## 2020-01-31 DIAGNOSIS — I714 Abdominal aortic aneurysm, without rupture, unspecified: Secondary | ICD-10-CM

## 2020-02-07 ENCOUNTER — Other Ambulatory Visit: Payer: Self-pay

## 2020-02-07 ENCOUNTER — Encounter: Payer: Self-pay | Admitting: Urology

## 2020-02-07 ENCOUNTER — Ambulatory Visit (INDEPENDENT_AMBULATORY_CARE_PROVIDER_SITE_OTHER): Payer: PPO | Admitting: Urology

## 2020-02-07 DIAGNOSIS — N401 Enlarged prostate with lower urinary tract symptoms: Secondary | ICD-10-CM | POA: Diagnosis not present

## 2020-02-07 LAB — POCT URINALYSIS DIPSTICK
Bilirubin, UA: NEGATIVE
Blood, UA: NEGATIVE
Glucose, UA: NEGATIVE
Ketones, UA: NEGATIVE
Leukocytes, UA: NEGATIVE
Nitrite, UA: NEGATIVE
Protein, UA: POSITIVE — AB
Spec Grav, UA: <=1.005 — AB (ref 1.010–1.025)
Urobilinogen, UA: 0.2 E.U./dL
pH, UA: 7 (ref 5.0–8.0)

## 2020-02-07 LAB — BLADDER SCAN AMB NON-IMAGING: Scan Result: 176.4

## 2020-02-07 NOTE — Progress Notes (Signed)

## 2020-02-07 NOTE — Progress Notes (Signed)
H&P  Chief Complaint: BPH  History of Present Illness:   3.2.2021: Here today for follow-up. He reports that he has been having good weeks and bad weeks with regards to his urinary sx's. He still is c/o a sense of incomplete emptying. He is still on 1x tamsulosin and is interested in increasing his dosage. Retrospectively, he is not too sure his urolift was all that effective for him. Of note to him, his sx's do seem to improve somewhat after a BM -- he is on a bowel regimen of high fiber intake.   He continues to use spinal stimulator and daily tramadol for his back pain.   IPSS Questionnaire (AUA-7): Over the past month.   1)  How often have you had a sensation of not emptying your bladder completely after you finish urinating?  3 - About half the time  2)  How often have you had to urinate again less than two hours after you finished urinating? 2 - Less than half the time  3)  How often have you found you stopped and started again several times when you urinated?  4 - More than half the time  4) How difficult have you found it to postpone urination?  3 - About half the time  5) How often have you had a weak urinary stream?  4 - More than half the time  6) How often have you had to push or strain to begin urination?  3 - About half the time  7) How many times did you most typically get up to urinate from the time you went to bed until the time you got up in the morning?  3 - 3 times  Total score:  0-7 mildly symptomatic   8-19 moderately symptomatic   20-35 severely symptomatic   Total: 22 QoL: 4  (below copied from [facility] records):  BPH:  [name] [name] is a 79 year-old male established patient who is here for an enlarged prostate follow-up evaluation.  He is currently taking tamsulosin.   9.10.19--Seen for worsening lower urinary tract symptoms despite being on dual medical therapy. Residual urine volume was 195 mL.   11.26.2019: He is here for cystoscopy, flow rate. Recent  prostate ultrasound revealed volume of 33.4 mL. Mild intravesical protrusion of prostate but no definitive median lobe.   1.7.2020: Urolift--6 implants placed.   2.25.2020: He is here today for follow-up of his Urolift. He is now off of both tamsulosin capsules. He has had no recent difficulty. He did have dysuria for 4 days. IPSS 15. He is only waking up once a night to urinate. Biggest issue is a slow stream but this is not bothersome.   8.18.2020: He returns for follow-up re his Urolift. He is now very unsatisfied with his procedure. He states that he has no urinary pressure and must sit there for at least 5 minutes urinating. He also states that he wakes up every 45 mins - 1.5 hours during the night to urinate. He does not feel like he is emptying out well at all. This is most troublesome during the night, in the day he only urinates every 2-3 hours. Restarted on tamsulosin.   11.3.2020: Here today for follow-up. He states that since last visit he has had improvements to urinary stream and sx's. He was scheduled for possible cysto but he states he has had enough improvement w/ 1x tamsulosin that he does want further evaluation at this point. He is hesitant about being on more  medication than he already is.   Back Pain:  11.3.2020: He is having severe chronic back pain. He is taking 8x tramadol daily to manage this. He also now wears a back support belt all day to help manage pain. He has had some constipation w/ pain meds.   Past Medical History:  Diagnosis Date  . AAA (abdominal aortic aneurysm) ([place])   . Back pain    decompression belt bought off of TV  . CAD (coronary artery disease)   . Carotid artery disease ([place])    a. duplex 2015 - 1-39% bilaterally.  . Carotid bruit    November, 2013  . Cataract    bilateral - surgery to remove  . DDD (degenerative disc disease), lumbar    hands  . Dyslipidemia   . GERD (gastroesophageal reflux disease)   . History of kidney stones   . HTN  (hypertension)   . Hx of CABG    2004  . MGUS (monoclonal gammopathy of unknown significance)   . Myocardial infarction ([place])   . 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 Spinal stenosis, lumbar    3-4  . Thrombocytopenia ([place])    Dr. Ralene Ok    Past Surgical History:  Procedure Laterality Date  . BACK SURGERY    . COLONOSCOPY    . COLONOSCOPY WITH PROPOFOL N/A 07/13/2017   Procedure: COLONOSCOPY WITH PROPOFOL;  Surgeon: Manus Gunning, MD;  [facility];  Service: Gastroenterology;  Laterality: N/A;  . CORONARY ARTERY BYPASS GRAFT  2004  . CYSTOSCOPY WITH INSERTION OF UROLIFT N/A 12/14/2018   Procedure: CYSTOSCOPY WITH INSERTION OF UROLIFT;  Surgeon: Franchot Gallo, MD;  Location[facility];  Service: Urology;  Laterality: N/A;  . DECOMPRESSIVE LUMBAR LAMINECTOMY LEVEL 1 N/A 09/04/2016   Procedure: DECOMPRESSION L3 - L4;  Surgeon: Melina Schools, MD;  Location: [facility];  Service: Orthopedics;  Laterality: N/A;  . EYE SURGERY  2013   bilateral cataract extraction w/ IOL (Dr. Celene Squibb), retina surgery  . JOINT REPLACEMENT    . LUMBAR LAMINECTOMY/DECOMPRESSION MICRODISCECTOMY  09/04/2016   L3-4  . SPINAL CORD STIMULATOR INSERTION N/A 05/13/2019   Procedure: LUMBAR SPINAL CORD STIMULATOR INSERTION;  Surgeon: Clydell Hakim, MD;  Location: [facility];  Service: Neurosurgery;  Laterality: N/A;  LUMBAR SPINAL CORD STIMULATOR INSERTION  . TOTAL HIP ARTHROPLASTY Right 10/2003    Home Medications:  Allergies as of 02/07/2020      Reactions   Penicillins Other (See Comments)   PATIENT HAS HAD A PCN REACTION WITH IMMEDIATE RASH, FACIAL/TONGUE/THROAT SWELLING, SOB, OR LIGHTHEADEDNESS WITH HYPOTENSION:   #  YES  #    Has patient had a PCN reaction causing severe rash involving mucus membranes or skin necrosis: No Has patient had a PCN reaction that required hospitalization No Has patient had a PCN reaction occurring within the last 10 years: No If all of  the above answers are "NO", then may proceed with Cephalosporin use. Passed out      Medication List       Accurate as of February 07, 2020  3:17 PM. If you have any questions, ask your nurse or doctor.        aspirin 81 MG tablet Take 1 tablet (81 mg total) by mouth daily.   betamethasone valerate ointment 0.1 % Commonly known as: VALISONE Apply 1 application topically 2 (two) times daily.   CENTRUM SILVER ULTRA MENS PO Take 1 tablet by mouth daily.  docusate sodium 50 MG capsule Commonly known as: COLACE Take 150 mg by mouth at bedtime.   esomeprazole 20 MG capsule Commonly known as: NEXIUM Take 20 mg by mouth daily at 12 noon.   lisinopril 40 MG tablet Commonly known as: ZESTRIL Take 1 tablet (40 mg total) by mouth daily.   metoprolol tartrate 50 MG tablet Commonly known as: LOPRESSOR Take 1 tablet (50 mg total) by mouth 2 (two) times daily.   montelukast 10 MG tablet Commonly known as: SINGULAIR TAKE 1 TABLET BY MOUTH EVERYDAY AT BEDTIME   nitroGLYCERIN 0.4 MG SL tablet Commonly known as: NITROSTAT Place 1 tablet (0.4 mg total) under the tongue every 5 (five) minutes as needed for chest pain.   simvastatin 40 MG tablet Commonly known as: ZOCOR Take 1 tablet (40 mg total) by mouth at bedtime.   tamsulosin 0.4 MG Caps capsule Commonly known as: FLOMAX Take 1 capsule (0.4 mg total) by mouth daily.   traMADol 50 MG tablet Commonly known as: ULTRAM tramadol 50 mg tablet  Take 2 tablets 4 times a day by oral route as needed.       Allergies:  Allergies  Allergen Reactions  . Penicillins Other (See Comments)    PATIENT HAS HAD A PCN REACTION WITH IMMEDIATE RASH, FACIAL/TONGUE/THROAT SWELLING, SOB, OR LIGHTHEADEDNESS WITH HYPOTENSION:   #  YES  #    Has patient had a PCN reaction causing severe rash involving mucus membranes or skin necrosis: No Has patient had a PCN reaction that required hospitalization No Has patient had a PCN reaction occurring within  the last 10 years: No If all of the above answers are "NO", then may proceed with Cephalosporin use.  Passed out    Family History  Problem Relation Age of Onset  . Kidney disease Mother   . COPD Mother   . Stroke Father        cerebral hemorrhage  . Heart disease Brother   . Heart disease Brother   . Arthritis Brother   . Cancer Paternal Grandfather     Social History:  reports that he has been smoking cigarettes. He has a 32.50 pack-year smoking history. He has never used smokeless tobacco. He reports previous alcohol use. He reports that he does not use drugs.  ROS: A complete review of systems was performed.  All systems are negative except for pertinent findings as noted.  Physical Exam:  Vital signs in last 24 hours: BP (!) 159/78   Pulse 88   Temp (!) 97.3 F (36.3 C)   Ht 5\' 10"  (1.778 m)   Wt 170 lb (77.1 kg)   BMI 24.39 kg/m  Constitutional:  Alert and oriented, No acute distress Cardiovascular: Regular rate  Respiratory: Normal respiratory effort GI: Abdomen is soft, nontender, nondistended, no abdominal masses. No CVAT. Lymphatic: No lymphadenopathy Neurologic: Grossly intact, no focal deficits Psychiatric: Normal mood and affect  Laboratory Data:  No results for input(s): WBC, HGB, HCT, PLT in the last 72 hours.  No results for input(s): NA, K, CL, GLUCOSE, BUN, CALCIUM, CREATININE in the last 72 hours.  Invalid input(s): CO3   Results for orders placed or performed in visit on 02/07/20 (from the past 24 hour(s))  POCT urinalysis dipstick     Status: Abnormal   Collection Time: 02/07/20  2:29 PM  Result Value Ref Range   Color, UA yellow    Clarity, UA     Glucose, UA Negative Negative   Bilirubin, UA neg  Ketones, UA neg    Spec Grav, UA <=1.005 (A) 1.010 - 1.025   Blood, UA neg    pH, UA 7.0 5.0 - 8.0   Protein, UA Positive (A) Negative   Urobilinogen, UA 0.2 0.2 or 1.0 E.U./dL   Nitrite, UA neg    Leukocytes, UA Negative Negative    Appearance clear    Odor     No results found for this or any previous visit (from the past 240 hour(s)). I have reviewed prior pt notes  I have reviewed notes from referring/previous physicians  I have reviewed urinalysis results  I have reviewed prior PSA results   Impression/Assessment:  It is possible that his bladder fxn is affected by back issues and chronic pain medication usage, though he may also still have some obstruction. I explained that since his urolift, we have made a few changes to how these are done and are likely now more effective.  Plan:  1. He is fine to increase use of tamsulosin to 2x daily. He will call in 2-3 weeks to have his rx increased if this is effective.  2. I provided more information on urolift and the changes that have been made to our protocols to improve its efficacy. If desired, we may discuss a repeat urolift at a later visit.   3. He should continue with his bowel regimen -- I provided some additional recommendations. He should also make serious efforts to increase his fluid intake.   4. Return for check-in in 6 mo's. He is fine to delay this out to a year if his sx's are significantly improved.

## 2020-02-08 ENCOUNTER — Ambulatory Visit: Payer: PPO | Attending: Internal Medicine

## 2020-02-08 DIAGNOSIS — Z23 Encounter for immunization: Secondary | ICD-10-CM

## 2020-02-08 NOTE — Progress Notes (Signed)
   Covid-19 Vaccination Clinic  Name:  [name] [name]    MRN: [id number] DOB: [birth year]  02/08/2020  [name] was observed post Covid-19 immunization for 30 minutes based on pre-vaccination screening without incident. He was provided with Vaccine Information Sheet and instruction to access the V-Safe system.   [name] was instructed to call 911 with any severe reactions post vaccine: Marland Kitchen Difficulty breathing  . Swelling of face and throat  . A fast heartbeat  . A bad rash all over body  . Dizziness and weakness   Immunizations Administered    Name Date Dose VIS Date Route   Pfizer COVID-19 Vaccine 02/08/2020  9:34 AM 0.3 mL 11/18/2019 Intramuscular   Manufacturer: [place]   Lot: [id number]   [place]: [id number]

## 2020-02-21 DIAGNOSIS — M533 Sacrococcygeal disorders, not elsewhere classified: Secondary | ICD-10-CM | POA: Diagnosis not present

## 2020-03-07 ENCOUNTER — Encounter: Payer: Self-pay | Admitting: Internal Medicine

## 2020-03-12 ENCOUNTER — Encounter: Payer: Self-pay | Admitting: Urology

## 2020-03-12 ENCOUNTER — Other Ambulatory Visit: Payer: Self-pay

## 2020-03-12 DIAGNOSIS — N401 Enlarged prostate with lower urinary tract symptoms: Secondary | ICD-10-CM

## 2020-03-12 MED ORDER — TAMSULOSIN HCL 0.4 MG PO CAPS: 0.8000 mg | ORAL_CAPSULE | Freq: Every day | ORAL | 11 refills | Status: DC

## 2020-03-26 ENCOUNTER — Encounter: Payer: Self-pay | Admitting: Internal Medicine

## 2020-05-27 ENCOUNTER — Other Ambulatory Visit: Payer: Self-pay | Admitting: Physician Assistant

## 2020-05-29 ENCOUNTER — Ambulatory Visit: Payer: PPO

## 2020-06-03 ENCOUNTER — Other Ambulatory Visit: Payer: Self-pay | Admitting: Internal Medicine

## 2020-06-04 ENCOUNTER — Other Ambulatory Visit: Payer: Self-pay

## 2020-06-04 MED ORDER — SIMVASTATIN 40 MG PO TABS: 40.0000 mg | ORAL_TABLET | Freq: Every day | ORAL | 1 refills | Status: DC

## 2020-06-04 MED ORDER — METOPROLOL TARTRATE 50 MG PO TABS: 50.0000 mg | ORAL_TABLET | Freq: Two times a day (BID) | ORAL | 1 refills | Status: DC

## 2020-06-05 ENCOUNTER — Other Ambulatory Visit: Payer: Self-pay

## 2020-06-05 ENCOUNTER — Inpatient Hospital Stay: Payer: PPO | Attending: Internal Medicine

## 2020-06-05 ENCOUNTER — Inpatient Hospital Stay: Payer: PPO | Admitting: Internal Medicine

## 2020-06-05 ENCOUNTER — Encounter: Payer: Self-pay | Admitting: Internal Medicine

## 2020-06-05 VITALS — BP 131/78 | HR 80 | Temp 98.1°F | Resp 20 | Ht 70.0 in | Wt 167.7 lb

## 2020-06-05 DIAGNOSIS — D472 Monoclonal gammopathy: Secondary | ICD-10-CM

## 2020-06-05 DIAGNOSIS — I714 Abdominal aortic aneurysm, without rupture: Secondary | ICD-10-CM | POA: Diagnosis not present

## 2020-06-05 DIAGNOSIS — Z7982 Long term (current) use of aspirin: Secondary | ICD-10-CM | POA: Diagnosis not present

## 2020-06-05 DIAGNOSIS — Z87442 Personal history of urinary calculi: Secondary | ICD-10-CM | POA: Insufficient documentation

## 2020-06-05 DIAGNOSIS — D696 Thrombocytopenia, unspecified: Secondary | ICD-10-CM | POA: Insufficient documentation

## 2020-06-05 DIAGNOSIS — I10 Essential (primary) hypertension: Secondary | ICD-10-CM

## 2020-06-05 DIAGNOSIS — G8929 Other chronic pain: Secondary | ICD-10-CM | POA: Insufficient documentation

## 2020-06-05 DIAGNOSIS — Z79899 Other long term (current) drug therapy: Secondary | ICD-10-CM | POA: Diagnosis not present

## 2020-06-05 DIAGNOSIS — I252 Old myocardial infarction: Secondary | ICD-10-CM | POA: Insufficient documentation

## 2020-06-05 DIAGNOSIS — K219 Gastro-esophageal reflux disease without esophagitis: Secondary | ICD-10-CM | POA: Insufficient documentation

## 2020-06-05 DIAGNOSIS — M549 Dorsalgia, unspecified: Secondary | ICD-10-CM | POA: Diagnosis not present

## 2020-06-05 DIAGNOSIS — I2510 Atherosclerotic heart disease of native coronary artery without angina pectoris: Secondary | ICD-10-CM | POA: Insufficient documentation

## 2020-06-05 DIAGNOSIS — E785 Hyperlipidemia, unspecified: Secondary | ICD-10-CM | POA: Diagnosis not present

## 2020-06-05 LAB — CBC WITH DIFFERENTIAL (CANCER CENTER ONLY)
Abs Immature Granulocytes: 0.02 10*3/uL (ref 0.00–0.07)
Basophils Absolute: 0.1 10*3/uL (ref 0.0–0.1)
Basophils Relative: 1 %
Eosinophils Absolute: 0.7 10*3/uL — ABNORMAL HIGH (ref 0.0–0.5)
Eosinophils Relative: 8 %
HCT: 45.7 % (ref 39.0–52.0)
Hemoglobin: 15.1 g/dL (ref 13.0–17.0)
Immature Granulocytes: 0 %
Lymphocytes Relative: 21 %
Lymphs Abs: 2 10*3/uL (ref 0.7–4.0)
MCH: 30.9 pg (ref 26.0–34.0)
MCHC: 33 g/dL (ref 30.0–36.0)
MCV: 93.6 fL (ref 80.0–100.0)
Monocytes Absolute: 0.6 10*3/uL (ref 0.1–1.0)
Monocytes Relative: 6 %
Neutro Abs: 6 10*3/uL (ref 1.7–7.7)
Neutrophils Relative %: 64 %
Platelet Count: 199 10*3/uL (ref 150–400)
RBC: 4.88 MIL/uL (ref 4.22–5.81)
RDW: 13.4 % (ref 11.5–15.5)
WBC Count: 9.3 10*3/uL (ref 4.0–10.5)
nRBC: 0 % (ref 0.0–0.2)

## 2020-06-05 LAB — CMP (CANCER CENTER ONLY)
ALT: 17 U/L (ref 0–44)
AST: 21 U/L (ref 15–41)
Albumin: 4.2 g/dL (ref 3.5–5.0)
Alkaline Phosphatase: 85 U/L (ref 38–126)
Anion gap: 8 (ref 5–15)
BUN: 13 mg/dL (ref 8–23)
CO2: 24 mmol/L (ref 22–32)
Calcium: 9.3 mg/dL (ref 8.9–10.3)
Chloride: 106 mmol/L (ref 98–111)
Creatinine: 0.85 mg/dL (ref 0.61–1.24)
GFR, Est AFR Am: >60 mL/min (ref >60)
GFR, Estimated: >60 mL/min (ref >60)
Glucose, Bld: 109 mg/dL — ABNORMAL HIGH (ref 70–99)
Potassium: 4.3 mmol/L (ref 3.5–5.1)
Sodium: 138 mmol/L (ref 135–145)
Total Bilirubin: 0.5 mg/dL (ref 0.3–1.2)
Total Protein: 7.4 g/dL (ref 6.5–8.1)

## 2020-06-05 LAB — LACTATE DEHYDROGENASE: LDH: 141 U/L (ref 98–192)

## 2020-06-05 NOTE — Progress Notes (Signed)
[place] [phone]   [phone]  OFFICE PROGRESS NOTE  Hoyt Koch, MD [place] [id number]  DIAGNOSIS:  1) monoclonal history of undetermined significance. 2) thrombocytopenia  PRIOR THERAPY: None  CURRENT THERAPY: Observation  INTERVAL HISTORY: [name] [name] 79 y.o. male returns to the clinic today for annual follow-up visit.  The patient is feeling fine today with no concerning complaints except for the chronic back pain and he has stimulator for controlling his pain.  The patient denied having any chest pain, shortness of breath, cough or hemoptysis.  He denied having any fever or chills.  He has no nausea, vomiting, diarrhea or constipation.  He denied having any headache or visual changes.  He is here today for evaluation with repeat myeloma panel.  MEDICAL HISTORY: Past Medical History:  Diagnosis Date  . AAA (abdominal aortic aneurysm) ([place])   . Back pain    decompression belt bought off of TV  . CAD (coronary artery disease)   . Carotid artery disease ([place])    a. duplex 2015 - 1-39% bilaterally.  . Carotid bruit    November, 2013  . Cataract    bilateral - surgery to remove  . DDD (degenerative disc disease), lumbar    hands  . Dyslipidemia   . GERD (gastroesophageal reflux disease)   . History of kidney stones   . HTN (hypertension)   . Hx of CABG    2004  . MGUS (monoclonal gammopathy of unknown significance)   . Myocardial infarction ([place])   . 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 Spinal stenosis, lumbar    3-4  . Thrombocytopenia ([place])    Dr. Ralene Ok    ALLERGIES:  is allergic to penicillins.  MEDICATIONS:  Current Outpatient Medications  Medication Sig Dispense Refill  . aspirin 81 MG tablet Take 1 tablet (81 mg total) by mouth daily. 90 tablet 3  . betamethasone valerate ointment (VALISONE) 0.1 % Apply 1 application topically 2 (two)  times daily. 452 g 2  . docusate sodium (COLACE) 50 MG capsule Take 150 mg by mouth at bedtime.    Marland Kitchen esomeprazole (NEXIUM) 20 MG capsule Take 20 mg by mouth daily at 12 noon.    Marland Kitchen lisinopril (ZESTRIL) 40 MG tablet TAKE 1 TABLET BY MOUTH EVERY DAY 90 tablet 1  . metoprolol tartrate (LOPRESSOR) 50 MG tablet Take 1 tablet (50 mg total) by mouth 2 (two) times daily. 180 tablet 1  . montelukast (SINGULAIR) 10 MG tablet TAKE 1 TABLET BY MOUTH EVERYDAY AT BEDTIME 90 tablet 0  . Multiple Vitamins-Minerals (CENTRUM SILVER ULTRA MENS PO) Take 1 tablet by mouth daily.     . nitroGLYCERIN (NITROSTAT) 0.4 MG SL tablet Place 1 tablet (0.4 mg total) under the tongue every 5 (five) minutes as needed for chest pain. 25 tablet 3  . simvastatin (ZOCOR) 40 MG tablet Take 1 tablet (40 mg total) by mouth at bedtime. 90 tablet 1  . tamsulosin (FLOMAX) 0.4 MG CAPS capsule Take 2 capsules (0.8 mg total) by mouth daily. 60 capsule 11  . traMADol (ULTRAM) 50 MG tablet tramadol 50 mg tablet  Take 2 tablets 4 times a day by oral route as needed.     No current facility-administered medications for this visit.    SURGICAL HISTORY:  Past Surgical History:  Procedure Laterality Date  . BACK SURGERY    . COLONOSCOPY    .  COLONOSCOPY WITH PROPOFOL N/A 07/13/2017   Procedure: COLONOSCOPY WITH PROPOFOL;  Surgeon: Manus Gunning, MD;  Location: [facility];  Service: Gastroenterology;  Laterality: N/A;  . CORONARY ARTERY BYPASS GRAFT  2004  . CYSTOSCOPY WITH INSERTION OF UROLIFT N/A 12/14/2018   Procedure: CYSTOSCOPY WITH INSERTION OF UROLIFT;  Surgeon: Franchot Gallo, MD;  [facility];  Service: Urology;  Laterality: N/A;  . DECOMPRESSIVE LUMBAR LAMINECTOMY LEVEL 1 N/A 09/04/2016   Procedure: DECOMPRESSION L3 - L4;  Surgeon: Melina Schools, MD;  Location: [facility];  Service: Orthopedics;  Laterality: N/A;  . EYE SURGERY  2013   bilateral cataract extraction w/ IOL (Dr. Celene Squibb), retina surgery  . JOINT  REPLACEMENT    . LUMBAR LAMINECTOMY/DECOMPRESSION MICRODISCECTOMY  09/04/2016   L3-4  . SPINAL CORD STIMULATOR INSERTION N/A 05/13/2019   Procedure: LUMBAR SPINAL CORD STIMULATOR INSERTION;  Surgeon: Clydell Hakim, MD;  Location: [facility];  Service: Neurosurgery;  Laterality: N/A;  LUMBAR SPINAL CORD STIMULATOR INSERTION  . TOTAL HIP ARTHROPLASTY Right 10/2003    REVIEW OF SYSTEMS:  A comprehensive review of systems was negative except for: Musculoskeletal: positive for back pain   PHYSICAL EXAMINATION: General appearance: alert, cooperative and no distress Head: Normocephalic, without obvious abnormality, atraumatic Neck: no adenopathy, no JVD, supple, symmetrical, trachea midline and thyroid not enlarged, symmetric, no tenderness/mass/nodules Lymph nodes: Cervical, supraclavicular, and axillary nodes normal. Resp: clear to auscultation bilaterally Back: symmetric, no curvature. ROM normal. No CVA tenderness. Cardio: regular rate and rhythm, S1, S2 normal, no murmur, click, rub or gallop GI: soft, non-tender; bowel sounds normal; no masses,  no organomegaly Extremities: extremities normal, atraumatic, no cyanosis or edema  ECOG PERFORMANCE STATUS: 1 - Symptomatic but completely ambulatory  Blood pressure 131/78, pulse 80, temperature 98.1 F (36.7 C), temperature source Temporal, resp. rate 20, height 5\' 10"  (1.778 m), weight 167 lb 11.2 oz (76.1 kg), SpO2 98 %.  LABORATORY DATA: Lab Results  Component Value Date   WBC 9.3 06/05/2020   HGB 15.1 06/05/2020   HCT 45.7 06/05/2020   MCV 93.6 06/05/2020   PLT 199 06/05/2020      Chemistry      Component Value Date/Time   NA 140 06/06/2019 0931   NA 139 06/04/2017 1031   K 4.1 06/06/2019 0931   K 5.1 06/04/2017 1031   CL 107 06/06/2019 0931   CL 108 (H) 05/23/2013 1514   CO2 25 06/06/2019 0931   CO2 26 06/04/2017 1031   BUN 11 06/06/2019 0931   BUN 12.0 06/04/2017 1031   CREATININE 0.82 06/06/2019 0931   CREATININE 0.8  06/04/2017 1031      Component Value Date/Time   CALCIUM 9.1 06/06/2019 0931   CALCIUM 9.5 06/04/2017 1031   ALKPHOS 78 06/06/2019 0931   ALKPHOS 77 06/04/2017 1031   AST 17 06/06/2019 0931   AST 18 06/04/2017 1031   ALT 16 06/06/2019 0931   ALT 16 06/04/2017 1031   BILITOT 0.4 06/06/2019 0931   BILITOT 0.44 06/04/2017 1031       RADIOGRAPHIC STUDIES: No results found.  ASSESSMENT AND PLAN:  This is a very pleasant 79 years old white male with history of monoclonal gammopathy as well as thrombocytopenia he is currently on observation.  The patient is currently on observation and he is feeling fine today with no concerning complaints. He had repeat myeloma panel performed earlier today.  The CBC is unremarkable.  The remaining myeloma panel is still pending. I recommended for the patient  to continue on observation with repeat myeloma panel in 1 year unless the pending lab results showed any concerning findings I will call him sooner with recommendation. He was advised to call immediately if he has any other concerning symptoms in the interval. The patient voices understanding of current disease status and treatment options and is in agreement with the current care plan. All questions were answered. The patient knows to call the clinic with any problems, questions or concerns. We can certainly see the patient much sooner if necessary.  Disclaimer: This note was dictated with voice recognition software. Similar sounding words can inadvertently be transcribed and may not be corrected upon review.

## 2020-06-06 LAB — KAPPA/LAMBDA LIGHT CHAINS
Kappa free light chain: 17.1 mg/L (ref 3.3–19.4)
Kappa, lambda light chain ratio: 0.02 — ABNORMAL LOW (ref 0.26–1.65)
Lambda free light chains: 741.9 mg/L — ABNORMAL HIGH (ref 5.7–26.3)

## 2020-06-06 LAB — BETA 2 MICROGLOBULIN, SERUM: Beta-2 Microglobulin: 1.8 mg/L (ref 0.6–2.4)

## 2020-06-06 LAB — IGG, IGA, IGM
IgA: 33 mg/dL — ABNORMAL LOW (ref 61–437)
IgG (Immunoglobin G), Serum: 731 mg/dL (ref 603–1613)
IgM (Immunoglobulin M), Srm: 19 mg/dL (ref 15–143)

## 2020-06-07 ENCOUNTER — Telehealth: Payer: Self-pay | Admitting: Internal Medicine

## 2020-06-07 ENCOUNTER — Ambulatory Visit (INDEPENDENT_AMBULATORY_CARE_PROVIDER_SITE_OTHER): Payer: PPO

## 2020-06-07 DIAGNOSIS — Z0000 Encounter for general adult medical examination without abnormal findings: Secondary | ICD-10-CM | POA: Diagnosis not present

## 2020-06-07 NOTE — Telephone Encounter (Signed)
Scheduled per los. Called and left msg. Mailed printout  °

## 2020-06-07 NOTE — Progress Notes (Signed)
I connected with [name] [name] today by telephone and verified that I am speaking with the correct person using two identifiers. Location patient: home Location provider: work Persons participating in the virtual visit: [name] [name] and [name] Abu, LPN  I discussed the limitations, risks, security and privacy concerns of performing an evaluation and management service by telephone and the availability of in person appointments. I also discussed with the patient that there may be a patient responsible charge related to this service. The patient expressed understanding and verbally consented to this telephonic visit.    Interactive audio and video telecommunications were attempted between this provider and patient, however failed, due to patient having technical difficulties OR patient did not have access to video capability.  We continued and completed visit with audio only.  Some vital signs may be absent or patient reported.   Time Spent with patient on telephone encounter: 30 minutes  Subjective:   [name] [name] is a 79 y.o. male who presents for Medicare Annual/Subsequent preventive examination.  Review of Systems    No ROS. Medicare Wellness Virtual Visit. Additional risk factors are reflected in social history.  Cardiac Risk Factors include: advanced age (>76men, >91 women);dyslipidemia;family history of premature cardiovascular disease;hypertension;male gender;smoking/ tobacco exposure     Objective:    Today's Vitals   06/07/20 1333  PainSc: 9    There is no height or weight on file to calculate BMI.  Advanced Directives 06/07/2020 06/06/2019 05/11/2019 12/14/2018 12/09/2018 05/14/2018 07/13/2017  Does Patient Have a Medical Advance Directive? Yes Yes Yes No No Yes Yes  Type of Paramedic of [place];Living will Living will;Healthcare Power of [place];Living will - - [place];Living will [place];Living will  Does patient want to make changes to medical advance directive? No - Patient declined - No - Patient declined - - - -  Copy of [place] in Chart? No - copy requested No - copy requested No - copy requested - - - No - copy requested  Would patient like information on creating a medical advance directive? - - - No - Patient declined No - Patient declined - -    Current Medications (verified) Outpatient Encounter Medications as of 06/07/2020  Medication Sig  . aspirin 81 MG tablet Take 1 tablet (81 mg total) by mouth daily.  . betamethasone valerate ointment (VALISONE) 0.1 % Apply 1 application topically 2 (two) times daily.  Marland Kitchen docusate sodium (COLACE) 50 MG capsule Take 150 mg by mouth at bedtime.  Marland Kitchen esomeprazole (NEXIUM) 20 MG capsule Take 20 mg by mouth daily at 12 noon.  Marland Kitchen lisinopril (ZESTRIL) 40 MG tablet TAKE 1 TABLET BY MOUTH EVERY DAY  . metoprolol tartrate (LOPRESSOR) 50 MG tablet Take 1 tablet (50 mg total) by mouth 2 (two) times daily.  . montelukast (SINGULAIR) 10 MG tablet TAKE 1 TABLET BY MOUTH EVERYDAY AT BEDTIME  . Multiple Vitamins-Minerals (CENTRUM SILVER ULTRA MENS PO) Take 1 tablet by mouth daily.   . nitroGLYCERIN (NITROSTAT) 0.4 MG SL tablet Place 1 tablet (0.4 mg total) under the tongue every 5 (five) minutes as needed for chest pain.  . simvastatin (ZOCOR) 40 MG tablet Take 1 tablet (40 mg total) by mouth at bedtime.  . tamsulosin (FLOMAX) 0.4 MG CAPS capsule Take 2 capsules (0.8 mg total) by mouth daily.  . traMADol (ULTRAM) 50 MG tablet tramadol 50 mg tablet  Take 2 tablets 4  times a day by oral route as needed.   No facility-administered encounter medications on file as of 06/07/2020.    Allergies (verified) Penicillins   History: Past Medical History:  Diagnosis Date  . AAA (abdominal aortic aneurysm) ([place])   . Back pain    decompression belt bought off of TV  . CAD (coronary artery disease)   . Carotid artery  disease ([place])    a. duplex 2015 - 1-39% bilaterally.  . Carotid bruit    November, 2013  . Cataract    bilateral - surgery to remove  . DDD (degenerative disc disease), lumbar    hands  . Dyslipidemia   . GERD (gastroesophageal reflux disease)   . History of kidney stones   . HTN (hypertension)   . Hx of CABG    2004  . MGUS (monoclonal gammopathy of unknown significance)   . Myocardial infarction ([place])   . 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 Spinal stenosis, lumbar    3-4  . Thrombocytopenia ([place])    Dr. Ralene Ok   Past Surgical History:  Procedure Laterality Date  . BACK SURGERY    . COLONOSCOPY    . COLONOSCOPY WITH PROPOFOL N/A 07/13/2017   Procedure: COLONOSCOPY WITH PROPOFOL;  Surgeon: Manus Gunning, MD;  [facility];  Service: Gastroenterology;  Laterality: N/A;  . CORONARY ARTERY BYPASS GRAFT  2004  . CYSTOSCOPY WITH INSERTION OF UROLIFT N/A 12/14/2018   Procedure: CYSTOSCOPY WITH INSERTION OF UROLIFT;  Surgeon: Franchot Gallo, MD;  [facility];  Service: Urology;  Laterality: N/A;  . DECOMPRESSIVE LUMBAR LAMINECTOMY LEVEL 1 N/A 09/04/2016   Procedure: DECOMPRESSION L3 - L4;  Surgeon: Melina Schools, MD;  Location: [facility];  Service: Orthopedics;  Laterality: N/A;  . EYE SURGERY  2013   bilateral cataract extraction w/ IOL (Dr. Celene Squibb), retina surgery  . JOINT REPLACEMENT    . LUMBAR LAMINECTOMY/DECOMPRESSION MICRODISCECTOMY  09/04/2016   L3-4  . SPINAL CORD STIMULATOR INSERTION N/A 05/13/2019   Procedure: LUMBAR SPINAL CORD STIMULATOR INSERTION;  Surgeon: Clydell Hakim, MD;  Location: [facility];  Service: Neurosurgery;  Laterality: N/A;  LUMBAR SPINAL CORD STIMULATOR INSERTION  . TOTAL HIP ARTHROPLASTY Right 10/2003   Family History  Problem Relation Age of Onset  . Kidney disease Mother   . COPD Mother   . Stroke Father        cerebral hemorrhage  . Heart disease Brother   . Heart disease Brother   .  Arthritis Brother   . Cancer Paternal Grandfather    Social History   Socioeconomic History  . Marital status: Married    Spouse name: Not on file  . Number of children: 2  . Years of education: 50  . Highest education level: Not on file  Occupational History  . Occupation: trucking     Fish farm manager: RETIRED  Tobacco Use  . Smoking status: Heavy Tobacco Smoker    Packs/day: 0.50    Years: 65.00    Pack years: 32.50    Types: Cigarettes  . Smokeless tobacco: Never Used  . Tobacco comment: smoking since age 5 yrs old  Vaping Use  . Vaping Use: Never used  Substance and Sexual Activity  . Alcohol use: Not Currently    Comment: occasional beer  . Drug use: No  . Sexual activity: Not Currently  Other Topics Concern  . Not on file  Social History Narrative   HSG, Married '62, 1 son-'70,  1 dtr - '68; 2 grandchildren. Work - Probation officer.   ACP - DNR, DNI, no artificial hydration or feeding, no heroic measures   Social Determinants of Health   Financial Resource Strain:   . Difficulty of Paying Living Expenses:   Food Insecurity:   . Worried About Charity fundraiser in the Last Year:   . Arboriculturist in the Last Year:   Transportation Needs:   . Film/video editor (Medical):   Marland Kitchen Lack of Transportation (Non-Medical):   Physical Activity: Unknown  . Days of Exercise per Week: Patient refused  . Minutes of Exercise per Session: Patient refused  Stress:   . Feeling of Stress :   Social Connections:   . Frequency of Communication with Friends and Family:   . Frequency of Social Gatherings with Friends and Family:   . Attends Religious Services:   . Active Member of Clubs or Organizations:   . Attends Archivist Meetings:   Marland Kitchen Marital Status:     Tobacco Counseling Ready to quit: Not Answered Counseling given: Not Answered Comment: smoking since age 21 yrs old   Clinical Intake:  Pre-visit preparation completed: Yes  Pain : 0-10 Pain  Score: 9  Pain Location: Back Pain Orientation: Lower Pain Descriptors / Indicators: Constant, Dull, Discomfort Pain Onset: More than a month ago Pain Frequency: Constant Pain Relieving Factors: Tramadol Effect of Pain on Daily Activities: Yes; not able to do the things he use to do  Pain Relieving Factors: Tramadol  Nutritional Risks: None Diabetes: No  How often do you need to have someone help you when you read instructions, pamphlets, or other written materials from your doctor or pharmacy?: 1 - Never What is the last grade level you completed in school?: HSG; Retired truck driver/management  Diabetic? no  Interpreter Needed?: No  Information entered by :: [employer]. Nikaela Coyne, LPN   Activities of Daily Living In your present state of health, do you have any difficulty performing the following activities: 06/07/2020  Hearing? N  Vision? N  Difficulty concentrating or making decisions? N  Walking or climbing stairs? N  Dressing or bathing? N  Doing errands, shopping? N  Preparing Food and eating ? N  Using the Toilet? N  In the past six months, have you accidently leaked urine? Y  Do you have problems with loss of bowel control? N  Managing your Medications? N  Managing your Finances? N  Housekeeping or managing your Housekeeping? N  Some recent data might be hidden    Patient Care Team: Hoyt Koch, MD as PCP - General (Internal Medicine) Dorothy Spark, MD as PCP - Cardiology (Cardiology) Suella Broad, MD (Physical Medicine and Rehabilitation) Franchot Gallo, MD as Consulting Physician (Urology) Luberta Mutter, MD as Consulting Physician (Ophthalmology)  Indicate any recent Medical Services you may have received from other than [facility] providers in the past year (date may be approximate).     Assessment:   This is a routine wellness examination for [facility].  Hearing/Vision screen No exam data present  Dietary issues and exercise  activities discussed: Current Exercise Habits: The patient does not participate in regular exercise at present, Exercise limited by: neurologic condition(s)  Goals    .  Client understands the importance of follow-up with providers by attending scheduled visits (pt-stated)      Continue to stay alive.    .  Maintain current health status      Continue to exercise,  be as active as possible, show my tractors, be the best family man I can be.      Depression Screen PHQ 2/9 Scores 06/07/2020 05/17/2019 05/14/2018 08/31/2017 05/14/2017 01/31/2016 05/23/2014  PHQ - 2 Score 0 0 1 0 1 0 0  PHQ- 9 Score - - 3 - - - -    Fall Risk Fall Risk  06/07/2020 05/17/2019 05/14/2018 08/31/2017 05/14/2017  Falls in the past year? 0 0 No Yes No  Number falls in past yr: 0 0 - 1 -  Injury with Fall? 0 - - No -  Risk for fall due to : Impaired balance/gait;Impaired mobility;Other (Comment) - Impaired balance/gait;Impaired mobility - Impaired balance/gait;Impaired mobility  Risk for fall due to: Comment - - - - -  Follow up Falls evaluation completed - - - -    Any stairs in or around the home? No  If so, are there any without handrails? No  Home free of loose throw rugs in walkways, pet beds, electrical cords, etc? Yes  Adequate lighting in your home to reduce risk of falls? Yes   ASSISTIVE DEVICES UTILIZED TO PREVENT FALLS:  Life alert? No  Use of a cane, walker or w/c? Yes  Grab bars in the bathroom? Yes  Shower chair or bench in shower? Yes  Elevated toilet seat or a handicapped toilet? Yes   TIMED UP AND GO:  Was the test performed? No .  Length of time to ambulate 10 feet: 0 sec.   Gait slow and steady with assistive device  Cognitive Function: MMSE - Mini Mental State Exam 05/14/2018  Orientation to time 5  Orientation to Place 5  Registration 3  Attention/ Calculation 5  Recall 2  Language- name 2 objects 2  Language- repeat 1  Language- follow 3 step command 3  Language- read & follow direction 1   Write a sentence 1  Copy design 1  Total score 29        Immunizations Immunization History  Administered Date(s) Administered  . Fluad Quad(high Dose 65+) 09/28/2019  . Influenza Split 11/11/2011  . Influenza, High Dose Seasonal PF 11/05/2016, 11/19/2017  . Influenza, Seasonal, Injecte, Preservative Fre 12/06/2012  . Influenza,inj,Quad PF,6+ Mos 01/19/2015, 01/31/2016  . Influenza,inj,quad, With Preservative 11/25/2017  . PFIZER SARS-COV-2 Vaccination 01/14/2020, 02/08/2020  . Pneumococcal Conjugate-13 07/31/2015  . Pneumococcal Polysaccharide-23 12/06/2012  . Tdap 11/11/2011    TDAP status: Up to date Flu Vaccine status: Up to date Pneumococcal vaccine status: Up to date Covid-19 vaccine status: Completed vaccines  Qualifies for Shingles Vaccine? Yes   Zostavax completed No   Shingrix Completed?: No.    Education has been provided regarding the importance of this vaccine. Patient has been advised to call insurance company to determine out of pocket expense if they have not yet received this vaccine. Advised may also receive vaccine at local pharmacy or Health Dept. Verbalized acceptance and understanding.  Screening Tests Health Maintenance  Topic Date Due  . INFLUENZA VACCINE  07/08/2020  . TETANUS/TDAP  11/10/2021  . COVID-19 Vaccine  Completed  . PNA vac Low Risk Adult  Completed    Health Maintenance  There are no preventive care reminders to display for this patient.  Colorectal cancer screening: No longer required.   Lung Cancer Screening: (Low Dose CT Chest recommended if Age 7-80 years, 30 pack-year currently smoking OR have quit w/in 15years.) does qualify.   Lung Cancer Screening Referral: no  Additional Screening:  Hepatitis C Screening: does  not qualify; Completed no  Vision Screening: Recommended annual ophthalmology exams for early detection of glaucoma and other disorders of the eye. Is the patient up to date with their annual eye exam?  Yes    Who is the provider or what is the name of the office in which the patient attends annual eye exams? Luberta Mutter, MD If pt is not established with a provider, would they like to be referred to a provider to establish care? No .   Dental Screening: Recommended annual dental exams for proper oral hygiene  Community Resource Referral / Chronic Care Management: CRR required this visit?  No   CCM required this visit?  No      Plan:     I have personally reviewed and noted the following in the patient's chart:   . Medical and social history . Use of alcohol, tobacco or illicit drugs  . Current medications and supplements . Functional ability and status . Nutritional status . Physical activity . Advanced directives . List of other physicians . Hospitalizations, surgeries, and ER visits in previous 12 months . Vitals . Screenings to include cognitive, depression, and falls . Referrals and appointments  In addition, I have reviewed and discussed with patient certain preventive protocols, quality metrics, and best practice recommendations. A written personalized care plan for preventive services as well as general preventive health recommendations were provided to patient.     Sheral Flow, LPN   05/13/629   Nurse Notes:  Patient is cogitatively intact. There were no vitals filed for this visit. There is no height or weight on file to calculate BMI. Patient stated that he has issues with gait or balance; uses assistive devices.

## 2020-06-07 NOTE — Patient Instructions (Addendum)
[name] [name] , Thank you for taking time to come for your Medicare Wellness Visit. I appreciate your ongoing commitment to your health goals. Please review the following plan we discussed and let me know if I can assist you in the future.   Screening recommendations/referrals: Colonoscopy: 07/13/2017; not a candidate for colon cancer screening due to age Recommended yearly ophthalmology/optometry visit for glaucoma screening and checkup Recommended yearly dental visit for hygiene and checkup  Vaccinations: Influenza vaccine: 09/28/2019 Pneumococcal vaccine: completed Tdap vaccine: 11/11/2011; due every 10 years Shingles vaccine: never done   Covid-19: completed  Advanced directives: Please bring a copy of your health care power of attorney and living will to the office at your convenience.  Conditions/risks identified: Please continue to do your personal lifestyle choices by: daily care of teeth and gums, regular physical activity (goal should be 5 days a week for 30 minutes), eat a healthy diet, avoid tobacco and drug use, limiting any alcohol intake, taking a low-dose aspirin (if not allergic or have been advised by your provider otherwise) and taking vitamins and minerals as recommended by your provider. Continue doing brain stimulating activities (puzzles, reading, adult coloring books, staying active) to keep memory sharp. Continue to eat heart healthy diet (full of fruits, vegetables, whole grains, lean protein, water--limit salt, fat, and sugar intake) and increase physical activity as tolerated.  Next appointment: Please schedule your next Medicare Wellness Visit with your Nurse Health Advisor in 1 year.  Preventive Care 80 Years and Older, Male Preventive care refers to lifestyle choices and visits with your health care provider that can promote health and wellness. What does preventive care include?  A yearly physical exam. This is also called an annual well check.  Dental exams once  or twice a year.  Routine eye exams. Ask your health care provider how often you should have your eyes checked.  Personal lifestyle choices, including:  Daily care of your teeth and gums.  Regular physical activity.  Eating a healthy diet.  Avoiding tobacco and drug use.  Limiting alcohol use.  Practicing safe sex.  Taking low doses of aspirin every day.  Taking vitamin and mineral supplements as recommended by your health care provider. What happens during an annual well check? The services and screenings done by your health care provider during your annual well check will depend on your age, overall health, lifestyle risk factors, and family history of disease. Counseling  Your health care provider may ask you questions about your:  Alcohol use.  Tobacco use.  Drug use.  Emotional well-being.  Home and relationship well-being.  Sexual activity.  Eating habits.  History of falls.  Memory and ability to understand (cognition).  Work and work Statistician. Screening  You may have the following tests or measurements:  Height, weight, and BMI.  Blood pressure.  Lipid and cholesterol levels. These may be checked every 5 years, or more frequently if you are over 60 years old.  Skin check.  Lung cancer screening. You may have this screening every year starting at age 80 if you have a 30-pack-year history of smoking and currently smoke or have quit within the past 15 years.  Fecal occult blood test (FOBT) of the stool. You may have this test every year starting at age 21.  Flexible sigmoidoscopy or colonoscopy. You may have a sigmoidoscopy every 5 years or a colonoscopy every 10 years starting at age 80.  Prostate cancer screening. Recommendations will vary depending on your family history and  other risks.  Hepatitis C blood test.  Hepatitis B blood test.  Sexually transmitted disease (STD) testing.  Diabetes screening. This is done by checking your blood  sugar (glucose) after you have not eaten for a while (fasting). You may have this done every 1-3 years.  Abdominal aortic aneurysm (AAA) screening. You may need this if you are a current or former smoker.  Osteoporosis. You may be screened starting at age 80 if you are at high risk. Talk with your health care provider about your test results, treatment options, and if necessary, the need for more tests. Vaccines  Your health care provider may recommend certain vaccines, such as:  Influenza vaccine. This is recommended every year.  Tetanus, diphtheria, and acellular pertussis (Tdap, Td) vaccine. You may need a Td booster every 10 years.   Zoster vaccine. You may need this after age 15.  Pneumococcal 13-valent conjugate (PCV13) vaccine. One dose is recommended after age 80.  Pneumococcal polysaccharide (PPSV23) vaccine. One dose is recommended after age 80. Talk to your health care provider about which screenings and vaccines you need and how often you need them. This information is not intended to replace advice given to you by your health care provider. Make sure you discuss any questions you have with your health care provider. Document Released: 12/21/2015 Document Revised: 08/13/2016 Document Reviewed: 09/25/2015 Elsevier Interactive Patient Education  2017 [place] Prevention in the Home Falls can cause injuries. They can happen to people of all ages. There are many things you can do to make your home safe and to help prevent falls. What can I do on the outside of my home?  Regularly fix the edges of walkways and driveways and fix any cracks.  Remove anything that might make you trip as you walk through a door, such as a raised step or threshold.  Trim any bushes or trees on the path to your home.  Use bright outdoor lighting.  Clear any walking paths of anything that might make someone trip, such as rocks or tools.  Regularly check to see if handrails are loose or  broken. Make sure that both sides of any steps have handrails.  Any raised decks and porches should have guardrails on the edges.  Have any leaves, snow, or ice cleared regularly.  Use sand or salt on walking paths during winter.  Clean up any spills in your garage right away. This includes oil or grease spills. What can I do in the bathroom?  Use night lights.  Install grab bars by the toilet and in the tub and shower. Do not use towel bars as grab bars.  Use non-skid mats or decals in the tub or shower.  If you need to sit down in the shower, use a plastic, non-slip stool.  Keep the floor dry. Clean up any water that spills on the floor as soon as it happens.  Remove soap buildup in the tub or shower regularly.  Attach bath mats securely with double-sided non-slip rug tape.  Do not have throw rugs and other things on the floor that can make you trip. What can I do in the bedroom?  Use night lights.  Make sure that you have a light by your bed that is easy to reach.  Do not use any sheets or blankets that are too big for your bed. They should not hang down onto the floor.  Have a firm chair that has side arms. You can use this for  support while you get dressed.  Do not have throw rugs and other things on the floor that can make you trip. What can I do in the kitchen?  Clean up any spills right away.  Avoid walking on wet floors.  Keep items that you use a lot in easy-to-reach places.  If you need to reach something above you, use a strong step stool that has a grab bar.  Keep electrical cords out of the way.  Do not use floor polish or wax that makes floors slippery. If you must use wax, use non-skid floor wax.  Do not have throw rugs and other things on the floor that can make you trip. What can I do with my stairs?  Do not leave any items on the stairs.  Make sure that there are handrails on both sides of the stairs and use them. Fix handrails that are  broken or loose. Make sure that handrails are as long as the stairways.  Check any carpeting to make sure that it is firmly attached to the stairs. Fix any carpet that is loose or worn.  Avoid having throw rugs at the top or bottom of the stairs. If you do have throw rugs, attach them to the floor with carpet tape.  Make sure that you have a light switch at the top of the stairs and the bottom of the stairs. If you do not have them, ask someone to add them for you. What else can I do to help prevent falls?  Wear shoes that:  Do not have high heels.  Have rubber bottoms.  Are comfortable and fit you well.  Are closed at the toe. Do not wear sandals.  If you use a stepladder:  Make sure that it is fully opened. Do not climb a closed stepladder.  Make sure that both sides of the stepladder are locked into place.  Ask someone to hold it for you, if possible.  Clearly mark and make sure that you can see:  Any grab bars or handrails.  First and last steps.  Where the edge of each step is.  Use tools that help you move around (mobility aids) if they are needed. These include:  Canes.  Walkers.  Scooters.  Crutches.  Turn on the lights when you go into a dark area. Replace any light bulbs as soon as they burn out.  Set up your furniture so you have a clear path. Avoid moving your furniture around.  If any of your floors are uneven, fix them.  If there are any pets around you, be aware of where they are.  Review your medicines with your doctor. Some medicines can make you feel dizzy. This can increase your chance of falling. Ask your doctor what other things that you can do to help prevent falls. This information is not intended to replace advice given to you by your health care provider. Make sure you discuss any questions you have with your health care provider. Document Released: 09/20/2009 Document Revised: 05/01/2016 Document Reviewed: 12/29/2014 Elsevier  Interactive Patient Education  2017 [employer].

## 2020-06-12 DIAGNOSIS — G894 Chronic pain syndrome: Secondary | ICD-10-CM | POA: Diagnosis not present

## 2020-06-12 DIAGNOSIS — M533 Sacrococcygeal disorders, not elsewhere classified: Secondary | ICD-10-CM | POA: Diagnosis not present

## 2020-06-12 DIAGNOSIS — Z5181 Encounter for therapeutic drug level monitoring: Secondary | ICD-10-CM | POA: Diagnosis not present

## 2020-06-12 DIAGNOSIS — Z79899 Other long term (current) drug therapy: Secondary | ICD-10-CM | POA: Diagnosis not present

## 2020-06-12 DIAGNOSIS — M5136 Other intervertebral disc degeneration, lumbar region: Secondary | ICD-10-CM | POA: Diagnosis not present

## 2020-06-12 DIAGNOSIS — M961 Postlaminectomy syndrome, not elsewhere classified: Secondary | ICD-10-CM | POA: Diagnosis not present

## 2020-07-10 ENCOUNTER — Ambulatory Visit (INDEPENDENT_AMBULATORY_CARE_PROVIDER_SITE_OTHER): Payer: PPO | Admitting: Internal Medicine

## 2020-07-10 ENCOUNTER — Other Ambulatory Visit: Payer: Self-pay

## 2020-07-10 ENCOUNTER — Encounter: Payer: Self-pay | Admitting: Internal Medicine

## 2020-07-10 DIAGNOSIS — L0231 Cutaneous abscess of buttock: Secondary | ICD-10-CM

## 2020-07-10 MED ORDER — IPRATROPIUM BROMIDE 0.03 % NA SOLN: 2.0000 | Freq: Two times a day (BID) | NASAL | 12 refills | Status: DC

## 2020-07-10 MED ORDER — SULFAMETHOXAZOLE-TRIMETHOPRIM 800-160 MG PO TABS: 1.0000 | ORAL_TABLET | Freq: Two times a day (BID) | ORAL | 0 refills | Status: DC

## 2020-07-10 MED ORDER — BETAMETHASONE VALERATE 0.1 % EX OINT: 1.0000 "application " | TOPICAL_OINTMENT | Freq: Two times a day (BID) | CUTANEOUS | 2 refills | Status: AC

## 2020-07-10 NOTE — Progress Notes (Signed)
   Subjective:   Patient ID: [name] [name], male    DOB: [birth year], 79 y.o.   MRN: [id number]  HPI The patient is an 78 YO man coming in for pain and abscess on buttocks left near the bottom. Started about 1-2 days ago. Does get MRSA abscesses in the past but not on his bottom. Painful and he cannot tell if growing. Has covered and leaking small amount of fluid. Denies fevers or chills. Wife is applying antibiotic ointment to area.   Review of Systems  Constitutional: Negative.   HENT: Negative.   Eyes: Negative.   Respiratory: Negative for cough, chest tightness and shortness of breath.   Cardiovascular: Negative for chest pain, palpitations and leg swelling.  Gastrointestinal: Negative for abdominal distention, abdominal pain, constipation, diarrhea, nausea and vomiting.  Musculoskeletal: Negative.   Skin: Positive for rash and wound.  Neurological: Negative.   Psychiatric/Behavioral: Negative.     Objective:  Physical Exam Constitutional:      Appearance: He is well-developed.  HENT:     Head: Normocephalic and atraumatic.  Cardiovascular:     Rate and Rhythm: Normal rate and regular rhythm.  Pulmonary:     Effort: Pulmonary effort is normal. No respiratory distress.     Breath sounds: Normal breath sounds. No wheezing or rales.  Abdominal:     General: Bowel sounds are normal. There is no distension.     Palpations: Abdomen is soft.     Tenderness: There is no abdominal tenderness. There is no rebound.  Genitourinary:    Comments: Abscess firm on the left lower buttocks, without opening and not amenable to I and D, no surrounding cellulitis.  Musculoskeletal:     Cervical back: Normal range of motion.  Skin:    General: Skin is warm and dry.  Neurological:     Mental Status: He is alert and oriented to person, place, and time.     Coordination: Coordination normal.     Vitals:   07/10/20 1350  BP: 116/76  Pulse: 89  Temp: 97.8 F (36.6 C)  TempSrc: Oral    SpO2: 96%  Weight: 171 lb (77.6 kg)  Height: 5\' 10"  (1.778 m)    This visit occurred during the SARS-CoV-2 public health emergency.  Safety protocols were in place, including screening questions prior to the visit, additional usage of staff PPE, and extensive cleaning of exam room while observing appropriate contact time as indicated for disinfecting solutions.   Assessment & Plan:

## 2020-07-10 NOTE — Patient Instructions (Addendum)
We have sent in bactrim to help the backside. Take 1 pill twice a day for 1 week.  We have sent in the atrovent nose spray to use 1 spray in each nostril about 10 minutes before eating to help.   We have sent in a refill of the cream to use on a q-tip for the ears once a day.

## 2020-07-11 DIAGNOSIS — L0231 Cutaneous abscess of buttock: Secondary | ICD-10-CM | POA: Insufficient documentation

## 2020-07-11 NOTE — Assessment & Plan Note (Signed)
Rx bactrim and advised if no improvement may need return for I and D. Not amenable to I and D today.

## 2020-08-08 ENCOUNTER — Ambulatory Visit: Payer: PPO | Admitting: Cardiology

## 2020-08-08 ENCOUNTER — Encounter: Payer: Self-pay | Admitting: Cardiology

## 2020-08-08 ENCOUNTER — Other Ambulatory Visit: Payer: Self-pay

## 2020-08-08 VITALS — BP 148/90 | HR 71 | Ht 70.0 in | Wt 171.6 lb

## 2020-08-08 DIAGNOSIS — I10 Essential (primary) hypertension: Secondary | ICD-10-CM | POA: Diagnosis not present

## 2020-08-08 DIAGNOSIS — I2510 Atherosclerotic heart disease of native coronary artery without angina pectoris: Secondary | ICD-10-CM

## 2020-08-08 DIAGNOSIS — E785 Hyperlipidemia, unspecified: Secondary | ICD-10-CM

## 2020-08-08 MED ORDER — METOPROLOL TARTRATE 50 MG PO TABS: 50.0000 mg | ORAL_TABLET | Freq: Two times a day (BID) | ORAL | 1 refills | Status: DC

## 2020-08-08 MED ORDER — SIMVASTATIN 40 MG PO TABS: 40.0000 mg | ORAL_TABLET | Freq: Every day | ORAL | 1 refills | Status: DC

## 2020-08-08 NOTE — Patient Instructions (Signed)

## 2020-08-08 NOTE — Progress Notes (Signed)
Cardiology Office Note    Date:  08/08/2020  ID:  [name] [name], DOB 12-29-1940, MRN [id number] PCP:  Hoyt Koch, MD  Cardiologist:  [name], MD   Chief Complaint: 6 months follow up  History of Present Illness:  [name] [name] is a 79 y.o. male with history of CAD s/p CABGx5V (2004), small AAA followed by Dr. Donnetta Hutching, carotid artery disease, tobacco abuse, thrombocytopenia/MGUS, HTN, HLD, RBBB/LPFB, hyperglycemia who presents for f/u of elevated blood pressure. The patient is coming after 6 months, he now walks with a walker because he has significant lower back pain secondary to degenerative disc disease.  However he denies any chest pain or shortness of breath.  He has no lower extremity edema or claudications.  He has been compliant with his medications and has no side effects. His last AAA duplex was in February 2021 that showed The largest aortic diameter remains essentially unchanged  compared to prior exam. Previous diameter measurement was 4.53 x 4.45 cm obtained on 06/13/2019.   Carotid duplex in 10/2014 showed 1-39% stenosis, f/u recommended 2 years. Last echocardiogram in 2004 showed EF 55-60%, mild LVH, abnormal relaxation. Nuclear stress test in 2011 showed no ischemia.   Past Medical History:  Diagnosis Date  . AAA (abdominal aortic aneurysm) ([place])   . Back pain    decompression belt bought off of TV  . CAD (coronary artery disease)   . Carotid artery disease ([place])    a. duplex 2015 - 1-39% bilaterally.  . Carotid bruit    November, 2013  . Cataract    bilateral - surgery to remove  . DDD (degenerative disc disease), lumbar    hands  . Dyslipidemia   . GERD (gastroesophageal reflux disease)   . History of kidney stones   . HTN (hypertension)   . Hx of CABG    2004  . MGUS (monoclonal gammopathy of unknown significance)   . Myocardial infarction ([place])   . 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 Spinal  stenosis, lumbar    3-4  . Thrombocytopenia ([place])    Dr. Ralene Ok    Past Surgical History:  Procedure Laterality Date  . BACK SURGERY    . COLONOSCOPY    . COLONOSCOPY WITH PROPOFOL N/A 07/13/2017   Procedure: COLONOSCOPY WITH PROPOFOL;  Surgeon: Manus Gunning, MD;  Location: [facility];  Service: Gastroenterology;  Laterality: N/A;  . CORONARY ARTERY BYPASS GRAFT  2004  . CYSTOSCOPY WITH INSERTION OF UROLIFT N/A 12/14/2018   Procedure: CYSTOSCOPY WITH INSERTION OF UROLIFT;  Surgeon: Franchot Gallo, MD;  Location: [facility];  Service: Urology;  Laterality: N/A;  . DECOMPRESSIVE LUMBAR LAMINECTOMY LEVEL 1 N/A 09/04/2016   Procedure: DECOMPRESSION L3 - L4;  Surgeon: Melina Schools, MD;  Location: [facility];  Service: Orthopedics;  Laterality: N/A;  . EYE SURGERY  2013   bilateral cataract extraction w/ IOL (Dr. Celene Squibb), retina surgery  . JOINT REPLACEMENT    . LUMBAR LAMINECTOMY/DECOMPRESSION MICRODISCECTOMY  09/04/2016   L3-4  . SPINAL CORD STIMULATOR INSERTION N/A 05/13/2019   Procedure: LUMBAR SPINAL CORD STIMULATOR INSERTION;  Surgeon: Clydell Hakim, MD;  Location: [facility];  Service: Neurosurgery;  Laterality: N/A;  LUMBAR SPINAL CORD STIMULATOR INSERTION  . TOTAL HIP ARTHROPLASTY Right 10/2003   Current Medications: Current Meds  Medication Sig  . aspirin 81 MG tablet Take 1 tablet (81 mg total) by mouth daily.  . betamethasone valerate ointment (VALISONE) 0.1 % Apply  1 application topically 2 (two) times daily.  Marland Kitchen docusate sodium (COLACE) 50 MG capsule Take 150 mg by mouth at bedtime.  Marland Kitchen esomeprazole (NEXIUM) 20 MG capsule Take 20 mg by mouth daily at 12 noon.  Marland Kitchen ipratropium (ATROVENT) 0.03 % nasal spray Place 2 sprays into both nostrils every 12 (twelve) hours.  Marland Kitchen lisinopril (ZESTRIL) 40 MG tablet TAKE 1 TABLET BY MOUTH EVERY DAY  . montelukast (SINGULAIR) 10 MG tablet TAKE 1 TABLET BY MOUTH EVERYDAY AT BEDTIME  . Multiple Vitamins-Minerals (CENTRUM SILVER ULTRA MENS PO) Take  1 tablet by mouth daily.   . nitroGLYCERIN (NITROSTAT) 0.4 MG SL tablet Place 1 tablet (0.4 mg total) under the tongue every 5 (five) minutes as needed for chest pain.  . tamsulosin (FLOMAX) 0.4 MG CAPS capsule Take 2 capsules (0.8 mg total) by mouth daily.  . traMADol (ULTRAM) 50 MG tablet tramadol 50 mg tablet  Take 2 tablets 4 times a day by oral route as needed.  . [DISCONTINUED] metoprolol tartrate (LOPRESSOR) 50 MG tablet Take 1 tablet (50 mg total) by mouth 2 (two) times daily.  . [DISCONTINUED] simvastatin (ZOCOR) 40 MG tablet Take 1 tablet (40 mg total) by mouth at bedtime.     Allergies:   Penicillins   Social History   Socioeconomic History  . Marital status: Married    Spouse name: Not on file  . Number of children: 2  . Years of education: 77  . Highest education level: Not on file  Occupational History  . Occupation: trucking     Fish farm manager: RETIRED  Tobacco Use  . Smoking status: Heavy Tobacco Smoker    Packs/day: 0.50    Years: 65.00    Pack years: 32.50    Types: Cigarettes  . Smokeless tobacco: Never Used  . Tobacco comment: smoking since age 46 yrs old  Vaping Use  . Vaping Use: Never used  Substance and Sexual Activity  . Alcohol use: Not Currently    Comment: occasional beer  . Drug use: No  . Sexual activity: Not Currently  Other Topics Concern  . Not on file  Social History Narrative   HSG, Married '62, 1 son-'70, 1 dtr - '68; 2 grandchildren. Work - Probation officer.   ACP - DNR, DNI, no artificial hydration or feeding, no heroic measures   Social Determinants of Health   Financial Resource Strain:   . Difficulty of Paying Living Expenses: Not on file  Food Insecurity:   . Worried About Charity fundraiser in the Last Year: Not on file  . Ran Out of Food in the Last Year: Not on file  Transportation Needs:   . Lack of Transportation (Medical): Not on file  . Lack of Transportation (Non-Medical): Not on file  Physical Activity: Unknown   . Days of Exercise per Week: Patient refused  . Minutes of Exercise per Session: Patient refused  Stress:   . Feeling of Stress : Not on file  Social Connections:   . Frequency of Communication with Friends and Family: Not on file  . Frequency of Social Gatherings with Friends and Family: Not on file  . Attends Religious Services: Not on file  . Active Member of Clubs or Organizations: Not on file  . Attends Archivist Meetings: Not on file  . Marital Status: Not on file    Family History:  The patient's family history includes Arthritis in his brother; COPD in his mother; Cancer in his paternal grandfather; Heart disease  in his brother and brother; Kidney disease in his mother; Stroke in his father.  ROS:   Please see the history of present illness.  All other systems are reviewed and otherwise negative.   PHYSICAL EXAM:   VS:  BP (!) 148/90   Pulse 71   Ht 5\' 10"  (1.778 m)   Wt 171 lb 9.6 oz (77.8 kg)   SpO2 97%   BMI 24.62 kg/m   BMI: Body mass index is 24.62 kg/m. GEN: Well nourished, well developed WM, in no acute distress HEENT: normocephalic, atraumatic Neck: no JVD, carotid bruits, or masses Cardiac: RRR; no murmurs, rubs, or gallops, no edema  Respiratory:  Coarse BS throughout, no wheezing or rales, normal work of breathing GI: soft, nontender, nondistended, + BS MS: no deformity or atrophy Skin: warm and dry, no rash Neuro:  Alert and Oriented x 3, Strength and sensation are intact, follows commands Psych: euthymic mood, full affect  Wt Readings from Last 3 Encounters:  08/08/20 171 lb 9.6 oz (77.8 kg)  07/10/20 171 lb (77.6 kg)  06/05/20 167 lb 11.2 oz (76.1 kg)    Studies/Labs Reviewed:   EKG:  EKG was ordered today.  It shows normal sinus rhythm with first-degree AV block, right bundle branch block and left anterior fascicular block unchanged from prior.  Recent Labs: 06/05/2020: ALT 17; BUN 13; Creatinine 0.85; Hemoglobin 15.1; Platelet Count  199; Potassium 4.3; Sodium 138   Lipid Panel    Component Value Date/Time   CHOL 131 11/09/2019 1029   TRIG 49 11/09/2019 1029   HDL 47 11/09/2019 1029   CHOLHDL 2.8 11/09/2019 1029   CHOLHDL 3 01/31/2016 0847   VLDL 15.4 01/31/2016 0847   LDLCALC 73 11/09/2019 1029   Additional studies/ records that were reviewed today include: Summarized above   ASSESSMENT & PLAN:   1. Essential HTN - repeat BP today is 130/75 mmHg, I would continue this regimen as higher might cause orthostativ hypotension.  He reports that at home sometimes his blood pressure gets as low as 100 and is associated with some dizziness.  However no falls. 2. CAD s/p CABG - asymptomatic, no angina. Continue ASA, BB, statin.  He is lipids are all at goal.  Normal liver function test.  3. PAD with small AAA and carotid artery disease- followed by vascular. 4. Bifascicular block with RBBB, LPFB - asymptomatic, unchanged 5. Hyperlipidemia - lipids all at goal, he is tolerating simvastatin well..  Disposition: F/u with Dr. Meda Coffee 6 months.  Medication Adjustments/Labs and Tests Ordered: Current medicines are reviewed at length with the patient today.  Concerns regarding medicines are outlined above. Medication changes, Labs and Tests ordered today are summarized above and listed in the Patient Instructions accessible in Encounters.   Signed, [name], MD  08/08/2020 9:57 AM    [facility] [address], [place], [place]  [address] Phone: [phone]; Fax: [phone]

## 2020-08-14 ENCOUNTER — Ambulatory Visit (INDEPENDENT_AMBULATORY_CARE_PROVIDER_SITE_OTHER): Payer: PPO | Admitting: Urology

## 2020-08-14 ENCOUNTER — Ambulatory Visit: Payer: PPO | Admitting: Urology

## 2020-08-14 ENCOUNTER — Other Ambulatory Visit: Payer: Self-pay

## 2020-08-14 ENCOUNTER — Encounter: Payer: Self-pay | Admitting: Urology

## 2020-08-14 VITALS — BP 163/72 | HR 66 | Temp 97.5°F | Ht 70.0 in | Wt 171.6 lb

## 2020-08-14 DIAGNOSIS — N401 Enlarged prostate with lower urinary tract symptoms: Secondary | ICD-10-CM

## 2020-08-14 DIAGNOSIS — R339 Retention of urine, unspecified: Secondary | ICD-10-CM

## 2020-08-14 DIAGNOSIS — R3915 Urgency of urination: Secondary | ICD-10-CM

## 2020-08-14 DIAGNOSIS — R350 Frequency of micturition: Secondary | ICD-10-CM

## 2020-08-14 LAB — URINALYSIS, ROUTINE W REFLEX MICROSCOPIC
Bilirubin, UA: NEGATIVE
Glucose, UA: NEGATIVE
Ketones, UA: NEGATIVE
Leukocytes,UA: NEGATIVE
Nitrite, UA: NEGATIVE
Protein,UA: NEGATIVE
RBC, UA: NEGATIVE
Specific Gravity, UA: 1.025 (ref 1.005–1.030)
Urobilinogen, Ur: 0.2 mg/dL (ref 0.2–1.0)
pH, UA: 6 (ref 5.0–7.5)

## 2020-08-14 LAB — BLADDER SCAN AMB NON-IMAGING: Scan Result: 196.2

## 2020-08-14 MED ORDER — CIPROFLOXACIN HCL 500 MG PO TABS
500.0000 mg | ORAL_TABLET | Freq: Once | ORAL | Status: AC
  Administered 2020-08-14: 500 mg via ORAL

## 2020-08-14 NOTE — Progress Notes (Signed)
Urological Symptom Review  Patient is experiencing the following symptoms: Get up at night to urinate Stream starts and stops Trouble starting stream Weak stream   Review of Systems  Gastrointestinal (upper)  : Negative for upper GI symptoms  Gastrointestinal (lower) : Negative for lower GI symptoms  Constitutional : Negative for symptoms  Skin: Negative for skin symptoms  Eyes: Negative for eye symptoms  Ear/Nose/Throat : Sinus problems  Hematologic/Lymphatic: Negative for Hematologic/Lymphatic symptoms  Cardiovascular : Negative for cardiovascular symptoms  Respiratory : Negative for respiratory symptoms  Endocrine: Negative for endocrine symptoms  Musculoskeletal: Back pain  Neurological: Negative for neurological symptoms  Psychologic: Negative for psychiatric symptoms

## 2020-08-14 NOTE — Progress Notes (Signed)
H&P  Chief Complaint: BPH w/ LUTS  History of Present Illness:  9.7.2021: Pt notes that his symptoms have remained stable. Pt reports that urinary hesitancy as his most bothersome symptom. When he is able to initiate his stream he reports a weak FOS. Pt continuing on 2 tamsulosin per day. Pt denies any recent UTIs.  IPSS Questionnaire (AUA-7): Over the past month.   1)  How often have you had a sensation of not emptying your bladder completely after you finish urinating?  3 - About half the time  2)  How often have you had to urinate again less than two hours after you finished urinating? 2 - Less than half the time  3)  How often have you found you stopped and started again several times when you urinated?  4 - More than half the time  4) How difficult have you found it to postpone urination?  2 - Less than half the time  5) How often have you had a weak urinary stream?  3 - About half the time  6) How often have you had to push or strain to begin urination?  3 - About half the time  7) How many times did you most typically get up to urinate from the time you went to bed until the time you got up in the morning?  3 - 3 times  Total score:  0-7 mildly symptomatic   8-19 moderately symptomatic   20-35 severely symptomatic  QOL score: 4   (below copied from [facility] records):  BPH:  [name] [name] is a 79 year-old male established patient who is here for an enlarged prostate follow-up evaluation.  He is currently taking tamsulosin.   9.10.19--Seen for worsening lower urinary tract symptoms despite being on dual medical therapy. Residual urine volume was 195 mL.   11.26.2019: He is here for cystoscopy, flow rate. Recent prostate ultrasound revealed volume of 33.4 mL. Mild intravesical protrusion of prostate but no definitive median lobe.   1.7.2020: Urolift--6 implants placed.   2.25.2020: He is here today for follow-up of his Urolift. He is now off of both tamsulosin capsules. He  has had no recent difficulty. He did have dysuria for 4 days. IPSS 15. He is only waking up once a night to urinate. Biggest issue is a slow stream but this is not bothersome.   8.18.2020: He returns for follow-up re his Urolift. He is now very unsatisfied with his procedure. He states that he has no urinary pressure and must sit there for at least 5 minutes urinating. He also states that he wakes up every 45 mins - 1.5 hours during the night to urinate. He does not feel like he is emptying out well at all. This is most troublesome during the night, in the day he only urinates every 2-3 hours. Restarted on tamsulosin.   11.3.2020: Here today for follow-up. He states that since last visit he has had improvements to urinary stream and sx's. He was scheduled for possible cysto but he states he has had enough improvement w/ 1x tamsulosin that he does want further evaluation at this point. He is hesitant about being on more medication than he already is.   Back Pain:  11.3.2020: He is having severe chronic back pain. He is taking 8x tramadol daily to manage this. He also now wears a back support belt all day to help manage pain. He has had some constipation w/ pain meds.  3.2.2021: Here today for follow-up. He  reports that he has been having good weeks and bad weeks with regards to his urinary sx's. He still is c/o a sense of incomplete emptying. He is still on 1x tamsulosin and is interested in increasing his dosage. Retrospectively, he is not too sure his urolift was all that effective for him. Of note to him, his sx's do seem to improve somewhat after a BM -- he is on a bowel regimen of high fiber intake.   He continues to use spinal stimulator and daily tramadol for his back pain.   Past Medical History:  Diagnosis Date  . AAA (abdominal aortic aneurysm) ([place])   . Back pain    decompression belt bought off of TV  . CAD (coronary artery disease)   . Carotid artery disease ([place])    a. duplex  2015 - 1-39% bilaterally.  . Carotid bruit    November, 2013  . Cataract    bilateral - surgery to remove  . DDD (degenerative disc disease), lumbar    hands  . Dyslipidemia   . GERD (gastroesophageal reflux disease)   . History of kidney stones   . HTN (hypertension)   . Hx of CABG    2004  . MGUS (monoclonal gammopathy of unknown significance)   . Myocardial infarction ([place])   . 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 Spinal stenosis, lumbar    3-4  . Thrombocytopenia ([place])    Dr. Ralene Ok    Past Surgical History:  Procedure Laterality Date  . BACK SURGERY    . COLONOSCOPY    . COLONOSCOPY WITH PROPOFOL N/A 07/13/2017   Procedure: COLONOSCOPY WITH PROPOFOL;  Surgeon: Manus Gunning, MD;  [facility];  Service: Gastroenterology;  Laterality: N/A;  . CORONARY ARTERY BYPASS GRAFT  2004  . CYSTOSCOPY WITH INSERTION OF UROLIFT N/A 12/14/2018   Procedure: CYSTOSCOPY WITH INSERTION OF UROLIFT;  Surgeon: Franchot Gallo, MD;  [facility];  Service: Urology;  Laterality: N/A;  . DECOMPRESSIVE LUMBAR LAMINECTOMY LEVEL 1 N/A 09/04/2016   Procedure: DECOMPRESSION L3 - L4;  Surgeon: Melina Schools, MD;  Location: [facility];  Service: Orthopedics;  Laterality: N/A;  . EYE SURGERY  2013   bilateral cataract extraction w/ IOL (Dr. Celene Squibb), retina surgery  . JOINT REPLACEMENT    . LUMBAR LAMINECTOMY/DECOMPRESSION MICRODISCECTOMY  09/04/2016   L3-4  . SPINAL CORD STIMULATOR INSERTION N/A 05/13/2019   Procedure: LUMBAR SPINAL CORD STIMULATOR INSERTION;  Surgeon: Clydell Hakim, MD;  Location: [place];  Service: Neurosurgery;  Laterality: N/A;  LUMBAR SPINAL CORD STIMULATOR INSERTION  . TOTAL HIP ARTHROPLASTY Right 10/2003    Home Medications:  Allergies as of 08/14/2020      Reactions   Penicillins Other (See Comments)   PATIENT HAS HAD A PCN REACTION WITH IMMEDIATE RASH, FACIAL/TONGUE/THROAT SWELLING, SOB, OR LIGHTHEADEDNESS WITH  HYPOTENSION:   #  YES  #    Has patient had a PCN reaction causing severe rash involving mucus membranes or skin necrosis: No Has patient had a PCN reaction that required hospitalization No Has patient had a PCN reaction occurring within the last 10 years: No If all of the above answers are "NO", then may proceed with Cephalosporin use. Passed out      Medication List       Accurate as of August 14, 2020 10:36 AM. If you have any questions, ask your nurse or doctor.        aspirin 81 MG tablet  Take 1 tablet (81 mg total) by mouth daily.   betamethasone valerate ointment 0.1 % Commonly known as: VALISONE Apply 1 application topically 2 (two) times daily.   CENTRUM SILVER ULTRA MENS PO Take 1 tablet by mouth daily.   docusate sodium 50 MG capsule Commonly known as: COLACE Take 150 mg by mouth at bedtime.   esomeprazole 20 MG capsule Commonly known as: NEXIUM Take 20 mg by mouth daily at 12 noon.   ipratropium 0.03 % nasal spray Commonly known as: ATROVENT Place 2 sprays into both nostrils every 12 (twelve) hours.   lisinopril 40 MG tablet Commonly known as: ZESTRIL TAKE 1 TABLET BY MOUTH EVERY DAY   metoprolol tartrate 50 MG tablet Commonly known as: LOPRESSOR Take 1 tablet (50 mg total) by mouth 2 (two) times daily.   montelukast 10 MG tablet Commonly known as: SINGULAIR TAKE 1 TABLET BY MOUTH EVERYDAY AT BEDTIME   nitroGLYCERIN 0.4 MG SL tablet Commonly known as: NITROSTAT Place 1 tablet (0.4 mg total) under the tongue every 5 (five) minutes as needed for chest pain.   simvastatin 40 MG tablet Commonly known as: ZOCOR Take 1 tablet (40 mg total) by mouth at bedtime.   sulfamethoxazole-trimethoprim 800-160 MG tablet Commonly known as: BACTRIM DS sulfamethoxazole 800 mg-trimethoprim 160 mg tablet  TAKE 1 TABLET BY MOUTH TWICE A DAY   tamsulosin 0.4 MG Caps capsule Commonly known as: FLOMAX Take 2 capsules (0.8 mg total) by mouth daily.   traMADol 50  MG tablet Commonly known as: ULTRAM tramadol 50 mg tablet  Take 2 tablets 4 times a day by oral route as needed.       Allergies:  Allergies  Allergen Reactions  . Penicillins Other (See Comments)    PATIENT HAS HAD A PCN REACTION WITH IMMEDIATE RASH, FACIAL/TONGUE/THROAT SWELLING, SOB, OR LIGHTHEADEDNESS WITH HYPOTENSION:   #  YES  #    Has patient had a PCN reaction causing severe rash involving mucus membranes or skin necrosis: No Has patient had a PCN reaction that required hospitalization No Has patient had a PCN reaction occurring within the last 10 years: No If all of the above answers are "NO", then may proceed with Cephalosporin use.  Passed out    Family History  Problem Relation Age of Onset  . Kidney disease Mother   . COPD Mother   . Stroke Father        cerebral hemorrhage  . Heart disease Brother   . Heart disease Brother   . Arthritis Brother   . Cancer Paternal Grandfather     Social History:  reports that he has been smoking cigarettes. He has a 32.50 pack-year smoking history. He has never used smokeless tobacco. He reports previous alcohol use. He reports that he does not use drugs.  ROS: A complete review of systems was performed.  All systems are negative except for pertinent findings as noted.  Physical Exam:  Vital signs in last 24 hours: There were no vitals taken for this visit. Constitutional:  Alert and oriented, No acute distress Cardiovascular: Regular rate  Respiratory: Normal respiratory effort Genitourinary: Normal male phallus, testes are descended bilaterally and non-tender and without masses, scrotum is normal in appearance without lesions or masses, perineum is normal on inspection. Lymphatic: No lymphadenopathy Neurologic: Grossly intact, no focal deficits Psychiatric: Normal mood and affect  I have reviewed prior pt notes  I have reviewed notes from referring/previous physicians  I have reviewed urinalysis results  I have  reviewed prior PSA results  Cystoscopy Procedure Note:  Indication: Evaluation of recurrent lower urinary tract symptoms  After informed consent and discussion of the procedure and its risks, [name] [name] was positioned and prepped in the standard fashion.  Cystoscopy was performed with a flexible cystoscope.   Findings: Urethra: No stricture Prostate: Still with obstruction.  Evidence of prior UroLift but there are obstructing lateral lobes.  Prostatic urethra not elongated Bladder neck: Normal, nonobstructive Ureteral orifices: Normal Bladder: Moderate trabeculations.  No urothelial abnormalities  The patient tolerated the procedure well.      Impression/Assessment:  He does have recurrent obstruction following UroLift approximately 2 years ago.  He is amenable to repeat procedure  Plan:  1. Pt advised regarding f/u treatment for BPH and remaining options: cysto and urolift revision or TURP.  2.  He would like to proceed with UroLift.  We will see about doing this here in [place] at [facility]

## 2020-08-14 NOTE — Progress Notes (Signed)
Uroflow  Peak Flow: 37ml Average Flow: 66ml Voided Volume: 336ml Voiding Time: 60sec Flow Time: 59sec Time to Peak Flow: 14sec  Obstructive flow curve

## 2020-08-29 ENCOUNTER — Other Ambulatory Visit: Payer: Self-pay | Admitting: Internal Medicine

## 2020-08-30 ENCOUNTER — Encounter (HOSPITAL_COMMUNITY): Payer: Self-pay

## 2020-08-30 ENCOUNTER — Other Ambulatory Visit: Payer: Self-pay

## 2020-08-31 ENCOUNTER — Other Ambulatory Visit (HOSPITAL_COMMUNITY)
Admission: RE | Admit: 2020-08-31 | Discharge: 2020-08-31 | Disposition: A | Discharge status: Home / Self Care | Payer: PPO | Source: Ambulatory Visit | Attending: Urology | Admitting: Urology

## 2020-08-31 ENCOUNTER — Encounter (HOSPITAL_COMMUNITY)
Admission: RE | Admit: 2020-08-31 | Discharge: 2020-08-31 | Disposition: A | Discharge status: Home / Self Care | Payer: PPO | Source: Ambulatory Visit | Attending: Urology | Admitting: Urology

## 2020-08-31 DIAGNOSIS — Z01812 Encounter for preprocedural laboratory examination: Secondary | ICD-10-CM | POA: Insufficient documentation

## 2020-08-31 DIAGNOSIS — Z20822 Contact with and (suspected) exposure to covid-19: Secondary | ICD-10-CM | POA: Diagnosis not present

## 2020-09-01 LAB — SARS CORONAVIRUS 2 (TAT 6-24 HRS): SARS Coronavirus 2: NEGATIVE

## 2020-09-03 ENCOUNTER — Other Ambulatory Visit: Payer: Self-pay | Admitting: Urology

## 2020-09-03 DIAGNOSIS — D472 Monoclonal gammopathy: Secondary | ICD-10-CM

## 2020-09-04 ENCOUNTER — Other Ambulatory Visit: Payer: Self-pay

## 2020-09-04 ENCOUNTER — Encounter (HOSPITAL_COMMUNITY): Payer: Self-pay | Admitting: Urology

## 2020-09-04 ENCOUNTER — Ambulatory Visit (HOSPITAL_COMMUNITY): Payer: PPO | Admitting: Anesthesiology

## 2020-09-04 ENCOUNTER — Encounter (HOSPITAL_COMMUNITY)
Admission: RE | Disposition: A | Discharge status: Home / Self Care | Payer: Self-pay | Source: Home / Self Care | Attending: Urology

## 2020-09-04 ENCOUNTER — Ambulatory Visit (HOSPITAL_COMMUNITY)
Admission: RE | Admit: 2020-09-04 | Discharge: 2020-09-04 | Disposition: A | Discharge status: Home / Self Care | Payer: PPO | Attending: Urology | Admitting: Urology

## 2020-09-04 DIAGNOSIS — N401 Enlarged prostate with lower urinary tract symptoms: Secondary | ICD-10-CM | POA: Diagnosis not present

## 2020-09-04 DIAGNOSIS — F17210 Nicotine dependence, cigarettes, uncomplicated: Secondary | ICD-10-CM | POA: Insufficient documentation

## 2020-09-04 DIAGNOSIS — I714 Abdominal aortic aneurysm, without rupture: Secondary | ICD-10-CM | POA: Diagnosis not present

## 2020-09-04 DIAGNOSIS — I739 Peripheral vascular disease, unspecified: Secondary | ICD-10-CM | POA: Diagnosis not present

## 2020-09-04 DIAGNOSIS — Z96641 Presence of right artificial hip joint: Secondary | ICD-10-CM | POA: Insufficient documentation

## 2020-09-04 DIAGNOSIS — I252 Old myocardial infarction: Secondary | ICD-10-CM | POA: Insufficient documentation

## 2020-09-04 DIAGNOSIS — N138 Other obstructive and reflux uropathy: Secondary | ICD-10-CM | POA: Insufficient documentation

## 2020-09-04 DIAGNOSIS — I10 Essential (primary) hypertension: Secondary | ICD-10-CM | POA: Insufficient documentation

## 2020-09-04 DIAGNOSIS — I2510 Atherosclerotic heart disease of native coronary artery without angina pectoris: Secondary | ICD-10-CM | POA: Insufficient documentation

## 2020-09-04 DIAGNOSIS — Z951 Presence of aortocoronary bypass graft: Secondary | ICD-10-CM | POA: Diagnosis not present

## 2020-09-04 DIAGNOSIS — N139 Obstructive and reflux uropathy, unspecified: Secondary | ICD-10-CM | POA: Diagnosis not present

## 2020-09-04 SURGERY — CYSTOSCOPY WITH INSERTION OF UROLIFT
Anesthesia: General

## 2020-09-04 MED ORDER — LIDOCAINE 2% (20 MG/ML) 5 ML SYRINGE
INTRAMUSCULAR | Status: AC
  Filled 2020-09-04: qty 5

## 2020-09-04 MED ORDER — PROPOFOL 10 MG/ML IV BOLUS
INTRAVENOUS | Status: AC
  Filled 2020-09-04: qty 20

## 2020-09-04 MED ORDER — HYDROMORPHONE HCL 1 MG/ML IJ SOLN: 0.2500 mg | INTRAMUSCULAR | Status: DC | PRN

## 2020-09-04 MED ORDER — PHENYLEPHRINE 40 MCG/ML (10ML) SYRINGE FOR IV PUSH (FOR BLOOD PRESSURE SUPPORT)
PREFILLED_SYRINGE | INTRAVENOUS | Status: AC
  Filled 2020-09-04: qty 10

## 2020-09-04 MED ORDER — EPHEDRINE 5 MG/ML INJ
INTRAVENOUS | Status: AC
  Filled 2020-09-04: qty 10

## 2020-09-04 MED ORDER — LIDOCAINE HCL URETHRAL/MUCOSAL 2 % EX GEL
CUTANEOUS | Status: AC
  Filled 2020-09-04: qty 10

## 2020-09-04 MED ORDER — DEXAMETHASONE SODIUM PHOSPHATE 10 MG/ML IJ SOLN
INTRAMUSCULAR | Status: AC
  Filled 2020-09-04: qty 1

## 2020-09-04 MED ORDER — CHLORHEXIDINE GLUCONATE 0.12 % MT SOLN
15.0000 mL | Freq: Once | OROMUCOSAL | Status: AC
  Administered 2020-09-04: 15 mL via OROMUCOSAL

## 2020-09-04 MED ORDER — LIDOCAINE 2% (20 MG/ML) 5 ML SYRINGE
INTRAMUSCULAR | Status: DC | PRN
  Administered 2020-09-04 (×2): 50 mg via INTRAVENOUS

## 2020-09-04 MED ORDER — ONDANSETRON HCL 4 MG/2ML IJ SOLN
INTRAMUSCULAR | Status: DC | PRN
  Administered 2020-09-04: 4 mg via INTRAVENOUS

## 2020-09-04 MED ORDER — ONDANSETRON HCL 4 MG/2ML IJ SOLN
INTRAMUSCULAR | Status: AC
  Filled 2020-09-04: qty 2

## 2020-09-04 MED ORDER — CIPROFLOXACIN IN D5W 400 MG/200ML IV SOLN
INTRAVENOUS | Status: AC
  Filled 2020-09-04: qty 200

## 2020-09-04 MED ORDER — GLYCOPYRROLATE PF 0.2 MG/ML IJ SOSY
PREFILLED_SYRINGE | INTRAMUSCULAR | Status: AC
  Filled 2020-09-04: qty 1

## 2020-09-04 MED ORDER — FENTANYL CITRATE (PF) 100 MCG/2ML IJ SOLN
INTRAMUSCULAR | Status: AC
  Filled 2020-09-04: qty 2

## 2020-09-04 MED ORDER — LACTATED RINGERS IV SOLN: Freq: Once | INTRAVENOUS | Status: AC

## 2020-09-04 MED ORDER — ORAL CARE MOUTH RINSE: 15.0000 mL | Freq: Once | OROMUCOSAL | Status: AC

## 2020-09-04 MED ORDER — LIDOCAINE HCL 2 % EX GEL
CUTANEOUS | Status: DC | PRN
  Administered 2020-09-04: 1 via URETHRAL

## 2020-09-04 MED ORDER — LACTATED RINGERS IV SOLN: INTRAVENOUS | Status: DC | PRN

## 2020-09-04 MED ORDER — EPHEDRINE SULFATE-NACL 50-0.9 MG/10ML-% IV SOSY
PREFILLED_SYRINGE | INTRAVENOUS | Status: DC | PRN
  Administered 2020-09-04: 5 mg via INTRAVENOUS

## 2020-09-04 MED ORDER — DEXAMETHASONE SODIUM PHOSPHATE 4 MG/ML IJ SOLN
INTRAMUSCULAR | Status: DC | PRN
  Administered 2020-09-04: 6 mg via INTRAVENOUS

## 2020-09-04 MED ORDER — WATER FOR IRRIGATION, STERILE IR SOLN
Status: DC | PRN
  Administered 2020-09-04: 500 mL
  Administered 2020-09-04: 3000 mL

## 2020-09-04 MED ORDER — PROPOFOL 10 MG/ML IV BOLUS
INTRAVENOUS | Status: DC | PRN
  Administered 2020-09-04: 20 mg via INTRAVENOUS
  Administered 2020-09-04: 30 mg via INTRAVENOUS

## 2020-09-04 MED ORDER — CIPROFLOXACIN IN D5W 400 MG/200ML IV SOLN
400.0000 mg | INTRAVENOUS | Status: AC
  Administered 2020-09-04: 400 mg via INTRAVENOUS

## 2020-09-04 MED ORDER — ONDANSETRON HCL 4 MG/2ML IJ SOLN: 4.0000 mg | Freq: Once | INTRAMUSCULAR | Status: DC | PRN

## 2020-09-04 MED ORDER — FENTANYL CITRATE (PF) 100 MCG/2ML IJ SOLN
INTRAMUSCULAR | Status: DC | PRN
Start: 2020-09-04 — End: 2020-09-04
  Administered 2020-09-04 (×2): 50 ug via INTRAVENOUS

## 2020-09-04 MED ORDER — DOXYCYCLINE HYCLATE 100 MG PO TABS: 100.0000 mg | ORAL_TABLET | Freq: Two times a day (BID) | ORAL | 0 refills | Status: DC

## 2020-09-04 MED ORDER — PROPOFOL 500 MG/50ML IV EMUL
INTRAVENOUS | Status: DC | PRN
  Administered 2020-09-04: 75 ug/kg/min via INTRAVENOUS

## 2020-09-04 SURGICAL SUPPLY — 20 items
BAG DRAIN URO TABLE W/ADPT NS (BAG) ×2 IMPLANT
BAG DRN 8 ADPR NS SKTRN CSTL (BAG) ×1
BAG URINE LEG 500ML (DRAIN) ×1 IMPLANT
CATH FOLEY 2WAY SLVR  5CC 18FR (CATHETERS) ×2
CATH FOLEY 2WAY SLVR 5CC 18FR (CATHETERS) IMPLANT
CLOTH BEACON ORANGE TIMEOUT ST (SAFETY) ×2 IMPLANT
GLOVE BIO SURGEON STRL SZ8 (GLOVE) ×2 IMPLANT
GLOVE BIOGEL PI IND STRL 7.0 (GLOVE) ×2 IMPLANT
GLOVE BIOGEL PI INDICATOR 7.0 (GLOVE) ×3
GLOVE ECLIPSE 6.5 STRL STRAW (GLOVE) ×2 IMPLANT
GOWN STRL REUS W/TWL LRG LVL3 (GOWN DISPOSABLE) ×3 IMPLANT
GOWN STRL REUS W/TWL XL LVL3 (GOWN DISPOSABLE) ×2 IMPLANT
KIT TURNOVER CYSTO (KITS) ×2 IMPLANT
MANIFOLD NEPTUNE II (INSTRUMENTS) ×2 IMPLANT
PACK CYSTO (CUSTOM PROCEDURE TRAY) ×2 IMPLANT
PAD ARMBOARD 7.5X6 YLW CONV (MISCELLANEOUS) ×2 IMPLANT
SYSTEM UROLIFT (Male Continence) ×7 IMPLANT
TOWEL OR 17X26 4PK STRL BLUE (TOWEL DISPOSABLE) ×2 IMPLANT
WATER STERILE IRR 3000ML UROMA (IV SOLUTION) ×2 IMPLANT
WATER STERILE IRR 500ML POUR (IV SOLUTION) ×2 IMPLANT

## 2020-09-04 NOTE — Anesthesia Postprocedure Evaluation (Signed)
Anesthesia Post Note  Patient: [name] [name]  Procedure(s) Performed: CYSTOSCOPY WITH INSERTION OF UROLIFT (N/A )  Patient location during evaluation: PACU Anesthesia Type: General Level of consciousness: oriented and awake and alert Pain management: pain level controlled Respiratory status: spontaneous breathing, respiratory function stable and nonlabored ventilation Cardiovascular status: blood pressure returned to baseline Postop Assessment: no headache and no apparent nausea or vomiting Anesthetic complications: no   No complications documented.   Last Vitals:  Vitals:   09/04/20 0649  BP: 138/80  Pulse: 68  Resp: 17  Temp: 36.8 C  SpO2: 96%    Last Pain:  Vitals:   09/04/20 0649  TempSrc: Oral  PainSc: 0-No pain                 Orlie Dakin

## 2020-09-04 NOTE — H&P (Signed)
H&P  Chief Complaint: BPH  History of Present Illness: 79  Yo male w/ h/o BPH, s/p prior Urolift presents for repeat procedure.  Past Medical History:  Diagnosis Date  . AAA (abdominal aortic aneurysm) ([place])   . Back pain    decompression belt bought off of TV  . CAD (coronary artery disease)   . Carotid artery disease ([place])    a. duplex 2015 - 1-39% bilaterally.  . Carotid bruit    November, 2013  . Cataract    bilateral - surgery to remove  . DDD (degenerative disc disease), lumbar    hands  . Dyslipidemia   . GERD (gastroesophageal reflux disease)   . History of kidney stones   . HTN (hypertension)   . Hx of CABG    2004  . MGUS (monoclonal gammopathy of unknown significance)   . Myocardial infarction ([place])   . 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 Spinal stenosis, lumbar    3-4  . Thrombocytopenia ([place])    Dr. Ralene Ok    Past Surgical History:  Procedure Laterality Date  . BACK SURGERY    . COLONOSCOPY    . COLONOSCOPY WITH PROPOFOL N/A 07/13/2017   Procedure: COLONOSCOPY WITH PROPOFOL;  Surgeon: Manus Gunning, MD;  [facility];  Service: Gastroenterology;  Laterality: N/A;  . CORONARY ARTERY BYPASS GRAFT  2004  . CYSTOSCOPY WITH INSERTION OF UROLIFT N/A 12/14/2018   Procedure: CYSTOSCOPY WITH INSERTION OF UROLIFT;  Surgeon: Franchot Gallo, MD;  [facility];  Service: Urology;  Laterality: N/A;  . DECOMPRESSIVE LUMBAR LAMINECTOMY LEVEL 1 N/A 09/04/2016   Procedure: DECOMPRESSION L3 - L4;  Surgeon: Melina Schools, MD;  Location: [place];  Service: Orthopedics;  Laterality: N/A;  . EYE SURGERY  2013   bilateral cataract extraction w/ IOL (Dr. Celene Squibb), retina surgery  . JOINT REPLACEMENT    . LUMBAR LAMINECTOMY/DECOMPRESSION MICRODISCECTOMY  09/04/2016   L3-4  . SPINAL CORD STIMULATOR INSERTION N/A 05/13/2019   Procedure: LUMBAR SPINAL CORD STIMULATOR INSERTION;  Surgeon: Clydell Hakim, MD;  Location: [facility];   Service: Neurosurgery;  Laterality: N/A;  LUMBAR SPINAL CORD STIMULATOR INSERTION  . TOTAL HIP ARTHROPLASTY Right 10/2003    Home Medications:    Allergies:  Allergies  Allergen Reactions  . Penicillins Other (See Comments)    PATIENT HAS HAD A PCN REACTION WITH IMMEDIATE RASH, FACIAL/TONGUE/THROAT SWELLING, SOB, OR LIGHTHEADEDNESS WITH HYPOTENSION:   #  YES  #    Has patient had a PCN reaction causing severe rash involving mucus membranes or skin necrosis: No Has patient had a PCN reaction that required hospitalization No Has patient had a PCN reaction occurring within the last 10 years: No If all of the above answers are "NO", then may proceed with Cephalosporin use.  Passed out    Family History  Problem Relation Age of Onset  . Kidney disease Mother   . COPD Mother   . Stroke Father        cerebral hemorrhage  . Heart disease Brother   . Heart disease Brother   . Arthritis Brother   . Cancer Paternal Grandfather     Social History:  reports that he has been smoking cigarettes. He has a 32.50 pack-year smoking history. He has never used smokeless tobacco. He reports previous alcohol use. He reports that he does not use drugs.  ROS: A complete review of systems was performed.  All systems are negative except for  pertinent findings as noted.  Physical Exam:  Vital signs in last 24 hours: BP 138/80   Pulse 68   Temp 98.2 F (36.8 C) (Oral)   Resp 17   Ht 5\' 10"  (1.778 m)   Wt 78 kg   SpO2 96%   BMI 24.67 kg/m  Constitutional:  Alert and oriented, No acute distress Cardiovascular: Regular rate  Respiratory: Normal respiratory effort GI: Abdomen is soft, nontender, nondistended, no abdominal masses. No CVAT.  Genitourinary: Normal male phallus, testes are descended bilaterally and non-tender and without masses, scrotum is normal in appearance without lesions or masses, perineum is normal on inspection. Lymphatic: No lymphadenopathy Neurologic: Grossly intact, no  focal deficits Psychiatric: Normal mood and affect  Laboratory Data:  No results for input(s): WBC, HGB, HCT, PLT in the last 72 hours.  No results for input(s): NA, K, CL, GLUCOSE, BUN, CALCIUM, CREATININE in the last 72 hours.  Invalid input(s): CO3   No results found for this or any previous visit (from the past 24 hour(s)). Recent Results (from the past 240 hour(s))  SARS CORONAVIRUS 2 (TAT 6-24 HRS) Nasopharyngeal Nasopharyngeal Swab     Status: None   Collection Time: 08/31/20  2:41 PM   Specimen: Nasopharyngeal Swab  Result Value Ref Range Status   SARS Coronavirus 2 NEGATIVE NEGATIVE Final    Comment: (NOTE) SARS-CoV-2 target nucleic acids are NOT DETECTED.  The SARS-CoV-2 RNA is generally detectable in upper and lower respiratory specimens during the acute phase of infection. Negative results do not preclude SARS-CoV-2 infection, do not rule out co-infections with other pathogens, and should not be used as the sole basis for treatment or other patient management decisions. Negative results must be combined with clinical observations, patient history, and epidemiological information. The expected result is Negative.  Fact Sheet for Patients: SugarRoll.be  Fact Sheet for Healthcare Providers: https://www.woods-mathews.com/  This test is not yet approved or cleared by the [place] FDA and  has been authorized for detection and/or diagnosis of SARS-CoV-2 by FDA under an Emergency Use Authorization (EUA). This EUA will remain  in effect (meaning this test can be used) for the duration of the COVID-19 declaration under Se ction 564(b)(1) of the Act, 21 U.S.C. section 360bbb-3(b)(1), unless the authorization is terminated or revoked sooner.  Performed at [facility], [place] [address], [place], [place] [address]     Renal Function: No results for input(s): CREATININE in the last 168 hours. CrCl cannot be calculated  (Patient's most recent lab result is older than the maximum 21 days allowed.).  Radiologic Imaging: No results found.  Impression/Assessment:  BPH w/ obstructive sx's  Plan:  Urolift

## 2020-09-04 NOTE — Op Note (Signed)
Preoperative diagnosis: BPH with obstructive symptomatology.  Postoperative diagnosis: Same  Principal procedure: Urolift procedure, with the placement of 7 implants.  Surgeon: Diona Fanti  Anesthesia: MAC  Complications: None  Drains: 18 French Foley catheter, to leg bag.  Estimated blood loss: Less than 25 mL  Indications: 79 year old male with obstructive symptomatology secondary to BPH.  He has had a UroLift approximately 2 years ago, but has recurrent symptoms and, cystoscopically, coapting lateral lobes.  The patient's symptoms have progressed, and he has requested further management.  Management options including TURP with resection/ablation of the prostate as well as Urolift were discussed.  The patient has chosen to have a Urolift procedure.  He has been instructed to the procedure as well as risks and complications which include but are not limited to infection, bleeding, and inadequate treatment with the Urolift procedure alone, anesthetic complications, among others.  He understands these and desires to proceed.  Findings: Using the 17 French cystoscope, urethra and bladder were inspected.  There were no urethral lesions.  Prostatic urethra was obstructed secondary to bilobar hypertrophy.  The bladder was inspected circumferentially.  This revealed normal findings.  Description of procedure: The patient was properly identified in the holding area.  He received preoperative IV antibiotics.  He was taken to the operating room where general anesthetic was administered with the LMA.  He is placed in the dorsolithotomy position.  Genitalia and perineum were prepped and draped.  Proper timeout was performed.  A 41F cystoscope was inserted into the bladder. The cystoscopy bridge was replaced with a UroLift delivery device.The first treatment site was the patient's right side approximately 1.5cm distal to the bladder neck. The distal tip of the delivery device was then angled laterally  approximately 20 degrees at this position to compress the lateral lobe. The trigger was pulled, thereby deploying a needle containing the implant through the prostate. The needle was then retracted, allowing one end of the implant to be delivered to the capsular surface of the prostate. The implant was then tensioned to assure capsular seating and removal of slack monofilament. The device was then angled back toward midline and slowly advanced proximally until cystoscopic verification of the monofilament being centered in the delivery bay. The urethral end piece was then affixed to the monofilament thereby tailoring the size of the implant. Excess filament was then severed. The delivery device was then re-advanced into the bladder. The delivery device was then replaced with cystoscope and bridge and the implant location and opening effect was confirmed cystoscopically. The same procedure was then repeated on the left side, and 2 additional implants were delivered just proximal to the verumontanum, again one on right and one on left side of the prostate, following the same technique.  3 additional implants were delivered to the mid urethra, directed more anterior laterally.  1 of these was placed in the right mid urethra, 2 on the left.   A final cystoscopy was conducted first to inspect the location and state of each implant and second, to confirm the presence of a continuous anterior channel was present through the prostatic urethra with irrigation flow turned off.  7 implants were delivered in total.  Following this, the scope was removed and a 18 French Foley catheter was placed and hooked to dependent drainage.  He was then awakened and taken to the PACU in stable condition.  He tolerated the procedure well.

## 2020-09-04 NOTE — Anesthesia Procedure Notes (Signed)
Procedure Name: General with mask airway Date/Time: 09/04/2020 7:44 AM Performed by: Orlie Dakin, CRNA Pre-anesthesia Checklist: Patient identified, Emergency Drugs available, Suction available and Patient being monitored Oxygen Delivery Method: Non-rebreather mask Preoxygenation: Pre-oxygenation with 100% oxygen Induction Type: IV induction Placement Confirmation: positive ETCO2

## 2020-09-04 NOTE — Transfer of Care (Signed)
Immediate Anesthesia Transfer of Care Note  Patient: [name] [name]  Procedure(s) Performed: CYSTOSCOPY WITH INSERTION OF UROLIFT (N/A )  Patient Location: PACU  Anesthesia Type:General  Level of Consciousness: awake, alert  and oriented  Airway & Oxygen Therapy: Patient Spontanous Breathing  Post-op Assessment: Report given to RN and Post -op Vital signs reviewed and stable  Post vital signs: Reviewed and stable  Last Vitals:  Vitals Value Taken Time  BP 127/78 09/04/20 0801  Temp    Pulse 65 09/04/20 0803  Resp 13 09/04/20 0803  SpO2 98 % 09/04/20 0803  Vitals shown include unvalidated device data.  Last Pain:  Vitals:   09/04/20 0649  TempSrc: Oral  PainSc: 0-No pain      Patients Stated Pain Goal: 7 (96/43/83 8184)  Complications: No complications documented.

## 2020-09-04 NOTE — Anesthesia Preprocedure Evaluation (Signed)
Anesthesia Evaluation  Patient identified by MRN, date of birth, ID band Patient awake    Reviewed: Allergy & Precautions, NPO status , Patient's Chart, lab work & pertinent test results, reviewed documented beta blocker date and time   History of Anesthesia Complications Negative for: history of anesthetic complications  Airway Mallampati: II  TM Distance: >3 FB Neck ROM: Full    Dental  (+) Partial Upper, Dental Advisory Given   Pulmonary neg pulmonary ROS, Current SmokerPatient did not abstain from smoking.,    Pulmonary exam normal breath sounds clear to auscultation       Cardiovascular Exercise Tolerance: Good hypertension, Pt. on medications and Pt. on home beta blockers + CAD, + Past MI, + CABG and + Peripheral Vascular Disease (AAA)  Normal cardiovascular exam+ dysrhythmias  Rhythm:Regular Rate:Normal     Neuro/Psych negative neurological ROS  negative psych ROS   GI/Hepatic Neg liver ROS, GERD  ,  Endo/Other  negative endocrine ROS  Renal/GU negative Renal ROS     Musculoskeletal  (+) Arthritis ,  spinal cord stimulators back pain   Abdominal   Peds  Hematology negative hematology ROS (+)   Anesthesia Other Findings   Reproductive/Obstetrics negative OB ROS                            Anesthesia Physical Anesthesia Plan  ASA: III  Anesthesia Plan: General   Post-op Pain Management:    Induction: Intravenous  PONV Risk Score and Plan: 3 and Ondansetron  Airway Management Planned: LMA  Additional Equipment:   Intra-op Plan:   Post-operative Plan: Extubation in OR  Informed Consent: I have reviewed the patients History and Physical, chart, labs and discussed the procedure including the risks, benefits and alternatives for the proposed anesthesia with the patient or authorized representative who has indicated his/her understanding and acceptance.     Dental  advisory given  Plan Discussed with: CRNA and Surgeon  Anesthesia Plan Comments:         Anesthesia Quick Evaluation

## 2020-09-04 NOTE — Discharge Instructions (Signed)
1. You may see some blood in the urine and may have some burning with urination for 48-72 hours. You also may notice that you have to urinate more frequently or urgently after your procedure which is normal.  2. You should call should you develop an inability urinate, fever > 101, persistent nausea and vomiting that prevents you from eating or drinking to stay hydrated.  If you have a catheter, you will be taught how to take care of the catheter by the nursing staff prior to discharge from the hospital.  You may periodically feel a strong urge to void with the catheter in place.  This is a bladder spasm and most often can occur when having a bowel movement or moving around. It is typically self-limited and usually will stop after a few minutes.  You may use some Vaseline or Neosporin around the tip of the catheter to reduce friction at the tip of the penis. You may also see some blood in the urine.  A very small amount of blood can make the urine look quite red.  As long as the catheter is draining well, there usually is not a problem.  However, if the catheter is not draining well and is bloody, you should call the office [phone]) to notify us.  It is okay to remove the catheter as directed on Wednesday morning.  It is okay to wean off the Flomax over the next few days.  You can start back on your aspirin once the urine is free of blood

## 2020-09-05 ENCOUNTER — Encounter (HOSPITAL_COMMUNITY): Payer: Self-pay | Admitting: Urology

## 2020-09-17 DIAGNOSIS — H43812 Vitreous degeneration, left eye: Secondary | ICD-10-CM | POA: Diagnosis not present

## 2020-09-24 ENCOUNTER — Other Ambulatory Visit: Payer: Self-pay | Admitting: *Deleted

## 2020-09-24 ENCOUNTER — Ambulatory Visit: Payer: PPO | Attending: Internal Medicine

## 2020-09-24 DIAGNOSIS — I714 Abdominal aortic aneurysm, without rupture, unspecified: Secondary | ICD-10-CM

## 2020-09-24 DIAGNOSIS — Z23 Encounter for immunization: Secondary | ICD-10-CM

## 2020-09-24 NOTE — Progress Notes (Signed)
   Covid-19 Vaccination Clinic  Name:  BARCLAY LENNOX    MRN: [id number] DOB: 12-23-1940  09/24/2020  Mr. Hagin was observed post Covid-19 immunization for 15 minutes without incident. He was provided with Vaccine Information Sheet and instruction to access the V-Safe system.   Mr. Reffner was instructed to call 911 with any severe reactions post vaccine: Marland Kitchen Difficulty breathing  . Swelling of face and throat  . A fast heartbeat  . A bad rash all over body  . Dizziness and weakness

## 2020-10-06 DIAGNOSIS — M961 Postlaminectomy syndrome, not elsewhere classified: Secondary | ICD-10-CM | POA: Diagnosis not present

## 2020-10-06 DIAGNOSIS — G894 Chronic pain syndrome: Secondary | ICD-10-CM | POA: Diagnosis not present

## 2020-10-06 DIAGNOSIS — M5136 Other intervertebral disc degeneration, lumbar region: Secondary | ICD-10-CM | POA: Diagnosis not present

## 2020-10-09 ENCOUNTER — Ambulatory Visit (HOSPITAL_COMMUNITY)
Admission: RE | Admit: 2020-10-09 | Discharge: 2020-10-09 | Disposition: A | Discharge status: Home / Self Care | Payer: PPO | Source: Ambulatory Visit | Attending: Physician Assistant | Admitting: Physician Assistant

## 2020-10-09 ENCOUNTER — Ambulatory Visit: Payer: PPO | Admitting: Physician Assistant

## 2020-10-09 ENCOUNTER — Other Ambulatory Visit: Payer: Self-pay

## 2020-10-09 VITALS — BP 156/84 | HR 70 | Temp 98.1°F | Resp 20 | Ht 70.0 in | Wt 169.3 lb

## 2020-10-09 DIAGNOSIS — I714 Abdominal aortic aneurysm, without rupture, unspecified: Secondary | ICD-10-CM

## 2020-10-09 NOTE — Progress Notes (Signed)
Office Note     CC:  follow up Requesting Provider:  Hoyt Koch, *  HPI: [name] [name] is a 79 y.o. ([birth year]) male who presents for surveillance follow up of AAA. Initially found incidentally. He has had no symptoms associated with aneurysm. He was last seen 6 months ago.  Today he denies any new back pain, no abdominal pain, nausea, vomiting, changes in his bowels or trouble urinating. He does have chronic constipation secondary to chronic pain medications. He denies any lower extremity weakness or claudication symptoms. He does not have any rest pain or problems with wound healing  He has hx of multiple back procedures. He has a nerve stimulator implant in back and is also goes to pain management for his back. This continues to be his biggest complaint. It is very disabling at times. He continues to use cane to ambulate. He was previously swimming which was helpful for his back but he has stopped recently due to limitations on pool availability with COVID.   The pt on a statin for cholesterol management.  The pt on a daily aspirin.   Other AC:  none The pt  on metoprolol  And lisinopril for hypertension.   The pt is not diabetic.  Tobacco hx:  Current smoker, 1/2 ppd  Past Medical History:  Diagnosis Date   AAA (abdominal aortic aneurysm) (HCC)    Back pain    decompression belt bought off of TV   CAD (coronary artery disease)    Carotid artery disease ([place])    a. duplex 2015 - 1-39% bilaterally.   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 Ralene Ok    Past Surgical History:  Procedure Laterality Date   BACK SURGERY     COLONOSCOPY     COLONOSCOPY WITH PROPOFOL N/A 07/13/2017   Procedure: COLONOSCOPY WITH PROPOFOL;  Surgeon: Manus Gunning, MD;  Location: [facility];  Service: Gastroenterology;  Laterality: N/A;   CORONARY ARTERY BYPASS GRAFT  2004   CYSTOSCOPY WITH INSERTION OF UROLIFT N/A 12/14/2018   Procedure: CYSTOSCOPY WITH INSERTION OF UROLIFT;  Surgeon: Franchot Gallo, MD;  Location: [facility];  Service: Urology;  Laterality: N/A;   CYSTOSCOPY WITH INSERTION OF UROLIFT N/A 09/04/2020   Procedure: CYSTOSCOPY WITH INSERTION OF UROLIFT;  Surgeon: Franchot Gallo, MD;  Location: [facility];  Service: Urology;  Laterality: N/A;   DECOMPRESSIVE LUMBAR LAMINECTOMY LEVEL 1 N/A 09/04/2016   Procedure: DECOMPRESSION L3 - L4;  Surgeon: Melina Schools, MD;  Location: [facility];  Service: Orthopedics;  Laterality: N/A;   EYE SURGERY  2013   bilateral cataract extraction w/ IOL (Dr. Celene Squibb), retina surgery   JOINT REPLACEMENT     LUMBAR LAMINECTOMY/DECOMPRESSION MICRODISCECTOMY  09/04/2016   L3-4   SPINAL CORD STIMULATOR INSERTION N/A 05/13/2019   Procedure: LUMBAR SPINAL CORD STIMULATOR INSERTION;  Surgeon: Clydell Hakim, MD;  Location: [facility];  Service: Neurosurgery;  Laterality: N/A;  LUMBAR SPINAL CORD STIMULATOR INSERTION   TOTAL HIP ARTHROPLASTY Right 10/2003    Social History   Socioeconomic History   Marital status: Married    Spouse name: Not on file   Number of children: 2   Years of education: 12   Highest education level: Not on file  Occupational History   Occupation: trucking     Employer: RETIRED  Tobacco Use   Smoking status: Heavy Tobacco Smoker    Packs/day: 0.50    Years: 65.00    Pack years: 32.50    Types: Cigarettes   Smokeless tobacco: Never Used   Tobacco comment: smoking since age 63 yrs old  Vaping Use   Vaping Use: Never used    Substance and Sexual Activity   Alcohol use: Not Currently   Drug use: No   Sexual activity: Not Currently  Other Topics Concern   Not on file  Social History Narrative   HSG, Married '62, 1 son-'70, 1 dtr - '68; 2 grandchildren. Work - Probation officer.   ACP - DNR, DNI, no artificial hydration or feeding, no heroic measures   Social Determinants of Health   Financial Resource Strain:    Difficulty of Paying Living Expenses: Not on file  Food Insecurity:    Worried About Charity fundraiser in the Last Year: Not on file   [employer] of Food in the Last Year: Not on file  Transportation Needs:    Lack of Transportation (Medical): Not on file   Lack of Transportation (Non-Medical): Not on file  Physical Activity: Unknown   Days of Exercise per Week: Patient refused   Minutes of Exercise per Session: Patient refused  Stress:    Feeling of Stress : Not on file  Social Connections:    Frequency of Communication with Friends and Family: Not on file   Frequency of Social Gatherings with Friends and Family: Not on file   Attends Religious Services: Not on file   Active Member of Clubs or Organizations: Not on file   Attends Archivist Meetings: Not on file   Marital Status: Not on file  Intimate Partner Violence: Not At Risk   Fear of Current or Ex-Partner: No   Emotionally Abused: No   Physically Abused: No   Sexually Abused: No    Family History  Problem Relation Age of Onset   Kidney disease Mother    COPD Mother    Stroke Father        cerebral hemorrhage   Heart disease Brother    Heart disease Brother    Arthritis Brother    Cancer Paternal Grandfather     Current Outpatient Medications  Medication Sig Dispense Refill   aspirin 81 MG tablet Take 1 tablet (81 mg total) by mouth daily. 90 tablet 3   betamethasone valerate ointment (VALISONE) 0.1 % Apply 1 application topically 2 (two) times daily. 452 g 2   docusate  sodium (COLACE) 50 MG capsule Take 50 mg by mouth at bedtime.      doxycycline (VIBRA-TABS) 100 MG tablet Take 1 tablet (100 mg total) by mouth 2 (two) times daily. 6 tablet 0   esomeprazole (NEXIUM) 20 MG capsule Take 20 mg by mouth daily at 12 noon.     ipratropium (ATROVENT) 0.03 % nasal spray Place 2 sprays into both nostrils every 12 (twelve) hours. 30 mL 12   lisinopril (ZESTRIL) 40 MG tablet TAKE 1 TABLET BY MOUTH EVERY DAY (Patient taking differently: Take  40 mg by mouth daily. ) 90 tablet 1   metoprolol tartrate (LOPRESSOR) 50 MG tablet Take 1 tablet (50 mg total) by mouth 2 (two) times daily. 180 tablet 1   montelukast (SINGULAIR) 10 MG tablet TAKE 1 TABLET BY MOUTH EVERYDAY AT BEDTIME 90 tablet 0   Multiple Vitamins-Minerals (CENTRUM SILVER ULTRA MENS PO) Take 1 tablet by mouth daily.      nitroGLYCERIN (NITROSTAT) 0.4 MG SL tablet Place 1 tablet (0.4 mg total) under the tongue every 5 (five) minutes as needed for chest pain. 25 tablet 3   simvastatin (ZOCOR) 40 MG tablet Take 1 tablet (40 mg total) by mouth at bedtime. 90 tablet 1   tamsulosin (FLOMAX) 0.4 MG CAPS capsule Take 2 capsules (0.8 mg total) by mouth daily. 60 capsule 11   traMADol (ULTRAM) 50 MG tablet Take 50 mg by mouth every 4 (four) hours as needed for moderate pain.      No current facility-administered medications for this visit.    Allergies  Allergen Reactions   Penicillins Other (See Comments)    PATIENT HAS HAD A PCN REACTION WITH IMMEDIATE RASH, FACIAL/TONGUE/THROAT SWELLING, SOB, OR LIGHTHEADEDNESS WITH HYPOTENSION:   #  YES  #    Has patient had a PCN reaction causing severe rash involving mucus membranes or skin necrosis: No Has patient had a PCN reaction that required hospitalization No Has patient had a PCN reaction occurring within the last 10 years: No If all of the above answers are "NO", then may proceed with Cephalosporin use.  Passed out     REVIEW OF SYSTEMS:  [X]  denotes  positive finding, [ ]  denotes negative finding Cardiac  Comments:  Chest pain or chest pressure:    Shortness of breath upon exertion:    Short of breath when lying flat:    Irregular heart rhythm:        Vascular    Pain in calf, thigh, or hip brought on by ambulation:    Pain in feet at night that wakes you up from your sleep:     Blood clot in your veins:    Leg swelling:         Pulmonary    Oxygen at home:    Productive cough:     Wheezing:         Neurologic    Sudden weakness in arms or legs:     Sudden numbness in arms or legs:     Sudden onset of difficulty speaking or slurred speech:    Temporary loss of vision in one eye:     Problems with dizziness:         Gastrointestinal    Blood in stool:     Vomited blood:         Genitourinary    Burning when urinating:     Blood in urine:        Psychiatric    Major depression:         Hematologic    Bleeding problems:    Problems with blood clotting too easily:        Skin    Rashes or ulcers:        Constitutional    Fever or chills:      PHYSICAL EXAMINATION:  There were no vitals filed for this visit.  General:  WDWN in NAD; vital signs documented above Gait: normal, uses cane HENT: WNL, normocephalic Pulmonary: normal non-labored breathing , without wheezing Cardiac: regular HR,  without  Murmurs without carotid bruit Abdomen: soft, NT, no masses. No palpable aneurysm Vascular Exam/Pulses:  Right Left  Radial 2+ (normal) 2+ (normal)  Femoral 2+ (normal) 2+ (normal)  Popliteal 2+ (normal) 2+ (normal)  DP 2+ (normal) 2+ (normal)  PT 1+ (weak) 1+ (weak)   Extremities: without ischemic changes, without Gangrene , without cellulitis; without open wounds;  Musculoskeletal: no muscle wasting or atrophy  Neurologic: A&O X 3;  No focal weakness or paresthesias are detected Psychiatric:  The pt has Normal affect.   Non-Invasive Vascular Imaging:   Abdominal Aorta Findings:    +-----------+-------+----------+----------+----------+--------+--------+   Location   AP (cm) Trans (cm) PSV (cm/s) Waveform  Thrombus Comments   +-----------+-------+----------+----------+----------+--------+--------+   Proximal   2.50   2.52                            +-----------+-------+----------+----------+----------+--------+--------+   Mid     2.67   2.96    90                       +-----------+-------+----------+----------+----------+--------+--------+   Distal    4.76   4.99    125                       +-----------+-------+----------+----------+----------+--------+--------+   RT CIA Prox 2.0   2.1     49     monophasic             +-----------+-------+----------+----------+----------+--------+--------+   LT CIA Prox 1.3   1.4     125     monophasic             +-----------+-------+----------+----------+----------+--------+--------+   Slight increase in the aortic diameter size from prior exam. Previous diameter 4.6 x 4.56 cm 01/30/20  ASSESSMENT/PLAN:: 80 y.o. male here for follow up for asymptomatic abdominal aortic aneurysm. His AAA is essentially unchanged from prior ultrasound. It has only very slightly increased from last ultrasound in 2/21  - AAA repair if he becomes symptomatic, size > 5.5 cm or > 1 cm growth in 1 year - Discussed importance of strict blood pressure control, smoking cessation, regular exercise - Advised him to go to the emergency room should he experience sudden onset of abdominal pain or back pain - He will continue his aspirin and zocor - He will follow up in 6 months with repeat AAA duplex   Karoline Caldwell, PA-C [facility] [phone]  Clinic MD: Dr. Carlis Abbott

## 2020-10-16 ENCOUNTER — Other Ambulatory Visit: Payer: Self-pay

## 2020-10-16 ENCOUNTER — Ambulatory Visit (INDEPENDENT_AMBULATORY_CARE_PROVIDER_SITE_OTHER): Payer: PPO | Admitting: Urology

## 2020-10-16 ENCOUNTER — Encounter: Payer: Self-pay | Admitting: Urology

## 2020-10-16 VITALS — BP 165/92 | HR 84 | Temp 98.5°F | Wt 170.0 lb

## 2020-10-16 DIAGNOSIS — R339 Retention of urine, unspecified: Secondary | ICD-10-CM

## 2020-10-16 DIAGNOSIS — N401 Enlarged prostate with lower urinary tract symptoms: Secondary | ICD-10-CM

## 2020-10-16 LAB — BLADDER SCAN AMB NON-IMAGING: Scan Result: 218

## 2020-10-16 NOTE — Progress Notes (Signed)
H&P  Chief Complaint: Post-op F/U of Urolift  History of Present Illness:   11.9.2021: Pt reports that for 3 days following his procedure, he had significant leakage and gross hematuria. His FOS has improved since his procedure, but notes persistent intermittency. He notes significant improvement in his LUTS in the last 10 days. He occasionally feels that he is unable to empty his bladder completely and has occasional constipation. Pt has discontinued tamsulosin.  IPSS Questionnaire (AUA-7): Over the past month.   1)  How often have you had a sensation of not emptying your bladder completely after you finish urinating?  3 - About half the time  2)  How often have you had to urinate again less than two hours after you finished urinating? 2 - Less than half the time  3)  How often have you found you stopped and started again several times when you urinated?  5 - Almost always  4) How difficult have you found it to postpone urination?  2 - Less than half the time  5) How often have you had a weak urinary stream?  2 - Less than half the time  6) How often have you had to push or strain to begin urination?  1 - Less than 1 time in 5  7) How many times did you most typically get up to urinate from the time you went to bed until the time you got up in the morning?  4 - 4 times  Total score:  0-7 mildly symptomatic   8-19 moderately symptomatic   20-35 severely symptomatic  QoL score: 3   (below copied from [facility] records):  BPH:  [name] [name] is a 79 year-old male established patient who is here for an enlarged prostate follow-up evaluation.  He is currently taking tamsulosin.   9.10.19--Seen for worsening lower urinary tract symptoms despite being on dual medical therapy. Residual urine volume was 195 mL.   11.26.2019: He is here for cystoscopy, flow rate. Recent prostate ultrasound revealed volume of 33.4 mL. Mild intravesical protrusion of prostate but no definitive median lobe.    1.7.2020: Urolift--6 implants placed.   2.25.2020: He is here today for follow-up of his Urolift. He is now off of both tamsulosin capsules. He has had no recent difficulty. He did have dysuria for 4 days. IPSS 15. He is only waking up once a night to urinate. Biggest issue is a slow stream but this is not bothersome.   8.18.2020: He returns for follow-up re his Urolift. He is now very unsatisfied with his procedure. He states that he has no urinary pressure and must sit there for at least 5 minutes urinating. He also states that he wakes up every 45 mins - 1.5 hours during the night to urinate. He does not feel like he is emptying out well at all. This is most troublesome during the night, in the day he only urinates every 2-3 hours. Restarted on tamsulosin.   11.3.2020: Here today for follow-up. He states that since last visit he has had improvements to urinary stream and sx's. He was scheduled for possible cysto but he states he has had enough improvement w/ 1x tamsulosin that he does want further evaluation at this point. He is hesitant about being on more medication than he already is.  Back Pain:  11.3.2020: He is having severe chronic back pain. He is taking 8x tramadol daily to manage this. He also now wears a back support belt all day to help  manage pain. He has had some constipation w/ pain meds.  3.2.2021:Here today for follow-up. He reports that he has been having good weeks and bad weeks with regards to his urinary sx's. He still is c/o a sense of incomplete emptying. He is still on 1x tamsulosin and is interested in increasing his dosage. Retrospectively, he is not too sure his urolift was all that effective for him. Of note to him, his sx's do seem to improve somewhat after a BM -- he is on a bowel regimen of high fiber intake.   He continues to use spinal stimulator and daily tramadol for his back pain.  9.7.2021: Pt notes that his symptoms have remained stable. Pt  reports that urinary hesitancy as his most bothersome symptom. When he is able to initiate his stream he reports a weak FOS. Pt continuing on 2 tamsulosin per day. Pt denies any recent UTIs.  Past Medical History:  Diagnosis Date  . AAA (abdominal aortic aneurysm) ([place])   . Back pain    decompression belt bought off of TV  . CAD (coronary artery disease)   . Carotid artery disease ([place])    a. duplex 2015 - 1-39% bilaterally.  . Carotid bruit    November, 2013  . Cataract    bilateral - surgery to remove  . DDD (degenerative disc disease), lumbar    hands  . Dyslipidemia   . GERD (gastroesophageal reflux disease)   . History of kidney stones   . HTN (hypertension)   . Hx of CABG    2004  . MGUS (monoclonal gammopathy of unknown significance)   . Myocardial infarction ([place])   . 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 Spinal stenosis, lumbar    3-4  . Thrombocytopenia ([place])    Dr. Ralene Ok    Past Surgical History:  Procedure Laterality Date  . BACK SURGERY    . COLONOSCOPY    . COLONOSCOPY WITH PROPOFOL N/A 07/13/2017   Procedure: COLONOSCOPY WITH PROPOFOL;  Surgeon: Manus Gunning, MD;  [facility];  Service: Gastroenterology;  Laterality: N/A;  . CORONARY ARTERY BYPASS GRAFT  2004  . CYSTOSCOPY WITH INSERTION OF UROLIFT N/A 12/14/2018   Procedure: CYSTOSCOPY WITH INSERTION OF UROLIFT;  Surgeon: Franchot Gallo, MD;  [facility];  Service: Urology;  Laterality: N/A;  . CYSTOSCOPY WITH INSERTION OF UROLIFT N/A 09/04/2020   Procedure: CYSTOSCOPY WITH INSERTION OF UROLIFT;  Surgeon: Franchot Gallo, MD;  [facility];  Service: Urology;  Laterality: N/A;  . DECOMPRESSIVE LUMBAR LAMINECTOMY LEVEL 1 N/A 09/04/2016   Procedure: DECOMPRESSION L3 - L4;  Surgeon: Melina Schools, MD;  Location: [facility];  Service: Orthopedics;  Laterality: N/A;  . EYE SURGERY  2013   bilateral cataract extraction w/ IOL (Dr. Celene Squibb), retina  surgery  . JOINT REPLACEMENT    . LUMBAR LAMINECTOMY/DECOMPRESSION MICRODISCECTOMY  09/04/2016   L3-4  . SPINAL CORD STIMULATOR INSERTION N/A 05/13/2019   Procedure: LUMBAR SPINAL CORD STIMULATOR INSERTION;  Surgeon: Clydell Hakim, MD;  Location: [facility];  Service: Neurosurgery;  Laterality: N/A;  LUMBAR SPINAL CORD STIMULATOR INSERTION  . TOTAL HIP ARTHROPLASTY Right 10/2003    Home Medications:  Allergies as of 10/16/2020      Reactions   Penicillins Other (See Comments)   PATIENT HAS HAD A PCN REACTION WITH IMMEDIATE RASH, FACIAL/TONGUE/THROAT SWELLING, SOB, OR LIGHTHEADEDNESS WITH HYPOTENSION:   #  YES  #    Has patient had a PCN  reaction causing severe rash involving mucus membranes or skin necrosis: No Has patient had a PCN reaction that required hospitalization No Has patient had a PCN reaction occurring within the last 10 years: No If all of the above answers are "NO", then may proceed with Cephalosporin use. Passed out      Medication List       Accurate as of October 16, 2020 12:42 PM. If you have any questions, ask your nurse or doctor.        aspirin 81 MG tablet Take 1 tablet (81 mg total) by mouth daily.   betamethasone valerate ointment 0.1 % Commonly known as: VALISONE Apply 1 application topically 2 (two) times daily.   CENTRUM SILVER ULTRA MENS PO Take 1 tablet by mouth daily.   docusate sodium 50 MG capsule Commonly known as: COLACE Take 50 mg by mouth at bedtime.   doxycycline 100 MG tablet Commonly known as: VIBRA-TABS Take 1 tablet (100 mg total) by mouth 2 (two) times daily.   esomeprazole 20 MG capsule Commonly known as: NEXIUM Take 20 mg by mouth daily at 12 noon.   ipratropium 0.03 % nasal spray Commonly known as: ATROVENT Place 2 sprays into both nostrils every 12 (twelve) hours.   lisinopril 40 MG tablet Commonly known as: ZESTRIL TAKE 1 TABLET BY MOUTH EVERY DAY   metoprolol tartrate 50 MG tablet Commonly known as: LOPRESSOR Take 1  tablet (50 mg total) by mouth 2 (two) times daily.   montelukast 10 MG tablet Commonly known as: SINGULAIR TAKE 1 TABLET BY MOUTH EVERYDAY AT BEDTIME   nitroGLYCERIN 0.4 MG SL tablet Commonly known as: NITROSTAT Place 1 tablet (0.4 mg total) under the tongue every 5 (five) minutes as needed for chest pain.   simvastatin 40 MG tablet Commonly known as: ZOCOR Take 1 tablet (40 mg total) by mouth at bedtime.   tamsulosin 0.4 MG Caps capsule Commonly known as: FLOMAX Take 2 capsules (0.8 mg total) by mouth daily.   traMADol 50 MG tablet Commonly known as: ULTRAM Take 50 mg by mouth every 4 (four) hours as needed for moderate pain.       Allergies:  Allergies  Allergen Reactions  . Penicillins Other (See Comments)    PATIENT HAS HAD A PCN REACTION WITH IMMEDIATE RASH, FACIAL/TONGUE/THROAT SWELLING, SOB, OR LIGHTHEADEDNESS WITH HYPOTENSION:   #  YES  #    Has patient had a PCN reaction causing severe rash involving mucus membranes or skin necrosis: No Has patient had a PCN reaction that required hospitalization No Has patient had a PCN reaction occurring within the last 10 years: No If all of the above answers are "NO", then may proceed with Cephalosporin use.  Passed out    Family History  Problem Relation Age of Onset  . Kidney disease Mother   . COPD Mother   . Stroke Father        cerebral hemorrhage  . Heart disease Brother   . Heart disease Brother   . Arthritis Brother   . Cancer Paternal Grandfather     Social History:  reports that he has been smoking cigarettes. He has a 32.50 pack-year smoking history. He has never used smokeless tobacco. He reports previous alcohol use. He reports that he does not use drugs.  ROS: A complete review of systems was performed.  All systems are negative except for pertinent findings as noted.  Physical Exam:  Vital signs in last 24 hours: There were no vitals taken  for this visit. Constitutional:  Alert and oriented, No  acute distress Respiratory: Normal respiratory effort Neurologic: Grossly intact, no focal deficits Psychiatric: Normal mood and affect I have reviewed prior pt notes  I have reviewed notes from referring/previous physicians  I have reviewed urinalysis results  I have independently reviewed prior imaging/pv U/S  I have reviewed prior PSA results    Impression/Assessment:  1. Pt tolerated operation adequately and is stabilizing but still experiencing bladder spasms and sporadic LUTS  Plan: F/U in 3 months for OV and symptom recheck  CC: Dr. Pricilla Holm

## 2020-10-16 NOTE — Progress Notes (Signed)
Urological Symptom Review  Patient is experiencing the following symptoms: Get up at night to urinate Stream starts and stops   Review of Systems  Gastrointestinal (upper)  : Negative for upper GI symptoms  Gastrointestinal (lower) : Constipation  Constitutional : Fatigue  Skin: Negative for skin symptoms  Eyes: Negative for eye symptoms  Ear/Nose/Throat : Sinus problems  Hematologic/Lymphatic: Negative for Hematologic/Lymphatic symptoms  Cardiovascular : Negative for cardiovascular symptoms  Respiratory : Negative for respiratory symptoms  Endocrine: Negative for endocrine symptoms  Musculoskeletal: Back pain  Neurological: Negative for neurological symptoms  Psychologic: Negative for psychiatric symptoms

## 2020-10-22 DIAGNOSIS — H43812 Vitreous degeneration, left eye: Secondary | ICD-10-CM | POA: Diagnosis not present

## 2020-11-16 ENCOUNTER — Other Ambulatory Visit: Payer: Self-pay | Admitting: Cardiology

## 2020-11-19 ENCOUNTER — Other Ambulatory Visit: Payer: Self-pay | Admitting: Internal Medicine

## 2021-01-13 IMAGING — RF THORACIC SPINE - 1 VIEW
1 series · 1 of 1 positions shown · non-contrast
Comparison: PA and lateral chest 01/01/2010.

CLINICAL DATA: Intraoperative imaging for spinal stimulator
placement.

EXAM:
DG C-ARM 61-120 MIN; THORACIC SPINE - 1 VIEW

[Series 1: run · 1 of 1 slices shown]
[im 1/1]
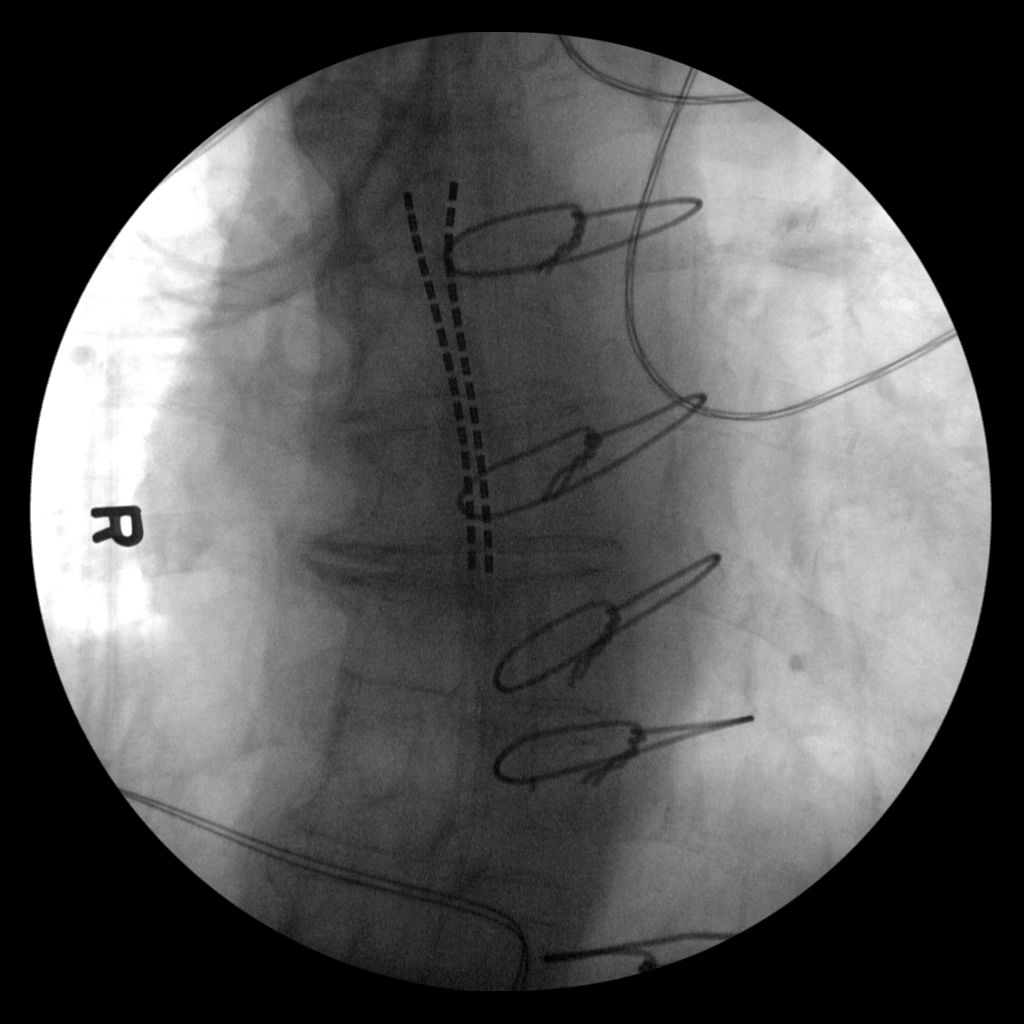

[1 of 1 positions shown; findings below may reference images not displayed]

FINDINGS: Single fluoroscopic AP view of the thoracic spine is provided. Exact
levels are difficult to determine but it appears to extend from
approximately mid T7-8 to T9-10. No acute abnormality.
IMPRESSION: Intraoperative imaging for spinal stimulator placement.

## 2021-01-15 ENCOUNTER — Ambulatory Visit: Payer: PPO | Admitting: Urology

## 2021-01-15 DIAGNOSIS — H52203 Unspecified astigmatism, bilateral: Secondary | ICD-10-CM | POA: Diagnosis not present

## 2021-01-15 DIAGNOSIS — H43812 Vitreous degeneration, left eye: Secondary | ICD-10-CM | POA: Diagnosis not present

## 2021-01-15 DIAGNOSIS — Z961 Presence of intraocular lens: Secondary | ICD-10-CM | POA: Diagnosis not present

## 2021-01-28 DIAGNOSIS — M259 Joint disorder, unspecified: Secondary | ICD-10-CM | POA: Diagnosis not present

## 2021-01-28 DIAGNOSIS — M5459 Other low back pain: Secondary | ICD-10-CM | POA: Diagnosis not present

## 2021-01-30 ENCOUNTER — Other Ambulatory Visit: Payer: Self-pay | Admitting: Cardiology

## 2021-01-30 DIAGNOSIS — E785 Hyperlipidemia, unspecified: Secondary | ICD-10-CM

## 2021-01-30 DIAGNOSIS — I2510 Atherosclerotic heart disease of native coronary artery without angina pectoris: Secondary | ICD-10-CM

## 2021-01-30 DIAGNOSIS — I10 Essential (primary) hypertension: Secondary | ICD-10-CM

## 2021-02-05 ENCOUNTER — Encounter: Payer: Self-pay | Admitting: Cardiology

## 2021-02-05 ENCOUNTER — Other Ambulatory Visit: Payer: Self-pay

## 2021-02-05 ENCOUNTER — Ambulatory Visit: Payer: PPO | Admitting: Cardiology

## 2021-02-05 VITALS — BP 162/88 | HR 70 | Ht 70.0 in | Wt 175.6 lb

## 2021-02-05 DIAGNOSIS — E785 Hyperlipidemia, unspecified: Secondary | ICD-10-CM | POA: Diagnosis not present

## 2021-02-05 DIAGNOSIS — I779 Disorder of arteries and arterioles, unspecified: Secondary | ICD-10-CM

## 2021-02-05 DIAGNOSIS — I2510 Atherosclerotic heart disease of native coronary artery without angina pectoris: Secondary | ICD-10-CM | POA: Diagnosis not present

## 2021-02-05 DIAGNOSIS — I452 Bifascicular block: Secondary | ICD-10-CM

## 2021-02-05 DIAGNOSIS — I10 Essential (primary) hypertension: Secondary | ICD-10-CM

## 2021-02-05 MED ORDER — AMLODIPINE BESYLATE 2.5 MG PO TABS: 2.5000 mg | ORAL_TABLET | Freq: Every day | ORAL | 3 refills | Status: DC

## 2021-02-05 NOTE — Progress Notes (Signed)
Cardiology Office Note    Date:  02/05/2021  ID:  [name] [name], DOB 1941-01-10, MRN [id number] PCP:  Hoyt Koch, MD  Cardiologist:  [name], MD   Chief Complaint: 6 months follow up  History of Present Illness:  [name] [name] is a 80 y.o. male with history of CAD s/p CABGx5V (2004), small AAA followed by Dr. Donnetta Hutching, carotid artery disease, tobacco abuse, thrombocytopenia/MGUS, HTN, HLD, RBBB/LPFB, hyperglycemia who presents for f/u of elevated blood pressure. The patient is coming after 6 months, he now walks with a walker because he has significant lower back pain secondary to degenerative disc disease.  However he denies any chest pain or shortness of breath.  He has no lower extremity edema or claudications.  He has been compliant with his medications and has no side effects. His last AAA duplex was in February 2021 that showed The largest aortic diameter remains essentially unchanged  compared to prior exam. Previous diameter measurement was 4.53 x 4.45 cm obtained on 06/13/2019.   Carotid duplex in 10/2014 showed 1-39% stenosis, f/u recommended 2 years. Last echocardiogram in 2004 showed EF 55-60%, mild LVH, abnormal relaxation. Nuclear stress test in 2011 showed no ischemia.   Patient is coming for regular follow-up, he is doing well from cardiac standpoint however he has been dealing with significant back pain and is undergoing injection tomorrow.  He denies any lower extremity edema, orthopnea proximal nocturnal dyspnea and no chest pain.  He has been tolerating his medications well, his blood pressure has been running in 160s.  Past Medical History:  Diagnosis Date  . AAA (abdominal aortic aneurysm) ([place])   . Back pain    decompression belt bought off of TV  . CAD (coronary artery disease)   . Carotid artery disease ([place])    a. duplex 2015 - 1-39% bilaterally.  . Carotid bruit    November, 2013  . Cataract    bilateral - surgery to remove  . DDD  (degenerative disc disease), lumbar    hands  . Dyslipidemia   . GERD (gastroesophageal reflux disease)   . History of kidney stones   . HTN (hypertension)   . Hx of CABG    2004  . MGUS (monoclonal gammopathy of unknown significance)   . Myocardial infarction ([place])   . 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 Spinal stenosis, lumbar    3-4  . Thrombocytopenia ([place])    Dr. Ralene Ok    Past Surgical History:  Procedure Laterality Date  . BACK SURGERY    . COLONOSCOPY    . COLONOSCOPY WITH PROPOFOL N/A 07/13/2017   Procedure: COLONOSCOPY WITH PROPOFOL;  Surgeon: Manus Gunning, MD;  [facility];  Service: Gastroenterology;  Laterality: N/A;  . CORONARY ARTERY BYPASS GRAFT  2004  . CYSTOSCOPY WITH INSERTION OF UROLIFT N/A 12/14/2018   Procedure: CYSTOSCOPY WITH INSERTION OF UROLIFT;  Surgeon: Franchot Gallo, MD;  [facility];  Service: Urology;  Laterality: N/A;  . CYSTOSCOPY WITH INSERTION OF UROLIFT N/A 09/04/2020   Procedure: CYSTOSCOPY WITH INSERTION OF UROLIFT;  Surgeon: Franchot Gallo, MD;  [facility];  Service: Urology;  Laterality: N/A;  . DECOMPRESSIVE LUMBAR LAMINECTOMY LEVEL 1 N/A 09/04/2016   Procedure: DECOMPRESSION L3 - L4;  Surgeon: Melina Schools, MD;  Location: [facility];  Service: Orthopedics;  Laterality: N/A;  . EYE SURGERY  2013   bilateral cataract extraction w/ IOL (Dr. Celene Squibb), retina surgery  .  JOINT REPLACEMENT    . LUMBAR LAMINECTOMY/DECOMPRESSION MICRODISCECTOMY  09/04/2016   L3-4  . SPINAL CORD STIMULATOR INSERTION N/A 05/13/2019   Procedure: LUMBAR SPINAL CORD STIMULATOR INSERTION;  Surgeon: Clydell Hakim, MD;  Location: [facility];  Service: Neurosurgery;  Laterality: N/A;  LUMBAR SPINAL CORD STIMULATOR INSERTION  . TOTAL HIP ARTHROPLASTY Right 10/2003   Current Medications: Current Meds  Medication Sig  . aspirin 81 MG tablet Take 1 tablet (81 mg total) by mouth daily.  . betamethasone  valerate ointment (VALISONE) 0.1 % Apply 1 application topically 2 (two) times daily.  Marland Kitchen docusate sodium (COLACE) 50 MG capsule Take 50 mg by mouth at bedtime.   Marland Kitchen esomeprazole (NEXIUM) 20 MG capsule Take 20 mg by mouth daily at 12 noon.  Marland Kitchen ipratropium (ATROVENT) 0.03 % nasal spray Place 2 sprays into both nostrils every 12 (twelve) hours.  Marland Kitchen lisinopril (ZESTRIL) 40 MG tablet TAKE 1 TABLET BY MOUTH EVERY DAY  . metoprolol tartrate (LOPRESSOR) 50 MG tablet TAKE 1 TABLET BY MOUTH TWICE A DAY  . montelukast (SINGULAIR) 10 MG tablet TAKE 1 TABLET BY MOUTH EVERYDAY AT BEDTIME  . Multiple Vitamins-Minerals (CENTRUM SILVER ULTRA MENS PO) Take 1 tablet by mouth daily.  . nitroGLYCERIN (NITROSTAT) 0.4 MG SL tablet Place 1 tablet (0.4 mg total) under the tongue every 5 (five) minutes as needed for chest pain.  . simvastatin (ZOCOR) 40 MG tablet TAKE 1 TABLET BY MOUTH EVERYDAY AT BEDTIME  . tamsulosin (FLOMAX) 0.4 MG CAPS capsule Take 2 capsules (0.8 mg total) by mouth daily.  . traMADol (ULTRAM) 50 MG tablet Take 50 mg by mouth every 4 (four) hours as needed for moderate pain.      Allergies:   Penicillins   Social History   Socioeconomic History  . Marital status: Married    Spouse name: Not on file  . Number of children: 2  . Years of education: 85  . Highest education level: Not on file  Occupational History  . Occupation: trucking     Fish farm manager: RETIRED  Tobacco Use  . Smoking status: Heavy Tobacco Smoker    Packs/day: 0.50    Years: 65.00    Pack years: 32.50    Types: Cigarettes  . Smokeless tobacco: Never Used  . Tobacco comment: smoking since age 12 yrs old  Vaping Use  . Vaping Use: Never used  Substance and Sexual Activity  . Alcohol use: Not Currently  . Drug use: No  . Sexual activity: Not Currently  Other Topics Concern  . Not on file  Social History Narrative   HSG, Married '62, 1 son-'70, 1 dtr - '68; 2 grandchildren. Work - Probation officer.   ACP - DNR, DNI,  no artificial hydration or feeding, no heroic measures   Social Determinants of Health   Financial Resource Strain: Not on file  Food Insecurity: Not on file  Transportation Needs: Not on file  Physical Activity: Unknown  . Days of Exercise per Week: Patient refused  . Minutes of Exercise per Session: Patient refused  Stress: Not on file  Social Connections: Not on file    Family History:  The patient's family history includes Arthritis in his brother; COPD in his mother; Cancer in his paternal grandfather; Heart disease in his brother and brother; Kidney disease in his mother; Stroke in his father.  ROS:   Please see the history of present illness.  All other systems are reviewed and otherwise negative.   PHYSICAL EXAM:   VS:  BP (!) 162/88   Pulse 70   Ht 5\' 10"  (1.778 m)   Wt 175 lb 9.6 oz (79.7 kg)   SpO2 97%   BMI 25.20 kg/m   BMI: Body mass index is 25.2 kg/m. GEN: Well nourished, well developed WM, in no acute distress HEENT: normocephalic, atraumatic Neck: no JVD, carotid bruits, or masses Cardiac: RRR; no murmurs, rubs, or gallops, no edema  Respiratory:  Coarse BS throughout, no wheezing or rales, normal work of breathing GI: soft, nontender, nondistended, + BS MS: no deformity or atrophy Skin: warm and dry, no rash Neuro:  Alert and Oriented x 3, Strength and sensation are intact, follows commands Psych: euthymic mood, full affect  Wt Readings from Last 3 Encounters:  02/05/21 175 lb 9.6 oz (79.7 kg)  10/16/20 170 lb (77.1 kg)  10/09/20 169 lb 4.8 oz (76.8 kg)    Studies/Labs Reviewed:   EKG:  EKG was ordered today.  It shows normal sinus rhythm with first-degree AV block, right bundle branch block and left anterior fascicular block unchanged from prior.  Recent Labs: 06/05/2020: ALT 17; BUN 13; Creatinine 0.85; Hemoglobin 15.1; Platelet Count 199; Potassium 4.3; Sodium 138   Lipid Panel    Component Value Date/Time   CHOL 131 11/09/2019 1029   TRIG  49 11/09/2019 1029   HDL 47 11/09/2019 1029   CHOLHDL 2.8 11/09/2019 1029   CHOLHDL 3 01/31/2016 0847   VLDL 15.4 01/31/2016 0847   LDLCALC 73 11/09/2019 1029   Additional studies/ records that were reviewed today include: Summarized above   ASSESSMENT & PLAN:   1. Essential HTN -his blood pressure has been elevated, I will add amlodipine 2.5 mg to be taken at night. 2. I am sorry CAD s/p CABG - asymptomatic, no angina. Continue ASA, BB, statin.  He is lipids are all at goal.  Normal liver function test.  3. PAD with small AAA and carotid artery disease- followed by vascular. 4. Bifascicular block with RBBB, LPFB - asymptomatic, unchanged 5. Hyperlipidemia - lipids all at goal, he is tolerating simvastatin well.  LFTs normal.  Disposition: F/u with Dr. Meda Coffee 6 months.  Medication Adjustments/Labs and Tests Ordered: Current medicines are reviewed at length with the patient today.  Concerns regarding medicines are outlined above. Medication changes, Labs and Tests ordered today are summarized above and listed in the Patient Instructions accessible in Encounters.   Signed, [name], MD  02/05/2021 9:49 AM    [facility] [address], [place], [place]  [address] Phone: [phone]; Fax: [phone] okay very good no

## 2021-02-05 NOTE — Patient Instructions (Addendum)
Medication Instructions:   START TAKING AMLODIPINE 2.5 MG BY MOUTH DAILY AT BEDTIME  *If you need a refill on your cardiac medications before your next appointment, please call your pharmacy*   Lab Work: none If you have labs (blood work) drawn today and your tests are completely normal, you will receive your results only by: Marland Kitchen MyChart Message (if you have MyChart) OR . A paper copy in the mail If you have any lab test that is abnormal or we need to change your treatment, we will call you to review the results.   Testing/Procedures: none   Follow-Up: At [facility], you and your health needs are our priority.  As part of our continuing mission to provide you with exceptional heart care, we have created designated Provider Care Teams.  These Care Teams include your primary Cardiologist (physician) and Advanced Practice Providers (APPs -  Physician Assistants and Nurse Practitioners) who all work together to provide you with the care you need, when you need it.  We recommend signing up for the patient portal called "MyChart".  Sign up information is provided on this After Visit Summary.  MyChart is used to connect with patients for Virtual Visits (Telemedicine).  Patients are able to view lab/test results, encounter notes, upcoming appointments, etc.  Non-urgent messages can be sent to your provider as well.   To learn more about what you can do with MyChart, go to [email or web address].    Your next appointment:   6 month(s)  The format for your next appointment:   In Person  Provider:   You may see Dr. Gwyndolyn Kaufman or one of the following Advanced Practice Providers on your designated Care Team:    Richardson Dopp, PA-C  [place], [place]

## 2021-02-06 DIAGNOSIS — M533 Sacrococcygeal disorders, not elsewhere classified: Secondary | ICD-10-CM | POA: Diagnosis not present

## 2021-02-07 MED ORDER — ROSUVASTATIN CALCIUM 20 MG PO TABS: 20.0000 mg | ORAL_TABLET | Freq: Every day | ORAL | 2 refills | Status: DC

## 2021-02-07 NOTE — Telephone Encounter (Signed)
Dorothy Spark, MD  You; Cv Div Pharmd; Supple, Harlon Flor, RPH-CPP; Marcelle Overlie D, RPH-CPP; Pavero, Harrell Gave, RPH 11 minutes ago (10:27 AM)     Thank you Jinny Blossom! Yet another reason for not liking simvastatin

## 2021-02-07 NOTE — Telephone Encounter (Signed)
Last read by [name] at 8:33 AM on 02/07/2021.

## 2021-02-07 NOTE — Telephone Encounter (Signed)
Supple, Harlon Flor, RPH-CPP  You; Dorothy Spark, MD 24 minutes ago (7:38 AM)     Yes his simvastatin and amlodipine interact - amlodipine can increase the concentrations of simvastatin so simvastatin dosing should be capped at 20mg  if the two meds are being used together. He's taking 40mg  of simvastatin and his most recent LDL was 73. I'd recommend changing him to rosuvastatin 20mg  daily to bring his LDL to < 70 with his history of CAD and to avoid the drug interaction altogether.   Thanks,  Jinny Blossom    Endorsed recommendations to the pt via [employer], as mentioned above per [employer].  Informed the pt that we will stop his simvastatin and start him on rosuvastatin 20 mg po daily due to drug interaction between simvastatin and amlodipine, and still needing to control his lipids, especially his LDL. Advised the pt that he should continue his current dose of amlodipine, stop simvastatin, and start taking rosuvastatin 20 mg po daily.  Informed the pt that I will send in the new script for rosuvastatin 20 mg po daily to the pts pharmacy on file.  Advised him to call me back or message me in [place] with any further questions or concerns regarding these changes.

## 2021-02-11 ENCOUNTER — Other Ambulatory Visit: Payer: Self-pay | Admitting: Internal Medicine

## 2021-02-13 MED ORDER — CARVEDILOL 12.5 MG PO TABS: 12.5000 mg | ORAL_TABLET | Freq: Two times a day (BID) | ORAL | 1 refills | Status: DC

## 2021-02-13 NOTE — Telephone Encounter (Signed)
[name] Spark, MD  You 13 minutes ago (10:39 AM)     Please disregard prior message about amlodipine, discontinue amlodipine and switch metoprolol to carvedilol 12.5 mg p.o. twice daily.       Spoke with the pt and informed him that per Dr. Meda Coffee, she wants him to discontinue his amlodipine and metoprolol, and we will start him on carvedilol 12.5 mg po bid.  Confirmed the pharmacy of choice with the pt.  Advised the pt to take his coreg with meals. Advised him to continue to monitor his HR/BP, and report any concerning numbers to [place] via mychart.  Pt verbalized understanding and agrees with this plan.

## 2021-02-26 ENCOUNTER — Encounter: Payer: Self-pay | Admitting: Urology

## 2021-02-26 ENCOUNTER — Other Ambulatory Visit: Payer: Self-pay

## 2021-02-26 ENCOUNTER — Ambulatory Visit (INDEPENDENT_AMBULATORY_CARE_PROVIDER_SITE_OTHER): Payer: PPO | Admitting: Urology

## 2021-02-26 VITALS — BP 127/79 | HR 89 | Temp 98.1°F

## 2021-02-26 DIAGNOSIS — N401 Enlarged prostate with lower urinary tract symptoms: Secondary | ICD-10-CM | POA: Diagnosis not present

## 2021-02-26 LAB — URINALYSIS, ROUTINE W REFLEX MICROSCOPIC
Bilirubin, UA: NEGATIVE
Glucose, UA: NEGATIVE
Ketones, UA: NEGATIVE
Leukocytes,UA: NEGATIVE
Nitrite, UA: NEGATIVE
Protein,UA: NEGATIVE
RBC, UA: NEGATIVE
Specific Gravity, UA: 1.015 (ref 1.005–1.030)
Urobilinogen, Ur: 0.2 mg/dL (ref 0.2–1.0)
pH, UA: 7 (ref 5.0–7.5)

## 2021-02-26 NOTE — Progress Notes (Signed)
History of Present Illness: This patient is here today for follow-up of BPH, status post treatment with UroLift x2.  He is still having significant issues with voiding.  At times, he voids over 900 mL at night, with him waking up 7 times.  The average number of visits to the toilet at night are 3.  He does wake up a lot with pain in his back and hip and this is when he feels like he has to void.  He does have daytime frequency and urgency.  Stream is moderate.  He is off of tamsulosin.  IPSS Questionnaire (AUA-7): Over the past month.   1)  How often have you had a sensation of not emptying your bladder completely after you finish urinating?  3  2)  How often have you had to urinate again less than two hours after you finished urinating? 3  3)  How often have you found you stopped and started again several times when you urinated?  2  4) How difficult have you found it to postpone urination?  1  5) How often have you had a weak urinary stream?  3  6) How often have you had to push or strain to begin urination?  4  7) How many times did you most typically get up to urinate from the time you went to bed until the time you got up in the morning?  3    Total score:  0-7 mildly symptomatic   8-19 moderately symptomatic   20-35 severely symptomatic    1.7.2020: Urolift--6 implants placed.   9.28.2021: Repeat UroLift procedure, 7 implants placed.  Past Medical History:  Diagnosis Date  . AAA (abdominal aortic aneurysm) ([place])   . Back pain    decompression belt bought off of TV  . CAD (coronary artery disease)   . Carotid artery disease ([place])    a. duplex 2015 - 1-39% bilaterally.  . Carotid bruit    November, 2013  . Cataract    bilateral - surgery to remove  . DDD (degenerative disc disease), lumbar    hands  . Dyslipidemia   . GERD (gastroesophageal reflux disease)   . History of kidney stones   . HTN (hypertension)   . Hx of CABG    2004  . MGUS (monoclonal gammopathy of unknown  significance)   . Myocardial infarction ([place])   . 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 Spinal stenosis, lumbar    3-4  . Thrombocytopenia ([place])    Dr. Ralene Ok    Past Surgical History:  Procedure Laterality Date  . BACK SURGERY    . COLONOSCOPY    . COLONOSCOPY WITH PROPOFOL N/A 07/13/2017   Procedure: COLONOSCOPY WITH PROPOFOL;  Surgeon: Manus Gunning, MD;  [facility];  Service: Gastroenterology;  Laterality: N/A;  . CORONARY ARTERY BYPASS GRAFT  2004  . CYSTOSCOPY WITH INSERTION OF UROLIFT N/A 12/14/2018   Procedure: CYSTOSCOPY WITH INSERTION OF UROLIFT;  Surgeon: Franchot Gallo, MD;  Location: [facility];  Service: Urology;  Laterality: N/A;  . CYSTOSCOPY WITH INSERTION OF UROLIFT N/A 09/04/2020   Procedure: CYSTOSCOPY WITH INSERTION OF UROLIFT;  Surgeon: Franchot Gallo, MD;  Location: [facility];  Service: Urology;  Laterality: N/A;  . DECOMPRESSIVE LUMBAR LAMINECTOMY LEVEL 1 N/A 09/04/2016   Procedure: DECOMPRESSION L3 - L4;  Surgeon: Melina Schools, MD;  Location: [facility];  Service: Orthopedics;  Laterality: N/A;  . EYE SURGERY  2013  bilateral cataract extraction w/ IOL (Dr. Celene Squibb), retina surgery  . JOINT REPLACEMENT    . LUMBAR LAMINECTOMY/DECOMPRESSION MICRODISCECTOMY  09/04/2016   L3-4  . SPINAL CORD STIMULATOR INSERTION N/A 05/13/2019   Procedure: LUMBAR SPINAL CORD STIMULATOR INSERTION;  Surgeon: Clydell Hakim, MD;  Location: [facility];  Service: Neurosurgery;  Laterality: N/A;  LUMBAR SPINAL CORD STIMULATOR INSERTION  . TOTAL HIP ARTHROPLASTY Right 10/2003    Home Medications:  Allergies as of 02/26/2021      Reactions   Penicillins Other (See Comments)   PATIENT HAS HAD A PCN REACTION WITH IMMEDIATE RASH, FACIAL/TONGUE/THROAT SWELLING, SOB, OR LIGHTHEADEDNESS WITH HYPOTENSION:   #  YES  #    Has patient had a PCN reaction causing severe rash involving mucus membranes or skin necrosis: No Has patient had a PCN reaction  that required hospitalization No Has patient had a PCN reaction occurring within the last 10 years: No If all of the above answers are "NO", then may proceed with Cephalosporin use. Passed out      Medication List       Accurate as of February 26, 2021  8:07 AM. If you have any questions, ask your nurse or doctor.        aspirin 81 MG tablet Take 1 tablet (81 mg total) by mouth daily.   betamethasone valerate ointment 0.1 % Commonly known as: VALISONE Apply 1 application topically 2 (two) times daily.   carvedilol 12.5 MG tablet Commonly known as: COREG Take 1 tablet (12.5 mg total) by mouth 2 (two) times daily with a meal.   CENTRUM SILVER ULTRA MENS PO Take 1 tablet by mouth daily.   docusate sodium 50 MG capsule Commonly known as: COLACE Take 50 mg by mouth at bedtime.   esomeprazole 20 MG capsule Commonly known as: NEXIUM Take 20 mg by mouth daily at 12 noon.   ipratropium 0.03 % nasal spray Commonly known as: ATROVENT Place 2 sprays into both nostrils every 12 (twelve) hours.   lisinopril 40 MG tablet Commonly known as: ZESTRIL TAKE 1 TABLET BY MOUTH EVERY DAY   montelukast 10 MG tablet Commonly known as: SINGULAIR TAKE 1 TABLET BY MOUTH EVERYDAY AT BEDTIME   nitroGLYCERIN 0.4 MG SL tablet Commonly known as: NITROSTAT Place 1 tablet (0.4 mg total) under the tongue every 5 (five) minutes as needed for chest pain.   rosuvastatin 20 MG tablet Commonly known as: CRESTOR Take 1 tablet (20 mg total) by mouth daily.   tamsulosin 0.4 MG Caps capsule Commonly known as: FLOMAX Take 2 capsules (0.8 mg total) by mouth daily.   traMADol 50 MG tablet Commonly known as: ULTRAM Take 50 mg by mouth every 4 (four) hours as needed for moderate pain.       Allergies:  Allergies  Allergen Reactions  . Penicillins Other (See Comments)    PATIENT HAS HAD A PCN REACTION WITH IMMEDIATE RASH, FACIAL/TONGUE/THROAT SWELLING, SOB, OR LIGHTHEADEDNESS WITH HYPOTENSION:   #   YES  #    Has patient had a PCN reaction causing severe rash involving mucus membranes or skin necrosis: No Has patient had a PCN reaction that required hospitalization No Has patient had a PCN reaction occurring within the last 10 years: No If all of the above answers are "NO", then may proceed with Cephalosporin use.  Passed out    Family History  Problem Relation Age of Onset  . Kidney disease Mother   . COPD Mother   . Stroke Father  cerebral hemorrhage  . Heart disease Brother   . Heart disease Brother   . Arthritis Brother   . Cancer Paternal Grandfather     Social History:  reports that he has been smoking cigarettes. He has a 32.50 pack-year smoking history. He has never used smokeless tobacco. He reports previous alcohol use. He reports that he does not use drugs.  ROS: A complete review of systems was performed.  All systems are negative except for pertinent findings as noted.  Physical Exam:  Vital signs in last 24 hours: There were no vitals taken for this visit. Constitutional:  Alert and oriented, No acute distress Cardiovascular: Regular rate  Respiratory: Normal respiratory effort Neurologic: Grossly intact, no focal deficits Psychiatric: Normal mood and affect  I have reviewed prior pt notes  I have reviewed notes from referring/previous physicians  I have reviewed urinalysis results  I have reviewed prior PSA results   Impression/Assessment:  BPH with moderate symptomatology, status post UroLift x2 with him off of medical therapy  Nocturia, sometimes significant, usually just 3 times at night  Urinary urgency and frequency  Plan:  1.  I did discuss overactive bladder dietary modification as well as limiting daytime caffeine  2.  I also discussed limiting afternoon and evening fluid intake  3.  I will give him Myrbetriq 50 mg, 4 weeks worth.  He will call with the results of this medication.  He knows to stop it if he has urinary  difficulty  4.  I will have him come back in 3 months for recheck

## 2021-02-26 NOTE — Progress Notes (Signed)
Urological Symptom Review  Patient is experiencing the following symptoms: Frequent urination Hard to postpone urination Get up at night to urinate Stream starts and stops Trouble starting stream Weak stream   Review of Systems  Gastrointestinal (upper)   constipation  Gastrointestinal (lower) : Negative for lower GI symptoms  Constitutional : Negative for symptoms  Skin: Negative for skin symptoms  Eyes: Negative for eye symptoms  Ear/Nose/Throat : Sinus problems  Hematologic/Lymphatic: Negative for Hematologic/Lymphatic symptoms  Cardiovascular : Negative for cardiovascular symptoms  Respiratory : Negative for respiratory symptoms  Endocrine: Negative for endocrine symptoms  Musculoskeletal: Back pain  Neurological: Negative for neurological symptoms  Psychologic: Negative for psychiatric symptoms

## 2021-02-27 DIAGNOSIS — M533 Sacrococcygeal disorders, not elsewhere classified: Secondary | ICD-10-CM | POA: Diagnosis not present

## 2021-03-07 ENCOUNTER — Encounter: Payer: Self-pay | Admitting: Physical Medicine & Rehabilitation

## 2021-03-18 ENCOUNTER — Other Ambulatory Visit: Payer: Self-pay | Admitting: *Deleted

## 2021-03-18 DIAGNOSIS — I714 Abdominal aortic aneurysm, without rupture, unspecified: Secondary | ICD-10-CM

## 2021-04-01 ENCOUNTER — Telehealth (INDEPENDENT_AMBULATORY_CARE_PROVIDER_SITE_OTHER): Payer: PPO | Admitting: Internal Medicine

## 2021-04-01 ENCOUNTER — Other Ambulatory Visit: Payer: Self-pay

## 2021-04-01 ENCOUNTER — Encounter: Payer: Self-pay | Admitting: Internal Medicine

## 2021-04-01 DIAGNOSIS — R059 Cough, unspecified: Secondary | ICD-10-CM | POA: Insufficient documentation

## 2021-04-01 MED ORDER — DOXYCYCLINE HYCLATE 100 MG PO TABS: 100.0000 mg | ORAL_TABLET | Freq: Two times a day (BID) | ORAL | 0 refills | Status: DC

## 2021-04-01 MED ORDER — PREDNISONE 20 MG PO TABS: 40.0000 mg | ORAL_TABLET | Freq: Every day | ORAL | 0 refills | Status: DC

## 2021-04-01 NOTE — Assessment & Plan Note (Signed)
Given MGUS and smoking rx prednisone and doxycycline.

## 2021-04-01 NOTE — Progress Notes (Signed)
Virtual Visit via Audio Note  I connected with [name] [name] on 04/01/21 at 11:00 AM EDT by an audio-only enabled telemedicine application and verified that I am speaking with the correct person using two identifiers.  The patient and the provider were at separate locations throughout the entire encounter. Patient location: home, Provider location: work   I discussed the limitations of evaluation and management by telemedicine and the availability of in person appointments. The patient expressed understanding and agreed to proceed. The patient and the provider were the only parties present for the visit unless noted in HPI below.  History of Present Illness: The patient is a 80 y.o. man with visit for cough and fever. Started last week. Has cough and green sputum and running nose and green drainage. Fevers last week and this is better now. Denies SOB on exertion but some SOB and fatigue in general. Overall it is worsening. Has tried tussin otc which is helping well with cough.  Observations/Objective: A and O times 3, mild cough during visit, no dyspnea speaking in full sentences  Assessment and Plan: See problem oriented charting  Follow Up Instructions: rx prednisone and doxycycline  Visit time 12 minutes in face to face communication with patient and coordination of care.  I discussed the assessment and treatment plan with the patient. The patient was provided an opportunity to ask questions and all were answered. The patient agreed with the plan and demonstrated an understanding of the instructions.   The patient was advised to call back or seek an in-person evaluation if the symptoms worsen or if the condition fails to improve as anticipated.  Hoyt Koch, MD

## 2021-04-04 ENCOUNTER — Encounter: Payer: PPO | Attending: Physical Medicine & Rehabilitation | Admitting: Physical Medicine & Rehabilitation

## 2021-04-04 ENCOUNTER — Other Ambulatory Visit: Payer: Self-pay

## 2021-04-04 ENCOUNTER — Encounter: Payer: Self-pay | Admitting: Physical Medicine & Rehabilitation

## 2021-04-04 VITALS — Temp 98.4°F | Ht 70.0 in | Wt 171.0 lb

## 2021-04-04 DIAGNOSIS — M533 Sacrococcygeal disorders, not elsewhere classified: Secondary | ICD-10-CM | POA: Diagnosis not present

## 2021-04-04 NOTE — Patient Instructions (Signed)
Will schedule test blocks of left sacroiliac joint , if these reduce pain by at least 50% will recommend radiofrequency neurotomy.  Will ask for insurance preauthorization  Also will see if spinal cord stim will interfere with the procedure

## 2021-04-04 NOTE — Progress Notes (Signed)
Subjective:    Patient ID: [name] [name], male    DOB: [birth year], 80 y.o.   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 Left sided low back /buttocks pain is primary complaints.  Patient also has some pain going down the thigh to the knee but not beyond the knee. Functional status patient ambulates with a walker.  He remains independent with his dressing and bathing needs some assistance with household duties.  He has been retired since 2010.  His walking tolerance is approximately 5 minutes with a walker.  He can still climb steps and drive. His average pain is 6-7 out of 10 increases with walking and bending as well as with standing improves with medications as well is sacroiliac injections.  Patient has a history of constipation as well as urinary retention. Current pain medications tramadol 100 mg 4 times daily Activity level 6 to 10 hours a day in bed 4 to 6 hours a day sitting 1 to 2 hours/day standing less than 1 hour a day walking  Left sacroiliac injection x 2 first one lasted ~76yrs, second injection done in March 2022 and lasted 3 days  L3-4 decompression 2017 per Dr. Rolena Infante  Spinal cord stim placement 2019 per Dr. Maryjean Ka, battery is in the left low back region, patient uses stimulator on a daily basis.  Pain Inventory Average Pain 6 Pain Right Now 7 My pain is aching  In the last 24 hours, has pain interfered with the following? General activity 6 Relation with others 5 Enjoyment of life 0 What TIME of day is your pain at its worst? daytime Sleep (in general) Poor  Pain is worse with: walking, bending and standing Pain improves with: medication and injections Relief from Meds: 5  use a cane use a walker ability to climb steps?  yes do you drive?  yes  retired  bladder control problems bowel control problems weakness  new  new    Family History  Problem Relation Age of  Onset  . Kidney disease Mother   . COPD Mother   . Stroke Father        cerebral hemorrhage  . Heart disease Brother   . Heart disease Brother   . Arthritis Brother   . Cancer Paternal Grandfather    Social History   Socioeconomic History  . Marital status: Married    Spouse name: Not on file  . Number of children: 2  . Years of education: 28  . Highest education level: Not on file  Occupational History  . Occupation: trucking     Fish farm manager: RETIRED  Tobacco Use  . Smoking status: Heavy Tobacco Smoker    Packs/day: 0.50    Years: 65.00    Pack years: 32.50    Types: Cigarettes  . Smokeless tobacco: Never Used  . Tobacco comment: smoking since age 83 yrs old  Vaping Use  . Vaping Use: Never used  Substance and Sexual Activity  . Alcohol use: Not Currently  . Drug use: No  . Sexual activity: Not Currently  Other Topics Concern  . Not on file  Social History Narrative   HSG, Married '62, 1 son-'70, 1 dtr - '68; 2 grandchildren. Work - Probation officer.   ACP - DNR, DNI, no artificial hydration or feeding, no heroic measures   Social Determinants of Health   Financial Resource Strain: Not on file  Food Insecurity: Not on file  Transportation Needs: Not on file  Physical Activity: Unknown  . Days of Exercise per Week: Patient refused  . Minutes of Exercise per Session: Patient refused  Stress: Not on file  Social Connections: Not on file   Past Surgical History:  Procedure Laterality Date  . BACK SURGERY    . COLONOSCOPY    . COLONOSCOPY WITH PROPOFOL N/A 07/13/2017   Procedure: COLONOSCOPY WITH PROPOFOL;  Surgeon: Manus Gunning, MD;  [facility];  Service: Gastroenterology;  Laterality: N/A;  . CORONARY ARTERY BYPASS GRAFT  2004  . CYSTOSCOPY WITH INSERTION OF UROLIFT N/A 12/14/2018   Procedure: CYSTOSCOPY WITH INSERTION OF UROLIFT;  Surgeon: Franchot Gallo, MD;  [facility];  Service: Urology;  Laterality: N/A;  . CYSTOSCOPY  WITH INSERTION OF UROLIFT N/A 09/04/2020   Procedure: CYSTOSCOPY WITH INSERTION OF UROLIFT;  Surgeon: Franchot Gallo, MD;  [facility];  Service: Urology;  Laterality: N/A;  . DECOMPRESSIVE LUMBAR LAMINECTOMY LEVEL 1 N/A 09/04/2016   Procedure: DECOMPRESSION L3 - L4;  Surgeon: Melina Schools, MD;  Location: [facility];  Service: Orthopedics;  Laterality: N/A;  . EYE SURGERY  2013   bilateral cataract extraction w/ IOL (Dr. Celene Squibb), retina surgery  . JOINT REPLACEMENT    . LUMBAR LAMINECTOMY/DECOMPRESSION MICRODISCECTOMY  09/04/2016   L3-4  . SPINAL CORD STIMULATOR INSERTION N/A 05/13/2019   Procedure: LUMBAR SPINAL CORD STIMULATOR INSERTION;  Surgeon: Clydell Hakim, MD;  Location: [facility];  Service: Neurosurgery;  Laterality: N/A;  LUMBAR SPINAL CORD STIMULATOR INSERTION  . TOTAL HIP ARTHROPLASTY Right 10/2003   Past Medical History:  Diagnosis Date  . AAA (abdominal aortic aneurysm) ([place])   . Back pain    decompression belt bought off of TV  . CAD (coronary artery disease)   . Carotid artery disease ([place])    a. duplex 2015 - 1-39% bilaterally.  . Carotid bruit    November, 2013  . Cataract    bilateral - surgery to remove  . DDD (degenerative disc disease), lumbar    hands  . Dyslipidemia   . GERD (gastroesophageal reflux disease)   . History of kidney stones   . HTN (hypertension)   . Hx of CABG    2004  . MGUS (monoclonal gammopathy of unknown significance)   . Myocardial infarction ([place])   . 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 Spinal stenosis, lumbar    3-4  . Thrombocytopenia ([place])    Dr. Ralene Ok   Temp 98.4 F (36.9 C)   Ht 5\' 10"  (1.778 m)   Wt 171 lb (77.6 kg)   BMI 24.54 kg/m   Opioid Risk Score:   Fall Risk Score:  `1  Depression screen PHQ 2/9  Depression screen [facility] 2/9 06/07/2020 05/17/2019 05/14/2018 08/31/2017 05/14/2017 01/31/2016 05/23/2014  Decreased Interest 0 0 0 0 0 0 0  Down, Depressed, Hopeless 0 0 1 0 1 0 0  PHQ -  2 Score 0 0 1 0 1 0 0  Altered sleeping - - 0 - - - -  Tired, decreased energy - - 1 - - - -  Change in appetite - - 0 - - - -  Feeling bad or failure about yourself  - - 1 - - - -  Trouble concentrating - - 0 - - - -  Moving slowly or fidgety/restless - - 0 - - - -  Suicidal thoughts - - 0 - - - -  PHQ-9 Score - - 3 - - - -  Difficult doing work/chores - - Not difficult at all - - - -  Some recent data might be hidden    Review of Systems  Constitutional: Negative.   Eyes: Negative.   Respiratory: Negative.   Cardiovascular: Negative.   Endocrine: Negative.   Genitourinary: Negative.   Musculoskeletal: Positive for back pain and gait problem.  Skin: Negative.   Allergic/Immunologic: Negative.   Hematological: Negative.   Psychiatric/Behavioral: Negative.        Objective:   Physical Exam Constitutional:      Appearance: He is normal weight.  HENT:     Head: Normocephalic and atraumatic.  Eyes:     Extraocular Movements: Extraocular movements intact.     Conjunctiva/sclera: Conjunctivae normal.     Pupils: Pupils are equal, round, and reactive to light.  Cardiovascular:     Rate and Rhythm: Normal rate and regular rhythm.     Heart sounds: Normal heart sounds.  Pulmonary:     Effort: Pulmonary effort is normal. No respiratory distress.     Breath sounds: Normal breath sounds.  Abdominal:     General: Abdomen is flat. Bowel sounds are normal. There is no distension.     Palpations: Abdomen is soft.  Musculoskeletal:        General: Tenderness present.     Cervical back: Normal range of motion.     Comments:    FABER's: Positive at the left PSIS Distraction (supine): Negative Thigh thrust test: Negative bilaterally  Skin:    General: Skin is warm and dry.  Neurological:     General: No focal deficit present.     Mental Status: He is alert and oriented to person, place, and time.     Comments: Negative straight leg raise  Motor strength is 4/5 bilateral hip  flexor knee extensor right ankle dorsiflexor 2 - left ankle dorsiflexor  Ambulates with left   foot drop, no evidence of knee instability  Psychiatric:        Mood and Affect: Mood normal.        Behavior: Behavior normal.           Assessment & Plan:  #1.  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  We discussed left sacroiliac nerve blocks as a diagnostic tool to assess for the efficacy of left sacroiliac radiofrequency neurotomy.  We discussed that during the sacroiliac nerve block procedure we can assess the proximity of the spinal cord stimulator battery to our RF target sites. Patient wishes to proceed with the sacroiliac nerve blocks we will schedule.

## 2021-04-08 ENCOUNTER — Ambulatory Visit: Payer: PPO | Admitting: Physician Assistant

## 2021-04-08 ENCOUNTER — Other Ambulatory Visit: Payer: Self-pay

## 2021-04-08 ENCOUNTER — Ambulatory Visit (HOSPITAL_COMMUNITY)
Admission: RE | Admit: 2021-04-08 | Discharge: 2021-04-08 | Disposition: A | Discharge status: Home / Self Care | Payer: PPO | Source: Ambulatory Visit | Attending: Surgery | Admitting: Surgery

## 2021-04-08 ENCOUNTER — Encounter: Payer: Self-pay | Admitting: Internal Medicine

## 2021-04-08 VITALS — BP 160/80 | HR 43 | Temp 97.9°F | Resp 20 | Ht 70.0 in | Wt 169.0 lb

## 2021-04-08 DIAGNOSIS — I714 Abdominal aortic aneurysm, without rupture, unspecified: Secondary | ICD-10-CM

## 2021-04-08 NOTE — Progress Notes (Signed)
History of Present Illness:  Patient is a 80 y.o. year old male who presents for evaluation of abdominal aortic aneurysm.  The aneurysm is currently 4.99cm in diameter by [place].  He denise increased lumbar or abdominal pain.  He does have significant constipation.  He states he just doesn't;t feel strong enough to push his BM out.    The AAA was found incidentally.  He has hx of multiple back procedures. He has a nerve stimulator implant in back and is also goes to pain management for his back. This continues to be his biggest complaint.    The pt on a statin for cholesterol management.  The pt on a daily aspirin. Other AC: none The pt on metoprolol And lisinopril for hypertension.  The ptis notdiabetic.  Tobacco DX:AJOINOM smoker, 1/2 ppd    Past Medical History:  Diagnosis Date  . AAA (abdominal aortic aneurysm) ([place])   . Back pain    decompression belt bought off of TV  . CAD (coronary artery disease)   . Carotid artery disease ([place])    a. duplex 2015 - 1-39% bilaterally.  . Carotid bruit    November, 2013  . Cataract    bilateral - surgery to remove  . DDD (degenerative disc disease), lumbar    hands  . Dyslipidemia   . GERD (gastroesophageal reflux disease)   . History of kidney stones   . HTN (hypertension)   . Hx of CABG    2004  . MGUS (monoclonal gammopathy of unknown significance)   . Myocardial infarction ([place])   . 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 Spinal stenosis, lumbar    3-4  . Thrombocytopenia ([place])    Dr. Ralene Ok    Past Surgical History:  Procedure Laterality Date  . BACK SURGERY    . COLONOSCOPY    . COLONOSCOPY WITH PROPOFOL N/A 07/13/2017   Procedure: COLONOSCOPY WITH PROPOFOL;  Surgeon: Manus Gunning, MD;  [facility];  Service: Gastroenterology;  Laterality: N/A;  . CORONARY ARTERY BYPASS GRAFT  2004  . CYSTOSCOPY WITH INSERTION OF UROLIFT N/A 12/14/2018   Procedure:  CYSTOSCOPY WITH INSERTION OF UROLIFT;  Surgeon: Franchot Gallo, MD;  Location[facility];  Service: Urology;  Laterality: N/A;  . CYSTOSCOPY WITH INSERTION OF UROLIFT N/A 09/04/2020   Procedure: CYSTOSCOPY WITH INSERTION OF UROLIFT;  Surgeon: Franchot Gallo, MD;  Location[facility];  Service: Urology;  Laterality: N/A;  . DECOMPRESSIVE LUMBAR LAMINECTOMY LEVEL 1 N/A 09/04/2016   Procedure: DECOMPRESSION L3 - L4;  Surgeon: Melina Schools, MD;  Location: [facility];  Service: Orthopedics;  Laterality: N/A;  . EYE SURGERY  2013   bilateral cataract extraction w/ IOL (Dr. Celene Squibb), retina surgery  . JOINT REPLACEMENT    . LUMBAR LAMINECTOMY/DECOMPRESSION MICRODISCECTOMY  09/04/2016   L3-4  . SPINAL CORD STIMULATOR INSERTION N/A 05/13/2019   Procedure: LUMBAR SPINAL CORD STIMULATOR INSERTION;  Surgeon: Clydell Hakim, MD;  Location: [place];  Service: Neurosurgery;  Laterality: N/A;  LUMBAR SPINAL CORD STIMULATOR INSERTION  . TOTAL HIP ARTHROPLASTY Right 10/2003     Social History Social History   Tobacco Use  . Smoking status: Heavy Tobacco Smoker    Packs/day: 0.50    Years: 65.00    Pack years: 32.50    Types: Cigarettes  . Smokeless tobacco: Never Used  . Tobacco comment: smoking since age 52 yrs old  Vaping Use  . Vaping Use:  Never used  Substance Use Topics  . Alcohol use: Not Currently  . Drug use: No    Family History Family History  Problem Relation Age of Onset  . Kidney disease Mother   . COPD Mother   . Stroke Father        cerebral hemorrhage  . Heart disease Brother   . Heart disease Brother   . Arthritis Brother   . Cancer Paternal Grandfather     Allergies  Allergies  Allergen Reactions  . Penicillins Other (See Comments)    PATIENT HAS HAD A PCN REACTION WITH IMMEDIATE RASH, FACIAL/TONGUE/THROAT SWELLING, SOB, OR LIGHTHEADEDNESS WITH HYPOTENSION:   #  YES  #    Has patient had a PCN reaction causing severe rash involving mucus membranes or skin necrosis: No Has  patient had a PCN reaction that required hospitalization No Has patient had a PCN reaction occurring within the last 10 years: No If all of the above answers are "NO", then may proceed with Cephalosporin use.  Passed out     Current Outpatient Medications  Medication Sig Dispense Refill  . aspirin 81 MG tablet Take 1 tablet (81 mg total) by mouth daily. 90 tablet 3  . betamethasone valerate ointment (VALISONE) 0.1 % Apply 1 application topically 2 (two) times daily. 452 g 2  . carvedilol (COREG) 12.5 MG tablet Take 1 tablet (12.5 mg total) by mouth 2 (two) times daily with a meal. 180 tablet 1  . docusate sodium (COLACE) 50 MG capsule Take 50 mg by mouth at bedtime.     Marland Kitchen doxycycline (VIBRA-TABS) 100 MG tablet Take 1 tablet (100 mg total) by mouth 2 (two) times daily. 14 tablet 0  . esomeprazole (NEXIUM) 20 MG capsule Take 20 mg by mouth daily at 12 noon.    Marland Kitchen ipratropium (ATROVENT) 0.03 % nasal spray Place 2 sprays into both nostrils every 12 (twelve) hours. 30 mL 12  . lisinopril (ZESTRIL) 40 MG tablet TAKE 1 TABLET BY MOUTH EVERY DAY 90 tablet 2  . montelukast (SINGULAIR) 10 MG tablet TAKE 1 TABLET BY MOUTH EVERYDAY AT BEDTIME 90 tablet 0  . Multiple Vitamins-Minerals (CENTRUM SILVER ULTRA MENS PO) Take 1 tablet by mouth daily.    . nitroGLYCERIN (NITROSTAT) 0.4 MG SL tablet Place 1 tablet (0.4 mg total) under the tongue every 5 (five) minutes as needed for chest pain. 25 tablet 3  . predniSONE (DELTASONE) 20 MG tablet Take 2 tablets (40 mg total) by mouth daily with breakfast. 10 tablet 0  . rosuvastatin (CRESTOR) 20 MG tablet Take 1 tablet (20 mg total) by mouth daily. 90 tablet 2  . tamsulosin (FLOMAX) 0.4 MG CAPS capsule Take 2 capsules (0.8 mg total) by mouth daily. 60 capsule 11  . traMADol (ULTRAM) 50 MG tablet Take 50 mg by mouth every 4 (four) hours as needed for moderate pain.      No current facility-administered medications for this visit.    ROS:   General:  No weight  loss, Fever, chills  HEENT: No recent headaches, no nasal bleeding, no visual changes, no sore throat  Neurologic: No dizziness, blackouts, seizures. No recent symptoms of stroke or mini- stroke. No recent episodes of slurred speech, or temporary blindness.  Cardiac: No recent episodes of chest pain/pressure, no shortness of breath at rest.  No shortness of breath with exertion.  Denies history of atrial fibrillation or irregular heartbeat  Vascular: No history of rest pain in feet.  No history of claudication.  No history of non-healing ulcer, No history of DVT   Pulmonary: No home oxygen, no productive cough, no hemoptysis,  No asthma or wheezing  Musculoskeletal:  [ ]  Arthritis, [x ] Low back pain,  [ ]  Joint pain  Hematologic:No history of hypercoagulable state.  No history of easy bleeding.  No history of anemia  Gastrointestinal: No hematochezia or melena,  No gastroesophageal reflux, no trouble swallowing  Urinary: [ ]  chronic Kidney disease, [ ]  on HD - [ ]  MWF or [ ]  TTHS, [ ]  Burning with urination, [ ]  Frequent urination, [ ]  Difficulty urinating;   Skin: No rashes  Psychological: No history of anxiety,  No history of depression   Physical Examination  Vitals:   04/08/21 0841  BP: (!) 160/80  Pulse: (!) 43  Resp: 20  Temp: 97.9 F (36.6 C)  TempSrc: Temporal  SpO2: 97%  Weight: 169 lb (76.7 kg)  Height: 5\' 10"  (1.778 m)    Body mass index is 24.25 kg/m.  General:  Alert and oriented, no acute distress HEENT: Normal Neck: No bruit or JVD Pulmonary: Clear to auscultation bilaterally Cardiac: Regular Rate and Rhythm without murmur Gastrointestinal: Soft, non-tender, non-distended, no mass, no scars.  + BS to auscultation Skin: No rash Extremity Pulses:  2+ radial, brachial, femoral, non palpable dorsalis pedis, posterior tibial pulses bilaterally.  Feet are warm and motor is intact without evidence of non healing wounds.   Musculoskeletal: No deformity or  edema  Neurologic: Upper and lower extremity motor 5/5 and symmetric  DATA:     Abdominal Aorta Findings:  +-----------+-------+----------+----------+--------+--------+--------------  +  Location  AP (cm)Trans (cm)PSV (cm/s)WaveformThrombusComments       +-----------+-------+----------+----------+--------+--------+--------------  +  Proximal                        Not  visualized  +-----------+-------+----------+----------+--------+--------+--------------  +  Mid    4.44  5.09   76                       +-----------+-------+----------+----------+--------+--------+--------------  +  Distal   3.10  3.26   69                       +-----------+-------+----------+----------+--------+--------+--------------  +  RT CIA Prox1.9  2.3    65                       +-----------+-------+----------+----------+--------+--------+--------------  +  LT CIA Prox1.0  1.2    88                       +-----------+-------+----------+----------+--------+--------+--------------  +   Visualization of the Proximal Abdominal Aorta was limited.     Summary:  Abdominal Aorta: There is evidence of abnormal dilatation of the mid and  distal Abdominal aorta. There is evidence of abnormal dilation of the  Right Common Iliac artery with a maximum diameter of 2.3 cm. The largest  aortic measurement is 5.1 cm. Previous  diameter measurement was 5.0 cm obtained on 10/09/2020.      ASSESSMENT: AAA asymptomatic The AAA has increased in size by 0.1 cm in 6 months.      PLAN: He will f/u in 6 months for repeat duplex.  If the AAA increased by 1.0 cm we will order a CTA.  If he develops sever lumbar or abdominal pain he will call 911.  I advised  him to drink plenty of water and take a stool softener until he can f/u with his  PCP.   Roxy Horseman PA-C [facility] Office: [phone]  MD in clinic [place]

## 2021-04-11 ENCOUNTER — Other Ambulatory Visit: Payer: Self-pay

## 2021-04-11 DIAGNOSIS — I714 Abdominal aortic aneurysm, without rupture, unspecified: Secondary | ICD-10-CM

## 2021-04-22 ENCOUNTER — Other Ambulatory Visit: Payer: Self-pay | Admitting: Internal Medicine

## 2021-05-06 ENCOUNTER — Other Ambulatory Visit: Payer: Self-pay | Admitting: Internal Medicine

## 2021-05-09 ENCOUNTER — Encounter: Payer: PPO | Attending: Physical Medicine & Rehabilitation | Admitting: Physical Medicine & Rehabilitation

## 2021-05-09 ENCOUNTER — Other Ambulatory Visit: Payer: Self-pay

## 2021-05-09 ENCOUNTER — Encounter: Payer: Self-pay | Admitting: Physical Medicine & Rehabilitation

## 2021-05-09 VITALS — BP 155/94 | HR 72 | Temp 97.9°F | Ht 70.0 in | Wt 169.6 lb

## 2021-05-09 DIAGNOSIS — M533 Sacrococcygeal disorders, not elsewhere classified: Secondary | ICD-10-CM | POA: Diagnosis not present

## 2021-05-09 DIAGNOSIS — G8929 Other chronic pain: Secondary | ICD-10-CM | POA: Diagnosis not present

## 2021-05-09 NOTE — Patient Instructions (Signed)
Sacroiliac nerve block injections performed today. ? ?The injection was done under x-ray guidance with contrast enhancement. ?This procedure has been performed to help reduce low back and buttocks pain as well as potentially hip pain. ?The duration of this injection is variable lasting from hours to  weeks ?If this injection produces a >50%  short term relief of pain, then a radiofrequency ablation of these same nerves may result in a 6mo + pain relief  ?

## 2021-05-09 NOTE — Progress Notes (Signed)
  PROCEDURE RECORD [facility]   Name: [name] [name] DOB:06-Sep-1941 MRN: [id number]  Date:05/09/2021  Physician: Alysia Penna, MD    Nurse/CMA: Truman Hayward, CMA  Allergies:  Allergies  Allergen Reactions  . Penicillins Other (See Comments)    PATIENT HAS HAD A PCN REACTION WITH IMMEDIATE RASH, FACIAL/TONGUE/THROAT SWELLING, SOB, OR LIGHTHEADEDNESS WITH HYPOTENSION:   #  YES  #    Has patient had a PCN reaction causing severe rash involving mucus membranes or skin necrosis: No Has patient had a PCN reaction that required hospitalization No Has patient had a PCN reaction occurring within the last 10 years: No If all of the above answers are "NO", then may proceed with Cephalosporin use.  Passed out    Consent Signed: Yes.    Is patient diabetic? No.  CBG today?   Pregnant: No. LMP: No LMP for male patient. (age 71-55)  Anticoagulants: yes (ASA) Anti-inflammatory: no Antibiotics: no  Procedure: Left Sacroiliac Nerve Block Position: Prone Start Time: 11:09am End Time: 11:17am Fluoro Time: 50  RN/CMA Truman Hayward, CMA Cebert Dettmann, CMA    Time 10:45am 11:30am    BP 155/94 175/96    Pulse 72 81    Respirations 16 16    O2 Sat 98 96    S/S 6 6    Pain Level 8/10 2/10     D/C home with wife, patient A & O X 3, D/C instructions reviewed, and sits independently.

## 2021-05-09 NOTE — Progress Notes (Addendum)
Left L5 dorsal ramus S1-S2-S3 lateral branch blocks under fluoroscopic guidance Left side  Informed consent was obtained after describing risks and benefits of the procedure with patient these include bleeding bruising and infection.  He elects to proceed and has given written consent.  Patient placed prone on fluoroscopy table Betadine prep sterile drape a 25-gauge 1.5 inch needle was used to anesthetize skin and subcu tissue with 1% lidocaine 1 cc into each of 4 sites.  Then a 22-gauge 3.5" needle was inserted under fluoroscopic guidance for starting the S1 SAP sacral ala junction.  Bone contact made.  Isovue 200 x 0.5 mL demonstrated no intravascular uptake then 0.5 mL of 2% lidocaine was injected.  Then the lateral aspect of the S1, S2, S3 foramen was targeted.  Bone contact made out, Isovue-200 times 0.5 mL demonstrated no nerve root or intravascular uptake within 0.5 mL of 2% lidocaine solution was injected after negative drawback for blood.  Patient tolerated procedure well.  Postinjection instructions given.   Preinjection pain Left low back /buttocks 8/10 Post injection pain at 49min was 2/10 and after 27min 0/10  Rec RF of Left sacroiliac next month

## 2021-05-29 ENCOUNTER — Other Ambulatory Visit: Payer: Self-pay

## 2021-05-29 ENCOUNTER — Inpatient Hospital Stay: Payer: PPO | Attending: Internal Medicine

## 2021-05-29 ENCOUNTER — Other Ambulatory Visit: Payer: PPO

## 2021-05-29 ENCOUNTER — Inpatient Hospital Stay: Payer: PPO | Admitting: Internal Medicine

## 2021-05-29 VITALS — BP 152/87 | HR 77 | Temp 97.7°F | Resp 18 | Ht 70.0 in | Wt 168.2 lb

## 2021-05-29 DIAGNOSIS — K219 Gastro-esophageal reflux disease without esophagitis: Secondary | ICD-10-CM | POA: Insufficient documentation

## 2021-05-29 DIAGNOSIS — Z79899 Other long term (current) drug therapy: Secondary | ICD-10-CM | POA: Insufficient documentation

## 2021-05-29 DIAGNOSIS — I4510 Unspecified right bundle-branch block: Secondary | ICD-10-CM | POA: Diagnosis not present

## 2021-05-29 DIAGNOSIS — D472 Monoclonal gammopathy: Secondary | ICD-10-CM | POA: Diagnosis not present

## 2021-05-29 DIAGNOSIS — K5900 Constipation, unspecified: Secondary | ICD-10-CM | POA: Insufficient documentation

## 2021-05-29 DIAGNOSIS — I2510 Atherosclerotic heart disease of native coronary artery without angina pectoris: Secondary | ICD-10-CM | POA: Diagnosis not present

## 2021-05-29 DIAGNOSIS — E785 Hyperlipidemia, unspecified: Secondary | ICD-10-CM | POA: Diagnosis not present

## 2021-05-29 DIAGNOSIS — I252 Old myocardial infarction: Secondary | ICD-10-CM | POA: Diagnosis not present

## 2021-05-29 DIAGNOSIS — M5136 Other intervertebral disc degeneration, lumbar region: Secondary | ICD-10-CM | POA: Insufficient documentation

## 2021-05-29 DIAGNOSIS — D696 Thrombocytopenia, unspecified: Secondary | ICD-10-CM | POA: Insufficient documentation

## 2021-05-29 DIAGNOSIS — I10 Essential (primary) hypertension: Secondary | ICD-10-CM | POA: Insufficient documentation

## 2021-05-29 DIAGNOSIS — R5383 Other fatigue: Secondary | ICD-10-CM | POA: Diagnosis not present

## 2021-05-29 DIAGNOSIS — Z7982 Long term (current) use of aspirin: Secondary | ICD-10-CM | POA: Diagnosis not present

## 2021-05-29 DIAGNOSIS — M48061 Spinal stenosis, lumbar region without neurogenic claudication: Secondary | ICD-10-CM | POA: Diagnosis not present

## 2021-05-29 DIAGNOSIS — I714 Abdominal aortic aneurysm, without rupture: Secondary | ICD-10-CM | POA: Insufficient documentation

## 2021-05-29 LAB — CBC WITH DIFFERENTIAL (CANCER CENTER ONLY)
Abs Immature Granulocytes: 0.02 10*3/uL (ref 0.00–0.07)
Basophils Absolute: 0.1 10*3/uL (ref 0.0–0.1)
Basophils Relative: 1 %
Eosinophils Absolute: 0.7 10*3/uL — ABNORMAL HIGH (ref 0.0–0.5)
Eosinophils Relative: 8 %
HCT: 41.1 % (ref 39.0–52.0)
Hemoglobin: 13.7 g/dL (ref 13.0–17.0)
Immature Granulocytes: 0 %
Lymphocytes Relative: 25 %
Lymphs Abs: 2.1 10*3/uL (ref 0.7–4.0)
MCH: 30.6 pg (ref 26.0–34.0)
MCHC: 33.3 g/dL (ref 30.0–36.0)
MCV: 91.9 fL (ref 80.0–100.0)
Monocytes Absolute: 0.7 10*3/uL (ref 0.1–1.0)
Monocytes Relative: 8 %
Neutro Abs: 4.8 10*3/uL (ref 1.7–7.7)
Neutrophils Relative %: 58 %
Platelet Count: 196 10*3/uL (ref 150–400)
RBC: 4.47 MIL/uL (ref 4.22–5.81)
RDW: 14 % (ref 11.5–15.5)
WBC Count: 8.4 10*3/uL (ref 4.0–10.5)
nRBC: 0 % (ref 0.0–0.2)

## 2021-05-29 LAB — LACTATE DEHYDROGENASE: LDH: 122 U/L (ref 98–192)

## 2021-05-29 NOTE — Progress Notes (Signed)
[place] Telephone:([phone]   Fax:(336) [phone]  OFFICE PROGRESS NOTE  [name], [place] [address]  DIAGNOSIS:  1) monoclonal history of undetermined significance. 2) thrombocytopenia  PRIOR THERAPY: None  CURRENT THERAPY: Observation  INTERVAL HISTORY: [name] [name] 80 y.o. male returns to the clinic today for annual follow-up visit.  The patient is feeling fine today with no concerning complaints except for mild fatigue and constipation.  He denied having any current chest pain, shortness of breath except with exertion with no cough or hemoptysis.  He denied having any fever or chills.  He has no nausea, vomiting, abdominal pain or diarrhea.  He has no significant weight loss or night sweats.  The patient is here today for evaluation and repeat blood work.  MEDICAL HISTORY: Past Medical History:  Diagnosis Date   AAA (abdominal aortic aneurysm) ([place])    Back pain    decompression belt bought off of TV   CAD (coronary artery disease)    Carotid artery disease ([place])    a. duplex 2015 - 1-39% bilaterally.   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 Ralene Ok    ALLERGIES:  is allergic to penicillins.  MEDICATIONS:  Current Outpatient Medications  Medication Sig Dispense Refill   aspirin 81 MG tablet Take 1 tablet (81 mg total) by mouth daily. 90 tablet 3   betamethasone valerate ointment (VALISONE) 0.1 % Apply 1 application topically 2 (two) times daily. 452 g 2    carvedilol (COREG) 12.5 MG tablet Take 1 tablet (12.5 mg total) by mouth 2 (two) times daily with a meal. 180 tablet 1   docusate sodium (COLACE) 50 MG capsule Take 50 mg by mouth at bedtime.      doxycycline (VIBRA-TABS) 100 MG tablet Take 1 tablet (100 mg total) by mouth 2 (two) times daily. 14 tablet 0   esomeprazole (NEXIUM) 20 MG capsule Take 20 mg by mouth daily at 12 noon.     ipratropium (ATROVENT) 0.03 % nasal spray SPRAY 2 SPRAYS INTO EACH NOSTRIL EVERY 12 HOURS 30 mL 4   lisinopril (ZESTRIL) 40 MG tablet TAKE 1 TABLET BY MOUTH EVERY DAY 90 tablet 2   montelukast (SINGULAIR) 10 MG tablet TAKE 1 TABLET BY MOUTH EVERYDAY AT BEDTIME 90 tablet 0   Multiple Vitamins-Minerals (CENTRUM SILVER ULTRA MENS PO) Take 1 tablet by mouth daily.     nitroGLYCERIN (NITROSTAT) 0.4 MG SL tablet Place 1 tablet (0.4 mg total) under the tongue every 5 (five) minutes as needed for chest pain. 25 tablet 3   predniSONE (DELTASONE) 20 MG tablet Take 2 tablets (40 mg total) by mouth daily with breakfast. 10 tablet 0   rosuvastatin (CRESTOR) 20 MG tablet Take 1 tablet (20 mg total) by mouth daily. 90 tablet 2   tamsulosin (FLOMAX) 0.4 MG CAPS capsule Take 2 capsules (0.8 mg total)  by mouth daily. 60 capsule 11   traMADol (ULTRAM) 50 MG tablet Take 50 mg by mouth every 4 (four) hours as needed for moderate pain.      No current facility-administered medications for this visit.    SURGICAL HISTORY:  Past Surgical History:  Procedure Laterality Date   BACK SURGERY     COLONOSCOPY     COLONOSCOPY WITH PROPOFOL N/A 07/13/2017   Procedure: COLONOSCOPY WITH PROPOFOL;  Surgeon: Manus Gunning, MD;  [facility];  Service: Gastroenterology;  Laterality: N/A;   CORONARY ARTERY BYPASS GRAFT  2004   CYSTOSCOPY WITH INSERTION OF UROLIFT N/A 12/14/2018   Procedure: CYSTOSCOPY WITH INSERTION OF UROLIFT;  Surgeon: Franchot Gallo, MD;  [facility];  Service: Urology;  Laterality: N/A;   CYSTOSCOPY  WITH INSERTION OF UROLIFT N/A 09/04/2020   Procedure: CYSTOSCOPY WITH INSERTION OF UROLIFT;  Surgeon: Franchot Gallo, MD;  Location[facility];  Service: Urology;  Laterality: N/A;   DECOMPRESSIVE LUMBAR LAMINECTOMY LEVEL 1 N/A 09/04/2016   Procedure: DECOMPRESSION L3 - L4;  Surgeon: Melina Schools, MD;  Location: [place];  Service: Orthopedics;  Laterality: N/A;   EYE SURGERY  2013   bilateral cataract extraction w/ IOL (Dr. Celene Squibb), retina surgery   JOINT REPLACEMENT     LUMBAR LAMINECTOMY/DECOMPRESSION MICRODISCECTOMY  09/04/2016   L3-4   SPINAL CORD STIMULATOR INSERTION N/A 05/13/2019   Procedure: LUMBAR SPINAL CORD STIMULATOR INSERTION;  Surgeon: Clydell Hakim, MD;  Location: [place];  Service: Neurosurgery;  Laterality: N/A;  LUMBAR SPINAL CORD STIMULATOR INSERTION   TOTAL HIP ARTHROPLASTY Right 10/2003    REVIEW OF SYSTEMS:  A comprehensive review of systems was negative except for: Constitutional: positive for fatigue Gastrointestinal: positive for constipation Musculoskeletal: positive for arthralgias   PHYSICAL EXAMINATION: General appearance: alert, cooperative and no distress Head: Normocephalic, without obvious abnormality, atraumatic Neck: no adenopathy, no JVD, supple, symmetrical, trachea midline and thyroid not enlarged, symmetric, no tenderness/mass/nodules Lymph nodes: Cervical, supraclavicular, and axillary nodes normal. Resp: clear to auscultation bilaterally Back: symmetric, no curvature. ROM normal. No CVA tenderness. Cardio: regular rate and rhythm, S1, S2 normal, no murmur, click, rub or gallop GI: soft, non-tender; bowel sounds normal; no masses,  no organomegaly Extremities: extremities normal, atraumatic, no cyanosis or edema  ECOG PERFORMANCE STATUS: 1 - Symptomatic but completely ambulatory  Blood pressure (!) 152/87, pulse 77, temperature 97.7 F (36.5 C), temperature source Tympanic, resp. rate 18, height 5\' 10"  (1.778 m), weight 168 lb 3.2 oz (76.3 kg), SpO2 99  %.  LABORATORY DATA: Lab Results  Component Value Date   WBC 9.3 06/05/2020   HGB 15.1 06/05/2020   HCT 45.7 06/05/2020   MCV 93.6 06/05/2020   PLT 199 06/05/2020      Chemistry      Component Value Date/Time   NA 138 06/05/2020 1039   NA 139 06/04/2017 1031   K 4.3 06/05/2020 1039   K 5.1 06/04/2017 1031   CL 106 06/05/2020 1039   CL 108 (H) 05/23/2013 1514   CO2 24 06/05/2020 1039   CO2 26 06/04/2017 1031   BUN 13 06/05/2020 1039   BUN 12.0 06/04/2017 1031   CREATININE 0.85 06/05/2020 1039   CREATININE 0.8 06/04/2017 1031      Component Value Date/Time   CALCIUM 9.3 06/05/2020 1039   CALCIUM 9.5 06/04/2017 1031   ALKPHOS 85 06/05/2020 1039   ALKPHOS 77 06/04/2017 1031   AST 21 06/05/2020 1039   AST 18 06/04/2017 1031  ALT 17 06/05/2020 1039   ALT 16 06/04/2017 1031   BILITOT 0.5 06/05/2020 1039   BILITOT 0.44 06/04/2017 1031       RADIOGRAPHIC STUDIES: No results found.  ASSESSMENT AND PLAN:  This is a very pleasant 80 years old white male with history of monoclonal gammopathy as well as thrombocytopenia he is currently on observation.  The patient is doing fine today with no concerning complaints except for arthralgia and intermittent constipation. Repeat CBC today is unremarkable.  Myeloma panel is still pending. I recommended for the patient to continue on observation with repeat myeloma panel in 1 year.  If there is any abnormality on the pending blood work I will call the patient with recommendation. He was advised to call immediately if he has any other concerning symptoms in the interval. The patient voices understanding of current disease status and treatment options and is in agreement with the current care plan. All questions were answered. The patient knows to call the clinic with any problems, questions or concerns. We can certainly see the patient much sooner if necessary.  Disclaimer: This note was dictated with voice recognition software.  Similar sounding words can inadvertently be transcribed and may not be corrected upon review.

## 2021-05-30 LAB — IGG, IGA, IGM
IgA: 28 mg/dL — ABNORMAL LOW (ref 61–437)
IgG (Immunoglobin G), Serum: 650 mg/dL (ref 603–1613)
IgM (Immunoglobulin M), Srm: 16 mg/dL (ref 15–143)

## 2021-05-30 LAB — BETA 2 MICROGLOBULIN, SERUM: Beta-2 Microglobulin: 1.9 mg/L (ref 0.6–2.4)

## 2021-06-05 ENCOUNTER — Ambulatory Visit: Payer: PPO | Admitting: Internal Medicine

## 2021-06-11 ENCOUNTER — Other Ambulatory Visit: Payer: Self-pay

## 2021-06-11 ENCOUNTER — Encounter: Payer: Self-pay | Admitting: Urology

## 2021-06-11 ENCOUNTER — Ambulatory Visit: Payer: PPO | Admitting: Urology

## 2021-06-11 VITALS — BP 165/84 | HR 83 | Wt 168.0 lb

## 2021-06-11 DIAGNOSIS — R350 Frequency of micturition: Secondary | ICD-10-CM

## 2021-06-11 DIAGNOSIS — R339 Retention of urine, unspecified: Secondary | ICD-10-CM | POA: Diagnosis not present

## 2021-06-11 DIAGNOSIS — R3915 Urgency of urination: Secondary | ICD-10-CM

## 2021-06-11 DIAGNOSIS — N401 Enlarged prostate with lower urinary tract symptoms: Secondary | ICD-10-CM | POA: Diagnosis not present

## 2021-06-11 LAB — BLADDER SCAN AMB NON-IMAGING: Scan Result: 232

## 2021-06-11 NOTE — Progress Notes (Signed)
post void residual=232

## 2021-06-11 NOTE — Progress Notes (Signed)
Urological Symptom Review  Patient is experiencing the following symptoms: Hard to postpone urination Get up at night to urinate Stream starts and stops Trouble starting stream Weak stream   Review of Systems  Gastrointestinal (upper)  : Negative for upper GI symptoms  Gastrointestinal (lower) : Constipation  Constitutional : Negative for symptoms  Skin: Negative for skin symptoms  Eyes: Negative for eye symptoms  Ear/Nose/Throat : Negative for Ear/Nose/Throat symptoms  Hematologic/Lymphatic: Easy bruising  Cardiovascular : Negative for cardiovascular symptoms  Respiratory : Negative for respiratory symptoms  Endocrine: Negative for endocrine symptoms  Musculoskeletal: Back pain  Neurological: Negative for neurological symptoms  Psychologic: Negative for psychiatric symptoms

## 2021-06-11 NOTE — Progress Notes (Signed)
History of Present Illness: Follow-up of BPH with incomplete emptying.  [phone]: This patient is here today for follow-up of BPH, status post treatment with UroLift x2.  He is still having significant issues with voiding.  At times, he voids over 900 mL at night, with him waking up 7 times.  The average number of visits to the toilet at night are 3.  He does wake up a lot with pain in his back and hip and this is when he feels like he has to void.  He does have daytime frequency and urgency.  Stream is moderate.  He is off of tamsulosin.  7.5.2022: He has had slow stream which usually gets better with a bowel movement.  When his back pain is worse, he has worse problems with slow stream and feeling of incomplete emptying.  He is still off of tamsulosin.  Current IPSS 19, quality-of-life score 4.  Nocturia x3.  IPSS Questionnaire (AUA-7): Over the past month.   1)  How often have you had a sensation of not emptying your bladder completely after you finish urinating?  2 - Less than half the time  2)  How often have you had to urinate again less than two hours after you finished urinating? 2 - Less than half the time  3)  How often have you found you stopped and started again several times when you urinated?  3 - About half the time  4) How difficult have you found it to postpone urination?  1 - Less than 1 time in 5  5) How often have you had a weak urinary stream?  5 - Almost always  6) How often have you had to push or strain to begin urination?  3 - About half the time  7) How many times did you most typically get up to urinate from the time you went to bed until the time you got up in the morning?  3 - 3 times  Total score:  0-7 mildly symptomatic   8-19 moderately symptomatic   20-35 severely symptomatic     Past Medical History:  Diagnosis Date   AAA (abdominal aortic aneurysm) (HCC)    Back pain    decompression belt bought off of TV   CAD (coronary artery disease)    Carotid artery  disease ([place])    a. duplex 2015 - 1-39% bilaterally.   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 Ralene Ok    Past Surgical History:  Procedure Laterality Date   BACK SURGERY     COLONOSCOPY     COLONOSCOPY WITH PROPOFOL N/A 07/13/2017   Procedure: COLONOSCOPY WITH PROPOFOL;  Surgeon: Manus Gunning, MD;  Location: [facility];  Service: Gastroenterology;  Laterality: N/A;   CORONARY ARTERY BYPASS GRAFT  2004   CYSTOSCOPY WITH INSERTION OF UROLIFT N/A 12/14/2018   Procedure: CYSTOSCOPY WITH INSERTION OF UROLIFT;  Surgeon: Franchot Gallo, MD;  Location:  [facility];  Service: Urology;  Laterality: N/A;   CYSTOSCOPY WITH INSERTION OF UROLIFT N/A 09/04/2020   Procedure: CYSTOSCOPY WITH INSERTION OF UROLIFT;  Surgeon: Franchot Gallo, MD;  Location: [facility];  Service: Urology;  Laterality: N/A;   DECOMPRESSIVE LUMBAR LAMINECTOMY LEVEL 1 N/A 09/04/2016   Procedure: DECOMPRESSION L3 - L4;  Surgeon: Melina Schools, MD;  Location: [facility];  Service: Orthopedics;  Laterality: N/A;   EYE SURGERY  2013   bilateral cataract extraction w/ IOL (Dr. Celene Squibb), retina surgery   JOINT REPLACEMENT     LUMBAR LAMINECTOMY/DECOMPRESSION MICRODISCECTOMY  09/04/2016   L3-4   SPINAL CORD STIMULATOR INSERTION N/A 05/13/2019   Procedure: LUMBAR SPINAL CORD STIMULATOR INSERTION;  Surgeon: Clydell Hakim, MD;  Location: [facility];  Service: Neurosurgery;  Laterality: N/A;  LUMBAR SPINAL CORD STIMULATOR INSERTION   TOTAL HIP ARTHROPLASTY Right 10/2003    Home Medications:   Allergies as of 06/11/2021       Reactions   Penicillins Other (See Comments)   PATIENT HAS HAD A PCN REACTION WITH IMMEDIATE RASH, FACIAL/TONGUE/THROAT SWELLING, SOB, OR LIGHTHEADEDNESS WITH HYPOTENSION:   #  YES  #    Has patient had a PCN reaction causing severe rash involving mucus membranes or skin necrosis: No Has patient had a PCN reaction that required hospitalization No Has patient had a PCN reaction occurring within the last 10 years: No If all of the above answers are "NO", then may proceed with Cephalosporin use. Passed out        Medication List        Accurate as of June 11, 2021 12:48 PM. If you have any questions, ask your nurse or doctor.          aspirin 81 MG tablet Take 1 tablet (81 mg total) by mouth daily.   betamethasone valerate ointment 0.1 % Commonly known as: VALISONE Apply 1 application topically 2 (two) times daily.   carvedilol 12.5 MG tablet Commonly known as: COREG Take 1 tablet (12.5 mg total) by mouth 2 (two) times daily with a meal.   CENTRUM SILVER ULTRA MENS PO Take 1 tablet by mouth daily.   docusate sodium 50 MG capsule Commonly known as: COLACE Take 50 mg by mouth at bedtime.   doxycycline 100 MG tablet Commonly known as: VIBRA-TABS Take 1 tablet (100 mg total) by mouth 2 (two) times daily.   esomeprazole 20 MG capsule Commonly known as: NEXIUM Take 20 mg by mouth daily at 12 noon.   ipratropium 0.03 % nasal spray Commonly known as: ATROVENT SPRAY 2 SPRAYS INTO EACH NOSTRIL EVERY 12 HOURS   lisinopril 40 MG tablet Commonly known as: ZESTRIL TAKE 1 TABLET BY MOUTH EVERY DAY   montelukast 10 MG tablet Commonly known as: SINGULAIR TAKE 1 TABLET BY MOUTH EVERYDAY AT BEDTIME   nitroGLYCERIN 0.4 MG SL tablet Commonly known as: NITROSTAT Place 1 tablet (0.4 mg total) under the tongue every 5 (five) minutes as needed for chest pain.   predniSONE 20 MG tablet Commonly known as: DELTASONE Take 2 tablets (40 mg total) by  mouth daily with breakfast.   rosuvastatin 20 MG tablet Commonly known as: CRESTOR Take 1 tablet (20 mg total) by mouth daily.   tamsulosin 0.4 MG Caps capsule Commonly known as: FLOMAX Take 2 capsules (0.8 mg total) by mouth daily.   traMADol 50 MG tablet Commonly known as: ULTRAM Take 50 mg by mouth every 4 (four) hours as needed for moderate pain.  Allergies:  Allergies  Allergen Reactions   Penicillins Other (See Comments)    PATIENT HAS HAD A PCN REACTION WITH IMMEDIATE RASH, FACIAL/TONGUE/THROAT SWELLING, SOB, OR LIGHTHEADEDNESS WITH HYPOTENSION:   #  YES  #    Has patient had a PCN reaction causing severe rash involving mucus membranes or skin necrosis: No Has patient had a PCN reaction that required hospitalization No Has patient had a PCN reaction occurring within the last 10 years: No If all of the above answers are "NO", then may proceed with Cephalosporin use.  Passed out    Family History  Problem Relation Age of Onset   Kidney disease Mother    COPD Mother    Stroke Father        cerebral hemorrhage   Heart disease Brother    Heart disease Brother    Arthritis Brother    Cancer Paternal Grandfather     Social History:  reports that he has been smoking cigarettes. He has a 32.50 pack-year smoking history. He has never used smokeless tobacco. He reports previous alcohol use. He reports that he does not use drugs.  ROS: A complete review of systems was performed.  All systems are negative except for pertinent findings as noted.  Physical Exam:  Vital signs in last 24 hours: There were no vitals taken for this visit. Constitutional:  Alert and oriented, No acute distress Cardiovascular: Regular rate  Respiratory: Normal respiratory effort  Neurologic: Grossly intact, no focal deficits Psychiatric: Normal mood and affect  I have reviewed prior pt notes  I have reviewed notes from referring/previous physicians  I have reviewed urinalysis  results  I have independently reviewed prior imaging    Impression/Assessment:  BPH with incomplete emptying, failed 2 separate UroLift procedures.  I think, more than likely, his lower back issue, his spinal stimulator and several other factors are limiting his improvement  Plan:  1.  I need to get him back on tamsulosin  2.  If this works well enough, I will see him back annually

## 2021-06-14 DIAGNOSIS — M961 Postlaminectomy syndrome, not elsewhere classified: Secondary | ICD-10-CM | POA: Diagnosis not present

## 2021-06-14 DIAGNOSIS — M533 Sacrococcygeal disorders, not elsewhere classified: Secondary | ICD-10-CM | POA: Diagnosis not present

## 2021-06-14 DIAGNOSIS — M5136 Other intervertebral disc degeneration, lumbar region: Secondary | ICD-10-CM | POA: Diagnosis not present

## 2021-06-14 DIAGNOSIS — G894 Chronic pain syndrome: Secondary | ICD-10-CM | POA: Diagnosis not present

## 2021-06-18 ENCOUNTER — Encounter: Payer: Self-pay | Admitting: Urology

## 2021-06-18 ENCOUNTER — Other Ambulatory Visit: Payer: Self-pay

## 2021-06-18 DIAGNOSIS — N401 Enlarged prostate with lower urinary tract symptoms: Secondary | ICD-10-CM

## 2021-06-18 MED ORDER — TAMSULOSIN HCL 0.4 MG PO CAPS: 0.8000 mg | ORAL_CAPSULE | Freq: Every day | ORAL | 3 refills | Status: AC

## 2021-06-20 DIAGNOSIS — Z79899 Other long term (current) drug therapy: Secondary | ICD-10-CM | POA: Diagnosis not present

## 2021-06-20 DIAGNOSIS — Z5181 Encounter for therapeutic drug level monitoring: Secondary | ICD-10-CM | POA: Diagnosis not present

## 2021-06-21 ENCOUNTER — Other Ambulatory Visit: Payer: Self-pay

## 2021-06-21 ENCOUNTER — Encounter: Payer: PPO | Attending: Physical Medicine & Rehabilitation | Admitting: Physical Medicine & Rehabilitation

## 2021-06-21 ENCOUNTER — Encounter: Payer: Self-pay | Admitting: Physical Medicine & Rehabilitation

## 2021-06-21 VITALS — BP 118/74 | HR 89 | Temp 98.2°F | Ht 70.0 in | Wt 168.0 lb

## 2021-06-21 DIAGNOSIS — M533 Sacrococcygeal disorders, not elsewhere classified: Secondary | ICD-10-CM | POA: Insufficient documentation

## 2021-06-21 DIAGNOSIS — G8929 Other chronic pain: Secondary | ICD-10-CM | POA: Diagnosis not present

## 2021-06-21 NOTE — Patient Instructions (Signed)
You had a radio frequency procedure today This was done to alleviate joint pain in your lumbar area We injected lidocaine which is a local anesthetic.  You may experience soreness at the injection sites. You may also experienced some irritation of the nerves that were heated I'm recommending ice for 30 minutes every 2 hours as needed for the next 24-48 hours   

## 2021-06-21 NOTE — Progress Notes (Signed)
  [place]   Name: ADALBERT ALBERTO DOB:06-24-1941 MRN: [id number]  Date:06/21/2021  Physician: Alysia Penna, MD    Nurse/CMA: Shuronda Santino, CMA  Allergies:  Allergies  Allergen Reactions   Penicillins Other (See Comments)    PATIENT HAS HAD A PCN REACTION WITH IMMEDIATE RASH, FACIAL/TONGUE/THROAT SWELLING, SOB, OR LIGHTHEADEDNESS WITH HYPOTENSION:   #  YES  #    Has patient had a PCN reaction causing severe rash involving mucus membranes or skin necrosis: No Has patient had a PCN reaction that required hospitalization No Has patient had a PCN reaction occurring within the last 10 years: No If all of the above answers are "NO", then may proceed with Cephalosporin use.  Passed out    Consent Signed: Yes.    Is patient diabetic? No.  CBG today? .   Pregnant: No. LMP: No LMP for male patient. (age 87-55)  Anticoagulants: no Anti-inflammatory: no Antibiotics: no  Procedure: left sacroiliac radiofrequency ablation  Position: Prone Start Time: 1:05pm   End Time: 1:20pm  Fluoro Time: 35s  RN/CMA Lachlan Mckim, CMA Hardin Hardenbrook, CMA    Time 12:50pm 1:25pm    BP 118/74 121/79    Pulse 89 95    Respirations 16 16    O2 Sat 96 97    S/S 6 6    Pain Level 7/10 0/10     D/C home with wife, patient A & O X 3, D/C instructions reviewed, and sits independently.

## 2021-06-21 NOTE — Progress Notes (Signed)
Left sacroiliac radio frequency ablation under fluoroscopic guidance This consists of L5 dorsal ramus radio frequency ablation plus neuro lysis of S1-S2 -S3 lateral branches  Indication is sacroiliac pain which has improved temporarily onby at least 50% following sacroiliac intra-articular injection and L5 , S1,2,3 Lateral branch blocks under fluoroscopic guidance. Pain interferes with self-care and mobility and has failed to respond to conservative measures.  Informed consent was obtained after discussing risks and benefits of the procedure with the patient these include bleeding bruising and infection temporary or permanent paralysis. The patient elects to proceed and has given written consent.  Patient placed prone on fluoroscopy table. Area marked and prepped with Betadine. Fluoroscopic images utilized to guide needle. 25-gauge 1.5 inch needle was used to anesthetize 4 injection points with 2 cc of 1% lidocaine each. Then a 18-gauge 10 cm RF needle with a 10 mm curved active tip was inserted under fluoroscopic guidance first targeting the S1 SA P./sacral ala junction, bone contact made and confirmed with lateral imaging.  motor stimulation at 2 Hz confirm proper needle location followed by injection of one cc of a solution containing   1% lidocaine MPF. Radio frequency 80C for 80 seconds was performed. Then the inferolateral aspect of the S1, S2 and lateral aspect of S3 sacral foramina were targeted. Bone contact made.  motor stim at 2 Hz confirm proper needle location. One ML of the /lidocaine solution was injected into each of 3 sites and radio frequency ablation 80C for 90 seconds was performed. Patient tolerated procedure well. Post procedure instructions given

## 2021-07-22 ENCOUNTER — Telehealth: Payer: Self-pay | Admitting: Emergency Medicine

## 2021-07-22 NOTE — Telephone Encounter (Signed)
Double for this afternoon? Please advise

## 2021-07-22 NOTE — Telephone Encounter (Signed)
He is outside of the 5 day window if he tested positive last Wednesday. Okay to offer supportive care instructions or virtual visit if desired with someone free.

## 2021-07-29 ENCOUNTER — Other Ambulatory Visit: Payer: Self-pay | Admitting: *Deleted

## 2021-07-29 MED ORDER — CARVEDILOL 12.5 MG PO TABS: 12.5000 mg | ORAL_TABLET | Freq: Two times a day (BID) | ORAL | 1 refills | Status: DC

## 2021-08-07 ENCOUNTER — Other Ambulatory Visit: Payer: Self-pay | Admitting: *Deleted

## 2021-08-07 MED ORDER — LISINOPRIL 40 MG PO TABS: 40.0000 mg | ORAL_TABLET | Freq: Every day | ORAL | 2 refills | Status: AC

## 2021-08-26 ENCOUNTER — Other Ambulatory Visit: Payer: Self-pay | Admitting: *Deleted

## 2021-08-26 ENCOUNTER — Other Ambulatory Visit: Payer: Self-pay | Admitting: Internal Medicine

## 2021-08-26 MED ORDER — ROSUVASTATIN CALCIUM 20 MG PO TABS: 20.0000 mg | ORAL_TABLET | Freq: Every day | ORAL | 2 refills | Status: DC

## 2021-09-11 ENCOUNTER — Telehealth: Payer: Self-pay | Admitting: Internal Medicine

## 2021-09-11 NOTE — Telephone Encounter (Signed)
Left message for patient to call me back at [phone] to schedule Medicare Annual Wellness Visit   Last AWV  06/07/20  Please schedule at anytime with [facility] if patient calls the office back.    40 Minutes appointment   Any questions, please call me at [phone]

## 2021-09-30 NOTE — Progress Notes (Signed)
Cardiology Office Note    Date:  10/02/2021   ID:  DEVAL MROCZKA, DOB 28-Sep-1941, MRN [id number]   PCP:  Hoyt Koch, MD   [facility]  Cardiologist:  [name], MD (Inactive)   Advanced Practice Provider:  No care team member to display Electrophysiologist:  None   [id number]   Chief Complaint  Patient presents with   Follow-up    History of Present Illness:  [name] [name] is a 80 y.o. male with history of CAD s/p CABGx5V (2004), small AAA followed by Dr. Donnetta Hutching, carotid artery disease, tobacco abuse, thrombocytopenia/MGUS, HTN, HLD, RBBB/LPFB, hyperglycemia    AAA duplex was in February 2021 that showed The largest aortic diameter remains essentially unchanged  compared to prior exam.      Carotid duplex in 10/2014 showed 1-39% stenosis, f/u recommended 2 years. Last echocardiogram in 2004 showed EF 55-60%, mild LVH, abnormal relaxation. Nuclear stress test in 2011 showed no ischemia.   Patient last saw Dr. Meda Coffee 02/05/2021 and blood pressure been elevated.  He did not tolerate this with significant dizziness and she stopped amlodipine and start metoprolol 12.5 mg twice daily.   Patient comes in for f/u. AAA ultrasound today 5.6 cm increased from 5.1. Followed by Dr. Donzetta Matters  and seen by PA this am  and for f/u CTA abdomen. Patient woke up with anxiety attack- and BP 189/111 today is fine. Has happened 2-3 times. Denies chest pain. Has chronic DOE unchanged. Smokes 1/2 ppd. Low risk NST 2011 for arm pain.Has chronic back pain and walks with a walker. No regular exercise. Used to swim but not since covid. Denies chest pain, arm pain, dizziness or presyncope. Trouble with constipation and seeing GI soon.    Past Medical History:  Diagnosis Date   AAA (abdominal aortic aneurysm)    Back pain    decompression belt bought off of TV   CAD (coronary artery disease)    Carotid artery disease ([facility])    a. duplex 2015 - 1-39% bilaterally.    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 Ralene Ok    Past Surgical History:  Procedure Laterality Date   BACK SURGERY     COLONOSCOPY     COLONOSCOPY WITH PROPOFOL N/A 07/13/2017   Procedure: COLONOSCOPY WITH PROPOFOL;  Surgeon: Manus Gunning, MD;  Location: [facility];  Service: Gastroenterology;  Laterality: N/A;   CORONARY ARTERY BYPASS GRAFT  2004   CYSTOSCOPY WITH INSERTION OF UROLIFT N/A 12/14/2018   Procedure: CYSTOSCOPY WITH INSERTION OF UROLIFT;  Surgeon: Franchot Gallo, MD;  Location: [facility];  Service: Urology;  Laterality: N/A;   CYSTOSCOPY WITH INSERTION OF UROLIFT N/A 09/04/2020   Procedure: CYSTOSCOPY WITH INSERTION OF UROLIFT;  Surgeon: Franchot Gallo, MD;  Location: [facility];  Service: Urology;  Laterality: N/A;   DECOMPRESSIVE LUMBAR LAMINECTOMY LEVEL 1 N/A 09/04/2016   Procedure: DECOMPRESSION L3 - L4;  Surgeon: Melina Schools, MD;  Location: [facility];  Service: Orthopedics;  Laterality: N/A;   EYE SURGERY  2013   bilateral cataract extraction w/ IOL (Dr. Celene Squibb), retina surgery   JOINT REPLACEMENT     LUMBAR LAMINECTOMY/DECOMPRESSION MICRODISCECTOMY  09/04/2016   L3-4   SPINAL CORD STIMULATOR INSERTION N/A 05/13/2019   Procedure: LUMBAR SPINAL CORD STIMULATOR INSERTION;  Surgeon: Clydell Hakim, MD;  Location: [facility];  Service: Neurosurgery;  Laterality: N/A;  LUMBAR SPINAL CORD STIMULATOR INSERTION   TOTAL HIP ARTHROPLASTY Right 10/2003    Current Medications: Current Meds  Medication Sig   aspirin 81 MG  tablet Take 1 tablet (81 mg total) by mouth daily.   betamethasone valerate ointment (VALISONE) 0.1 % Apply 1 application topically 2 (two) times daily.   carvedilol (COREG) 12.5 MG tablet Take 1 tablet (12.5 mg total) by mouth 2 (two) times daily with a meal.   docusate sodium (COLACE) 50 MG capsule Take 50 mg by mouth at bedtime.    esomeprazole (NEXIUM) 20 MG capsule Take 20 mg by mouth daily at 12 noon.   ipratropium (ATROVENT) 0.03 % nasal spray SPRAY 2 SPRAYS INTO EACH NOSTRIL EVERY 12 HOURS   lisinopril (ZESTRIL) 40 MG tablet Take 1 tablet (40 mg total) by mouth daily.   montelukast (SINGULAIR) 10 MG tablet TAKE 1 TABLET BY MOUTH EVERYDAY AT BEDTIME   Multiple Vitamins-Minerals (CENTRUM SILVER ULTRA MENS PO) Take 1 tablet by mouth daily.   nitroGLYCERIN (NITROSTAT) 0.4 MG SL tablet Place 1 tablet (0.4 mg total) under the tongue every 5 (five) minutes as needed for chest pain.   rosuvastatin (CRESTOR) 20 MG tablet Take 1 tablet (20 mg total) by mouth daily.   tamsulosin (FLOMAX) 0.4 MG CAPS capsule Take 2 capsules (0.8 mg total) by mouth daily.   traMADol (ULTRAM) 50 MG tablet Take 50 mg by mouth every 4 (four) hours as needed for moderate pain.      Allergies:   Penicillins   Social History   Socioeconomic History   Marital status: Married    Spouse name: Not on file   Number of children: 2   Years of education: 12   Highest education level: Not on file  Occupational History   Occupation: trucking     Employer: RETIRED  Tobacco Use   Smoking status: Heavy Smoker    Packs/day: 0.50    Years: 65.00    Pack years: 32.50    Types: Cigarettes   Smokeless tobacco: Never   Tobacco comments:    smoking since age 33 yrs old  Vaping Use   Vaping Use: Never used  Substance and Sexual Activity   Alcohol use: Not Currently   Drug use: No   Sexual activity: Not Currently  Other Topics Concern   Not on file  Social History Narrative   HSG, Married '62, 1 son-'70, 1 dtr - '68; 2  grandchildren. Work - Probation officer.   ACP - DNR, DNI, no artificial hydration or feeding, no heroic measures   Social Determinants of Health   Financial Resource Strain: Not on file  Food Insecurity: Not on file  Transportation Needs: Not on file  Physical Activity: Not on file  Stress: Not on file  Social Connections: Not on file     Family History:  The patient's  family history includes Arthritis in his brother; COPD in his mother; Cancer in his paternal grandfather; Heart disease in his brother and brother; Kidney disease in his mother; Stroke in  his father.   ROS:   Please see the history of present illness.    ROS All other systems reviewed and are negative.   PHYSICAL EXAM:   VS:  BP 136/80   Pulse 97   Ht 5\' 10"  (1.778 m)   Wt 167 lb (75.8 kg)   SpO2 97%   BMI 23.96 kg/m   Physical Exam  GEN: Thin, in no acute distress  Neck: no JVD, carotid bruits, or masses Cardiac:RRR; no murmurs, rubs, or gallops  Respiratory:  Decreased breath sounds but clear to auscultation bilaterally, normal work of breathing GI: soft, nontender, nondistended, + BS Ext: without cyanosis, clubbing, or edema, Good distal pulses bilaterally Neuro:  Alert and Oriented x 3 Psych: euthymic mood, full affect  Wt Readings from Last 3 Encounters:  10/02/21 167 lb (75.8 kg)  10/02/21 167 lb 11.2 oz (76.1 kg)  06/21/21 168 lb (76.2 kg)      Studies/Labs Reviewed:   EKG:  EKG is  ordered today.  The ekg ordered today demonstrates NSR with first degree AV block RBBB  Recent Labs: 05/29/2021: Hemoglobin 13.7; Platelet Count 196   Lipid Panel    Component Value Date/Time   CHOL 131 11/09/2019 1029   TRIG 49 11/09/2019 1029   HDL 47 11/09/2019 1029   CHOLHDL 2.8 11/09/2019 1029   CHOLHDL 3 01/31/2016 0847   VLDL 15.4 01/31/2016 0847   LDLCALC 73 11/09/2019 1029    Additional studies/ records that were reviewed today include:    Abdomina US 10/02/21 Summary:  Abdominal  Aorta: There is evidence of abnormal dilatation of the mid  Abdominal aorta. The largest aortic measurement is 5.6 cm. The largest  aortic diameter has increased compared to prior exam. Previous diameter  measurement was 5.1 cm obtained on  04/08/2021.    *See table(s) above for measurements and observations.    Electronically signed by Servando Snare MD on 10/02/2021 at 8:50:44 AM.       Risk Assessment/Calculations:         ASSESSMENT:    1. Coronary artery disease involving native coronary artery of native heart without angina pectoris   2. Abdominal aortic aneurysm (AAA) without rupture, unspecified part   3. Essential hypertension   4. Hyperlipidemia, unspecified hyperlipidemia type   5. TOBACCO ABUSE      PLAN:  In order of problems listed above:  CAD status post CABG times 04/2003, low risk NST 2011. No chest pain or arm pain but chronic DOE unchanged. Walks with a walker and chronic back pain so not mobile.AAA has grown and he may need repair. Could consider NST if he needs surgery  AAA 5.6 on ultrasound today followed by Dr. Stanford Breed  Hypertension-controlled here and at other appt. Has had some high readings at home but he thinks he needs a new machine  Hyperlipidemia- LDL 73 11/2019 checked by PCP  Tobacco abuse-still smoking 1/2 ppd and says he has tried everything to try and quit.  Carotid stenosis 1-39% 2015 on statin  Shared Decision Making/Informed Consent        Medication Adjustments/Labs and Tests Ordered: Current medicines are reviewed at length with the patient today.  Concerns regarding medicines are outlined above.  Medication changes, Labs and Tests ordered today are listed in the Patient Instructions below. Patient Instructions  Medication Instructions:  Your physician recommends that you continue on your current medications as directed. Please refer to the Current Medication list given to you today.  *If you need  a refill on your cardiac  medications before your next appointment, please call your pharmacy*   Lab Work: None ordered   If you have labs (blood work) drawn today and your tests are completely normal, you will receive your results only by: [facility] (if you have MyChart) OR A paper copy in the mail If you have any lab test that is abnormal or we need to change your treatment, we will call you to review the results.   Testing/Procedures: None ordered    Follow-Up: Follow up with Dr. Lenna Sciara in 3 months    Other Instructions Abdominal Aortic Aneurysm An aneurysm is a bulge in a blood vessel that carries blood away from the heart (artery). It happens when blood pushes against a weak or damaged place on the wall of the blood vessel. An abdominal aortic aneurysm happens in the main blood vessel that carries blood away from the heart (aorta). Most aneurysms do not cause problems, but some do cause problems. If an aneurysm grows, it can burst or tear. This causes bleeding inside you. It is an emergency. It can be life-threatening. What are the causes? The exact cause of this condition is not known. What increases the risk? The following factors may make you more likely to develop this condition: Being male and 80 years of age or older. Being of [place] descent. Using nicotine or tobacco now or in the past. Having a family history of aneurysms. Having any of these problems: Hardening of blood vessels that carry blood away from the heart. Irritation and swelling of the walls of blood vessels that carry blood away from the heart. Certain genetic problems. Being very overweight. An infection in the wall of your aorta. High cholesterol. High blood pressure (hypertension). What are the signs or symptoms? Symptoms depend on the size of your aneurysm and how fast it is growing. Most aneurysms grow slowly and do not cause symptoms. If symptoms happen, you may: Have very bad pain in your belly  (abdomen), side, or low back. Feel full after eating only a little food. Feel a throbbing lump in your belly. Have these problems with your feet or toes: Pain. Skin turning blue. Sores. Have trouble pooping (constipation). Have trouble peeing (urinating). If your aneurysm bursts, you may: Feel sudden, very bad pain in the belly, side, or back. Feel like you may vomit. Vomit. Feel light-headed. Faint. How is this treated? Treatment for this condition depends on: The size of your aneurysm. How fast it is growing. Your age. Your risk of having the aneurysm burst. If your aneurysm is smaller than 2 inches (5 cm), your doctor may: Check it often to see if it is growing. You may have an imaging test (ultrasound) to check it every 3-6 months, every year, or every few years. Give you medicines for: High blood pressure. Pain. Infection. If your aneurysm is larger than 2 inches (5 cm), you may need surgery to fix it. Follow these instructions at home: Eating and drinking  Eat a heart-healthy diet. Eat a lot of: Fresh fruits and vegetables. Whole grains. Low-fat (lean) protein. Low-fat dairy products. Avoid foods that are high in saturated fat and cholesterol. These foods include red meat and some dairy products. Lifestyle   Do not use any products that contain nicotine or tobacco, such as cigarettes, e-cigarettes, and chewing tobacco. If you need help quitting, ask your doctor. Stay active and get exercise. Ask your doctor how often to exercise and what types of  exercise are safe for you. Keep a healthy weight. Alcohol use Do not drink alcohol if: Your doctor tells you not to drink. You are pregnant, may be pregnant, or are planning to become pregnant. If you drink alcohol: Limit how much you use to: 0-1 drink a day for women. 0-2 drinks a day for men. Be aware of how much alcohol is in your drink. In the [place] one drink equals one 12 oz bottle of beer (355 mL), one 5 oz glass  of wine (148 mL), or one 1 oz glass of hard liquor (44 mL). General instructions Take over-the-counter and prescription medicines only as told by your doctor. Keep your blood pressure in a normal range. Check it at regular times. Ask your doctor what level it should be. Have regular checks of your levels of blood sugar (glucose) and cholesterol. Follow steps to keep these levels near normal. Avoid heavy lifting and activities that take a lot of effort. Ask what activities are safe for you. If you can, learn your family's health history. Keep all follow-up visits as told by your doctor. This is important. Contact a doctor if: Your belly, side, or back hurts. Your belly throbs. You have a fever. Get help right away if: You have sudden, bad pain in your belly, side, or back. You feel like you may vomit or you vomit. You feel light-headed or you faint. Your heart beats fast when you stand. You have sweaty skin that is cold to the touch (clammy). You are short of breath. You have trouble pooping. You have trouble peeing. These symptoms may be an emergency. Do not wait to see if the symptoms will go away. Get medical help right away. Call your local emergency services (911 in the [place] Do not drive yourself to the hospital. Summary An aneurysm is a bulge in one of the blood vessels that carry blood away from the heart (artery). An abdominal aortic aneurysm happens in the main blood vessel that carries blood away from the heart (aorta). This condition can cause bleeding inside the body. It can be life-threatening. Risk can rise if you are male, age 7 or older, and of [place] descent. Risk can also rise from nicotine or tobacco use or having aneurysms in the family. Get help right away if you have symptoms of a burst aneurysm. This information is not intended to replace advice given to you by your health care provider. Make sure you discuss any questions you have with your health care  provider. Document Revised: 09/09/2019 Document Reviewed: 09/09/2019 Elsevier Patient Education  2022 [place], Ermalinda Barrios, [place]  10/02/2021 2:47 PM    [facility] [place], [place], [place]  [address] Phone: [phone]; Fax: [phone]

## 2021-10-02 ENCOUNTER — Other Ambulatory Visit: Payer: Self-pay

## 2021-10-02 ENCOUNTER — Ambulatory Visit: Payer: PPO | Admitting: Physician Assistant

## 2021-10-02 ENCOUNTER — Encounter: Payer: Self-pay | Admitting: Physician Assistant

## 2021-10-02 ENCOUNTER — Ambulatory Visit (HOSPITAL_COMMUNITY)
Admission: RE | Admit: 2021-10-02 | Discharge: 2021-10-02 | Disposition: A | Discharge status: Home / Self Care | Payer: PPO | Source: Ambulatory Visit | Attending: Vascular Surgery | Admitting: Vascular Surgery

## 2021-10-02 VITALS — BP 125/72 | HR 79 | Temp 97.5°F | Ht 70.0 in | Wt 167.7 lb

## 2021-10-02 VITALS — BP 136/80 | HR 97 | Ht 70.0 in | Wt 167.0 lb

## 2021-10-02 DIAGNOSIS — I10 Essential (primary) hypertension: Secondary | ICD-10-CM

## 2021-10-02 DIAGNOSIS — I2510 Atherosclerotic heart disease of native coronary artery without angina pectoris: Secondary | ICD-10-CM

## 2021-10-02 DIAGNOSIS — I714 Abdominal aortic aneurysm, without rupture, unspecified: Secondary | ICD-10-CM

## 2021-10-02 DIAGNOSIS — F17200 Nicotine dependence, unspecified, uncomplicated: Secondary | ICD-10-CM | POA: Diagnosis not present

## 2021-10-02 DIAGNOSIS — E785 Hyperlipidemia, unspecified: Secondary | ICD-10-CM

## 2021-10-02 NOTE — Patient Instructions (Addendum)
Medication Instructions:  Your physician recommends that you continue on your current medications as directed. Please refer to the Current Medication list given to you today.  *If you need a refill on your cardiac medications before your next appointment, please call your pharmacy*   Lab Work: None ordered   If you have labs (blood work) drawn today and your tests are completely normal, you will receive your results only by: [facility] (if you have MyChart) OR A paper copy in the mail If you have any lab test that is abnormal or we need to change your treatment, we will call you to review the results.   Testing/Procedures: None ordered    Follow-Up: Follow up with Dr. Lenna Sciara in 3 months    Other Instructions Abdominal Aortic Aneurysm An aneurysm is a bulge in a blood vessel that carries blood away from the heart (artery). It happens when blood pushes against a weak or damaged place on the wall of the blood vessel. An abdominal aortic aneurysm happens in the main blood vessel that carries blood away from the heart (aorta). Most aneurysms do not cause problems, but some do cause problems. If an aneurysm grows, it can burst or tear. This causes bleeding inside you. It is an emergency. It can be life-threatening. What are the causes? The exact cause of this condition is not known. What increases the risk? The following factors may make you more likely to develop this condition: Being male and 8 years of age or older. Being of [place] descent. Using nicotine or tobacco now or in the past. Having a family history of aneurysms. Having any of these problems: Hardening of blood vessels that carry blood away from the heart. Irritation and swelling of the walls of blood vessels that carry blood away from the heart. Certain genetic problems. Being very overweight. An infection in the wall of your aorta. High cholesterol. High blood pressure (hypertension). What are  the signs or symptoms? Symptoms depend on the size of your aneurysm and how fast it is growing. Most aneurysms grow slowly and do not cause symptoms. If symptoms happen, you may: Have very bad pain in your belly (abdomen), side, or low back. Feel full after eating only a little food. Feel a throbbing lump in your belly. Have these problems with your feet or toes: Pain. Skin turning blue. Sores. Have trouble pooping (constipation). Have trouble peeing (urinating). If your aneurysm bursts, you may: Feel sudden, very bad pain in the belly, side, or back. Feel like you may vomit. Vomit. Feel light-headed. Faint. How is this treated? Treatment for this condition depends on: The size of your aneurysm. How fast it is growing. Your age. Your risk of having the aneurysm burst. If your aneurysm is smaller than 2 inches (5 cm), your doctor may: Check it often to see if it is growing. You may have an imaging test (ultrasound) to check it every 3-6 months, every year, or every few years. Give you medicines for: High blood pressure. Pain. Infection. If your aneurysm is larger than 2 inches (5 cm), you may need surgery to fix it. Follow these instructions at home: Eating and drinking  Eat a heart-healthy diet. Eat a lot of: Fresh fruits and vegetables. Whole grains. Low-fat (lean) protein. Low-fat dairy products. Avoid foods that are high in saturated fat and cholesterol. These foods include red meat and some dairy products. Lifestyle   Do not use any products that contain nicotine or tobacco, such as  cigarettes, e-cigarettes, and chewing tobacco. If you need help quitting, ask your doctor. Stay active and get exercise. Ask your doctor how often to exercise and what types of exercise are safe for you. Keep a healthy weight. Alcohol use Do not drink alcohol if: Your doctor tells you not to drink. You are pregnant, may be pregnant, or are planning to become pregnant. If you drink  alcohol: Limit how much you use to: 0-1 drink a day for women. 0-2 drinks a day for men. Be aware of how much alcohol is in your drink. In the [place] one drink equals one 12 oz bottle of beer (355 mL), one 5 oz glass of wine (148 mL), or one 1 oz glass of hard liquor (44 mL). General instructions Take over-the-counter and prescription medicines only as told by your doctor. Keep your blood pressure in a normal range. Check it at regular times. Ask your doctor what level it should be. Have regular checks of your levels of blood sugar (glucose) and cholesterol. Follow steps to keep these levels near normal. Avoid heavy lifting and activities that take a lot of effort. Ask what activities are safe for you. If you can, learn your family's health history. Keep all follow-up visits as told by your doctor. This is important. Contact a doctor if: Your belly, side, or back hurts. Your belly throbs. You have a fever. Get help right away if: You have sudden, bad pain in your belly, side, or back. You feel like you may vomit or you vomit. You feel light-headed or you faint. Your heart beats fast when you stand. You have sweaty skin that is cold to the touch (clammy). You are short of breath. You have trouble pooping. You have trouble peeing. These symptoms may be an emergency. Do not wait to see if the symptoms will go away. Get medical help right away. Call your local emergency services (911 in the [place] Do not drive yourself to the hospital. Summary An aneurysm is a bulge in one of the blood vessels that carry blood away from the heart (artery). An abdominal aortic aneurysm happens in the main blood vessel that carries blood away from the heart (aorta). This condition can cause bleeding inside the body. It can be life-threatening. Risk can rise if you are male, age 50 or older, and of [place] descent. Risk can also rise from nicotine or tobacco use or having aneurysms in the family. Get  help right away if you have symptoms of a burst aneurysm. This information is not intended to replace advice given to you by your health care provider. Make sure you discuss any questions you have with your health care provider. Document Revised: 09/09/2019 Document Reviewed: 09/09/2019 Elsevier Patient Education  2022 [employer].

## 2021-10-02 NOTE — Progress Notes (Signed)
Office Note     CC:  follow up Requesting Provider:  Hoyt Koch, *  HPI: [name] [name] is a 80 y.o. ([birth year]) male who presents for surveillance follow up of AAA. He has had an enlarging aneurysm on follow up over the last several years. At his appointment 6 months ago the maximum diameter was 5.0 cm. He has been without any associated symptoms. He has extensive history of multiple back surgeries. He has an implanted nerve stimulator and goes to pain management for his back. Since his last visit he reports no new back pain. He still has significant back pain daily and it is worse with rainy cold weather. He denies any abdominal pain but he continues to have issues with constipation. He takes stool softeners daily but says this does not help. He reports he was suppose to have colonoscopy last year but he never did. He says he still is eating well. Denies any nausea or vomiting. No unintentional weight loss. He does not have any lower extremity claudication or rest pain. No non healing wounds. He says overall his legs are weak but this is all because of his back. He uses rolling walker to get around.  The pt on a statin for cholesterol management.  The pt on a daily aspirin.   Other AC:  none The pt  on metoprolol  And lisinopril for hypertension.   The pt is not diabetic.  Tobacco hx:  Current smoker, 1/2 ppd             Past Medical History:  Diagnosis Date   AAA (abdominal aortic aneurysm)    Back pain    decompression belt bought off of TV   CAD (coronary artery disease)    Carotid artery disease ([place])    a. duplex 2015 - 1-39% bilaterally.   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 Ralene Ok    Past Surgical History:  Procedure Laterality Date   BACK SURGERY     COLONOSCOPY     COLONOSCOPY WITH PROPOFOL N/A 07/13/2017   Procedure: COLONOSCOPY WITH PROPOFOL;  Surgeon: Manus Gunning, MD;  Location: [facility];  Service: Gastroenterology;  Laterality: N/A;   CORONARY ARTERY BYPASS GRAFT  2004   CYSTOSCOPY WITH INSERTION OF UROLIFT N/A 12/14/2018   Procedure: CYSTOSCOPY WITH INSERTION OF UROLIFT;  Surgeon: Franchot Gallo, MD;  Location: [facility];  Service: Urology;  Laterality: N/A;   CYSTOSCOPY WITH INSERTION OF UROLIFT N/A 09/04/2020   Procedure: CYSTOSCOPY WITH INSERTION OF UROLIFT;  Surgeon: Franchot Gallo, MD;  Location: [facility];  Service: Urology;  Laterality: N/A;   DECOMPRESSIVE LUMBAR LAMINECTOMY LEVEL 1 N/A 09/04/2016   Procedure: DECOMPRESSION L3 - L4;  Surgeon: Melina Schools, MD;  Location: [facility];  Service: Orthopedics;  Laterality: N/A;   EYE SURGERY  2013   bilateral cataract extraction w/ IOL (Dr. Celene Squibb), retina surgery   JOINT REPLACEMENT  LUMBAR LAMINECTOMY/DECOMPRESSION MICRODISCECTOMY  09/04/2016   L3-4   SPINAL CORD STIMULATOR INSERTION N/A 05/13/2019   Procedure: LUMBAR SPINAL CORD STIMULATOR INSERTION;  Surgeon: Clydell Hakim, MD;  Location: [place];  Service: Neurosurgery;  Laterality: N/A;  LUMBAR SPINAL CORD STIMULATOR INSERTION   TOTAL HIP ARTHROPLASTY Right 10/2003    Social History   Socioeconomic History   Marital status: Married    Spouse name: Not on file   Number of children: 2   Years of education: 12   Highest education level: Not on file  Occupational History   Occupation: trucking     Employer: RETIRED  Tobacco Use   Smoking status: Heavy Smoker    Packs/day: 0.50    Years: 65.00    Pack years: 32.50    Types: Cigarettes   Smokeless tobacco:  Never   Tobacco comments:    smoking since age 79 yrs old  Vaping Use   Vaping Use: Never used  Substance and Sexual Activity   Alcohol use: Not Currently   Drug use: No   Sexual activity: Not Currently  Other Topics Concern   Not on file  Social History Narrative   HSG, Married '62, 1 son-'70, 1 dtr - '68; 2 grandchildren. Work - Probation officer.   ACP - DNR, DNI, no artificial hydration or feeding, no heroic measures   Social Determinants of Health   Financial Resource Strain: Not on file  Food Insecurity: Not on file  Transportation Needs: Not on file  Physical Activity: Not on file  Stress: Not on file  Social Connections: Not on file  Intimate Partner Violence: Not on file    Family History  Problem Relation Age of Onset   Kidney disease Mother    COPD Mother    Stroke Father        cerebral hemorrhage   Heart disease Brother    Heart disease Brother    Arthritis Brother    Cancer Paternal Grandfather     Current Outpatient Medications  Medication Sig Dispense Refill   aspirin 81 MG tablet Take 1 tablet (81 mg total) by mouth daily. 90 tablet 3   betamethasone valerate ointment (VALISONE) 0.1 % Apply 1 application topically 2 (two) times daily. 452 g 2   carvedilol (COREG) 12.5 MG tablet Take 1 tablet (12.5 mg total) by mouth 2 (two) times daily with a meal. 180 tablet 1   docusate sodium (COLACE) 50 MG capsule Take 50 mg by mouth at bedtime.      esomeprazole (NEXIUM) 20 MG capsule Take 20 mg by mouth daily at 12 noon.     ipratropium (ATROVENT) 0.03 % nasal spray SPRAY 2 SPRAYS INTO EACH NOSTRIL EVERY 12 HOURS 30 mL 4   lisinopril (ZESTRIL) 40 MG tablet Take 1 tablet (40 mg total) by mouth daily. 90 tablet 2   montelukast (SINGULAIR) 10 MG tablet TAKE 1 TABLET BY MOUTH EVERYDAY AT BEDTIME 90 tablet 0   Multiple Vitamins-Minerals (CENTRUM SILVER ULTRA MENS PO) Take 1 tablet by mouth daily.     nitroGLYCERIN (NITROSTAT) 0.4 MG SL tablet Place 1 tablet  (0.4 mg total) under the tongue every 5 (five) minutes as needed for chest pain. 25 tablet 3   rosuvastatin (CRESTOR) 20 MG tablet Take 1 tablet (20 mg total) by mouth daily. 90 tablet 2   tamsulosin (FLOMAX) 0.4 MG CAPS capsule Take 2 capsules (0.8 mg total) by mouth daily. 180 capsule 3   traMADol (ULTRAM) 50 MG tablet Take 50  mg by mouth every 4 (four) hours as needed for moderate pain.      No current facility-administered medications for this visit.    Allergies  Allergen Reactions   Penicillins Other (See Comments)    PATIENT HAS HAD A PCN REACTION WITH IMMEDIATE RASH, FACIAL/TONGUE/THROAT SWELLING, SOB, OR LIGHTHEADEDNESS WITH HYPOTENSION:   #  YES  #    Has patient had a PCN reaction causing severe rash involving mucus membranes or skin necrosis: No Has patient had a PCN reaction that required hospitalization No Has patient had a PCN reaction occurring within the last 10 years: No If all of the above answers are "NO", then may proceed with Cephalosporin use.  Passed out     REVIEW OF SYSTEMS:   [X]  denotes positive finding, [ ]  denotes negative finding Cardiac  Comments:  Chest pain or chest pressure:    Shortness of breath upon exertion:    Short of breath when lying flat:    Irregular heart rhythm:        Vascular    Pain in calf, thigh, or hip brought on by ambulation:    Pain in feet at night that wakes you up from your sleep:     Blood clot in your veins:    Leg swelling:         Pulmonary    Oxygen at home:    Productive cough:     Wheezing:         Neurologic    Sudden weakness in arms or legs:     Sudden numbness in arms or legs:     Sudden onset of difficulty speaking or slurred speech:    Temporary loss of vision in one eye:     Problems with dizziness:         Gastrointestinal    Blood in stool:     Vomited blood:         Genitourinary    Burning when urinating:     Blood in urine:        Psychiatric    Major depression:          Hematologic    Bleeding problems:    Problems with blood clotting too easily:        Skin    Rashes or ulcers:        Constitutional    Fever or chills:      PHYSICAL EXAMINATION:  Vitals:   10/02/21 0836  BP: 125/72  Pulse: 79  Temp: (!) 97.5 F (36.4 C)  TempSrc: Skin  SpO2: 99%  Weight: 167 lb 11.2 oz (76.1 kg)  Height: 5\' 10"  (1.778 m)    General:  WDWN in NAD; vital signs documented above Gait: shuffling HENT: WNL, normocephalic Pulmonary: normal non-labored breathing wheezing Cardiac: regular HR, without  Murmurs without carotid bruit Abdomen: soft, NT, no masses. Palpable Abdominal aortic pulse Skin: without rashes Vascular Exam/Pulses:  Right Left  Radial 2+ (normal) 2+ (normal)  Femoral 2+ (normal) 2+ (normal)  Popliteal 2+ (normal) 2+ (normal)  DP 1+ (weak) 2+ (normal)  PT 1+ (weak) 1+ (weak)   Extremities: without ischemic changes, without Gangrene , without cellulitis; without open wounds;  Musculoskeletal: no muscle wasting or atrophy  Neurologic: A&O X 3;  No focal weakness or paresthesias are detected Psychiatric:  The pt has Normal affect.   Non-Invasive Vascular Imaging:   Abdominal Aorta Findings:  +-----------+-------+----------+----------+--------+--------+--------+  Location   AP (cm)Trans (cm)PSV (cm/s)WaveformThrombusComments  +-----------+-------+----------+----------+--------+--------+--------+  Proximal                    93                Present           +-----------+-------+----------+----------+--------+--------+--------+  Mid        5.29   5.57      97                Present           +-----------+-------+----------+----------+--------+--------+--------+  Distal     3.49   3.17      32                                  +-----------+-------+----------+----------+--------+--------+--------+  RT CIA Prox2.2    2.2       36                                   +-----------+-------+----------+----------+--------+--------+--------+  LT CIA Prox1.5    1.4       103                                 +-----------+-------+----------+----------+--------+--------+--------+   Visualization of the Proximal Abdominal Aorta was limited.      Summary:  Abdominal Aorta: There is evidence of abnormal dilatation of the mid Abdominal aorta. The largest aortic measurement is 5.6 cm. The largest aortic diameter has increased compared to prior exam. Previous diameter measurement was 5.1 cm obtained on 04/08/2021.    ASSESSMENT/PLAN:: 80 y.o. male here for follow up for AAA. Previously aneurysm was 5.1 cm. On duplex today it is now 5.6 cm on max diameter. He is not having any symptoms associated with AAA. He has follow up with his PCP for his constipation issues. He also has appointment later today with his Cardiologist for issues with his blood pressure medications. I have encouraged him to keep these appointments - He understands should he have any severe abdominal or back pain to call 911 or go to the ER - Will arrange for him to have CTA abdomen and pelvis and follow up with Dr. Stanford Breed in the near future to review CT and discuss possible options if repair is indicated   Karoline Caldwell, PA-C [facility] [phone]  Clinic MD:   Yetta Barre

## 2021-10-10 ENCOUNTER — Ambulatory Visit (HOSPITAL_COMMUNITY)
Admission: RE | Admit: 2021-10-10 | Discharge: 2021-10-10 | Disposition: A | Discharge status: Home / Self Care | Payer: PPO | Source: Ambulatory Visit | Attending: Vascular Surgery | Admitting: Vascular Surgery

## 2021-10-10 ENCOUNTER — Other Ambulatory Visit: Payer: Self-pay

## 2021-10-10 DIAGNOSIS — I714 Abdominal aortic aneurysm, without rupture, unspecified: Secondary | ICD-10-CM | POA: Insufficient documentation

## 2021-10-10 DIAGNOSIS — I7143 Infrarenal abdominal aortic aneurysm, without rupture: Secondary | ICD-10-CM | POA: Diagnosis not present

## 2021-10-10 DIAGNOSIS — N281 Cyst of kidney, acquired: Secondary | ICD-10-CM | POA: Diagnosis not present

## 2021-10-10 LAB — POCT I-STAT CREATININE: Creatinine, Ser: 0.7 mg/dL (ref 0.61–1.24)

## 2021-10-10 MED ORDER — IOHEXOL 350 MG/ML SOLN
80.0000 mL | Freq: Once | INTRAVENOUS | Status: AC | PRN
  Administered 2021-10-10: 80 mL via INTRAVENOUS

## 2021-10-14 NOTE — H&P (View-Only) (Signed)
[facility] [place]  ASSESSMENT / PLAN: [name] [name] is a 80 y.o. male with a infrarenal abdominal aortic aneurysm measuring 59mm in greatest orthogonal dimension.  A statement from the [place] for Vascular [employer] and Society for Vascular Surgery estimated the annual rupture risk according to AAA diameter to be the following: 5.0 cm to 5.9 cm in diameter - 3% to 15%  The patient is a candidate for elective repair of the aneurysm to prevent rupture.  I explained the risks / benefits / alternatives to different approaches to aortic reconstruction.   I explained the specific benefits of open repair including improved durability, limited requirement for surveillance, less need for secondary intervention. I explained the specific risks from open repair including higher physiologic stress from aortic cross clamping, higher risk of perioperative complication (including stroke, MI, pneumonia, renal insufficiency and failure, etc.), higher risk of abdominal wall complications, risk of anastomotic pseudoaneurysm.  I explained the specific benefits of endovascular repair including limited physiologic stress and less recovery time, lower risk of serious perioperative complication, zero risk of abdominal wall complication.  I explained the specific risks from endovascular repair including need for lifetime surveillance, risk of large-bore arterial access, risk of requiring secondary intervention to maintain seal or patency. I explained that not all patients are candidates for endovascular repair based on their unique anatomy.  After detailed discussion the patient and I agree that the best option for the patient is EVAR.   Recommend the following to reduce the risk of major adverse cardiac / limb events.  Complete cessation from all tobacco products. Excellent blood glucose control with goal A1c < 7%. Blood pressure control with goal blood  pressure < 140/90 mmHg. Excellent lipid reduction therapy with goal LDL-C <100 mg/dL. Aspirin 81mg  PO QD.  Atorvastatin 40-80mg  PO QD (or other "high intensity" statin therapy).  Plan EVAR 10/23/21.   CHIEF COMPLAINT: follow up AAA  HISTORY OF PRESENT ILLNESS: [name] [name] is a 80 y.o. male previously seen by Dr. Donnetta Hutching and the [facility] PA's for surveillance of abdominal aortic aneurysm.  The patient returns to my clinic because he has reached a size threshold for intervention.  A CT angiogram was ordered and personally reviewed in detail.  See below.  The patient has no symptoms referable to his aneurysm.  We spent the majority of our visit discussing the rationale for intervention, as well as the options for treatment.  VASCULAR SURGICAL HISTORY: none  VASCULAR RISK FACTORS: Negative history of stroke / transient ischemic attack. Positive history of coronary artery disease.  Negative history of diabetes mellitus.  Positive history of smoking. + actively smoking. Positive history of hypertension. Negative history of chronic kidney disease.  Last GFR >60.  Negative history of chronic obstructive pulmonary disease.  FUNCTIONAL STATUS: ECOG performance status: (1) Restricted in physically strenuous activity, ambulatory and able to do work of light nature Ambulatory status: Ambulatory within the community with limits  Past Medical History:  Diagnosis Date   AAA (abdominal aortic aneurysm)    Back pain    decompression belt bought off of TV   CAD (coronary artery disease)    Carotid artery disease ([place])    a. duplex 2015 - 1-39% bilaterally.   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 Ralene Ok    Past Surgical History:  Procedure Laterality Date   BACK SURGERY     COLONOSCOPY     COLONOSCOPY WITH PROPOFOL N/A 07/13/2017   Procedure: COLONOSCOPY WITH PROPOFOL;  Surgeon: Manus Gunning, MD;  Location: [facility];  Service: Gastroenterology;  Laterality: N/A;   CORONARY ARTERY BYPASS GRAFT  2004   CYSTOSCOPY WITH INSERTION OF UROLIFT N/A 12/14/2018   Procedure: CYSTOSCOPY WITH INSERTION OF UROLIFT;  Surgeon: Franchot Gallo, MD;  Location: [facility];  Service: Urology;  Laterality: N/A;   CYSTOSCOPY WITH INSERTION OF UROLIFT N/A 09/04/2020   Procedure: CYSTOSCOPY WITH INSERTION OF UROLIFT;  Surgeon: Franchot Gallo, MD;  Location: [facility];  Service: Urology;  Laterality: N/A;   DECOMPRESSIVE LUMBAR LAMINECTOMY LEVEL 1 N/A 09/04/2016   Procedure: DECOMPRESSION L3 - L4;  Surgeon: Melina Schools, MD;  Location: [facility];  Service: Orthopedics;  Laterality: N/A;   EYE SURGERY  2013   bilateral cataract extraction w/ IOL (Dr. Celene Squibb), retina surgery   JOINT REPLACEMENT     LUMBAR LAMINECTOMY/DECOMPRESSION MICRODISCECTOMY  09/04/2016   L3-4   SPINAL CORD STIMULATOR INSERTION N/A 05/13/2019   Procedure: LUMBAR SPINAL CORD STIMULATOR INSERTION;  Surgeon: Clydell Hakim, MD;  Location: [facility];  Service: Neurosurgery;  Laterality: N/A;  LUMBAR SPINAL CORD STIMULATOR INSERTION   TOTAL HIP ARTHROPLASTY Right 10/2003    Family History  Problem Relation Age of Onset   Kidney disease Mother    COPD Mother    Stroke Father        cerebral hemorrhage   Heart disease Brother    Heart disease Brother    Arthritis Brother    Cancer Paternal Grandfather     Social History   Socioeconomic History   Marital status: Married    Spouse name: Not on file   Number of children: 2   Years of education: 12   Highest education  level: Not on file  Occupational History   Occupation: trucking     Employer: RETIRED  Tobacco Use   Smoking status: Heavy Smoker    Packs/day: 0.50    Years: 65.00    Pack years: 32.50    Types: Cigarettes   Smokeless tobacco: Never   Tobacco comments:    smoking since age 64 yrs old  Vaping Use   Vaping Use: Never used  Substance and Sexual Activity   Alcohol use: Not Currently   Drug use: No   Sexual activity: Not Currently  Other Topics Concern   Not on file  Social History Narrative   HSG, Married '62, 1 son-'70, 1 dtr - '68; 2 grandchildren. Work - Probation officer.   ACP - DNR, DNI, no artificial hydration or feeding, no heroic measures   Social Determinants of Health   Financial Resource Strain: Not on file  Food Insecurity: Not on file  Transportation Needs: Not on file  Physical Activity: Not on file  Stress: Not on file  Social Connections: Not on file  Intimate Partner Violence: Not on file    Allergies  Allergen Reactions   Penicillins Other (See Comments)  PATIENT HAS HAD A PCN REACTION WITH IMMEDIATE RASH, FACIAL/TONGUE/THROAT SWELLING, SOB, OR LIGHTHEADEDNESS WITH HYPOTENSION:   #  YES  #    Has patient had a PCN reaction causing severe rash involving mucus membranes or skin necrosis: No Has patient had a PCN reaction that required hospitalization No Has patient had a PCN reaction occurring within the last 10 years: No If all of the above answers are "NO", then may proceed with Cephalosporin use.  Passed out    Current Outpatient Medications  Medication Sig Dispense Refill   aspirin 81 MG tablet Take 1 tablet (81 mg total) by mouth daily. 90 tablet 3   betamethasone valerate ointment (VALISONE) 0.1 % Apply 1 application topically 2 (two) times daily. 452 g 2   carvedilol (COREG) 12.5 MG tablet Take 1 tablet (12.5 mg total) by mouth 2 (two) times daily with a meal. 180 tablet 1   docusate sodium (COLACE) 50 MG capsule Take 50 mg by mouth at  bedtime.      esomeprazole (NEXIUM) 20 MG capsule Take 20 mg by mouth daily at 12 noon.     ipratropium (ATROVENT) 0.03 % nasal spray SPRAY 2 SPRAYS INTO EACH NOSTRIL EVERY 12 HOURS 30 mL 4   lisinopril (ZESTRIL) 40 MG tablet Take 1 tablet (40 mg total) by mouth daily. 90 tablet 2   montelukast (SINGULAIR) 10 MG tablet TAKE 1 TABLET BY MOUTH EVERYDAY AT BEDTIME 90 tablet 0   Multiple Vitamins-Minerals (CENTRUM SILVER ULTRA MENS PO) Take 1 tablet by mouth daily.     nitroGLYCERIN (NITROSTAT) 0.4 MG SL tablet Place 1 tablet (0.4 mg total) under the tongue every 5 (five) minutes as needed for chest pain. 25 tablet 3   rosuvastatin (CRESTOR) 20 MG tablet Take 1 tablet (20 mg total) by mouth daily. 90 tablet 2   tamsulosin (FLOMAX) 0.4 MG CAPS capsule Take 2 capsules (0.8 mg total) by mouth daily. 180 capsule 3   traMADol (ULTRAM) 50 MG tablet Take 50 mg by mouth every 4 (four) hours as needed for moderate pain.      No current facility-administered medications for this visit.    REVIEW OF SYSTEMS:  [X]  denotes positive finding, [ ]  denotes negative finding Cardiac  Comments:  Chest pain or chest pressure:    Shortness of breath upon exertion:    Short of breath when lying flat:    Irregular heart rhythm:        Vascular    Pain in calf, thigh, or hip brought on by ambulation:    Pain in feet at night that wakes you up from your sleep:     Blood clot in your veins:    Leg swelling:         Pulmonary    Oxygen at home:    Productive cough:     Wheezing:         Neurologic    Sudden weakness in arms or legs:     Sudden numbness in arms or legs:     Sudden onset of difficulty speaking or slurred speech:    Temporary loss of vision in one eye:     Problems with dizziness:         Gastrointestinal    Blood in stool:     Vomited blood:         Genitourinary    Burning when urinating:     Blood in urine:        Psychiatric  Major depression:         Hematologic    Bleeding  problems:    Problems with blood clotting too easily:        Skin    Rashes or ulcers:        Constitutional    Fever or chills:      PHYSICAL EXAM Vitals:   10/15/21 0900  BP: (!) 158/94  Pulse: 75  Resp: 20  Temp: 98.3 F (36.8 C)  SpO2: 99%  Weight: 168 lb (76.2 kg)  Height: 5\' 10"  (1.778 m)    Constitutional: Elderly appearing. No distress. Appears well nourished.  Neurologic: CN intact. no focal findings. no sensory loss. Psychiatric:  Mood and affect symmetric and appropriate. Eyes:  No icterus. No conjunctival pallor. Ears, nose, throat:  mucous membranes moist. Midline trachea.  Cardiac: regular rate and rhythm.  Respiratory:  unlabored. Abdominal: not distended.  Extremity: no edema. no cyanosis. no pallor.  Skin: no gangrene. no ulceration.  Lymphatic: no Stemmer's sign. no palpable lymphadenopathy.  PERTINENT LABORATORY AND RADIOLOGIC DATA  Most recent CBC CBC Latest Ref Rng & Units 05/29/2021 06/05/2020 06/06/2019  WBC 4.0 - 10.5 K/uL 8.4 9.3 9.7  Hemoglobin 13.0 - 17.0 g/dL 13.7 15.1 14.5  Hematocrit 39.0 - 52.0 % 41.1 45.7 44.1  Platelets 150 - 400 K/uL 196 199 217     Most recent CMP CMP Latest Ref Rng & Units 10/10/2021 06/05/2020 06/06/2019  Glucose 70 - 99 mg/dL - 109(H) 96  BUN 8 - 23 mg/dL - 13 11  Creatinine 0.61 - 1.24 mg/dL 0.70 0.85 0.82  Sodium 135 - 145 mmol/L - 138 140  Potassium 3.5 - 5.1 mmol/L - 4.3 4.1  Chloride 98 - 111 mmol/L - 106 107  CO2 22 - 32 mmol/L - 24 25  Calcium 8.9 - 10.3 mg/dL - 9.3 9.1  Total Protein 6.5 - 8.1 g/dL - 7.4 7.1  Total Bilirubin 0.3 - 1.2 mg/dL - 0.5 0.4  Alkaline Phos 38 - 126 U/L - 85 78  AST 15 - 41 U/L - 21 17  ALT 0 - 44 U/L - 17 16    Renal function Estimated Creatinine Clearance: 74.8 mL/min (by C-G formula based on SCr of 0.7 mg/dL).  No results found for: HGBA1C  LDL Chol Calc (NIH)  Date Value Ref Range Status  11/09/2019 73 0 - 99 mg/dL Final     CT angiogram reviewed in detail.  55  mm infrarenal deformed aneurysm.  Anatomy appears amenable to endovascular reconstruction.  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  Access has mild calcification, but appears amenable to percutaneous closure.  Yevonne Aline. Stanford Breed, MD [facility] Phone Number: [phone] 10/15/2021 11:43 AM  Total time spent on preparing this encounter including chart review, data review, collecting history, examining the patient, coordinating care for this established patient, 40 minutes.  Portions of this report may have been transcribed using voice recognition software.  Every effort has been made to ensure accuracy; however, inadvertent computerized transcription errors may still be present.

## 2021-10-14 NOTE — Progress Notes (Signed)
[facility] [place]  ASSESSMENT / PLAN: [name] [name] is a 80 y.o. 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  A statement from the [place] [employer] and Society for Vascular Surgery estimated the annual rupture risk according to AAA diameter to be the following: 5.0 cm to 5.9 cm in diameter - 3% to 15%  The patient is a candidate for elective repair of the aneurysm to prevent rupture.  I explained the risks / benefits / alternatives to different approaches to aortic reconstruction.   I explained the specific benefits of open repair including improved durability, limited requirement for surveillance, less need for secondary intervention. I explained the specific risks from open repair including higher physiologic stress from aortic cross clamping, higher risk of perioperative complication (including stroke, MI, pneumonia, renal insufficiency and failure, etc.), higher risk of abdominal wall complications, risk of anastomotic pseudoaneurysm.  I explained the specific benefits of endovascular repair including limited physiologic stress and less recovery time, lower risk of serious perioperative complication, zero risk of abdominal wall complication.  I explained the specific risks from endovascular repair including need for lifetime surveillance, risk of large-bore arterial access, risk of requiring secondary intervention to maintain seal or patency. I explained that not all patients are candidates for endovascular repair based on their unique anatomy.  After detailed discussion the patient and I agree that the best option for the patient is EVAR.   Recommend the following to reduce the risk of major adverse cardiac / limb events.  Complete cessation from all tobacco products. Excellent blood glucose control with goal A1c < 7%. Blood pressure control with goal blood  pressure < 140/90 mmHg. Excellent lipid reduction therapy with goal LDL-C <100 mg/dL. Aspirin 81mg  PO QD.  Atorvastatin 40-80mg  PO QD (or other "high intensity" statin therapy).  Plan EVAR 10/23/21.   CHIEF COMPLAINT: follow up AAA  HISTORY OF PRESENT ILLNESS: [name] [name] is a 80 y.o. male previously seen by Dr. Donnetta Hutching and the VBS PA's for surveillance of abdominal aortic aneurysm.  The patient returns to my clinic because he has reached a size threshold for intervention.  A CT angiogram was ordered and personally reviewed in detail.  See below.  The patient has no symptoms referable to his aneurysm.  We spent the majority of our visit discussing the rationale for intervention, as well as the options for treatment.  VASCULAR SURGICAL HISTORY: none  VASCULAR RISK FACTORS: Negative history of stroke / transient ischemic attack. Positive history of coronary artery disease.  Negative history of diabetes mellitus.  Positive history of smoking. + actively smoking. Positive history of hypertension. Negative history of chronic kidney disease.  Last GFR >60.  Negative history of chronic obstructive pulmonary disease.  FUNCTIONAL STATUS: ECOG performance status: (1) Restricted in physically strenuous activity, ambulatory and able to do work of light nature Ambulatory status: Ambulatory within the community with limits  Past Medical History:  Diagnosis Date   AAA (abdominal aortic aneurysm)    Back pain    decompression belt bought off of TV   CAD (coronary artery disease)    Carotid artery disease ([place])    a. duplex 2015 - 1-39% bilaterally.   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 Ralene Ok    Past Surgical History:  Procedure Laterality Date   BACK SURGERY     COLONOSCOPY     COLONOSCOPY WITH PROPOFOL N/A 07/13/2017   Procedure: COLONOSCOPY WITH PROPOFOL;  Surgeon: Manus Gunning, MD;  Location: [facility];  Service: Gastroenterology;  Laterality: N/A;   CORONARY ARTERY BYPASS GRAFT  2004   CYSTOSCOPY WITH INSERTION OF UROLIFT N/A 12/14/2018   Procedure: CYSTOSCOPY WITH INSERTION OF UROLIFT;  Surgeon: Franchot Gallo, MD;  Location: [facility];  Service: Urology;  Laterality: N/A;   CYSTOSCOPY WITH INSERTION OF UROLIFT N/A 09/04/2020   Procedure: CYSTOSCOPY WITH INSERTION OF UROLIFT;  Surgeon: Franchot Gallo, MD;  Location: [facility];  Service: Urology;  Laterality: N/A;   DECOMPRESSIVE LUMBAR LAMINECTOMY LEVEL 1 N/A 09/04/2016   Procedure: DECOMPRESSION L3 - L4;  Surgeon: Melina Schools, MD;  Location: [facility];  Service: Orthopedics;  Laterality: N/A;   EYE SURGERY  2013   bilateral cataract extraction w/ IOL (Dr. Celene Squibb), retina surgery   JOINT REPLACEMENT     LUMBAR LAMINECTOMY/DECOMPRESSION MICRODISCECTOMY  09/04/2016   L3-4   SPINAL CORD STIMULATOR INSERTION N/A 05/13/2019   Procedure: LUMBAR SPINAL CORD STIMULATOR INSERTION;  Surgeon: Clydell Hakim, MD;  Location: [facility];  Service: Neurosurgery;  Laterality: N/A;  LUMBAR SPINAL CORD STIMULATOR INSERTION   TOTAL HIP ARTHROPLASTY Right 10/2003    Family History  Problem Relation Age of Onset   Kidney disease Mother    COPD Mother    Stroke Father        cerebral hemorrhage   Heart disease Brother    Heart disease Brother    Arthritis Brother    Cancer Paternal Grandfather     Social History   Socioeconomic History   Marital status: Married    Spouse name: Not on file   Number of children: 2   Years of education: 12   Highest education  level: Not on file  Occupational History   Occupation: trucking     Employer: RETIRED  Tobacco Use   Smoking status: Heavy Smoker    Packs/day: 0.50    Years: 65.00    Pack years: 32.50    Types: Cigarettes   Smokeless tobacco: Never   Tobacco comments:    smoking since age 59 yrs old  Vaping Use   Vaping Use: Never used  Substance and Sexual Activity   Alcohol use: Not Currently   Drug use: No   Sexual activity: Not Currently  Other Topics Concern   Not on file  Social History Narrative   HSG, Married '62, 1 son-'70, 1 dtr - '68; 2 grandchildren. Work - Probation officer.   ACP - DNR, DNI, no artificial hydration or feeding, no heroic measures   Social Determinants of Health   Financial Resource Strain: Not on file  Food Insecurity: Not on file  Transportation Needs: Not on file  Physical Activity: Not on file  Stress: Not on file  Social Connections: Not on file  Intimate Partner Violence: Not on file    Allergies  Allergen Reactions   Penicillins Other (See Comments)  PATIENT HAS HAD A PCN REACTION WITH IMMEDIATE RASH, FACIAL/TONGUE/THROAT SWELLING, SOB, OR LIGHTHEADEDNESS WITH HYPOTENSION:   #  YES  #    Has patient had a PCN reaction causing severe rash involving mucus membranes or skin necrosis: No Has patient had a PCN reaction that required hospitalization No Has patient had a PCN reaction occurring within the last 10 years: No If all of the above answers are "NO", then may proceed with Cephalosporin use.  Passed out    Current Outpatient Medications  Medication Sig Dispense Refill   aspirin 81 MG tablet Take 1 tablet (81 mg total) by mouth daily. 90 tablet 3   betamethasone valerate ointment (VALISONE) 0.1 % Apply 1 application topically 2 (two) times daily. 452 g 2   carvedilol (COREG) 12.5 MG tablet Take 1 tablet (12.5 mg total) by mouth 2 (two) times daily with a meal. 180 tablet 1   docusate sodium (COLACE) 50 MG capsule Take 50 mg by mouth at  bedtime.      esomeprazole (NEXIUM) 20 MG capsule Take 20 mg by mouth daily at 12 noon.     ipratropium (ATROVENT) 0.03 % nasal spray SPRAY 2 SPRAYS INTO EACH NOSTRIL EVERY 12 HOURS 30 mL 4   lisinopril (ZESTRIL) 40 MG tablet Take 1 tablet (40 mg total) by mouth daily. 90 tablet 2   montelukast (SINGULAIR) 10 MG tablet TAKE 1 TABLET BY MOUTH EVERYDAY AT BEDTIME 90 tablet 0   Multiple Vitamins-Minerals (CENTRUM SILVER ULTRA MENS PO) Take 1 tablet by mouth daily.     nitroGLYCERIN (NITROSTAT) 0.4 MG SL tablet Place 1 tablet (0.4 mg total) under the tongue every 5 (five) minutes as needed for chest pain. 25 tablet 3   rosuvastatin (CRESTOR) 20 MG tablet Take 1 tablet (20 mg total) by mouth daily. 90 tablet 2   tamsulosin (FLOMAX) 0.4 MG CAPS capsule Take 2 capsules (0.8 mg total) by mouth daily. 180 capsule 3   traMADol (ULTRAM) 50 MG tablet Take 50 mg by mouth every 4 (four) hours as needed for moderate pain.      No current facility-administered medications for this visit.    REVIEW OF SYSTEMS:  [X]  denotes positive finding, [ ]  denotes negative finding Cardiac  Comments:  Chest pain or chest pressure:    Shortness of breath upon exertion:    Short of breath when lying flat:    Irregular heart rhythm:        Vascular    Pain in calf, thigh, or hip brought on by ambulation:    Pain in feet at night that wakes you up from your sleep:     Blood clot in your veins:    Leg swelling:         Pulmonary    Oxygen at home:    Productive cough:     Wheezing:         Neurologic    Sudden weakness in arms or legs:     Sudden numbness in arms or legs:     Sudden onset of difficulty speaking or slurred speech:    Temporary loss of vision in one eye:     Problems with dizziness:         Gastrointestinal    Blood in stool:     Vomited blood:         Genitourinary    Burning when urinating:     Blood in urine:        Psychiatric  Major depression:         Hematologic    Bleeding  problems:    Problems with blood clotting too easily:        Skin    Rashes or ulcers:        Constitutional    Fever or chills:      PHYSICAL EXAM Vitals:   10/15/21 0900  BP: (!) 158/94  Pulse: 75  Resp: 20  Temp: 98.3 F (36.8 C)  SpO2: 99%  Weight: 168 lb (76.2 kg)  Height: 5\' 10"  (1.778 m)    Constitutional: Elderly appearing. No distress. Appears well nourished.  Neurologic: CN intact. no focal findings. no sensory loss. Psychiatric:  Mood and affect symmetric and appropriate. Eyes:  No icterus. No conjunctival pallor. Ears, nose, throat:  mucous membranes moist. Midline trachea.  Cardiac: regular rate and rhythm.  Respiratory:  unlabored. Abdominal: not distended.  Extremity: no edema. no cyanosis. no pallor.  Skin: no gangrene. no ulceration.  Lymphatic: no Stemmer's sign. no palpable lymphadenopathy.  PERTINENT LABORATORY AND RADIOLOGIC DATA  Most recent CBC CBC Latest Ref Rng & Units 05/29/2021 06/05/2020 06/06/2019  WBC 4.0 - 10.5 K/uL 8.4 9.3 9.7  Hemoglobin 13.0 - 17.0 g/dL 13.7 15.1 14.5  Hematocrit 39.0 - 52.0 % 41.1 45.7 44.1  Platelets 150 - 400 K/uL 196 199 217     Most recent CMP CMP Latest Ref Rng & Units 10/10/2021 06/05/2020 06/06/2019  Glucose 70 - 99 mg/dL - 109(H) 96  BUN 8 - 23 mg/dL - 13 11  Creatinine 0.61 - 1.24 mg/dL 0.70 0.85 0.82  Sodium 135 - 145 mmol/L - 138 140  Potassium 3.5 - 5.1 mmol/L - 4.3 4.1  Chloride 98 - 111 mmol/L - 106 107  CO2 22 - 32 mmol/L - 24 25  Calcium 8.9 - 10.3 mg/dL - 9.3 9.1  Total Protein 6.5 - 8.1 g/dL - 7.4 7.1  Total Bilirubin 0.3 - 1.2 mg/dL - 0.5 0.4  Alkaline Phos 38 - 126 U/L - 85 78  AST 15 - 41 U/L - 21 17  ALT 0 - 44 U/L - 17 16    Renal function Estimated Creatinine Clearance: 74.8 mL/min (by C-G formula based on SCr of 0.7 mg/dL).  No results found for: HGBA1C  LDL Chol Calc (NIH)  Date Value Ref Range Status  11/09/2019 73 0 - 99 mg/dL Final     CT angiogram reviewed in detail.  55  mm infrarenal deformed aneurysm.  Anatomy appears amenable to endovascular reconstruction.  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  Access has mild calcification, but appears amenable to percutaneous closure.  Yevonne Aline. Stanford Breed, MD [facility] Phone Number: [phone] 10/15/2021 11:43 AM  Total time spent on preparing this encounter including chart review, data review, collecting history, examining the patient, coordinating care for this established patient, 40 minutes.  Portions of this report may have been transcribed using voice recognition software.  Every effort has been made to ensure accuracy; however, inadvertent computerized transcription errors may still be present.

## 2021-10-15 ENCOUNTER — Ambulatory Visit: Payer: PPO | Admitting: Vascular Surgery

## 2021-10-15 ENCOUNTER — Other Ambulatory Visit: Payer: Self-pay

## 2021-10-15 ENCOUNTER — Encounter: Payer: Self-pay | Admitting: Vascular Surgery

## 2021-10-15 VITALS — BP 158/94 | HR 75 | Temp 98.3°F | Resp 20 | Ht 70.0 in | Wt 168.0 lb

## 2021-10-15 DIAGNOSIS — I7143 Infrarenal abdominal aortic aneurysm, without rupture: Secondary | ICD-10-CM | POA: Diagnosis not present

## 2021-10-15 DIAGNOSIS — I714 Abdominal aortic aneurysm, without rupture, unspecified: Secondary | ICD-10-CM

## 2021-10-16 DIAGNOSIS — M961 Postlaminectomy syndrome, not elsewhere classified: Secondary | ICD-10-CM | POA: Diagnosis not present

## 2021-10-17 ENCOUNTER — Encounter: Payer: Self-pay | Admitting: Internal Medicine

## 2021-10-21 ENCOUNTER — Other Ambulatory Visit: Payer: Self-pay

## 2021-10-21 ENCOUNTER — Encounter (HOSPITAL_COMMUNITY): Payer: Self-pay

## 2021-10-21 ENCOUNTER — Encounter (HOSPITAL_COMMUNITY)
Admission: RE | Admit: 2021-10-21 | Discharge: 2021-10-21 | Disposition: A | Discharge status: Home / Self Care | Payer: PPO | Source: Ambulatory Visit | Attending: Vascular Surgery | Admitting: Vascular Surgery

## 2021-10-21 VITALS — BP 149/93 | HR 80 | Temp 98.1°F | Resp 17 | Ht 70.0 in | Wt 170.3 lb

## 2021-10-21 DIAGNOSIS — Z01812 Encounter for preprocedural laboratory examination: Secondary | ICD-10-CM | POA: Insufficient documentation

## 2021-10-21 DIAGNOSIS — Z20822 Contact with and (suspected) exposure to covid-19: Secondary | ICD-10-CM | POA: Insufficient documentation

## 2021-10-21 DIAGNOSIS — Z880 Allergy status to penicillin: Secondary | ICD-10-CM | POA: Diagnosis not present

## 2021-10-21 DIAGNOSIS — Z951 Presence of aortocoronary bypass graft: Secondary | ICD-10-CM | POA: Diagnosis not present

## 2021-10-21 DIAGNOSIS — Z01818 Encounter for other preprocedural examination: Secondary | ICD-10-CM

## 2021-10-21 DIAGNOSIS — I10 Essential (primary) hypertension: Secondary | ICD-10-CM | POA: Diagnosis not present

## 2021-10-21 DIAGNOSIS — I2510 Atherosclerotic heart disease of native coronary artery without angina pectoris: Secondary | ICD-10-CM | POA: Diagnosis not present

## 2021-10-21 DIAGNOSIS — Z79899 Other long term (current) drug therapy: Secondary | ICD-10-CM | POA: Diagnosis not present

## 2021-10-21 DIAGNOSIS — I714 Abdominal aortic aneurysm, without rupture, unspecified: Secondary | ICD-10-CM | POA: Insufficient documentation

## 2021-10-21 DIAGNOSIS — Z7901 Long term (current) use of anticoagulants: Secondary | ICD-10-CM | POA: Insufficient documentation

## 2021-10-21 DIAGNOSIS — I7143 Infrarenal abdominal aortic aneurysm, without rupture: Secondary | ICD-10-CM | POA: Diagnosis not present

## 2021-10-21 DIAGNOSIS — F17210 Nicotine dependence, cigarettes, uncomplicated: Secondary | ICD-10-CM | POA: Diagnosis not present

## 2021-10-21 DIAGNOSIS — K219 Gastro-esophageal reflux disease without esophagitis: Secondary | ICD-10-CM | POA: Diagnosis not present

## 2021-10-21 DIAGNOSIS — Z5181 Encounter for therapeutic drug level monitoring: Secondary | ICD-10-CM | POA: Insufficient documentation

## 2021-10-21 DIAGNOSIS — Z96641 Presence of right artificial hip joint: Secondary | ICD-10-CM | POA: Diagnosis not present

## 2021-10-21 DIAGNOSIS — I9789 Other postprocedural complications and disorders of the circulatory system, not elsewhere classified: Secondary | ICD-10-CM | POA: Diagnosis not present

## 2021-10-21 DIAGNOSIS — I452 Bifascicular block: Secondary | ICD-10-CM | POA: Diagnosis not present

## 2021-10-21 DIAGNOSIS — Z8249 Family history of ischemic heart disease and other diseases of the circulatory system: Secondary | ICD-10-CM | POA: Diagnosis not present

## 2021-10-21 DIAGNOSIS — I252 Old myocardial infarction: Secondary | ICD-10-CM | POA: Diagnosis not present

## 2021-10-21 DIAGNOSIS — Z7982 Long term (current) use of aspirin: Secondary | ICD-10-CM | POA: Diagnosis not present

## 2021-10-21 DIAGNOSIS — E785 Hyperlipidemia, unspecified: Secondary | ICD-10-CM | POA: Diagnosis not present

## 2021-10-21 LAB — COMPREHENSIVE METABOLIC PANEL
ALT: 16 U/L (ref 0–44)
AST: 21 U/L (ref 15–41)
Albumin: 3.6 g/dL (ref 3.5–5.0)
Alkaline Phosphatase: 64 U/L (ref 38–126)
Anion gap: 6 (ref 5–15)
BUN: 17 mg/dL (ref 8–23)
CO2: 26 mmol/L (ref 22–32)
Calcium: 9 mg/dL (ref 8.9–10.3)
Chloride: 107 mmol/L (ref 98–111)
Creatinine, Ser: 0.78 mg/dL (ref 0.61–1.24)
GFR, Estimated: >60 mL/min (ref >60)
Glucose, Bld: 101 mg/dL — ABNORMAL HIGH (ref 70–99)
Potassium: 4.5 mmol/L (ref 3.5–5.1)
Sodium: 139 mmol/L (ref 135–145)
Total Bilirubin: 0.5 mg/dL (ref 0.3–1.2)
Total Protein: 6 g/dL — ABNORMAL LOW (ref 6.5–8.1)

## 2021-10-21 LAB — CBC
HCT: 42.1 % (ref 39.0–52.0)
Hemoglobin: 13.4 g/dL (ref 13.0–17.0)
MCH: 31.2 pg (ref 26.0–34.0)
MCHC: 31.8 g/dL (ref 30.0–36.0)
MCV: 98.1 fL (ref 80.0–100.0)
Platelets: 211 10*3/uL (ref 150–400)
RBC: 4.29 MIL/uL (ref 4.22–5.81)
RDW: 13.4 % (ref 11.5–15.5)
WBC: 9.4 10*3/uL (ref 4.0–10.5)
nRBC: 0 % (ref 0.0–0.2)

## 2021-10-21 LAB — APTT: aPTT: 29 seconds (ref 24–36)

## 2021-10-21 LAB — PROTIME-INR
INR: 1.1 (ref 0.8–1.2)
Prothrombin Time: 13.9 seconds (ref 11.4–15.2)

## 2021-10-21 LAB — URINALYSIS, ROUTINE W REFLEX MICROSCOPIC
Bacteria, UA: NONE SEEN
Bilirubin Urine: NEGATIVE
Glucose, UA: NEGATIVE mg/dL
Hgb urine dipstick: NEGATIVE
Ketones, ur: NEGATIVE mg/dL
Nitrite: NEGATIVE
Protein, ur: NEGATIVE mg/dL
Specific Gravity, Urine: 1.017 (ref 1.005–1.030)
pH: 6 (ref 5.0–8.0)

## 2021-10-21 LAB — TYPE AND SCREEN
ABO/RH(D): A NEG
Antibody Screen: NEGATIVE

## 2021-10-21 NOTE — Progress Notes (Signed)
PCP - Dr. Pricilla Holm Cardiologist - Dr. Gwyndolyn Kaufman  PPM/ICD - denies   Chest x-ray - 01/01/10 EKG - 10/02/21 Stress Test - 10/15/10 ECHO - 10/18/03 Cardiac Cath - 04/05/03  Sleep Study - denies   DM- denies  Blood Thinner Instructions: n/a Aspirin Instructions: pt instructed to hold unless other instructions given. If no instructions given, contact surgeon  ERAS Protcol - no, NPO   COVID TEST- 10/21/2021 at PAT   Anesthesia review: yes, cardiac hx  Patient denies shortness of breath, fever, cough and chest pain at PAT appointment   All instructions explained to the patient, with a verbal understanding of the material. Patient agrees to go over the instructions while at home for a better understanding. Patient also instructed to wear a mask in public after being tested for COVID-19. The opportunity to ask questions was provided.

## 2021-10-21 NOTE — Pre-Procedure Instructions (Signed)
Surgical Instructions    Your procedure is scheduled on Wednesday, November 16th.  Report to [facility] Main Entrance "A" at 09:00 A.M., then check in with the Admitting office.  Call this number if you have problems the morning of surgery:  [phone]   If you have any questions prior to your surgery date call [phone]: Open Monday-Friday 8am-4pm    Remember:  Do not eat or drink after midnight the night before your surgery      Take these medicines the morning of surgery with A SIP OF WATER  carvedilol (COREG)  esomeprazole (NEXIUM)  rosuvastatin (CRESTOR)   If needed: HYDROcodone-acetaminophen (NORCO) ipratropium (ATROVENT) 0.03 % nasal spray nitroGLYCERIN (NITROSTAT)- notify RN on arrival to pre-op if you've had to take this traMADol (ULTRAM)    As of today, STOP taking any Aspirin (unless otherwise instructed by your surgeon) Aleve, Naproxen, Ibuprofen, Motrin, Advil, Goody's, BC's, all herbal medications, fish oil, and all vitamins.                     Do NOT Smoke (Tobacco/Vaping) or drink Alcohol 24 hours prior to your procedure.  If you use a CPAP at night, you may bring all equipment for your overnight stay.   Contacts, glasses, piercing's, hearing aid's, dentures or partials may not be worn into surgery, please bring cases for these belongings.    For patients admitted to the hospital, discharge time will be determined by your treatment team.   Patients discharged the day of surgery will not be allowed to drive home, and someone needs to stay with them for 24 hours.  NO VISITORS WILL BE ALLOWED IN PRE-OP WHERE PATIENTS GET READY FOR SURGERY.  ONLY 1 SUPPORT PERSON MAY BE PRESENT IN THE WAITING ROOM WHILE YOU ARE IN SURGERY.  IF YOU ARE TO BE ADMITTED, ONCE YOU ARE IN YOUR ROOM YOU WILL BE ALLOWED TWO (2) VISITORS.  Minor children may have two parents present. Special consideration for safety and communication needs will be reviewed on a case by case  basis.   Special instructions:   [facility]- Preparing For Surgery  Before surgery, you can play an important role. Because skin is not sterile, your skin needs to be as free of germs as possible. You can reduce the number of germs on your skin by washing with CHG (chlorahexidine gluconate) Soap before surgery.  CHG is an antiseptic cleaner which kills germs and bonds with the skin to continue killing germs even after washing.    Oral Hygiene is also important to reduce your risk of infection.  Remember - BRUSH YOUR TEETH THE MORNING OF SURGERY WITH YOUR REGULAR TOOTHPASTE  Please do not use if you have an allergy to CHG or antibacterial soaps. If your skin becomes reddened/irritated stop using the CHG.  Do not shave (including legs and underarms) for at least 48 hours prior to first CHG shower. It is OK to shave your face.  Please follow these instructions carefully.   Shower the NIGHT BEFORE SURGERY and the MORNING OF SURGERY  If you chose to wash your hair, wash your hair first as usual with your normal shampoo.  After you shampoo, rinse your hair and body thoroughly to remove the shampoo.  Use CHG Soap as you would any other liquid soap. You can apply CHG directly to the skin and wash gently with a scrungie or a clean washcloth.   Apply the CHG Soap to your body ONLY FROM THE NECK  DOWN.  Do not use on open wounds or open sores. Avoid contact with your eyes, ears, mouth and genitals (private parts). Wash Face and genitals (private parts)  with your normal soap.   Wash thoroughly, paying special attention to the area where your surgery will be performed.  Thoroughly rinse your body with warm water from the neck down.  DO NOT shower/wash with your normal soap after using and rinsing off the CHG Soap.  Pat yourself dry with a CLEAN TOWEL.  Wear CLEAN PAJAMAS to bed the night before surgery  Place CLEAN SHEETS on your bed the night before your surgery  DO NOT SLEEP WITH  PETS.   Day of Surgery: Shower with CHG soap. Do not wear jewelry Do not wear lotions, powders, colognes, or deodorant.  Men may shave face and neck. Do not bring valuables to the hospital. [facility] is not responsible for any belongings or valuables. Wear Clean/Comfortable clothing the morning of surgery Remember to brush your teeth WITH YOUR REGULAR TOOTHPASTE.   Please read over the following fact sheets that you were given.   3 days prior to your procedure or After your COVID test   You are not required to quarantine however you are required to wear a well-fitting mask when you are out and around people not in your household. If your mask becomes wet or soiled, replace with a new one.   Wash your hands often with soap and water for 20 seconds or clean your hands with an alcohol-based hand sanitizer that contains at least 60% alcohol.   Do not share personal items.   Notify your provider:  o if you are in close contact with someone who has COVID  o or if you develop a fever of 100.4 or greater, sneezing, cough, sore throat, shortness of breath or body aches.

## 2021-10-22 ENCOUNTER — Encounter: Payer: Self-pay | Admitting: Physical Medicine & Rehabilitation

## 2021-10-22 ENCOUNTER — Encounter: Payer: PPO | Admitting: Physical Medicine & Rehabilitation

## 2021-10-22 VITALS — BP 159/87 | HR 86 | Ht 70.0 in | Wt 170.8 lb

## 2021-10-22 DIAGNOSIS — G8929 Other chronic pain: Secondary | ICD-10-CM | POA: Insufficient documentation

## 2021-10-22 DIAGNOSIS — M533 Sacrococcygeal disorders, not elsewhere classified: Secondary | ICD-10-CM | POA: Insufficient documentation

## 2021-10-22 LAB — SURGICAL PCR SCREEN
MRSA, PCR: POSITIVE — AB
Staphylococcus aureus: POSITIVE — AB

## 2021-10-22 LAB — SARS CORONAVIRUS 2 (TAT 6-24 HRS): SARS Coronavirus 2: NEGATIVE

## 2021-10-22 MED ORDER — CHLORHEXIDINE GLUCONATE CLOTH 2 % EX PADS: 6.0000 | MEDICATED_PAD | Freq: Once | CUTANEOUS | Status: DC

## 2021-10-22 NOTE — Patient Instructions (Signed)
May repeat Left Sacroiliac RF in 92months if needed

## 2021-10-22 NOTE — Progress Notes (Signed)
Subjective:    Patient ID: [name] [name], male    DOB: 04-06-1941, 80 y.o.   MRN: [id number]  HPI  Left sacroiliac pain gone since RF procdure  06/21/21 Left sacroiliac radio frequency ablation under fluoroscopic guidance This consists of L5 dorsal ramus radio frequency ablation plus neuro lysis of S1-S2 -S3 lateral branches  L3-4 decompression 2017 per Dr. Rolena Infante  Spinal cord stim placement 2019 per Dr. Maryjean Ka, battery is in the left low back region, patient uses stimulator on a daily basis. Patient states that his current pain listed below is in his back not in the left buttock region which was so painful before. Pain Inventory Average Pain 5 Pain Right Now 9   5 sitting 9 standing My pain is aching  In the last 24 hours, has pain interfered with the following? General activity 7 Relation with others 7 Enjoyment of life 7 What TIME of day is your pain at its worst? daytime Sleep (in general) Fair  Pain is worse with: walking Pain improves with: heat/ice and medication Relief from Meds: 7  Family History  Problem Relation Age of Onset   Kidney disease Mother    COPD Mother    Stroke Father        cerebral hemorrhage   Heart disease Brother    Heart disease Brother    Arthritis Brother    Cancer Paternal Grandfather    Social History   Socioeconomic History   Marital status: Married    Spouse name: Not on file   Number of children: 2   Years of education: 12   Highest education level: Not on file  Occupational History   Occupation: trucking     Employer: RETIRED  Tobacco Use   Smoking status: Every Day    Packs/day: 0.50    Years: 65.00    Pack years: 32.50    Types: Cigarettes   Smokeless tobacco: Never   Tobacco comments:    smoking since age 62 yrs old  Vaping Use   Vaping Use: Never used  Substance and Sexual Activity   Alcohol use: Not Currently   Drug use: No   Sexual activity: Not Currently  Other Topics Concern   Not on file  Social  History Narrative   HSG, Married '62, 1 son-'70, 1 dtr - '68; 2 grandchildren. Work - Probation officer.   ACP - DNR, DNI, no artificial hydration or feeding, no heroic measures   Social Determinants of Health   Financial Resource Strain: Not on file  Food Insecurity: Not on file  Transportation Needs: Not on file  Physical Activity: Not on file  Stress: Not on file  Social Connections: Not on file   Past Surgical History:  Procedure Laterality Date   BACK SURGERY     COLONOSCOPY     COLONOSCOPY WITH PROPOFOL N/A 07/13/2017   Procedure: COLONOSCOPY WITH PROPOFOL;  Surgeon: Manus Gunning, MD;  Location: [facility];  Service: Gastroenterology;  Laterality: N/A;   CORONARY ARTERY BYPASS GRAFT  2004   CYSTOSCOPY WITH INSERTION OF UROLIFT N/A 12/14/2018   Procedure: CYSTOSCOPY WITH INSERTION OF UROLIFT;  Surgeon: Franchot Gallo, MD;  Location: [facility];  Service: Urology;  Laterality: N/A;   CYSTOSCOPY WITH INSERTION OF UROLIFT N/A 09/04/2020   Procedure: CYSTOSCOPY WITH INSERTION OF UROLIFT;  Surgeon: Franchot Gallo, MD;  Location: [facility];  Service: Urology;  Laterality: N/A;   DECOMPRESSIVE LUMBAR LAMINECTOMY LEVEL 1 N/A 09/04/2016   Procedure: DECOMPRESSION L3 - L4;  Surgeon: Melina Schools, MD;  Location: [place];  Service: Orthopedics;  Laterality: N/A;   EYE SURGERY  2013   bilateral cataract extraction w/ IOL (Dr. Celene Squibb), retina surgery   JOINT REPLACEMENT     LUMBAR LAMINECTOMY/DECOMPRESSION MICRODISCECTOMY  09/04/2016   L3-4   SPINAL CORD STIMULATOR INSERTION N/A 05/13/2019   Procedure: LUMBAR SPINAL CORD STIMULATOR INSERTION;  Surgeon: Clydell Hakim, MD;  Location: [place];  Service: Neurosurgery;  Laterality: N/A;  LUMBAR SPINAL CORD STIMULATOR INSERTION   TONSILLECTOMY     TOTAL HIP ARTHROPLASTY Right 10/2003   Past Surgical History:  Procedure Laterality Date   BACK SURGERY     COLONOSCOPY     COLONOSCOPY WITH PROPOFOL N/A 07/13/2017   Procedure:  COLONOSCOPY WITH PROPOFOL;  Surgeon: Manus Gunning, MD;  Location: [facility];  Service: Gastroenterology;  Laterality: N/A;   CORONARY ARTERY BYPASS GRAFT  2004   CYSTOSCOPY WITH INSERTION OF UROLIFT N/A 12/14/2018   Procedure: CYSTOSCOPY WITH INSERTION OF UROLIFT;  Surgeon: Franchot Gallo, MD;  Location: [facility];  Service: Urology;  Laterality: N/A;   CYSTOSCOPY WITH INSERTION OF UROLIFT N/A 09/04/2020   Procedure: CYSTOSCOPY WITH INSERTION OF UROLIFT;  Surgeon: Franchot Gallo, MD;  Location: [facility];  Service: Urology;  Laterality: N/A;   DECOMPRESSIVE LUMBAR LAMINECTOMY LEVEL 1 N/A 09/04/2016   Procedure: DECOMPRESSION L3 - L4;  Surgeon: Melina Schools, MD;  Location: [place];  Service: Orthopedics;  Laterality: N/A;   EYE SURGERY  2013   bilateral cataract extraction w/ IOL (Dr. Celene Squibb), retina surgery   JOINT REPLACEMENT     LUMBAR LAMINECTOMY/DECOMPRESSION MICRODISCECTOMY  09/04/2016   L3-4   SPINAL CORD STIMULATOR INSERTION N/A 05/13/2019   Procedure: LUMBAR SPINAL CORD STIMULATOR INSERTION;  Surgeon: Clydell Hakim, MD;  Location: [place];  Service: Neurosurgery;  Laterality: N/A;  LUMBAR SPINAL CORD STIMULATOR INSERTION   TONSILLECTOMY     TOTAL HIP ARTHROPLASTY Right 10/2003   Past Medical History:  Diagnosis Date   AAA (abdominal aortic aneurysm)    Back pain    decompression belt bought off of TV   CAD (coronary artery disease)    Carotid artery disease ([place])    a. duplex 2015 - 1-39% bilaterally.   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 Ralene Ok   BP (!) 159/87   Pulse 86   Ht 5\' 10"  (1.778 m)   Wt 170 lb 12.8 oz (77.5 kg)   SpO2 95%   BMI 24.51 kg/m   Opioid Risk Score:   Fall Risk Score:  `1  Depression screen PHQ 2/9  Depression screen [facility] 2/9 10/22/2021 04/04/2021 06/07/2020 05/17/2019 05/14/2018 08/31/2017 05/14/2017  Decreased Interest 0 0 0 0 0 0 0  Down, Depressed, Hopeless 0 0 0 0 1 0 1  PHQ - 2 Score 0 0 0 0 1 0 1  Altered sleeping - 2 - - 0 - -  Tired, decreased  energy - 2 - - 1 - -  Change in appetite - 0 - - 0 - -  Feeling bad or failure about yourself  - 0 - - 1 - -  Trouble concentrating - 0 - - 0 - -  Moving slowly or fidgety/restless - 0 - - 0 - -  Suicidal thoughts - 0 - - 0 - -  PHQ-9 Score - 4 - - 3 - -  Difficult doing work/chores - Somewhat difficult - - Not difficult at all - -  Some recent data might be hidden     Review of Systems  Constitutional: Negative.   HENT: Negative.    Eyes: Negative.   Respiratory: Negative.    Cardiovascular: Negative.   Gastrointestinal: Negative.   Endocrine: Negative.   Genitourinary: Negative.   Musculoskeletal:  Positive for back pain.       Having AAA repair tomorrow.  Skin: Negative.   Allergic/Immunologic: Negative.   Neurological: Negative.   Hematological: Negative.   Psychiatric/Behavioral: Negative.    All other systems reviewed and are negative.     Objective:   Physical Exam Vitals and nursing note reviewed.  Constitutional:      Appearance: Normal appearance.  HENT:     Head: Normocephalic and atraumatic.  Eyes:     Extraocular Movements: Extraocular movements intact.     Conjunctiva/sclera: Conjunctivae normal.     Pupils: Pupils are equal, round, and reactive to light.  Musculoskeletal:     Comments:  Sacral thrust (prone) :neg  FABER's: Positive on the right Distraction (supine): Negative Thigh thrust test: Negative  There is decreased lumbar spine range of motion Ambulates without  assistive device no evidence of toe drag or knee instability  Neurological:     Mental Status: He is alert.          Assessment & Plan:   1.  Left sacroiliac pain improved after sacroiliac RF 4 months post.  As discussed with patient difficult to predict duration of response.  At earliest we can repeat in 6 months if needed.

## 2021-10-23 ENCOUNTER — Encounter (HOSPITAL_COMMUNITY): Payer: Self-pay | Admitting: Vascular Surgery

## 2021-10-23 ENCOUNTER — Inpatient Hospital Stay (HOSPITAL_COMMUNITY): Payer: PPO

## 2021-10-23 ENCOUNTER — Encounter (HOSPITAL_COMMUNITY)
Admission: RE | Disposition: A | Discharge status: Home / Self Care | Payer: Self-pay | Source: Home / Self Care | Attending: Vascular Surgery

## 2021-10-23 ENCOUNTER — Inpatient Hospital Stay (HOSPITAL_COMMUNITY)
Admission: RE | Admit: 2021-10-23 | Discharge: 2021-10-24 | DRG: 269 | Disposition: A | Discharge status: Home / Self Care | Payer: PPO | Attending: Vascular Surgery | Admitting: Vascular Surgery

## 2021-10-23 ENCOUNTER — Other Ambulatory Visit: Payer: Self-pay

## 2021-10-23 DIAGNOSIS — D696 Thrombocytopenia, unspecified: Secondary | ICD-10-CM | POA: Diagnosis not present

## 2021-10-23 DIAGNOSIS — I2510 Atherosclerotic heart disease of native coronary artery without angina pectoris: Secondary | ICD-10-CM | POA: Diagnosis present

## 2021-10-23 DIAGNOSIS — Y832 Surgical operation with anastomosis, bypass or graft as the cause of abnormal reaction of the patient, or of later complication, without mention of misadventure at the time of the procedure: Secondary | ICD-10-CM | POA: Diagnosis not present

## 2021-10-23 DIAGNOSIS — E785 Hyperlipidemia, unspecified: Secondary | ICD-10-CM | POA: Diagnosis present

## 2021-10-23 DIAGNOSIS — F17210 Nicotine dependence, cigarettes, uncomplicated: Secondary | ICD-10-CM | POA: Diagnosis present

## 2021-10-23 DIAGNOSIS — I252 Old myocardial infarction: Secondary | ICD-10-CM

## 2021-10-23 DIAGNOSIS — Z880 Allergy status to penicillin: Secondary | ICD-10-CM | POA: Diagnosis not present

## 2021-10-23 DIAGNOSIS — J9811 Atelectasis: Secondary | ICD-10-CM | POA: Diagnosis not present

## 2021-10-23 DIAGNOSIS — K219 Gastro-esophageal reflux disease without esophagitis: Secondary | ICD-10-CM | POA: Diagnosis present

## 2021-10-23 DIAGNOSIS — I7143 Infrarenal abdominal aortic aneurysm, without rupture: Secondary | ICD-10-CM | POA: Diagnosis not present

## 2021-10-23 DIAGNOSIS — Z20822 Contact with and (suspected) exposure to covid-19: Secondary | ICD-10-CM | POA: Diagnosis not present

## 2021-10-23 DIAGNOSIS — Z8249 Family history of ischemic heart disease and other diseases of the circulatory system: Secondary | ICD-10-CM | POA: Diagnosis not present

## 2021-10-23 DIAGNOSIS — Z951 Presence of aortocoronary bypass graft: Secondary | ICD-10-CM

## 2021-10-23 DIAGNOSIS — I714 Abdominal aortic aneurysm, without rupture, unspecified: Secondary | ICD-10-CM | POA: Diagnosis present

## 2021-10-23 DIAGNOSIS — Z79899 Other long term (current) drug therapy: Secondary | ICD-10-CM

## 2021-10-23 DIAGNOSIS — Z9889 Other specified postprocedural states: Secondary | ICD-10-CM

## 2021-10-23 DIAGNOSIS — I452 Bifascicular block: Secondary | ICD-10-CM | POA: Diagnosis present

## 2021-10-23 DIAGNOSIS — Z7982 Long term (current) use of aspirin: Secondary | ICD-10-CM

## 2021-10-23 DIAGNOSIS — I9789 Other postprocedural complications and disorders of the circulatory system, not elsewhere classified: Secondary | ICD-10-CM | POA: Diagnosis not present

## 2021-10-23 DIAGNOSIS — Z96641 Presence of right artificial hip joint: Secondary | ICD-10-CM | POA: Diagnosis present

## 2021-10-23 DIAGNOSIS — I10 Essential (primary) hypertension: Secondary | ICD-10-CM | POA: Diagnosis not present

## 2021-10-23 DIAGNOSIS — Z8679 Personal history of other diseases of the circulatory system: Principal | ICD-10-CM

## 2021-10-23 HISTORY — PX: ABDOMINAL AORTIC ENDOVASCULAR STENT GRAFT: SHX5707

## 2021-10-23 HISTORY — PX: ULTRASOUND GUIDANCE FOR VASCULAR ACCESS: SHX6516

## 2021-10-23 LAB — CBC
HCT: 38.2 % — ABNORMAL LOW (ref 39.0–52.0)
Hemoglobin: 12.7 g/dL — ABNORMAL LOW (ref 13.0–17.0)
MCH: 31.8 pg (ref 26.0–34.0)
MCHC: 33.2 g/dL (ref 30.0–36.0)
MCV: 95.5 fL (ref 80.0–100.0)
Platelets: 185 10*3/uL (ref 150–400)
RBC: 4 MIL/uL — ABNORMAL LOW (ref 4.22–5.81)
RDW: 13.3 % (ref 11.5–15.5)
WBC: 11.8 10*3/uL — ABNORMAL HIGH (ref 4.0–10.5)
nRBC: 0 % (ref 0.0–0.2)

## 2021-10-23 LAB — BASIC METABOLIC PANEL
Anion gap: 6 (ref 5–15)
BUN: 9 mg/dL (ref 8–23)
CO2: 22 mmol/L (ref 22–32)
Calcium: 8.5 mg/dL — ABNORMAL LOW (ref 8.9–10.3)
Chloride: 109 mmol/L (ref 98–111)
Creatinine, Ser: 0.62 mg/dL (ref 0.61–1.24)
GFR, Estimated: >60 mL/min (ref >60)
Glucose, Bld: 121 mg/dL — ABNORMAL HIGH (ref 70–99)
Potassium: 4 mmol/L (ref 3.5–5.1)
Sodium: 137 mmol/L (ref 135–145)

## 2021-10-23 LAB — POCT ACTIVATED CLOTTING TIME
Activated Clotting Time: 219 seconds
Activated Clotting Time: 231 seconds
Activated Clotting Time: 254 seconds

## 2021-10-23 SURGERY — INSERTION, ENDOVASCULAR STENT GRAFT, AORTA, ABDOMINAL
Anesthesia: General | Site: Groin

## 2021-10-23 MED ORDER — ORAL CARE MOUTH RINSE: 15.0000 mL | Freq: Once | OROMUCOSAL | Status: AC

## 2021-10-23 MED ORDER — DEXAMETHASONE SODIUM PHOSPHATE 10 MG/ML IJ SOLN
INTRAMUSCULAR | Status: DC | PRN
  Administered 2021-10-23: 5 mg via INTRAVENOUS

## 2021-10-23 MED ORDER — FENTANYL CITRATE (PF) 100 MCG/2ML IJ SOLN: 25.0000 ug | INTRAMUSCULAR | Status: DC | PRN

## 2021-10-23 MED ORDER — LIDOCAINE 2% (20 MG/ML) 5 ML SYRINGE
INTRAMUSCULAR | Status: DC | PRN
  Administered 2021-10-23: 60 mg via INTRAVENOUS

## 2021-10-23 MED ORDER — ACETAMINOPHEN 650 MG RE SUPP: 325.0000 mg | RECTAL | Status: DC | PRN

## 2021-10-23 MED ORDER — IODIXANOL 320 MG/ML IV SOLN
INTRAVENOUS | Status: DC | PRN
  Administered 2021-10-23: 74 mL

## 2021-10-23 MED ORDER — PROPOFOL 10 MG/ML IV BOLUS
INTRAVENOUS | Status: DC | PRN
  Administered 2021-10-23: 140 mg via INTRAVENOUS

## 2021-10-23 MED ORDER — ASPIRIN EC 81 MG PO TBEC
81.0000 mg | DELAYED_RELEASE_TABLET | Freq: Every morning | ORAL | Status: DC
  Administered 2021-10-24: 06:00:00 81 mg via ORAL
  Filled 2021-10-23: qty 1

## 2021-10-23 MED ORDER — GUAIFENESIN-DM 100-10 MG/5ML PO SYRP: 15.0000 mL | ORAL_SOLUTION | ORAL | Status: DC | PRN

## 2021-10-23 MED ORDER — SODIUM CHLORIDE 0.9 % IV SOLN: INTRAVENOUS | Status: DC

## 2021-10-23 MED ORDER — LISINOPRIL 40 MG PO TABS: 40.0000 mg | ORAL_TABLET | Freq: Every day | ORAL | Status: DC

## 2021-10-23 MED ORDER — 0.9 % SODIUM CHLORIDE (POUR BTL) OPTIME
TOPICAL | Status: DC | PRN
  Administered 2021-10-23: 1000 mL

## 2021-10-23 MED ORDER — FENTANYL CITRATE (PF) 250 MCG/5ML IJ SOLN
INTRAMUSCULAR | Status: DC | PRN
  Administered 2021-10-23: 100 ug via INTRAVENOUS

## 2021-10-23 MED ORDER — HEPARIN 6000 UNIT IRRIGATION SOLUTION
Status: AC
  Filled 2021-10-23: qty 500

## 2021-10-23 MED ORDER — ROSUVASTATIN CALCIUM 20 MG PO TABS
20.0000 mg | ORAL_TABLET | Freq: Every day | ORAL | Status: DC
  Administered 2021-10-24: 09:00:00 20 mg via ORAL
  Filled 2021-10-23: qty 1

## 2021-10-23 MED ORDER — ALUM & MAG HYDROXIDE-SIMETH 200-200-20 MG/5ML PO SUSP: 15.0000 mL | ORAL | Status: DC | PRN

## 2021-10-23 MED ORDER — EPHEDRINE 5 MG/ML INJ
INTRAVENOUS | Status: AC
  Filled 2021-10-23: qty 5

## 2021-10-23 MED ORDER — PROTAMINE SULFATE 10 MG/ML IV SOLN
INTRAVENOUS | Status: AC
  Filled 2021-10-23: qty 5

## 2021-10-23 MED ORDER — CARVEDILOL 12.5 MG PO TABS
12.5000 mg | ORAL_TABLET | Freq: Two times a day (BID) | ORAL | Status: DC
  Administered 2021-10-23 – 2021-10-24 (×2): 12.5 mg via ORAL
  Filled 2021-10-23 (×2): qty 1

## 2021-10-23 MED ORDER — HEPARIN SODIUM (PORCINE) 1000 UNIT/ML IJ SOLN
INTRAMUSCULAR | Status: AC
  Filled 2021-10-23: qty 1

## 2021-10-23 MED ORDER — HYDRALAZINE HCL 20 MG/ML IJ SOLN: 5.0000 mg | INTRAMUSCULAR | Status: DC | PRN

## 2021-10-23 MED ORDER — POTASSIUM CHLORIDE CRYS ER 20 MEQ PO TBCR
20.0000 meq | EXTENDED_RELEASE_TABLET | Freq: Every day | ORAL | Status: DC | PRN
Start: 2021-10-23 — End: 2021-10-24

## 2021-10-23 MED ORDER — ROCURONIUM BROMIDE 10 MG/ML (PF) SYRINGE
PREFILLED_SYRINGE | INTRAVENOUS | Status: DC | PRN
  Administered 2021-10-23: 10 mg via INTRAVENOUS
  Administered 2021-10-23: 60 mg via INTRAVENOUS

## 2021-10-23 MED ORDER — GLYCOPYRROLATE PF 0.2 MG/ML IJ SOSY
PREFILLED_SYRINGE | INTRAMUSCULAR | Status: AC
  Filled 2021-10-23: qty 1

## 2021-10-23 MED ORDER — SUGAMMADEX SODIUM 200 MG/2ML IV SOLN
INTRAVENOUS | Status: DC | PRN
  Administered 2021-10-23: 153.4 mg via INTRAVENOUS

## 2021-10-23 MED ORDER — PANTOPRAZOLE SODIUM 40 MG PO TBEC
40.0000 mg | DELAYED_RELEASE_TABLET | Freq: Every day | ORAL | Status: DC
  Administered 2021-10-23 – 2021-10-24 (×2): 40 mg via ORAL
  Filled 2021-10-23 (×2): qty 1

## 2021-10-23 MED ORDER — LACTATED RINGERS IV SOLN: INTRAVENOUS | Status: DC

## 2021-10-23 MED ORDER — VANCOMYCIN HCL IN DEXTROSE 1-5 GM/200ML-% IV SOLN: 1000.0000 mg | INTRAVENOUS | Status: AC

## 2021-10-23 MED ORDER — TAMSULOSIN HCL 0.4 MG PO CAPS: 0.8000 mg | ORAL_CAPSULE | Freq: Every day | ORAL | Status: DC

## 2021-10-23 MED ORDER — HEPARIN SODIUM (PORCINE) 1000 UNIT/ML IJ SOLN
INTRAMUSCULAR | Status: DC | PRN
  Administered 2021-10-23: 8000 [IU] via INTRAVENOUS
  Administered 2021-10-23 (×2): 2000 [IU] via INTRAVENOUS

## 2021-10-23 MED ORDER — GLYCOPYRROLATE PF 0.2 MG/ML IJ SOSY
PREFILLED_SYRINGE | INTRAMUSCULAR | Status: DC | PRN
  Administered 2021-10-23: .1 mg via INTRAVENOUS

## 2021-10-23 MED ORDER — PHENYLEPHRINE HCL-NACL 20-0.9 MG/250ML-% IV SOLN
INTRAVENOUS | Status: DC | PRN
  Administered 2021-10-23: 50 ug/min via INTRAVENOUS
  Administered 2021-10-23: 30 ug/min via INTRAVENOUS

## 2021-10-23 MED ORDER — SENNOSIDES-DOCUSATE SODIUM 8.6-50 MG PO TABS
3.0000 | ORAL_TABLET | Freq: Every day | ORAL | Status: DC
Start: 2021-10-23 — End: 2021-10-24
  Filled 2021-10-23: qty 3

## 2021-10-23 MED ORDER — LABETALOL HCL 5 MG/ML IV SOLN: 10.0000 mg | INTRAVENOUS | Status: DC | PRN

## 2021-10-23 MED ORDER — PROTAMINE SULFATE 10 MG/ML IV SOLN
INTRAVENOUS | Status: DC | PRN
  Administered 2021-10-23: 50 mg via INTRAVENOUS

## 2021-10-23 MED ORDER — EPHEDRINE SULFATE 50 MG/ML IJ SOLN
INTRAMUSCULAR | Status: DC | PRN
  Administered 2021-10-23: 15 mg via INTRAVENOUS

## 2021-10-23 MED ORDER — ONDANSETRON HCL 4 MG/2ML IJ SOLN: 4.0000 mg | Freq: Four times a day (QID) | INTRAMUSCULAR | Status: DC | PRN

## 2021-10-23 MED ORDER — NITROGLYCERIN 0.4 MG SL SUBL: 0.4000 mg | SUBLINGUAL_TABLET | SUBLINGUAL | Status: DC | PRN

## 2021-10-23 MED ORDER — ONDANSETRON HCL 4 MG/2ML IJ SOLN
INTRAMUSCULAR | Status: DC | PRN
  Administered 2021-10-23: 4 mg via INTRAVENOUS

## 2021-10-23 MED ORDER — HEPARIN SODIUM (PORCINE) 5000 UNIT/ML IJ SOLN
5000.0000 [IU] | Freq: Three times a day (TID) | INTRAMUSCULAR | Status: DC
  Administered 2021-10-24: 06:00:00 5000 [IU] via SUBCUTANEOUS
  Filled 2021-10-23 (×2): qty 1

## 2021-10-23 MED ORDER — PHENOL 1.4 % MT LIQD: 1.0000 | OROMUCOSAL | Status: DC | PRN

## 2021-10-23 MED ORDER — PHENYLEPHRINE HCL (PRESSORS) 10 MG/ML IV SOLN
INTRAVENOUS | Status: DC | PRN
  Administered 2021-10-23 (×2): 80 ug via INTRAVENOUS

## 2021-10-23 MED ORDER — VANCOMYCIN HCL IN DEXTROSE 1-5 GM/200ML-% IV SOLN
INTRAVENOUS | Status: AC
  Administered 2021-10-23: 1000 mg via INTRAVENOUS
  Filled 2021-10-23: qty 200

## 2021-10-23 MED ORDER — MORPHINE SULFATE (PF) 2 MG/ML IV SOLN: 2.0000 mg | INTRAVENOUS | Status: DC | PRN

## 2021-10-23 MED ORDER — MAGNESIUM SULFATE 2 GM/50ML IV SOLN: 2.0000 g | Freq: Every day | INTRAVENOUS | Status: DC | PRN

## 2021-10-23 MED ORDER — SODIUM CHLORIDE 0.9 % IV SOLN: 500.0000 mL | Freq: Once | INTRAVENOUS | Status: DC | PRN

## 2021-10-23 MED ORDER — METOPROLOL TARTRATE 5 MG/5ML IV SOLN: 2.0000 mg | INTRAVENOUS | Status: DC | PRN

## 2021-10-23 MED ORDER — HEPARIN SODIUM (PORCINE) 1000 UNIT/ML IJ SOLN
INTRAMUSCULAR | Status: AC
  Filled 2021-10-23: qty 2

## 2021-10-23 MED ORDER — OXYCODONE-ACETAMINOPHEN 5-325 MG PO TABS
1.0000 | ORAL_TABLET | ORAL | Status: DC | PRN
  Administered 2021-10-23 – 2021-10-24 (×3): 2 via ORAL
  Filled 2021-10-23 (×3): qty 2

## 2021-10-23 MED ORDER — CHLORHEXIDINE GLUCONATE 0.12 % MT SOLN
15.0000 mL | Freq: Once | OROMUCOSAL | Status: AC
  Administered 2021-10-23: 15 mL via OROMUCOSAL
  Filled 2021-10-23: qty 15

## 2021-10-23 MED ORDER — DOCUSATE SODIUM 100 MG PO CAPS
100.0000 mg | ORAL_CAPSULE | Freq: Every day | ORAL | Status: DC
  Administered 2021-10-24: 09:00:00 100 mg via ORAL
  Filled 2021-10-23: qty 1

## 2021-10-23 MED ORDER — ACETAMINOPHEN 10 MG/ML IV SOLN: 1000.0000 mg | Freq: Once | INTRAVENOUS | Status: DC | PRN

## 2021-10-23 MED ORDER — HEPARIN 6000 UNIT IRRIGATION SOLUTION
Status: DC | PRN
  Administered 2021-10-23: 1

## 2021-10-23 MED ORDER — ACETAMINOPHEN 325 MG PO TABS: 325.0000 mg | ORAL_TABLET | ORAL | Status: DC | PRN

## 2021-10-23 MED ORDER — FENTANYL CITRATE (PF) 250 MCG/5ML IJ SOLN
INTRAMUSCULAR | Status: AC
  Filled 2021-10-23: qty 5

## 2021-10-23 SURGICAL SUPPLY — 61 items
ADH SKN CLS APL DERMABOND .7 (GAUZE/BANDAGES/DRESSINGS) ×4
APL PRP STRL LF DISP 70% ISPRP (MISCELLANEOUS) ×2
BAG COUNTER SPONGE SURGICOUNT (BAG) ×3 IMPLANT
BAG SPNG CNTER NS LX DISP (BAG) ×2
BAG SURGICOUNT SPONGE COUNTING (BAG) ×1
BLADE CLIPPER SURG (BLADE) ×4 IMPLANT
CANISTER SUCT 3000ML PPV (MISCELLANEOUS) ×4 IMPLANT
CATH BEACON 5 .035 40 KMP TP (CATHETERS) IMPLANT
CATH BEACON 5 .038 40 KMP TP (CATHETERS)
CATH BEACON 5.038 65CM KMP-01 (CATHETERS) ×4 IMPLANT
CATH OMNI FLUSH .035X70CM (CATHETERS) ×4 IMPLANT
CHLORAPREP W/TINT 26 (MISCELLANEOUS) ×4 IMPLANT
DERMABOND ADVANCED (GAUZE/BANDAGES/DRESSINGS) ×4
DERMABOND ADVANCED .7 DNX12 (GAUZE/BANDAGES/DRESSINGS) ×2 IMPLANT
DEVICE CLOSURE PERCLS PRGLD 6F (VASCULAR PRODUCTS) IMPLANT
DEVICE TORQUE KENDALL .025-038 (MISCELLANEOUS) IMPLANT
DRSG TEGADERM 2-3/8X2-3/4 SM (GAUZE/BANDAGES/DRESSINGS) ×8 IMPLANT
DRYSEAL FLEXSHEATH 14FR 33CM (SHEATH) ×2
DRYSEAL FLEXSHEATH 16FR 33CM (SHEATH) ×2
ELECT REM PT RETURN 9FT ADLT (ELECTROSURGICAL) ×8
ELECTRODE REM PT RTRN 9FT ADLT (ELECTROSURGICAL) ×4 IMPLANT
EXCLDR TRNK ENDO 26X14.5X12 16 (Endovascular Graft) ×4 IMPLANT
EXCLUDER TNK END 26X14.5X12 16 (Endovascular Graft) IMPLANT
EXTENDER ENDOPROSTHESIS 14X7 (Endovascular Graft) ×2 IMPLANT
GAUZE SPONGE 2X2 8PLY STRL LF (GAUZE/BANDAGES/DRESSINGS) ×4 IMPLANT
GLOVE SURG POLYISO LF SZ8 (GLOVE) ×4 IMPLANT
GOWN STRL REUS W/ TWL LRG LVL3 (GOWN DISPOSABLE) ×4 IMPLANT
GOWN STRL REUS W/ TWL XL LVL3 (GOWN DISPOSABLE) ×2 IMPLANT
GOWN STRL REUS W/TWL LRG LVL3 (GOWN DISPOSABLE) ×4
GOWN STRL REUS W/TWL XL LVL3 (GOWN DISPOSABLE) ×4
GRAFT BALLN CATH 65CM (STENTS) ×2 IMPLANT
GUIDEWIRE ANGLED .035X150CM (WIRE) IMPLANT
KIT BASIN OR (CUSTOM PROCEDURE TRAY) ×4 IMPLANT
KIT DRAIN CSF ACCUDRAIN (MISCELLANEOUS) IMPLANT
KIT MICROPUNCTURE NIT STIFF (SHEATH) ×2 IMPLANT
KIT TURNOVER KIT B (KITS) ×4 IMPLANT
LEG CONTRALATERAL 23X10 (Vascular Products) ×2 IMPLANT
NS IRRIG 1000ML POUR BTL (IV SOLUTION) ×4 IMPLANT
PACK ENDOVASCULAR (PACKS) ×4 IMPLANT
PAD ARMBOARD 7.5X6 YLW CONV (MISCELLANEOUS) ×8 IMPLANT
PERCLOSE PROGLIDE 6F (VASCULAR PRODUCTS)
SET MICROPUNCTURE 5F STIFF (MISCELLANEOUS) ×4 IMPLANT
SHEATH BRITE TIP 8FR 23CM (SHEATH) ×4 IMPLANT
SHEATH DRYSEAL FLEX 14FR 33CM (SHEATH) IMPLANT
SHEATH DRYSEAL FLEX 16FR 33CM (SHEATH) IMPLANT
SHEATH PINNACLE 8F 10CM (SHEATH) ×4 IMPLANT
SPONGE GAUZE 2X2 STER 10/PKG (GAUZE/BANDAGES/DRESSINGS) ×4
STENT GRAFT BALLN CATH 65CM (STENTS) ×4
STOPCOCK MORSE 400PSI 3WAY (MISCELLANEOUS) ×4 IMPLANT
SUT MNCRL AB 4-0 PS2 18 (SUTURE) ×8 IMPLANT
SUT PROLENE 5 0 C 1 24 (SUTURE) IMPLANT
SUT VIC AB 2-0 CT1 27 (SUTURE)
SUT VIC AB 2-0 CT1 TAPERPNT 27 (SUTURE) IMPLANT
SUT VIC AB 3-0 SH 27 (SUTURE)
SUT VIC AB 3-0 SH 27X BRD (SUTURE) IMPLANT
SYR 20CC LL (SYRINGE) ×8 IMPLANT
TOWEL GREEN STERILE (TOWEL DISPOSABLE) ×4 IMPLANT
TRAY FOLEY MTR SLVR 16FR STAT (SET/KITS/TRAYS/PACK) ×4 IMPLANT
TUBING INJECTOR 48 (MISCELLANEOUS) ×4 IMPLANT
WIRE AMPLATZ SS-J .035X180CM (WIRE) ×8 IMPLANT
WIRE BENTSON .035X145CM (WIRE) ×8 IMPLANT

## 2021-10-23 NOTE — Interval H&P Note (Signed)
History and Physical Interval Note:  10/23/2021 9:58 AM  [name] [name]  has presented today for surgery, with the diagnosis of AAA.  The various methods of treatment have been discussed with the patient and family. After consideration of risks, benefits and other options for treatment, the patient has consented to  Procedure(s): ABDOMINAL AORTIC ENDOVASCULAR STENT GRAFT (N/A) as a surgical intervention.  The patient's history has been reviewed, patient examined, no change in status, stable for surgery.  I have reviewed the patient's chart and labs.  Questions were answered to the patient's satisfaction.     Cherre Robins

## 2021-10-23 NOTE — Anesthesia Postprocedure Evaluation (Signed)
Anesthesia Post Note  Patient: [name] [name]  Procedure(s) Performed: ABDOMINAL AORTIC ENDOVASCULAR STENT GRAFT (Abdomen) ULTRASOUND GUIDANCE FOR VASCULAR ACCESS (Bilateral: Groin)     Patient location during evaluation: PACU Anesthesia Type: General Level of consciousness: awake and alert Pain management: pain level controlled Vital Signs Assessment: post-procedure vital signs reviewed and stable Respiratory status: spontaneous breathing, nonlabored ventilation, respiratory function stable and patient connected to nasal cannula oxygen Cardiovascular status: blood pressure returned to baseline and stable Postop Assessment: no apparent nausea or vomiting Anesthetic complications: no   No notable events documented.  Last Vitals:  Vitals:   10/23/21 1415 10/23/21 1500  BP: 125/71 126/80  Pulse: 74 72  Resp: 18 18  Temp: 36.4 C 36.4 C  SpO2: 94% 94%    Last Pain:  Vitals:   10/23/21 1625  TempSrc:   PainSc: 4                  Taha Dimond P Daimien Patmon

## 2021-10-23 NOTE — Progress Notes (Signed)
Patient to 4E10 from PACU. Vital signs obtained. On monitor CCMD notified. Bilateral groin incisions level 0. CHG bath completed. Alert and oriented to room and call light. Call bell within reach.  Era Bumpers, RN

## 2021-10-23 NOTE — Op Note (Signed)
DATE OF SERVICE: 10/23/2021  PATIENT:  [name] [name]  80 y.o. male  PRE-OPERATIVE DIAGNOSIS:  43mm infrarenal abdominal aortic aneurysm without rupture  POST-OPERATIVE DIAGNOSIS:  Same  PROCEDURE:   1) bilateral ultrasound guided percutaneous common femoral artery access and large bore closure (CPT [phone]) 2) endovascular aortic aneurysm repair (CPT [phone])  SURGEON:  Surgeon(s) and Role:    * Cherre Robins, MD - Primary  ASSISTANT: Arlee Muslim, PA-C  An assistant was required to facilitate exposure and expedite the case.  ANESTHESIA:   general  EBL: 40mL  BLOOD ADMINISTERED:none  DRAINS: none   LOCAL MEDICATIONS USED:  NONE  SPECIMEN:  none  COUNTS: confirmed correct.  TOURNIQUET:  none  PATIENT DISPOSITION:  PACU - hemodynamically stable.   Delay start of Pharmacological VTE agent (>24hrs) due to surgical blood loss or risk of bleeding: no  INDICATION FOR PROCEDURE: [name] [name] is a 80 y.o. male with enlarging infrarenal abdominal aortic aneurysm now measuring 55 mm.. After careful discussion of risks, benefits, and alternatives the patient was offered endovascular aortic aneurysm repair. The patient  understood and wished to proceed.  OPERATIVE FINDINGS: Successful endovascular aortic aneurysm repair.  No problems with percutaneous access or closure.  No evidence of type Ia, 1B, 3 endoleak.  There was a small type II endoleak at completion.  We will plan to monitor this.  DESCRIPTION OF PROCEDURE: After identification of the patient in the pre-operative holding area, the patient was transferred to the operating room. The patient was positioned supine on the operating room table. Anesthesia was induced. The abdomen, groins, and thighs were  prepped and draped in standard fashion. A surgical pause was performed confirming correct patient, procedure, and operative location.  Using ultrasound guidance the common femoral artery and its bifurcation were mapped on  the skin in the right groin.  Micropuncture access was obtained in the common femoral artery in an area free of calcific disease.  Micro sheath was introduced into the artery.  Through the sheath a Bentson wire was advanced into the aorta.  The micro sheath was removed and an 8 [place] dilator introduced into the artery.  A small incision was made in the skin using an 11 blade.  The tract over the access site was dilated with a hemostat.  Perclose devices were placed in the 10:00 and 2 o'clock position and secured with clamps for use at the end of the case.  Using ultrasound guidance the common femoral artery and its bifurcation were mapped on the skin in the left groin.  Micropuncture access was obtained in the common femoral artery and a area free of calcific disease.  Micro sheath was introduced into the artery.  Through the sheath a Bentson wire was advanced into the aorta.  The micro sheath was removed and an 8 [place] dilator introduced into the artery.  A small incision was made in the skin using an 11 blade.  The tract over the access site was dilated with a hemostat.  Perclose devices were placed in the 10:00 and 2 o'clock position and secured with clamps for use at the end of the case.  Access in both groins was upsized to 8 [place].  Using a Kumpe catheter Bentson wires were navigated into the ascending thoracic aorta from bilateral femoral access.  The wires were removed and exchanged for Amplatz wires.  Over stiff wires access was upsized again.  A 16 French dry seal device was used in the left.  A  14 French dry seal device was used on the right.  The patient was systemically heparinized.  Activated clotting time measurements were used throughout the case to confirm adequate anticoagulation.  Through the left femoral access, a 26 x 14 x 120 mm Gore conformable excluder device was advanced to the level of the takeoff of the left renal artery.  On the right, a pigtail catheter was advanced above the  level of the renal arteries.  An aortogram was performed.  The renal takeoff was marked bilaterally.  The conformable device was partially deployed and read constrained and repositioned to sit in the angulated neck just below the takeoff of the left (lower) renal artery.  A stiff wire was advanced through the pigtail on the right-hand side and left in position.  The pigtail was removed.  Using "buddy wire" technique a Kumpe catheter and Bentson wire were used to select the contralateral gate.  The gate was selected and a pigtail catheter exchanged for the Kumpe catheter advanced into the main body of the endograft.  The wire was withdrawn and the pigtail spun in the main body, confirming accurate selection of the gate.  An Amplatz wire was delivered through the pigtail catheter and the pigtail withdrawn.  A retrograde pelvic angiogram was performed through the sheath to mark the iliac bifurcation.  A 23 x 100 mm iliac extension limb was delivered through the right side access and deployed in standard fashion taking great care to avoid occluding the origin of the hypogastric artery.  We completed deployment of the main body through the left-sided access.  Retrograde iliac angiogram was performed.  A 14 x 70 mm iliac extension limb was advanced over the wire and used to extend our repair to the left iliac bifurcation.  A molding and occlusion balloon was used to angioplasty the proximal and distal seal zones, as well as the overlap areas.  Angiogram was performed.  No evidence of type Ia, 1B, or 3 endoleak was noted.  The renal arteries were preserved.  The hypogastric arteries were preserved.  There was a delayed type II endoleak from a lumbar artery.  We elected to observe this.  We left our wires in place and removed the right sided large bore sheath.  Manual pressure was held over the access point.  The previously placed Perclose devices were secured down.  Hemostasis was achieved.  The wire was removed.   The sutures were locked in place.  We left our wires in place and removed the left sided large bore sheath.  Manual pressure was held over the access point.  The previously placed Perclose devices were secured down.  Hemostasis was achieved.  The wire was removed.  The sutures were locked in place.  Doppler flow was confirmed in the feet bilaterally.  Heparin was reversed with protamine.  The Perclose devices were trimmed.  Simple Monocryl suture was used to close the access sites.  Upon completion of the case instrument and sharps counts were confirmed correct. The patient was transferred to the PACU in good condition. I was present for all portions of the procedure.  Yevonne Aline. Stanford Breed, MD [facility] Phone Number: [phone] 10/23/2021 12:38 PM

## 2021-10-23 NOTE — Anesthesia Preprocedure Evaluation (Addendum)
Anesthesia Evaluation  Patient identified by MRN, date of birth, ID band Patient awake    Reviewed: Allergy & Precautions, NPO status , Patient's Chart, lab work & pertinent test results  Airway Mallampati: II  TM Distance: >3 FB Neck ROM: Full    Dental  (+) Missing, Poor Dentition   Pulmonary neg pulmonary ROS, Current Smoker,    Pulmonary exam normal        Cardiovascular hypertension, Pt. on medications and Pt. on home beta blockers + CAD, + Past MI, + CABG (2004) and + Peripheral Vascular Disease (AAA)  + dysrhythmias  Rhythm:Regular Rate:Normal     Neuro/Psych negative neurological ROS  negative psych ROS   GI/Hepatic Neg liver ROS, GERD  Medicated,  Endo/Other  negative endocrine ROS  Renal/GU negative Renal ROS  negative genitourinary   Musculoskeletal  (+) Arthritis , Osteoarthritis,    Abdominal Normal abdominal exam  (+)   Peds  Hematology negative hematology ROS (+)   Anesthesia Other Findings   Reproductive/Obstetrics                            Anesthesia Physical Anesthesia Plan  ASA: 3  Anesthesia Plan: General   Post-op Pain Management:    Induction: Intravenous  PONV Risk Score and Plan: 1 and Ondansetron, Dexamethasone and Treatment may vary due to age or medical condition  Airway Management Planned: Mask and Oral ETT  Additional Equipment: Arterial line  Intra-op Plan:   Post-operative Plan: Extubation in OR  Informed Consent: I have reviewed the patients History and Physical, chart, labs and discussed the procedure including the risks, benefits and alternatives for the proposed anesthesia with the patient or authorized representative who has indicated his/her understanding and acceptance.     Dental advisory given  Plan Discussed with: CRNA  Anesthesia Plan Comments: (U/S 10/22: Abdominal Aorta: There is evidence of abnormal dilatation of the mid   Abdominal aorta. The largest aortic measurement is 5.6 cm. The largest  aortic diameter has increased compared to prior exam. Previous diameter  measurement was 5.1 cm obtained on  04/08/2021.    Lab Results      Component                Value               Date                      WBC                      9.4                 10/21/2021                HGB                      13.4                10/21/2021                HCT                      42.1                10/21/2021                MCV  98.1                10/21/2021                PLT                      211                 10/21/2021           Lab Results      Component                Value               Date                      NA                       139                 10/21/2021                K                        4.5                 10/21/2021                CO2                      26                  10/21/2021                GLUCOSE                  101 (H)             10/21/2021                BUN                      17                  10/21/2021                CREATININE               0.78                10/21/2021                CALCIUM                  9.0                 10/21/2021                EGFR                     87 (L)              06/04/2017                GFRNONAA                 >60                 10/21/2021          )  Anesthesia Quick Evaluation  

## 2021-10-23 NOTE — Anesthesia Procedure Notes (Signed)
Arterial Line Insertion Start/End11/16/2022 10:40 AM, 10/23/2021 10:45 AM Performed by: Darral Dash, DO, anesthesiologist  Patient location: Pre-op. Preanesthetic checklist: patient identified, IV checked, site marked, risks and benefits discussed, surgical consent, monitors and equipment checked, pre-op evaluation, timeout performed and anesthesia consent Lidocaine 1% used for infiltration Right, radial was placed Catheter size: 20 G Hand hygiene performed  and maximum sterile barriers used   Attempts: 2 Procedure performed using ultrasound guided technique. Ultrasound Notes:anatomy identified, needle tip was noted to be adjacent to the nerve/plexus identified and no ultrasound evidence of intravascular and/or intraneural injection Following insertion, dressing applied and Biopatch. Post procedure assessment: normal and unchanged  Post procedure complications: local hematoma and second provider assisted. Patient tolerated the procedure well with no immediate complications. Additional procedure comments: - initial attempts left radial unsuccessful with poorly patent vessel. Single attempt [place] right radial by self. Marland Kitchen

## 2021-10-23 NOTE — Transfer of Care (Signed)
Immediate Anesthesia Transfer of Care Note  Patient: [name] [name]  Procedure(s) Performed: ABDOMINAL AORTIC ENDOVASCULAR STENT GRAFT (Abdomen) ULTRASOUND GUIDANCE FOR VASCULAR ACCESS (Bilateral: Groin)  Patient Location: PACU  Anesthesia Type:General  Level of Consciousness: awake, alert  and patient cooperative  Airway & Oxygen Therapy: Patient Spontanous Breathing and Patient connected to face mask oxygen  Post-op Assessment: Report given to RN, Post -op Vital signs reviewed and stable and Patient moving all extremities X 4  Post vital signs: Reviewed and stable  Last Vitals:  Vitals Value Taken Time  BP 154/68   Temp    Pulse 69 10/23/21 1256  Resp 19 10/23/21 1256  SpO2 100 % 10/23/21 1256  Vitals shown include unvalidated device data.  Last Pain:  Vitals:   10/23/21 0918  TempSrc:   PainSc: 4       Patients Stated Pain Goal: 3 (64/84/72 0721)  Complications: No notable events documented.

## 2021-10-23 NOTE — Anesthesia Procedure Notes (Addendum)
Procedure Name: Intubation Date/Time: 10/23/2021 11:15 AM Performed by: Annamary Carolin, CRNA Pre-anesthesia Checklist: Patient identified, Emergency Drugs available, Suction available and Patient being monitored Patient Re-evaluated:Patient Re-evaluated prior to induction Oxygen Delivery Method: Circle System Utilized Preoxygenation: Pre-oxygenation with 100% oxygen Induction Type: IV induction Ventilation: Mask ventilation without difficulty Laryngoscope Size: Mac and 3 Grade View: Grade I Tube type: Oral Tube size: 8.0 mm Number of attempts: 1 Airway Equipment and Method: Stylet and Oral airway Placement Confirmation: ETT inserted through vocal cords under direct vision, positive ETCO2 and breath sounds checked- equal and bilateral Secured at: 23 cm Tube secured with: Tape Dental Injury: Teeth and Oropharynx as per pre-operative assessment  Comments: By Missy Sabins

## 2021-10-24 ENCOUNTER — Encounter (HOSPITAL_COMMUNITY): Payer: Self-pay | Admitting: Vascular Surgery

## 2021-10-24 DIAGNOSIS — Z95828 Presence of other vascular implants and grafts: Secondary | ICD-10-CM

## 2021-10-24 DIAGNOSIS — T82330A Leakage of aortic (bifurcation) graft (replacement), initial encounter: Secondary | ICD-10-CM

## 2021-10-24 LAB — BASIC METABOLIC PANEL
Anion gap: 6 (ref 5–15)
BUN: 12 mg/dL (ref 8–23)
CO2: 23 mmol/L (ref 22–32)
Calcium: 8.8 mg/dL — ABNORMAL LOW (ref 8.9–10.3)
Chloride: 109 mmol/L (ref 98–111)
Creatinine, Ser: 0.88 mg/dL (ref 0.61–1.24)
GFR, Estimated: >60 mL/min (ref >60)
Glucose, Bld: 116 mg/dL — ABNORMAL HIGH (ref 70–99)
Potassium: 3.9 mmol/L (ref 3.5–5.1)
Sodium: 138 mmol/L (ref 135–145)

## 2021-10-24 LAB — CBC
HCT: 35.6 % — ABNORMAL LOW (ref 39.0–52.0)
Hemoglobin: 11.8 g/dL — ABNORMAL LOW (ref 13.0–17.0)
MCH: 31.6 pg (ref 26.0–34.0)
MCHC: 33.1 g/dL (ref 30.0–36.0)
MCV: 95.2 fL (ref 80.0–100.0)
Platelets: 186 10*3/uL (ref 150–400)
RBC: 3.74 MIL/uL — ABNORMAL LOW (ref 4.22–5.81)
RDW: 13.4 % (ref 11.5–15.5)
WBC: 16.5 10*3/uL — ABNORMAL HIGH (ref 4.0–10.5)
nRBC: 0 % (ref 0.0–0.2)

## 2021-10-24 LAB — ABO/RH: ABO/RH(D): A NEG

## 2021-10-24 NOTE — Discharge Summary (Signed)
EVAR Discharge Summary   [name] [name] Feb 10, 1941 80 y.o. male  MRN: [id number]  Admission Date: 10/23/2021  Discharge Date: 10/24/2021  Dr. Jamelle Haring  Admission Diagnosis: AAA (abdominal aortic aneurysm) [I71.40]  Discharge Day services:       Hospital Course:  The patient was admitted to the hospital and taken to the operating room on 10/23/2021 and underwent:  endovascular aortic aneurysm repair (CPT [id number])      The pt tolerated the procedure well and was transported to the PACU in excellent condition.   He remained hemodynamically stable overnight.  On postoperative day 1, his Foley was removed and he was voiding spontaneously.  He was ambulating in the hallway without near syncope.  His groin puncture sites were without bleeding or hematoma.  He was tolerating his diet.  Hemoglobin and serum creatinine stable.  He was ready for discharge home in satisfactory condition.  The remainder of the hospital course consisted of increasing mobilization and increasing intake of solids without difficulty.  CBC    Component Value Date/Time   WBC 16.5 (H) 10/24/2021 0821   RBC 3.74 (L) 10/24/2021 0821   HGB 11.8 (L) 10/24/2021 0821   HGB 13.7 05/29/2021 1006   HGB 13.8 06/04/2017 1031   HCT 35.6 (L) 10/24/2021 0821   HCT 41.4 06/04/2017 1031   PLT 186 10/24/2021 0821   PLT 196 05/29/2021 1006   PLT 180 06/04/2017 1031   MCV 95.2 10/24/2021 0821   MCV 92.9 06/04/2017 1031   MCH 31.6 10/24/2021 0821   MCHC 33.1 10/24/2021 0821   RDW 13.4 10/24/2021 0821   RDW 14.0 06/04/2017 1031   LYMPHSABS 2.1 05/29/2021 1006   LYMPHSABS 2.2 06/04/2017 1031   MONOABS 0.7 05/29/2021 1006   MONOABS 0.6 06/04/2017 1031   EOSABS 0.7 (H) 05/29/2021 1006   EOSABS 0.6 (H) 06/04/2017 1031   BASOSABS 0.1 05/29/2021 1006   BASOSABS 0.1 06/04/2017 1031    BMET    Component Value Date/Time   NA 138 10/24/2021 0821   NA 139 06/04/2017 1031   K 3.9 10/24/2021 0821   K 5.1  06/04/2017 1031   CL 109 10/24/2021 0821   CL 108 (H) 05/23/2013 1514   CO2 23 10/24/2021 0821   CO2 26 06/04/2017 1031   GLUCOSE 116 (H) 10/24/2021 0821   GLUCOSE 101 06/04/2017 1031   GLUCOSE 85 05/23/2013 1514   BUN 12 10/24/2021 0821   BUN 12.0 06/04/2017 1031   CREATININE 0.88 10/24/2021 0821   CREATININE 0.85 06/05/2020 1039   CREATININE 0.8 06/04/2017 1031   CALCIUM 8.8 (L) 10/24/2021 0821   CALCIUM 9.5 06/04/2017 1031   GFRNONAA >60 10/24/2021 0821   GFRNONAA >60 06/05/2020 1039   GFRAA >60 06/05/2020 1039       Discharge Instructions     Discharge patient   Complete by: As directed    Discharge disposition: 01-Home or Self Care   Discharge patient date: 10/24/2021       Discharge Diagnosis:  AAA (abdominal aortic aneurysm) [I71.40]  Secondary Diagnosis: Patient Active Problem List   Diagnosis Date Noted   AAA (abdominal aortic aneurysm) 10/23/2021   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   Cough 04/01/2021   Rash 11/14/2019   History of colonic polyps    Benign neoplasm of cecum    Benign neoplasm of ascending colon    Benign neoplasm of transverse colon    Benign neoplasm of sigmoid colon    Spinal stenosis 09/04/2016  AAA (abdominal aortic aneurysm) without rupture 06/23/2016   BPH (benign prostatic hypertrophy) with urinary retention 01/20/2015   Carotid artery disease (HCC)    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 duplex 2015 - 1-39%  bilaterally.   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 Ralene Ok     Allergies as of 10/24/2021       Reactions   Penicillins Other (See Comments)   PATIENT HAS HAD A PCN REACTION WITH IMMEDIATE RASH, FACIAL/TONGUE/THROAT SWELLING, SOB, OR LIGHTHEADEDNESS WITH HYPOTENSION:   #  YES  #    Has patient had a PCN reaction causing severe rash involving mucus membranes or skin necrosis: No Has patient had a PCN reaction that required hospitalization No Has patient had a PCN reaction occurring within the last 10 years: No If all of the above answers are "NO", then may proceed with Cephalosporin use. Passed out        Medication List     TAKE these medications    aspirin EC 81 MG tablet Take 81 mg by mouth in the morning. Swallow whole.   betamethasone valerate ointment 0.1 % Commonly known as: VALISONE Apply 1 application topically 2 (two) times daily. What changed:  when to take this reasons to take this   carvedilol 12.5 MG tablet Commonly known as: COREG Take 1 tablet (12.5 mg total) by mouth 2 (two) times daily with a meal.   esomeprazole 20 MG capsule Commonly known as: NEXIUM Take 20 mg by mouth in the morning.   HYDROcodone-acetaminophen 10-325 MG tablet Commonly known as: NORCO Take 1 tablet by mouth daily as needed for pain.   ipratropium 0.03 % nasal spray Commonly known as: ATROVENT SPRAY 2 SPRAYS INTO EACH NOSTRIL EVERY 12 HOURS What changed: See the new instructions.   lisinopril 40 MG tablet Commonly known  as: ZESTRIL Take 1 tablet (40 mg total) by mouth daily.   montelukast 10 MG tablet Commonly known as: SINGULAIR TAKE 1 TABLET BY MOUTH EVERYDAY AT BEDTIME   multivitamin with minerals Tabs tablet Take 1 tablet by mouth in the morning. Centrum for Men   nitroGLYCERIN 0.4 MG SL tablet Commonly known as: NITROSTAT Place 1 tablet (0.4 mg total) under the tongue every 5 (five) minutes as needed for chest pain.   rosuvastatin 20 MG tablet  Commonly known as: CRESTOR Take 1 tablet (20 mg total) by mouth daily.   senna-docusate 8.6-50 MG tablet Commonly known as: Senokot-S Take 3 tablets by mouth daily in the afternoon.   tamsulosin 0.4 MG Caps capsule Commonly known as: FLOMAX Take 2 capsules (0.8 mg total) by mouth daily. What changed: when to take this   traMADol 50 MG tablet Commonly known as: ULTRAM Take 50 mg by mouth every 4 (four) hours as needed for moderate pain.        Discharge Instructions:   [facility]  Discharge Instructions Endovascular Aortic Aneurysm Repair  Please refer to the following instructions for your post-procedure care. Your surgeon or Physician Assistant will discuss any changes with you.  Activity  You are encouraged to walk as much as you can. You can slowly return to normal activities but must avoid strenuous activity and heavy lifting until your doctor tells you it's OK. Avoid activities such as vacuuming or swinging a gold club. It is normal to feel tired for several weeks after your surgery. Do not drive until your doctor gives the OK and you are no longer taking prescription pain medications. It is also normal to have difficulty with sleep habits, eating, and bowel movements after surgery. These will go away with time.  Bathing/Showering  You may shower after you go home. If you have an incision, do not soak in a bathtub, hot tub, or swim until the incision heals completely.  Incision Care  Shower every  day. Clean your incision with mild soap and water. Pat the area dry with a clean towel. You do not need a bandage unless otherwise instructed. Do not apply any ointments or creams to your incision. If you clothing is irritating, you may cover your incision with a dry gauze pad.  Diet  Resume your normal diet. There are no special food restrictions following this procedure. A low fat/low cholesterol diet is recommended for all patients with vascular disease. In order to heal from your surgery, it is CRITICAL to get adequate nutrition. Your body requires vitamins, minerals, and protein. Vegetables are the best source of vitamins and minerals. Vegetables also provide the perfect balance of protein. Processed food has little nutritional value, so try to avoid this.  Medications  Resume taking all of your medications unless your doctor or Physician Assistnat tells you not to. If your incision is causing pain, you may take over-the-counter pain relievers such as acetaminophen (Tylenol). If you were prescribed a stronger pain medication, please be aware these medications can cause nausea and constipation. Prevent nausea by taking the medication with a snack or meal. Avoid constipation by drinking plenty of fluids and eating foods with a high amount of fiber, such as fruits, vegetables, and grains. Do not take Tylenol if you are taking prescription pain medications.   Follow up  [place] office will schedule a follow-up appointment with a C.T. scan 3-4 weeks after your surgery.  Please call us immediately for any of the following conditions  Severe or worsening pain in your legs or feet or in your abdomen back or chest. Increased pain, redness, drainage (pus) from your incision sit. Increased abdominal pain, bloating, nausea, vomiting or persistent diarrhea. Fever of 101 degrees or higher. Swelling in your leg (s),  Reduce your risk of vascular disease  Stop smoking. If you would like help call  [facility] at 1-800-QUIT-NOW 223-235-2070) or [place] at [phone]. Manage your cholesterol Maintain a desired weight Control  your diabetes Keep your blood pressure down  If you have questions, please call the office at [phone].    Prescriptions given: Patient on tramadol 4 times a day for back pain.  No narcotic pain medication prescribed #0 No Refill  Disposition: Home  Patient's condition: is Excellent  Follow up: 1. Dr. Stanford Breed in 4 weeks with CTA protocol   Barbie Banner, PA-C [facility] [phone] 10/24/2021  12:17 PM   - For VQI Registry use - Post-op:  Time to Extubation: [x]  In OR, [ ]  < 12 hrs, [ ]  12-24 hrs, [ ]  >=24 hrs Vasopressors Req. Post-op: No MI: No., [ ]  Troponin only, [ ]  EKG or Clinical New Arrhythmia: No CHF: No ICU Stay: 0 day in  Transfusion: No     If yes, 0 units given  Complications: Resp failure: No., [ ]  Pneumonia, [ ]  Ventilator Chg in renal function: No., [ ]  Inc. Cr > 0.5, [ ]  Temp. Dialysis,  [ ]  Permanent dialysis Leg ischemia: No., no Surgery needed, [ ]  Yes, Surgery needed,  [ ]  Amputation Bowel ischemia: No., [ ]  Medical Rx, [ ]  Surgical Rx Wound complication: No., [ ]  Superficial separation/infection, [ ]  Return to OR Return to OR: No  Return to OR for bleeding: No Stroke: No., [ ]  Minor, [ ]  Major  Discharge medications: Statin use:  Yes  ASA use:  Yes  Plavix use:  No  Beta blocker use:  Yes  ARB use:  No ACEI use:  Yes CCB use:  No

## 2021-10-24 NOTE — Discharge Instructions (Signed)
   [facility] [place]   Discharge Instructions  Endovascular Aortic Aneurysm Repair  Please refer to the following instructions for your post-procedure care. Your surgeon or Physician Assistant will discuss any changes with you.  Activity  You are encouraged to walk as much as you can. You can slowly return to normal activities but must avoid strenuous activity and heavy lifting until your doctor tells you it's OK. Avoid activities such as vacuuming or swinging a gold club. It is normal to feel tired for several weeks after your surgery. Do not drive until your doctor gives the OK and you are no longer taking prescription pain medications. It is also normal to have difficulty with sleep habits, eating, and bowel movements after surgery. These will go away with time.  Bathing/Showering  You may shower after you go home. If you have an incision, do not soak in a bathtub, hot tub, or swim until the incision heals completely.  Incision Care  Shower every day. Clean your incision with mild soap and water. Pat the area dry with a clean towel. You do not need a bandage unless otherwise instructed. Do not apply any ointments or creams to your incision. If you clothing is irritating, you may cover your incision with a dry gauze pad.  Diet  Resume your normal diet. There are no special food restrictions following this procedure. A low fat/low cholesterol diet is recommended for all patients with vascular disease. In order to heal from your surgery, it is CRITICAL to get adequate nutrition. Your body requires vitamins, minerals, and protein. Vegetables are the best source of vitamins and minerals. Vegetables also provide the perfect balance of protein. Processed food has little nutritional value, so try to avoid this.  Medications  Resume taking all of your medications unless your doctor or nurse practitioner tells you not to. If your incision is causing pain, you may take  over-the-counter pain relievers such as acetaminophen (Tylenol). If you were prescribed a stronger pain medication, please be aware these medications can cause nausea and constipation. Prevent nausea by taking the medication with a snack or meal. Avoid constipation by drinking plenty of fluids and eating foods with a high amount of fiber, such as fruits, vegetables, and grains. Do not take Tylenol if you are taking prescription pain medications.   Follow up  Our office will schedule a follow-up appointment with a C.T. scan 3-4 weeks after your surgery.  Please call us immediately for any of the following conditions  Severe or worsening pain in your legs or feet or in your abdomen back or chest. Increased pain, redness, drainage (pus) from your incision sit. Increased abdominal pain, bloating, nausea, vomiting or persistent diarrhea. Fever of 101 degrees or higher. Swelling in your leg (s),  Reduce your risk of vascular disease  Stop smoking. If you would like help call [facility] at 1-800-QUIT-NOW (1-800-784-8669) or [place] at [phone]. Manage your cholesterol Maintain a desired weight Control your diabetes Keep your blood pressure down  If you have questions, please call the office at [phone].   

## 2021-10-24 NOTE — Progress Notes (Signed)
10/24/2021 10:49 AM Discharge AVS meds taken today and those due this evening reviewed.  Follow-up appointments and when to call md reviewed.  D/C IV and TELE.  Questions and concerns addressed.   D/C home per orders.  Carney [place]ers

## 2021-10-24 NOTE — Progress Notes (Signed)
10/24/2021 9:41 AM Pt ambulated with walker 120 ft on RA without any difficulty.  Sats remained 95-100%. Carney Corners

## 2021-10-24 NOTE — Progress Notes (Addendum)
Progress Note    10/24/2021 7:43 AM 1 Day Post-Op  Subjective:  No complaints   Vitals:   10/23/21 2312 10/24/21 0309  BP: (!) 104/53 (!) 96/56  Pulse: 70 65  Resp: 17 17  Temp: 97.7 F (36.5 C) 97.7 F (36.5 C)  SpO2: 97% 96%    Physical Exam: General appearance: Awake, alert in no apparent distress Cardiac: Heart rate and rhythm are regular Respirations: Nonlabored Incisions: Right and left groin puncture sites are all well approximated without bleeding or hematoma Extremities: Both feet are warm with intact sensation and motor function.   Pulse/Doppler exam: Palpable 2+ DP pulses bilaterally. Brisk dorsalis pedis, posterior tibial and peroneal artery Doppler signals    CBC    Component Value Date/Time   WBC 11.8 (H) 10/23/2021 1330   RBC 4.00 (L) 10/23/2021 1330   HGB 12.7 (L) 10/23/2021 1330   HGB 13.7 05/29/2021 1006   HGB 13.8 06/04/2017 1031   HCT 38.2 (L) 10/23/2021 1330   HCT 41.4 06/04/2017 1031   PLT 185 10/23/2021 1330   PLT 196 05/29/2021 1006   PLT 180 06/04/2017 1031   MCV 95.5 10/23/2021 1330   MCV 92.9 06/04/2017 1031   MCH 31.8 10/23/2021 1330   MCHC 33.2 10/23/2021 1330   RDW 13.3 10/23/2021 1330   RDW 14.0 06/04/2017 1031   LYMPHSABS 2.1 05/29/2021 1006   LYMPHSABS 2.2 06/04/2017 1031   MONOABS 0.7 05/29/2021 1006   MONOABS 0.6 06/04/2017 1031   EOSABS 0.7 (H) 05/29/2021 1006   EOSABS 0.6 (H) 06/04/2017 1031   BASOSABS 0.1 05/29/2021 1006   BASOSABS 0.1 06/04/2017 1031    BMET    Component Value Date/Time   NA 137 10/23/2021 1330   NA 139 06/04/2017 1031   K 4.0 10/23/2021 1330   K 5.1 06/04/2017 1031   CL 109 10/23/2021 1330   CL 108 (H) 05/23/2013 1514   CO2 22 10/23/2021 1330   CO2 26 06/04/2017 1031   GLUCOSE 121 (H) 10/23/2021 1330   GLUCOSE 101 06/04/2017 1031   GLUCOSE 85 05/23/2013 1514   BUN 9 10/23/2021 1330   BUN 12.0 06/04/2017 1031   CREATININE 0.62 10/23/2021 1330   CREATININE 0.85 06/05/2020 1039    CREATININE 0.8 06/04/2017 1031   CALCIUM 8.5 (L) 10/23/2021 1330   CALCIUM 9.5 06/04/2017 1031   GFRNONAA >60 10/23/2021 1330   GFRNONAA >60 06/05/2020 1039   GFRAA >60 06/05/2020 1039     Intake/Output Summary (Last 24 hours) at 10/24/2021 0743 Last data filed at 10/24/2021 0311 Gross per 24 hour  Intake 240 ml  Output 1515 ml  Net -1275 ml    HOSPITAL MEDICATIONS Scheduled Meds:  aspirin EC  81 mg Oral q AM   carvedilol  12.5 mg Oral BID WC   docusate sodium  100 mg Oral Daily   heparin  5,000 Units Subcutaneous Q8H   [START ON 10/25/2021] lisinopril  40 mg Oral Daily   pantoprazole  40 mg Oral Daily   rosuvastatin  20 mg Oral Daily   senna-docusate  3 tablet Oral Q1500   tamsulosin  0.8 mg Oral QHS   Continuous Infusions:  sodium chloride     sodium chloride 75 mL/hr at 10/23/21 1552   magnesium sulfate bolus IVPB     PRN Meds:.sodium chloride, acetaminophen **OR** acetaminophen, alum & mag hydroxide-simeth, guaiFENesin-dextromethorphan, hydrALAZINE, labetalol, magnesium sulfate bolus IVPB, metoprolol tartrate, morphine injection, nitroGLYCERIN, ondansetron, oxyCODONE-acetaminophen, phenol, potassium chloride  Assessment and Plan: POD 1  EVAR. Hemodynamically stable. No evidence of malperfusion. Foley just removed>>await spontaneous voiding. Hgb and Scr OK. Needs to be OOB and mobilize prior to DC home.   -DVT prophylaxis:  heparin [place]   Risa Grill, PA-C [facility] [phone] 10/24/2021  7:43 AM   VASCULAR STAFF ADDENDUM: I have independently interviewed and examined the patient. I agree with the above.  Looks great POD#1 after EVAR. Reviewed operative findings. 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 Will see again in 1 month with CT angiogram of abdomen and pelvis. OK for discharge.  Yevonne Aline. Stanford Breed, MD [facility] Phone Number: [phone] 10/24/2021 9:49  AM

## 2021-10-25 ENCOUNTER — Telehealth: Payer: Self-pay

## 2021-10-25 NOTE — Telephone Encounter (Signed)
Transition Care Management Follow-up Telephone Call Date of discharge and from where: 10/24/2021 from [facility] Discharge Diagnosis: AAA (abdominal aortic aneurysm) [I71.40] How have you been since you were released from the hospital? "Hurting right now" Any questions or concerns? No  Items Reviewed: Did the pt receive and understand the discharge instructions provided? Yes  Medications obtained and verified? Yes  Other? No  Any new allergies since your discharge? No  Dietary orders reviewed? No, normal diet Do you have support at home? Yes , wife   Home Care and Equipment/Supplies: Were home health services ordered? no If so, what is the name of the agency? N/a  Has the agency set up a time to come to the patient's home? no Were any new equipment or medical supplies ordered?  No What is the name of the medical supply agency? N/a Were you able to get the supplies/equipment? not applicable Do you have any questions related to the use of the equipment or supplies? No  Functional Questionnaire: (I = Independent and D = Dependent) ADLs: i  Bathing/Dressing- i  Meal Prep- i  Eating- i  Maintaining continence- i  Transferring/Ambulation- i  Managing Meds- I  Activity per discharge summary: You are encouraged to walk as much as you can. You can slowly return to normal activities but must avoid strenuous activity and heavy lifting until your doctor tells you it's OK. Avoid activities such as vacuuming or swinging a gold club. It is normal to feel tired for several weeks after your surgery. Do not drive until your doctor gives the OK and you are no longer taking prescription pain medications. It is also normal to have difficulty with sleep habits, eating, and bowel movements after surgery. These will go away with time.  Follow up appointments reviewed:  PCP Hospital f/u appt confirmed? Yes  Scheduled to see Pricilla Holm, MD on 10/30/2021 @ 10:20 am. [facility] f/u  appt confirmed? Yes  Scheduled to see Jamelle Haring, MD with [facility] Are transportation arrangements needed? No  If their condition worsens, is the pt aware to call PCP or go to the Emergency Dept.? Yes Was the patient provided with contact information for the PCP's office or ED? Yes Was to pt encouraged to call back with questions or concerns? Yes

## 2021-10-30 ENCOUNTER — Ambulatory Visit (INDEPENDENT_AMBULATORY_CARE_PROVIDER_SITE_OTHER): Payer: PPO | Admitting: Internal Medicine

## 2021-10-30 ENCOUNTER — Other Ambulatory Visit: Payer: Self-pay

## 2021-10-30 ENCOUNTER — Encounter: Payer: Self-pay | Admitting: Internal Medicine

## 2021-10-30 VITALS — BP 130/80 | HR 85 | Resp 18 | Ht 70.0 in | Wt 168.0 lb

## 2021-10-30 DIAGNOSIS — L0291 Cutaneous abscess, unspecified: Secondary | ICD-10-CM

## 2021-10-30 DIAGNOSIS — I7143 Infrarenal abdominal aortic aneurysm, without rupture: Secondary | ICD-10-CM | POA: Diagnosis not present

## 2021-10-30 DIAGNOSIS — K5903 Drug induced constipation: Secondary | ICD-10-CM | POA: Diagnosis not present

## 2021-10-30 DIAGNOSIS — M545 Low back pain, unspecified: Secondary | ICD-10-CM | POA: Diagnosis not present

## 2021-10-30 DIAGNOSIS — Z23 Encounter for immunization: Secondary | ICD-10-CM | POA: Diagnosis not present

## 2021-10-30 DIAGNOSIS — K5900 Constipation, unspecified: Secondary | ICD-10-CM | POA: Insufficient documentation

## 2021-10-30 MED ORDER — SULFAMETHOXAZOLE-TRIMETHOPRIM 800-160 MG PO TABS: 1.0000 | ORAL_TABLET | Freq: Two times a day (BID) | ORAL | 0 refills | Status: DC

## 2021-10-30 NOTE — Assessment & Plan Note (Signed)
He is having back pain since endovascular repair. No pulsatile mass or bruit on exam. He is still in window of repair and asked him to monitor closely. Constipation could also be a factor and asked him to use miralax daily until regulated and off opioids.

## 2021-10-30 NOTE — Patient Instructions (Addendum)
We have given you the flu shot today and think about getting the covid-19 booster at the pharmacy.  We have sent in bactrim to take 1 pill twice a day for 1 week for the infection.  Use the hibiclens (chlorhexidine) soap on the areas where you get the sores twice a week to prevent sores.   Try miralax daily to help with constipation for the next few weeks to help avoid straining.

## 2021-10-30 NOTE — Assessment & Plan Note (Signed)
Likely due to in part opioids after endovascular AAA repair. Advised to continue senokot daily and add miralax daily until going regularly and then prn. No signs of obstruction on exam today.

## 2021-10-30 NOTE — Progress Notes (Signed)
   Subjective:   Patient ID: [name] [name], male    DOB: [birth year], 80 y.o.   MRN: [id number]  HPI The patient is an 80 YO man coming in for hospital follow up and other concerns. He is having back pain since repair which is concerning to him and constipation.   PMH, [facility], social history reviewed and updated.   Hospital discharge summary reviewed and he was admitted for endovascular repair of AAA without complications during stay.   Review of Systems  Constitutional: Negative.   HENT: Negative.    Eyes: Negative.   Respiratory:  Negative for cough, chest tightness and shortness of breath.   Cardiovascular:  Negative for chest pain, palpitations and leg swelling.  Gastrointestinal:  Positive for constipation. Negative for abdominal distention, abdominal pain, diarrhea, nausea and vomiting.  Musculoskeletal:  Positive for back pain.  Skin:  Positive for wound.  Neurological: Negative.   Psychiatric/Behavioral: Negative.     Objective:  Physical Exam Constitutional:      Appearance: He is well-developed.  HENT:     Head: Normocephalic and atraumatic.  Cardiovascular:     Rate and Rhythm: Normal rate and regular rhythm.  Pulmonary:     Effort: Pulmonary effort is normal. No respiratory distress.     Breath sounds: Normal breath sounds. No wheezing or rales.  Abdominal:     General: Bowel sounds are normal. There is no distension.     Palpations: Abdomen is soft.     Tenderness: There is no abdominal tenderness. There is no rebound.     Comments: No bruit detected  Genitourinary:    Comments: Wound on the left side scrotum with 3 open sores with some firmness underneath surface and no to minimal redness to skin Musculoskeletal:        General: Tenderness present.     Cervical back: Normal range of motion.  Skin:    General: Skin is warm and dry.  Neurological:     Mental Status: He is alert and oriented to person, place, and time.     Coordination: Coordination normal.     Vitals:   10/30/21 1021  BP: 130/80  Pulse: 85  Resp: 18  SpO2: 98%  Weight: 168 lb (76.2 kg)  Height: 5\' 10"  (1.778 m)   This visit occurred during the SARS-CoV-2 public health emergency.  Safety protocols were in place, including screening questions prior to the visit, additional usage of staff PPE, and extensive cleaning of exam room while observing appropriate contact time as indicated for disinfecting solutions.   Assessment & Plan:  Flu shot given at visit

## 2021-10-30 NOTE — Assessment & Plan Note (Signed)
Rx bactrim 1 week for scrotal abscess forming. Advised to clean inguinal and axillary regions with hibiclens once healed 1-2 times per week to help lower risk of recurrence since this area is recurrent with abscesses.

## 2021-10-30 NOTE — Assessment & Plan Note (Signed)
S/P repair recently and is doing okay. He is having back pain which may be associated and he is still within window of this for some pain. Advised this should improve or resolve in the next 1 week and if not let us or vascular surgery know. He has scheduled follow up with them in 1 month. His labs were reviewed and normal during hospital stay and no repeat is needed today.

## 2021-11-04 ENCOUNTER — Other Ambulatory Visit: Payer: Self-pay

## 2021-11-04 DIAGNOSIS — I714 Abdominal aortic aneurysm, without rupture, unspecified: Secondary | ICD-10-CM

## 2021-11-05 ENCOUNTER — Encounter: Payer: Self-pay | Admitting: Internal Medicine

## 2021-11-05 MED ORDER — SULFAMETHOXAZOLE-TRIMETHOPRIM 800-160 MG PO TABS
1.0000 | ORAL_TABLET | Freq: Two times a day (BID) | ORAL | 0 refills | Status: AC
Start: 2021-11-05 — End: 2021-11-12

## 2021-11-14 ENCOUNTER — Other Ambulatory Visit: Payer: Self-pay

## 2021-11-14 ENCOUNTER — Ambulatory Visit (HOSPITAL_COMMUNITY)
Admission: RE | Admit: 2021-11-14 | Discharge: 2021-11-14 | Disposition: A | Discharge status: Home / Self Care | Payer: PPO | Source: Ambulatory Visit | Attending: Vascular Surgery | Admitting: Vascular Surgery

## 2021-11-14 DIAGNOSIS — I714 Abdominal aortic aneurysm, without rupture, unspecified: Secondary | ICD-10-CM | POA: Diagnosis not present

## 2021-11-14 DIAGNOSIS — I7143 Infrarenal abdominal aortic aneurysm, without rupture: Secondary | ICD-10-CM | POA: Diagnosis not present

## 2021-11-14 DIAGNOSIS — N281 Cyst of kidney, acquired: Secondary | ICD-10-CM | POA: Diagnosis not present

## 2021-11-14 MED ORDER — IOHEXOL 350 MG/ML SOLN
100.0000 mL | Freq: Once | INTRAVENOUS | Status: AC | PRN
  Administered 2021-11-14: 100 mL via INTRAVENOUS

## 2021-11-14 MED ORDER — SODIUM CHLORIDE (PF) 0.9 % IJ SOLN
INTRAMUSCULAR | Status: AC
  Filled 2021-11-14: qty 50

## 2021-11-24 ENCOUNTER — Other Ambulatory Visit: Payer: Self-pay | Admitting: Internal Medicine

## 2021-12-02 NOTE — Progress Notes (Signed)
[facility] [place] PROGRESS NOTE  ASSESSMENT / PLAN: [name] [name] is a 80 y.o. male s/p EVAR 10/23/21. Type II endoleak noted at completion angiogram has resolved on follow up CT angiogram. Ok to begin surveillance with yearly duplex. Follow up with [facility] PA in 1 year with EVAR duplex.    SUBJECTIVE: Doing well. No complaints.   OBJECTIVE: BP (!) 141/83 (BP Location: Left Arm, Patient Position: Sitting, Cuff Size: Large)    Pulse 90    Temp 99 F (37.2 C)    Resp 20    Ht 5\' 10"  (1.778 m)    Wt 168 lb (76.2 kg)    SpO2 98%    BMI 24.11 kg/m   No acute distress Regular rate and rhythm Unlabored breathing Non-distended abdomen  CBC Latest Ref Rng & Units 10/24/2021 10/23/2021 10/21/2021  WBC 4.0 - 10.5 K/uL 16.5(H) 11.8(H) 9.4  Hemoglobin 13.0 - 17.0 g/dL 11.8(L) 12.7(L) 13.4  Hematocrit 39.0 - 52.0 % 35.6(L) 38.2(L) 42.1  Platelets 150 - 400 K/uL 186 185 211     CMP Latest Ref Rng & Units 10/24/2021 10/23/2021 10/21/2021  Glucose 70 - 99 mg/dL 116(H) 121(H) 101(H)  BUN 8 - 23 mg/dL 12 9 17   Creatinine 0.61 - 1.24 mg/dL 0.88 0.62 0.78  Sodium 135 - 145 mmol/L 138 137 139  Potassium 3.5 - 5.1 mmol/L 3.9 4.0 4.5  Chloride 98 - 111 mmol/L 109 109 107  CO2 22 - 32 mmol/L 23 22 26   Calcium 8.9 - 10.3 mg/dL 8.8(L) 8.5(L) 9.0  Total Protein 6.5 - 8.1 g/dL - - 6.0(L)  Total Bilirubin 0.3 - 1.2 mg/dL - - 0.5  Alkaline Phos 38 - 126 U/L - - 64  AST 15 - 41 U/L - - 21  ALT 0 - 44 U/L - - 16   Postop CT angiogram reviewed in detail.  Good technical result from EVAR.  No evidence of type 1A, 1B, 2, or 3 endoleak.   Yevonne Aline. Stanford Breed, MD [facility] Phone Number: [phone] 12/03/2021 2:09 PM

## 2021-12-03 ENCOUNTER — Ambulatory Visit (INDEPENDENT_AMBULATORY_CARE_PROVIDER_SITE_OTHER): Payer: PPO | Admitting: Vascular Surgery

## 2021-12-03 ENCOUNTER — Encounter: Payer: Self-pay | Admitting: Vascular Surgery

## 2021-12-03 ENCOUNTER — Other Ambulatory Visit: Payer: Self-pay

## 2021-12-03 VITALS — BP 141/83 | HR 90 | Temp 99.0°F | Resp 20 | Ht 70.0 in | Wt 168.0 lb

## 2021-12-03 DIAGNOSIS — Z8679 Personal history of other diseases of the circulatory system: Secondary | ICD-10-CM

## 2021-12-03 DIAGNOSIS — Z9889 Other specified postprocedural states: Secondary | ICD-10-CM

## 2021-12-26 DIAGNOSIS — H61303 Acquired stenosis of external ear canal, unspecified, bilateral: Secondary | ICD-10-CM | POA: Diagnosis not present

## 2021-12-26 DIAGNOSIS — H903 Sensorineural hearing loss, bilateral: Secondary | ICD-10-CM | POA: Diagnosis not present

## 2021-12-26 DIAGNOSIS — F17290 Nicotine dependence, other tobacco product, uncomplicated: Secondary | ICD-10-CM | POA: Diagnosis not present

## 2021-12-26 DIAGNOSIS — Z974 Presence of external hearing-aid: Secondary | ICD-10-CM | POA: Diagnosis not present

## 2021-12-26 DIAGNOSIS — H61893 Other specified disorders of external ear, bilateral: Secondary | ICD-10-CM | POA: Diagnosis not present

## 2021-12-26 DIAGNOSIS — H6121 Impacted cerumen, right ear: Secondary | ICD-10-CM | POA: Diagnosis not present

## 2021-12-26 DIAGNOSIS — F17210 Nicotine dependence, cigarettes, uncomplicated: Secondary | ICD-10-CM | POA: Diagnosis not present

## 2022-01-02 ENCOUNTER — Encounter: Payer: Self-pay | Admitting: Internal Medicine

## 2022-01-03 ENCOUNTER — Ambulatory Visit (INDEPENDENT_AMBULATORY_CARE_PROVIDER_SITE_OTHER): Payer: PPO | Admitting: Nurse Practitioner

## 2022-01-03 ENCOUNTER — Other Ambulatory Visit: Payer: Self-pay

## 2022-01-03 ENCOUNTER — Encounter: Payer: Self-pay | Admitting: Nurse Practitioner

## 2022-01-03 VITALS — BP 100/58 | HR 96 | Temp 98.2°F | Ht 70.0 in

## 2022-01-03 DIAGNOSIS — K5900 Constipation, unspecified: Secondary | ICD-10-CM | POA: Diagnosis not present

## 2022-01-03 NOTE — Progress Notes (Signed)
Subjective:  Patient ID: [name] [name], male    DOB: May 04, 1941  Age: 81 y.o. MRN: [id number]  CC:  Chief Complaint  Patient presents with   Abdominal Pain    Throbbing pain, straining,       HPI  This patient arrives today for the above.  He has been experiencing constipation for the last 3 weeks.  He tells me he has been having bowel movements but they have been hard and difficult to pass.  He also tells me the volume of his bowel movements have reduced.  He has been taking MiraLAX every day as well as 3 stool softeners (Senokot) at bedtime daily.  He is on chronic opioid management for back pain.  He tells me his last colonoscopy was a while ago he did have "15 polyps" which were all noncancerous.  He denies seeing any blood in his stool.  He denies any unintentional weight loss.  He tells me he took milk of magnesia last night and this has resulted in him having a few large loose stools today.  He tells me the pain in his stomach has completely resolved with being able to have a bowel movement. Past Medical History:  Diagnosis Date   AAA (abdominal aortic aneurysm)    Back pain    decompression belt bought off of TV   CAD (coronary artery disease)    Carotid artery disease ([place])    a. duplex 2015 - 1-39% bilaterally.   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 Ralene Ok      Family History  Problem Relation Age of Onset   Kidney disease Mother    COPD Mother    Stroke Father        cerebral hemorrhage   Heart disease  Brother    Heart disease Brother    Arthritis Brother    Cancer Paternal Grandfather     Social History   Social History Narrative   HSG, Married '62, 1 son-'70, 1 dtr - '68; 2 grandchildren. Work - Probation officer.   ACP - DNR, DNI, no artificial hydration or feeding, no heroic measures   Social History   Tobacco Use   Smoking status: Every Day    Packs/day: 0.50    Years: 65.00    Pack years: 32.50    Types: Cigarettes   Smokeless tobacco: Never   Tobacco comments:    smoking since age 66 yrs old  Substance Use Topics   Alcohol use: Not Currently     Current Meds  Medication Sig   aspirin EC 81 MG tablet Take 81 mg by mouth in the morning. Swallow whole.   betamethasone valerate ointment (VALISONE) 0.1 % Apply 1 application topically 2 (two) times daily. (Patient taking differently: Apply 1 application topically 2 (two) times daily as needed (skin irritation/rash).)   carvedilol (COREG) 12.5 MG tablet Take 1 tablet (12.5 mg total) by  mouth 2 (two) times daily with a meal.   esomeprazole (NEXIUM) 20 MG capsule Take 20 mg by mouth in the morning.   HYDROcodone-acetaminophen (NORCO) 10-325 MG tablet Take 1 tablet by mouth daily as needed for pain.   ipratropium (ATROVENT) 0.03 % nasal spray SPRAY 2 SPRAYS INTO EACH NOSTRIL EVERY 12 HOURS (Patient taking differently: Place 2 sprays into both nostrils 2 (two) times daily as needed for rhinitis.)   lisinopril (ZESTRIL) 40 MG tablet Take 1 tablet (40 mg total) by mouth daily.   montelukast (SINGULAIR) 10 MG tablet TAKE 1 TABLET BY MOUTH EVERYDAY AT BEDTIME   Multiple Vitamin (MULTIVITAMIN WITH MINERALS) TABS tablet Take 1 tablet by mouth in the morning. Centrum for Men   nitroGLYCERIN (NITROSTAT) 0.4 MG SL tablet Place 1 tablet (0.4 mg total) under the tongue every 5 (five) minutes as needed for chest pain.   polyethylene glycol (MIRALAX / GLYCOLAX) 17 g packet Take 17 g by mouth daily.   rosuvastatin (CRESTOR) 20 MG  tablet Take 1 tablet (20 mg total) by mouth daily.   senna-docusate (SENOKOT-S) 8.6-50 MG tablet Take 2 tablets by mouth in the morning and at bedtime.   tamsulosin (FLOMAX) 0.4 MG CAPS capsule Take 2 capsules (0.8 mg total) by mouth daily. (Patient taking differently: Take 0.8 mg by mouth at bedtime.)   traMADol (ULTRAM) 50 MG tablet Take 50 mg by mouth every 4 (four) hours as needed for moderate pain.     ROS:  Review of Systems  Constitutional:  Negative for fever and weight loss.  Gastrointestinal:  Positive for constipation and diarrhea. Negative for abdominal pain, blood in stool, nausea and vomiting.    Objective:   Today's Vitals: BP (!) 100/58 (BP Location: Left Arm, Patient Position: Sitting, Cuff Size: Normal)    Pulse 96    Temp 98.2 F (36.8 C) (Oral)    Ht 5\' 10"  (1.778 m)    SpO2 96%    BMI 24.11 kg/m  Vitals with BMI 01/03/2022 12/03/2021 10/30/2021  Height 5\' 10"  5\' 10"  5\' 10"   Weight - 168 lbs 168 lbs  BMI - 94.76 54.65  Systolic 035 465 681  Diastolic 58 83 80  Pulse 96 90 85     Physical Exam Vitals reviewed.  Constitutional:      Appearance: Normal appearance.  HENT:     Head: Normocephalic and atraumatic.  Cardiovascular:     Rate and Rhythm: Normal rate and regular rhythm.  Pulmonary:     Effort: Pulmonary effort is normal.     Breath sounds: Normal breath sounds.  Abdominal:     General: Abdomen is flat. Bowel sounds are normal.     Palpations: Abdomen is soft.     Tenderness: There is no abdominal tenderness.  Musculoskeletal:     Cervical back: Neck supple.  Skin:    General: Skin is warm and dry.  Neurological:     Mental Status: He is alert and oriented to person, place, and time.  Psychiatric:        Mood and Affect: Mood normal.        Behavior: Behavior normal.        Thought Content: Thought content normal.        Judgment: Judgment normal.         Assessment and Plan   1. Constipation, unspecified constipation type       Plan: 1.  His abdominal pain and constipation seem to have resolved with use of  milk of magnesia currently.  I recommended he change his Senokot dosing to 2 tablets in the morning and 2 tablets in the evening.  I also encouraged him continue taking MiraLAX and only milk of magnesia as needed.  Will refer to gastroenterology to make sure that there is no other pathology involved with his constipation due to his history of multiple polyps.   Tests ordered Orders Placed This Encounter  Procedures   Ambulatory referral to Gastroenterology      No orders of the defined types were placed in this encounter.   Patient to follow-up in with Dr. Sharlet Salina in the next 3 to 4 months for routine visit.  Ailene Ards, NP

## 2022-01-06 ENCOUNTER — Emergency Department (HOSPITAL_COMMUNITY): Payer: PPO

## 2022-01-06 ENCOUNTER — Encounter (HOSPITAL_COMMUNITY): Payer: Self-pay

## 2022-01-06 ENCOUNTER — Inpatient Hospital Stay (HOSPITAL_COMMUNITY)
Admission: EM | Admit: 2022-01-06 | Discharge: 2022-01-11 | DRG: 194 | Disposition: A | Discharge status: Skilled Nursing Facility | Payer: PPO | Attending: Family Medicine | Admitting: Family Medicine

## 2022-01-06 ENCOUNTER — Encounter: Payer: Self-pay | Admitting: Gastroenterology

## 2022-01-06 DIAGNOSIS — I10 Essential (primary) hypertension: Secondary | ICD-10-CM | POA: Diagnosis present

## 2022-01-06 DIAGNOSIS — I252 Old myocardial infarction: Secondary | ICD-10-CM | POA: Diagnosis not present

## 2022-01-06 DIAGNOSIS — W19XXXD Unspecified fall, subsequent encounter: Secondary | ICD-10-CM | POA: Diagnosis not present

## 2022-01-06 DIAGNOSIS — L89151 Pressure ulcer of sacral region, stage 1: Secondary | ICD-10-CM | POA: Diagnosis present

## 2022-01-06 DIAGNOSIS — J189 Pneumonia, unspecified organism: Principal | ICD-10-CM | POA: Diagnosis present

## 2022-01-06 DIAGNOSIS — Z79899 Other long term (current) drug therapy: Secondary | ICD-10-CM

## 2022-01-06 DIAGNOSIS — I2510 Atherosclerotic heart disease of native coronary artery without angina pectoris: Secondary | ICD-10-CM | POA: Diagnosis not present

## 2022-01-06 DIAGNOSIS — R748 Abnormal levels of other serum enzymes: Secondary | ICD-10-CM | POA: Diagnosis not present

## 2022-01-06 DIAGNOSIS — D472 Monoclonal gammopathy: Secondary | ICD-10-CM | POA: Diagnosis not present

## 2022-01-06 DIAGNOSIS — S3992XA Unspecified injury of lower back, initial encounter: Secondary | ICD-10-CM | POA: Diagnosis not present

## 2022-01-06 DIAGNOSIS — E785 Hyperlipidemia, unspecified: Secondary | ICD-10-CM | POA: Diagnosis not present

## 2022-01-06 DIAGNOSIS — M48061 Spinal stenosis, lumbar region without neurogenic claudication: Secondary | ICD-10-CM | POA: Diagnosis not present

## 2022-01-06 DIAGNOSIS — Z8249 Family history of ischemic heart disease and other diseases of the circulatory system: Secondary | ICD-10-CM

## 2022-01-06 DIAGNOSIS — M545 Low back pain, unspecified: Secondary | ICD-10-CM

## 2022-01-06 DIAGNOSIS — G8929 Other chronic pain: Secondary | ICD-10-CM | POA: Diagnosis present

## 2022-01-06 DIAGNOSIS — I779 Disorder of arteries and arterioles, unspecified: Secondary | ICD-10-CM | POA: Diagnosis present

## 2022-01-06 DIAGNOSIS — M549 Dorsalgia, unspecified: Secondary | ICD-10-CM | POA: Diagnosis present

## 2022-01-06 DIAGNOSIS — Z96641 Presence of right artificial hip joint: Secondary | ICD-10-CM | POA: Diagnosis not present

## 2022-01-06 DIAGNOSIS — R1310 Dysphagia, unspecified: Secondary | ICD-10-CM | POA: Diagnosis present

## 2022-01-06 DIAGNOSIS — Z880 Allergy status to penicillin: Secondary | ICD-10-CM

## 2022-01-06 DIAGNOSIS — M8588 Other specified disorders of bone density and structure, other site: Secondary | ICD-10-CM | POA: Diagnosis present

## 2022-01-06 DIAGNOSIS — Z6823 Body mass index (BMI) 23.0-23.9, adult: Secondary | ICD-10-CM

## 2022-01-06 DIAGNOSIS — W19XXXA Unspecified fall, initial encounter: Secondary | ICD-10-CM | POA: Diagnosis not present

## 2022-01-06 DIAGNOSIS — M5116 Intervertebral disc disorders with radiculopathy, lumbar region: Secondary | ICD-10-CM | POA: Diagnosis not present

## 2022-01-06 DIAGNOSIS — Z951 Presence of aortocoronary bypass graft: Secondary | ICD-10-CM

## 2022-01-06 DIAGNOSIS — K219 Gastro-esophageal reflux disease without esophagitis: Secondary | ICD-10-CM | POA: Diagnosis not present

## 2022-01-06 DIAGNOSIS — M5136 Other intervertebral disc degeneration, lumbar region: Secondary | ICD-10-CM | POA: Diagnosis not present

## 2022-01-06 DIAGNOSIS — Z20822 Contact with and (suspected) exposure to covid-19: Secondary | ICD-10-CM | POA: Diagnosis not present

## 2022-01-06 DIAGNOSIS — L8990 Pressure ulcer of unspecified site, unspecified stage: Secondary | ICD-10-CM | POA: Insufficient documentation

## 2022-01-06 DIAGNOSIS — E441 Mild protein-calorie malnutrition: Secondary | ICD-10-CM | POA: Diagnosis not present

## 2022-01-06 DIAGNOSIS — R7401 Elevation of levels of liver transaminase levels: Secondary | ICD-10-CM | POA: Diagnosis not present

## 2022-01-06 DIAGNOSIS — F17210 Nicotine dependence, cigarettes, uncomplicated: Secondary | ICD-10-CM | POA: Diagnosis not present

## 2022-01-06 DIAGNOSIS — F32A Depression, unspecified: Secondary | ICD-10-CM | POA: Diagnosis not present

## 2022-01-06 DIAGNOSIS — I714 Abdominal aortic aneurysm, without rupture, unspecified: Secondary | ICD-10-CM | POA: Diagnosis not present

## 2022-01-06 DIAGNOSIS — Z7401 Bed confinement status: Secondary | ICD-10-CM | POA: Diagnosis not present

## 2022-01-06 DIAGNOSIS — R1311 Dysphagia, oral phase: Secondary | ICD-10-CM | POA: Diagnosis not present

## 2022-01-06 DIAGNOSIS — R509 Fever, unspecified: Secondary | ICD-10-CM

## 2022-01-06 DIAGNOSIS — Z7982 Long term (current) use of aspirin: Secondary | ICD-10-CM

## 2022-01-06 DIAGNOSIS — E440 Moderate protein-calorie malnutrition: Secondary | ICD-10-CM | POA: Diagnosis present

## 2022-01-06 DIAGNOSIS — R1313 Dysphagia, pharyngeal phase: Secondary | ICD-10-CM | POA: Diagnosis not present

## 2022-01-06 DIAGNOSIS — N400 Enlarged prostate without lower urinary tract symptoms: Secondary | ICD-10-CM | POA: Diagnosis present

## 2022-01-06 DIAGNOSIS — R1319 Other dysphagia: Secondary | ICD-10-CM | POA: Diagnosis not present

## 2022-01-06 DIAGNOSIS — M5459 Other low back pain: Secondary | ICD-10-CM | POA: Diagnosis not present

## 2022-01-06 DIAGNOSIS — K824 Cholesterolosis of gallbladder: Secondary | ICD-10-CM | POA: Diagnosis not present

## 2022-01-06 DIAGNOSIS — R531 Weakness: Secondary | ICD-10-CM | POA: Diagnosis not present

## 2022-01-06 DIAGNOSIS — M2578 Osteophyte, vertebrae: Secondary | ICD-10-CM | POA: Diagnosis not present

## 2022-01-06 DIAGNOSIS — M4800 Spinal stenosis, site unspecified: Secondary | ICD-10-CM | POA: Diagnosis not present

## 2022-01-06 LAB — BASIC METABOLIC PANEL
Anion gap: 8 (ref 5–15)
BUN: 23 mg/dL (ref 8–23)
CO2: 25 mmol/L (ref 22–32)
Calcium: 8.7 mg/dL — ABNORMAL LOW (ref 8.9–10.3)
Chloride: 99 mmol/L (ref 98–111)
Creatinine, Ser: 0.91 mg/dL (ref 0.61–1.24)
GFR, Estimated: >60 mL/min (ref >60)
Glucose, Bld: 127 mg/dL — ABNORMAL HIGH (ref 70–99)
Potassium: 4.5 mmol/L (ref 3.5–5.1)
Sodium: 132 mmol/L — ABNORMAL LOW (ref 135–145)

## 2022-01-06 LAB — CBC WITH DIFFERENTIAL/PLATELET
Abs Immature Granulocytes: 0.08 10*3/uL — ABNORMAL HIGH (ref 0.00–0.07)
Basophils Absolute: 0 10*3/uL (ref 0.0–0.1)
Basophils Relative: 0 %
Eosinophils Absolute: 0 10*3/uL (ref 0.0–0.5)
Eosinophils Relative: 0 %
HCT: 34.3 % — ABNORMAL LOW (ref 39.0–52.0)
Hemoglobin: 11.5 g/dL — ABNORMAL LOW (ref 13.0–17.0)
Immature Granulocytes: 1 %
Lymphocytes Relative: 7 %
Lymphs Abs: 0.8 10*3/uL (ref 0.7–4.0)
MCH: 31.8 pg (ref 26.0–34.0)
MCHC: 33.5 g/dL (ref 30.0–36.0)
MCV: 94.8 fL (ref 80.0–100.0)
Monocytes Absolute: 0.9 10*3/uL (ref 0.1–1.0)
Monocytes Relative: 7 %
Neutro Abs: 11.1 10*3/uL — ABNORMAL HIGH (ref 1.7–7.7)
Neutrophils Relative %: 85 %
Platelets: 182 10*3/uL (ref 150–400)
RBC: 3.62 MIL/uL — ABNORMAL LOW (ref 4.22–5.81)
RDW: 13.6 % (ref 11.5–15.5)
WBC: 12.9 10*3/uL — ABNORMAL HIGH (ref 4.0–10.5)
nRBC: 0 % (ref 0.0–0.2)

## 2022-01-06 LAB — URINALYSIS, ROUTINE W REFLEX MICROSCOPIC
Bilirubin Urine: NEGATIVE
Glucose, UA: NEGATIVE mg/dL
Ketones, ur: 5 mg/dL — AB
Leukocytes,Ua: NEGATIVE
Nitrite: NEGATIVE
Protein, ur: 100 mg/dL — AB
Specific Gravity, Urine: 1.027 (ref 1.005–1.030)
pH: 5 (ref 5.0–8.0)

## 2022-01-06 LAB — RESP PANEL BY RT-PCR (FLU A&B, COVID) ARPGX2
Influenza A by PCR: NEGATIVE
Influenza B by PCR: NEGATIVE
SARS Coronavirus 2 by RT PCR: NEGATIVE

## 2022-01-06 MED ORDER — HYDROCODONE-ACETAMINOPHEN 5-325 MG PO TABS
1.0000 | ORAL_TABLET | Freq: Once | ORAL | Status: AC
  Administered 2022-01-06: 1 via ORAL
  Filled 2022-01-06: qty 1

## 2022-01-06 NOTE — ED Provider Notes (Signed)
Fall 2 days ago and now having lower back pain. Had 3 episodes of incontinence of urine.  Able to ambulate. Does have elevated WBC and low grade temp.  Will check CXR and viral resp panel.    Awaits MRI of lspine.   10:59 PM MRI tech notified that patient has a spinal stimulator that is not compatible for MRI.  I discussed this with Dr. Matilde Sprang.  Plan to obtain CT scan of L-spine since patient did fell.  11:27 PM Bladder scan performed demonstrating 400 cc of retained urine.  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  Plan to obtain CT scan of L-spine for further assessment.  12:29 AM CT scan of the L-spine obtained, independently reviewed interpreted by me showing without any acute finding.  Since patient did have a low-grade temperature of 100.3 as well as elevated white count of 12.9, we will also screen for potential infection.  Able to move his lower extremities and no sensation loss.  Viral respiratory panel obtained and is negative, chest x-ray obtained shows a faint patchy opacity bilaterally suggestive of pneumonia.  Will treat for pneumonia with Rocephin and Zithromax and will admit patient for further management.  1:45 AM Appreciate consultation from [facility] Dr. Myna Hidalgo who agrees to see and will admit pt for further management of CAP. At this time I have low suspicion for caudal equina.    BP 120/72    Pulse 83    Temp 100.3 F (37.9 C) (Oral)    Resp 16    SpO2 97%   Results for orders placed or performed during the hospital encounter of 01/06/22  Resp Panel by RT-PCR (Flu A&B, Covid) Nasopharyngeal Swab   Specimen: Nasopharyngeal Swab; Nasopharyngeal(NP) swabs in vial transport medium  Result Value Ref Range   SARS Coronavirus 2 by RT PCR NEGATIVE NEGATIVE   Influenza A by PCR NEGATIVE NEGATIVE   Influenza B by PCR NEGATIVE NEGATIVE  Basic metabolic panel  Result Value Ref Range   Sodium 132 (L) 135 - 145 mmol/L   Potassium 4.5 3.5 - 5.1 mmol/L   Chloride 99 98 - 111  mmol/L   CO2 25 22 - 32 mmol/L   Glucose, Bld 127 (H) 70 - 99 mg/dL   BUN 23 8 - 23 mg/dL   Creatinine, Ser 0.91 0.61 - 1.24 mg/dL   Calcium 8.7 (L) 8.9 - 10.3 mg/dL   GFR, Estimated >60 >60 mL/min   Anion gap 8 5 - 15  CBC with Differential  Result Value Ref Range   WBC 12.9 (H) 4.0 - 10.5 K/uL   RBC 3.62 (L) 4.22 - 5.81 MIL/uL   Hemoglobin 11.5 (L) 13.0 - 17.0 g/dL   HCT 34.3 (L) 39.0 - 52.0 %   MCV 94.8 80.0 - 100.0 fL   MCH 31.8 26.0 - 34.0 pg   MCHC 33.5 30.0 - 36.0 g/dL   RDW 13.6 11.5 - 15.5 %   Platelets 182 150 - 400 K/uL   nRBC 0.0 0.0 - 0.2 %   Neutrophils Relative % 85 %   Neutro Abs 11.1 (H) 1.7 - 7.7 K/uL   Lymphocytes Relative 7 %   Lymphs Abs 0.8 0.7 - 4.0 K/uL   Monocytes Relative 7 %   Monocytes Absolute 0.9 0.1 - 1.0 K/uL   Eosinophils Relative 0 %   Eosinophils Absolute 0.0 0.0 - 0.5 K/uL   Basophils Relative 0 %   Basophils Absolute 0.0 0.0 - 0.1 K/uL   Immature Granulocytes 1 %  Abs Immature Granulocytes 0.08 (H) 0.00 - 0.07 K/uL  Urinalysis, Routine w reflex microscopic Urine, Clean Catch  Result Value Ref Range   Color, Urine YELLOW YELLOW   APPearance HAZY (A) CLEAR   Specific Gravity, Urine 1.027 1.005 - 1.030   pH 5.0 5.0 - 8.0   Glucose, UA NEGATIVE NEGATIVE mg/dL   Hgb urine dipstick SMALL (A) NEGATIVE   Bilirubin Urine NEGATIVE NEGATIVE   Ketones, ur 5 (A) NEGATIVE mg/dL   Protein, ur 100 (A) NEGATIVE mg/dL   Nitrite NEGATIVE NEGATIVE   Leukocytes,Ua NEGATIVE NEGATIVE   RBC / HPF 0-5 0 - 5 RBC/hpf   WBC, UA 6-10 0 - 5 WBC/hpf   Bacteria, UA RARE (A) NONE SEEN   Squamous Epithelial / LPF 0-5 0 - 5   Mucus PRESENT    DG Chest 1 View  Result Date: 01/06/2022 CLINICAL DATA:  Fever EXAM: CHEST  1 VIEW COMPARISON:  10/23/2021 FINDINGS: Faint subpleural patchy opacities in the lungs bilaterally. No pleural effusion or pneumothorax. The heart is normal in size. Postsurgical changes related to prior CABG. Thoracic aortic atherosclerosis.  Thoracic spine stimulator.  Median sternotomy. IMPRESSION: Faint subpleural patchy opacities in the lungs bilaterally, suggesting mild infection/pneumonia. Electronically Signed   By: Julian Hy M.D.   On: 01/06/2022 22:56   CT Lumbar Spine Wo Contrast  Result Date: 01/07/2022 CLINICAL DATA:  Fall 2 days ago, severe lower back and leg pain EXAM: CT LUMBAR SPINE WITHOUT CONTRAST TECHNIQUE: Multidetector CT imaging of the lumbar spine was performed without intravenous contrast administration. Multiplanar CT image reconstructions were also generated. RADIATION DOSE REDUCTION: This exam was performed according to the departmental dose-optimization program which includes automated exposure control, adjustment of the mA and/or kV according to patient size and/or use of iterative reconstruction technique. COMPARISON:  08/02/2010 CT lumbar spine, correlation is also made with 03/29/2018 MRI lumbar spine FINDINGS: Segmentation: 5 lumbar type vertebrae. Partial sacralization of L5 with right-greater-than-left L5-S1 pseudoarticulation of broadened L5 transverse processes with the sacral ala. Alignment: Dextrocurvature of the lumbar spine. Trace retrolisthesis L1 on L2, L2 on L3, and L3 on L4, unchanged compared to the 2019 MRI. Vertebrae: Osteopenia. No definite acute fracture. Osseous fusion of the lower thoracic spine with L1. Chronic appearing right L4 pars defect (series 9, image 33). Endplate degenerative changes with multifocal Schmorl's node formation. Paraspinal and other soft tissues: Neural stimulator in the left lower back soft tissues, with leads entering the spinal canal at T12-L1. Prior aorto bi-iliac graft. Disc levels: T12-L1: Osseous fusion across the disc space. Mild facet arthropathy. No spinal canal stenosis or neural foraminal narrowing. L1-L2: Left eccentric disc bulge. Mild facet arthropathy. No spinal canal stenosis. Moderate bilateral neural foraminal narrowing. L2-L3: Disc osteophyte  complex. Moderate facet arthropathy. Moderate spinal canal stenosis. No neural foraminal narrowing. L3-L4: Moderate disc bulge. Severe facet arthropathy. Ligamentum flavum hypertrophy. Moderate spinal canal stenosis and moderate right-greater-than-left neural foraminal narrowing. L4-L5: Broad-based disc bulge. Severe facet arthropathy. Mild spinal canal stenosis and mild bilateral neural foraminal narrowing. L5-S1: Disc osteophyte complex. Moderate facet arthropathy. No spinal canal stenosis moderate to severe bilateral neural foraminal narrowing. IMPRESSION: 1. Evaluation is somewhat limited by degree of osteopenia. Within this limitation, no definite acute fracture. No traumatic listhesis. 2. Multilevel degenerative changes, with dextrocurvature of the lumbar spine and multilevel retrolistheses. Electronically Signed   By: Merilyn Baba M.D.   On: 01/07/2022 00:06      Domenic Moras, PA-C 01/07/22 [place], Donald,  MD 01/07/22 [id number]

## 2022-01-06 NOTE — ED Notes (Signed)
Patient transported to X-ray 

## 2022-01-06 NOTE — ED Notes (Addendum)
Pt BIBA for fall 2 days ago down 3 stairs mechanical fall. -LOC, head or neck pain. A/ox4, pt called because he is no longer able to sit or ambulate. +pain radiating down bilateral legs. +PMSC. Denies numbness/tingling or incontinence

## 2022-01-06 NOTE — ED Notes (Signed)
ED Provider at bedside. 

## 2022-01-06 NOTE — ED Provider Notes (Addendum)
[facility] DEPT Provider Note   CSN: [id number] Arrival date & time: 01/06/22  1856     History  Chief Complaint  Patient presents with   Back Pain    [name] [name] is a 81 y.o. male.  With past medical history of hypertension, CAD, spinal stenosis who presents to the emergency department with back pain.  Patient states that on Saturday he was at home when he stumbled and fell.  He states that he was getting up from his chair and was turning around to walk into the kitchen when he lost his balance falling backward and landing on his back.  He denies hitting his head or loss of consciousness.  Not anticoagulated.  He denies dizziness, lightheadedness, chest pain or shortness of breath prior to or after the fall.  He states that since the fall he has had worsening low back pain.  Additionally he has had 3 episodes of urinating on himself.  Difficult to ascertain whether this is due to decreased mobility secondary to pain or incontinence.  This is not happened previously.  He denies saddle anesthesia.  Denies numbness or tingling to his lower extremities.  He does endorse difficulty ambulating secondary to pain but denies weakness.  He does have a history of lumbar laminectomy in 2017.   Back Pain Associated symptoms: no fever, no headaches and no weakness       Home Medications Prior to Admission medications   Medication Sig Start Date End Date Taking? Authorizing Provider  aspirin EC 81 MG tablet Take 81 mg by mouth in the morning. Swallow whole.    [provider]  betamethasone valerate ointment (VALISONE) 0.1 % Apply 1 application topically 2 (two) times daily. Patient taking differently: Apply 1 application topically 2 (two) times daily as needed (skin irritation/rash). 07/10/20   Hoyt Koch, MD  carvedilol (COREG) 12.5 MG tablet Take 1 tablet (12.5 mg total) by mouth 2 (two) times daily with a meal. 07/29/21   Freada Bergeron,  MD  esomeprazole (NEXIUM) 20 MG capsule Take 20 mg by mouth in the morning.    [provider]  HYDROcodone-acetaminophen (NORCO) 10-325 MG tablet Take 1 tablet by mouth daily as needed for pain. 10/16/21   [provider]  ipratropium (ATROVENT) 0.03 % nasal spray SPRAY 2 SPRAYS INTO EACH NOSTRIL EVERY 12 HOURS Patient taking differently: Place 2 sprays into both nostrils 2 (two) times daily as needed for rhinitis. 05/07/21   Hoyt Koch, MD  lisinopril (ZESTRIL) 40 MG tablet Take 1 tablet (40 mg total) by mouth daily. 08/07/21   Freada Bergeron, MD  montelukast (SINGULAIR) 10 MG tablet TAKE 1 TABLET BY MOUTH EVERYDAY AT BEDTIME 11/25/21   Hoyt Koch, MD  Multiple Vitamin (MULTIVITAMIN WITH MINERALS) TABS tablet Take 1 tablet by mouth in the morning. Centrum for Men    [provider]  nitroGLYCERIN (NITROSTAT) 0.4 MG SL tablet Place 1 tablet (0.4 mg total) under the tongue every 5 (five) minutes as needed for chest pain. 05/18/17   Nche, Charlene Brooke, NP  polyethylene glycol (MIRALAX / GLYCOLAX) 17 g packet Take 17 g by mouth daily.    [provider]  rosuvastatin (CRESTOR) 20 MG tablet Take 1 tablet (20 mg total) by mouth daily. 08/26/21   Freada Bergeron, MD  senna-docusate (SENOKOT-S) 8.6-50 MG tablet Take 2 tablets by mouth in the morning and at bedtime.    [provider]  tamsulosin [facility])  0.4 MG CAPS capsule Take 2 capsules (0.8 mg total) by mouth daily. Patient taking differently: Take 0.8 mg by mouth at bedtime. 06/18/21   Franchot Gallo, MD  traMADol (ULTRAM) 50 MG tablet Take 50 mg by mouth every 4 (four) hours as needed for moderate pain.     [provider]      Allergies    Penicillins    Review of Systems   Review of Systems  Constitutional:  Negative for fever.  Genitourinary:  Positive for difficulty urinating.  Musculoskeletal:  Positive for back pain and gait problem.  Neurological:   Negative for dizziness, syncope, weakness, light-headedness and headaches.  All other systems reviewed and are negative.  Physical Exam Updated Vital Signs BP (!) 151/79    Pulse 92    Temp 100.3 F (37.9 C) (Oral)    Resp 16    SpO2 96%  Physical Exam Vitals and nursing note reviewed.  Constitutional:      General: He is not in acute distress.    Appearance: Normal appearance. He is normal weight. He is not ill-appearing or toxic-appearing.  HENT:     Head: Normocephalic and atraumatic.     Mouth/Throat:     Mouth: Mucous membranes are dry.     Pharynx: Oropharynx is clear.  Eyes:     General: No scleral icterus.    Extraocular Movements: Extraocular movements intact.     Pupils: Pupils are equal, round, and reactive to light.  Cardiovascular:     Rate and Rhythm: Normal rate and regular rhythm.     Pulses: Normal pulses.     Heart sounds: No murmur heard. Pulmonary:     Effort: Pulmonary effort is normal. No respiratory distress.     Breath sounds: Normal breath sounds.  Abdominal:     General: Bowel sounds are normal. There is no distension.     Palpations: Abdomen is soft.  Musculoskeletal:        General: No swelling or signs of injury.     Cervical back: Normal range of motion and neck supple. No rigidity or tenderness.     Lumbar back: Bony tenderness present. No swelling or edema. Decreased range of motion.     Comments: Strength 4/5 in bilateral lower extremities.  DP pulse 2+ bilateral feet.  No obvious deformities of the lower extremities.  Skin:    General: Skin is warm and dry.     Capillary Refill: Capillary refill takes less than 2 seconds.  Neurological:     General: No focal deficit present.     Mental Status: He is alert and oriented to person, place, and time.     Cranial Nerves: No cranial nerve deficit.     Sensory: No sensory deficit.  Psychiatric:        Mood and Affect: Mood normal.        Behavior: Behavior normal.        Thought Content:  Thought content normal.        Judgment: Judgment normal.    ED Results / Procedures / Treatments   Labs (all labs ordered are listed, but only abnormal results are displayed) Labs Reviewed  BASIC METABOLIC PANEL - Abnormal; Notable for the following components:      Result Value   Sodium 132 (*)    Glucose, Bld 127 (*)    Calcium 8.7 (*)    All other components within normal limits  CBC WITH DIFFERENTIAL/PLATELET - Abnormal; Notable for the following  components:   WBC 12.9 (*)    RBC 3.62 (*)    Hemoglobin 11.5 (*)    HCT 34.3 (*)    Neutro Abs 11.1 (*)    Abs Immature Granulocytes 0.08 (*)    All other components within normal limits  URINALYSIS, ROUTINE W REFLEX MICROSCOPIC - Abnormal; Notable for the following components:   APPearance HAZY (*)    Hgb urine dipstick SMALL (*)    Ketones, ur 5 (*)    Protein, ur 100 (*)    Bacteria, UA RARE (*)    All other components within normal limits   EKG None  Radiology No results found.  Procedures Procedures   Medications Ordered in ED Medications  HYDROcodone-acetaminophen (NORCO/VICODIN) 5-325 MG per tablet 1 tablet (has no administration in time range)    ED Course/ Medical Decision Making/ A&P                           Medical Decision Making Amount and/or Complexity of Data Reviewed Labs: ordered. Radiology: ordered.  Risk Prescription drug management.  Patient presents to the ED with complaints of back pain. This involves an extensive number of treatment options, and is a complaint that carries with it a moderate to high risk of complications and morbidity.   Additional history obtained:  Additional history obtained from: son at bedside External records from outside source obtained and reviewed including: previous ED visits   Lab Results: I Ordered, reviewed, and interpreted labs. Pertinent results include: BMP with no critical derangements CBC with leukocytosis 12.9, may be reactive Hemoglobin 11.5,  similar to previous UA with rare bacteria, negative for leukocytes, nitrites, white blood cells  Imaging Studies ordered:  I ordered imaging studies which included MRI lumbar spine.  I independently reviewed & interpreted imaging & am in agreement with radiology impression. Imaging shows: Pending  Medications  I ordered medication including Norco for pain Reevaluation of the patient after medication shows that patient  recently given, will re-evaluate at appropriate interval  81 year old male who presents emergency department after a fall and worsening lower back pain.  He has had 3 episodes of incontinence.  Sounds like urge incontinence on exam as he is sudden need to void and then has had incontinence however this is new since fall and so will obtain MRI to rule out cauda equina.  If MRI results are without acute stenosis/cauda equina, feel that patient is appropriate for discharge with steroids, pain control, NS follow-up. Care of patient being handed off at this time to Domenic Moras, PA-C. Final treatment and disposition pending MRI imaging.  Final Clinical Impression(s) / ED Diagnoses Final diagnoses:  None    Rx / DC Orders ED Discharge Orders     None         Mickie Hillier, PA-C 01/06/22 2216    Mickie Hillier, PA-C 01/06/22 2222    Kommor, Debe Coder, MD 01/07/22 [phone]

## 2022-01-06 NOTE — ED Triage Notes (Signed)
Pt BIBA for fall 2 days ago down 3 stairs mechanical fall. -LOC, head or neck pain. A/ox4, pt called because he is no longer able to sit or ambulate. +pain radiating down bilateral legs

## 2022-01-06 NOTE — ED Notes (Signed)
Bladder scanner show 0 mL, was ran twice.

## 2022-01-06 NOTE — ED Notes (Signed)
Patient transported to CT 

## 2022-01-07 ENCOUNTER — Other Ambulatory Visit: Payer: Self-pay

## 2022-01-07 ENCOUNTER — Other Ambulatory Visit (HOSPITAL_COMMUNITY): Payer: PPO

## 2022-01-07 DIAGNOSIS — E440 Moderate protein-calorie malnutrition: Secondary | ICD-10-CM | POA: Diagnosis present

## 2022-01-07 DIAGNOSIS — J189 Pneumonia, unspecified organism: Secondary | ICD-10-CM | POA: Diagnosis not present

## 2022-01-07 DIAGNOSIS — R7401 Elevation of levels of liver transaminase levels: Secondary | ICD-10-CM | POA: Diagnosis present

## 2022-01-07 LAB — COMPREHENSIVE METABOLIC PANEL
ALT: 111 U/L — ABNORMAL HIGH (ref 0–44)
AST: 135 U/L — ABNORMAL HIGH (ref 15–41)
Albumin: 2.9 g/dL — ABNORMAL LOW (ref 3.5–5.0)
Alkaline Phosphatase: 96 U/L (ref 38–126)
Anion gap: 8 (ref 5–15)
BUN: 21 mg/dL (ref 8–23)
CO2: 24 mmol/L (ref 22–32)
Calcium: 8.5 mg/dL — ABNORMAL LOW (ref 8.9–10.3)
Chloride: 103 mmol/L (ref 98–111)
Creatinine, Ser: 0.87 mg/dL (ref 0.61–1.24)
GFR, Estimated: >60 mL/min (ref >60)
Glucose, Bld: 117 mg/dL — ABNORMAL HIGH (ref 70–99)
Potassium: 4.1 mmol/L (ref 3.5–5.1)
Sodium: 135 mmol/L (ref 135–145)
Total Bilirubin: 0.6 mg/dL (ref 0.3–1.2)
Total Protein: 6.4 g/dL — ABNORMAL LOW (ref 6.5–8.1)

## 2022-01-07 LAB — CBC
HCT: 33.2 % — ABNORMAL LOW (ref 39.0–52.0)
Hemoglobin: 10.9 g/dL — ABNORMAL LOW (ref 13.0–17.0)
MCH: 30.8 pg (ref 26.0–34.0)
MCHC: 32.8 g/dL (ref 30.0–36.0)
MCV: 93.8 fL (ref 80.0–100.0)
Platelets: 172 10*3/uL (ref 150–400)
RBC: 3.54 MIL/uL — ABNORMAL LOW (ref 4.22–5.81)
RDW: 13.8 % (ref 11.5–15.5)
WBC: 13.4 10*3/uL — ABNORMAL HIGH (ref 4.0–10.5)
nRBC: 0 % (ref 0.0–0.2)

## 2022-01-07 LAB — PHOSPHORUS: Phosphorus: 2.4 mg/dL — ABNORMAL LOW (ref 2.5–4.6)

## 2022-01-07 LAB — STREP PNEUMONIAE URINARY ANTIGEN: Strep Pneumo Urinary Antigen: NEGATIVE

## 2022-01-07 MED ORDER — SODIUM CHLORIDE 0.9 % IV SOLN
500.0000 mg | Freq: Every day | INTRAVENOUS | Status: DC
  Administered 2022-01-07 – 2022-01-09 (×2): 500 mg via INTRAVENOUS
  Filled 2022-01-07 (×2): qty 5

## 2022-01-07 MED ORDER — MONTELUKAST SODIUM 10 MG PO TABS
10.0000 mg | ORAL_TABLET | Freq: Every evening | ORAL | Status: DC
  Administered 2022-01-07 – 2022-01-10 (×4): 10 mg via ORAL
  Filled 2022-01-07 (×4): qty 1

## 2022-01-07 MED ORDER — BISACODYL 10 MG RE SUPP: 10.0000 mg | Freq: Every day | RECTAL | Status: DC | PRN

## 2022-01-07 MED ORDER — ACETAMINOPHEN 325 MG PO TABS
650.0000 mg | ORAL_TABLET | Freq: Four times a day (QID) | ORAL | Status: DC | PRN
  Administered 2022-01-07 – 2022-01-10 (×8): 650 mg via ORAL
  Filled 2022-01-07 (×9): qty 2

## 2022-01-07 MED ORDER — ASPIRIN EC 81 MG PO TBEC
81.0000 mg | DELAYED_RELEASE_TABLET | Freq: Every day | ORAL | Status: DC
  Administered 2022-01-08 – 2022-01-11 (×4): 81 mg via ORAL
  Filled 2022-01-07 (×4): qty 1

## 2022-01-07 MED ORDER — LISINOPRIL 20 MG PO TABS
40.0000 mg | ORAL_TABLET | Freq: Every day | ORAL | Status: DC
  Administered 2022-01-08 – 2022-01-11 (×4): 40 mg via ORAL
  Filled 2022-01-07 (×4): qty 2

## 2022-01-07 MED ORDER — KETOROLAC TROMETHAMINE 15 MG/ML IJ SOLN
15.0000 mg | Freq: Once | INTRAMUSCULAR | Status: AC
  Administered 2022-01-07: 15 mg via INTRAVENOUS
  Filled 2022-01-07: qty 1

## 2022-01-07 MED ORDER — POLYETHYLENE GLYCOL 3350 17 G PO PACK
17.0000 g | PACK | Freq: Every day | ORAL | Status: DC
  Administered 2022-01-11: 17 g via ORAL
  Filled 2022-01-07 (×3): qty 1

## 2022-01-07 MED ORDER — ONDANSETRON HCL 4 MG PO TABS: 4.0000 mg | ORAL_TABLET | Freq: Four times a day (QID) | ORAL | Status: DC | PRN

## 2022-01-07 MED ORDER — OXYCODONE HCL 5 MG PO TABS
5.0000 mg | ORAL_TABLET | ORAL | Status: AC | PRN
  Administered 2022-01-07 (×2): 5 mg via ORAL
  Filled 2022-01-07 (×2): qty 1

## 2022-01-07 MED ORDER — OXYCODONE HCL 5 MG PO TABS
5.0000 mg | ORAL_TABLET | Freq: Once | ORAL | Status: AC
  Administered 2022-01-07: 5 mg via ORAL
  Filled 2022-01-07: qty 1

## 2022-01-07 MED ORDER — ENSURE ENLIVE PO LIQD
237.0000 mL | Freq: Two times a day (BID) | ORAL | Status: DC
  Administered 2022-01-08 – 2022-01-11 (×4): 237 mL via ORAL

## 2022-01-07 MED ORDER — HYDROCODONE-ACETAMINOPHEN 5-325 MG PO TABS: 1.0000 | ORAL_TABLET | Freq: Four times a day (QID) | ORAL | Status: DC | PRN

## 2022-01-07 MED ORDER — SENNOSIDES-DOCUSATE SODIUM 8.6-50 MG PO TABS
2.0000 | ORAL_TABLET | Freq: Two times a day (BID) | ORAL | Status: DC
  Filled 2022-01-07: qty 2

## 2022-01-07 MED ORDER — PANTOPRAZOLE SODIUM 40 MG PO TBEC
40.0000 mg | DELAYED_RELEASE_TABLET | Freq: Every day | ORAL | Status: DC
  Administered 2022-01-08 – 2022-01-11 (×4): 40 mg via ORAL
  Filled 2022-01-07 (×4): qty 1

## 2022-01-07 MED ORDER — ENOXAPARIN SODIUM 40 MG/0.4ML IJ SOSY
40.0000 mg | PREFILLED_SYRINGE | INTRAMUSCULAR | Status: DC
  Administered 2022-01-07 – 2022-01-11 (×5): 40 mg via SUBCUTANEOUS
  Filled 2022-01-07 (×5): qty 0.4

## 2022-01-07 MED ORDER — SODIUM CHLORIDE 0.9 % IV SOLN
1.0000 g | Freq: Once | INTRAVENOUS | Status: AC
  Administered 2022-01-07: 1 g via INTRAVENOUS
  Filled 2022-01-07: qty 10

## 2022-01-07 MED ORDER — ONDANSETRON HCL 4 MG/2ML IJ SOLN: 4.0000 mg | Freq: Four times a day (QID) | INTRAMUSCULAR | Status: DC | PRN

## 2022-01-07 MED ORDER — IPRATROPIUM BROMIDE 0.03 % NA SOLN
2.0000 | Freq: Two times a day (BID) | NASAL | Status: DC | PRN
  Filled 2022-01-07: qty 30

## 2022-01-07 MED ORDER — K PHOS MONO-SOD PHOS DI & MONO 155-852-130 MG PO TABS
250.0000 mg | ORAL_TABLET | Freq: Three times a day (TID) | ORAL | Status: AC
  Administered 2022-01-07 (×3): 250 mg via ORAL
  Filled 2022-01-07 (×6): qty 1

## 2022-01-07 MED ORDER — ROSUVASTATIN CALCIUM 20 MG PO TABS: 20.0000 mg | ORAL_TABLET | Freq: Every day | ORAL | Status: DC

## 2022-01-07 MED ORDER — ALBUTEROL SULFATE (2.5 MG/3ML) 0.083% IN NEBU: 2.5000 mg | INHALATION_SOLUTION | RESPIRATORY_TRACT | Status: DC | PRN

## 2022-01-07 MED ORDER — TAMSULOSIN HCL 0.4 MG PO CAPS
0.4000 mg | ORAL_CAPSULE | Freq: Every day | ORAL | Status: DC
  Administered 2022-01-08 – 2022-01-11 (×4): 0.4 mg via ORAL
  Filled 2022-01-07 (×4): qty 1

## 2022-01-07 MED ORDER — OXYCODONE HCL 5 MG PO TABS
5.0000 mg | ORAL_TABLET | ORAL | Status: AC | PRN
  Administered 2022-01-08 (×2): 5 mg via ORAL
  Filled 2022-01-07 (×2): qty 1

## 2022-01-07 MED ORDER — SODIUM CHLORIDE 0.9 % IV SOLN
500.0000 mg | Freq: Once | INTRAVENOUS | Status: AC
  Administered 2022-01-07: 500 mg via INTRAVENOUS
  Filled 2022-01-07: qty 5

## 2022-01-07 MED ORDER — CARVEDILOL 12.5 MG PO TABS
12.5000 mg | ORAL_TABLET | Freq: Two times a day (BID) | ORAL | Status: DC
  Administered 2022-01-08 – 2022-01-11 (×6): 12.5 mg via ORAL
  Filled 2022-01-07 (×7): qty 1

## 2022-01-07 MED ORDER — SODIUM CHLORIDE 0.9 % IV SOLN
1.0000 g | INTRAVENOUS | Status: DC
  Administered 2022-01-07 – 2022-01-10 (×4): 1 g via INTRAVENOUS
  Filled 2022-01-07 (×4): qty 10

## 2022-01-07 MED ORDER — KETOROLAC TROMETHAMINE 15 MG/ML IJ SOLN
15.0000 mg | Freq: Every evening | INTRAMUSCULAR | Status: AC | PRN
  Administered 2022-01-07: 15 mg via INTRAVENOUS
  Filled 2022-01-07: qty 1

## 2022-01-07 MED ORDER — MAGNESIUM OXIDE -MG SUPPLEMENT 400 (240 MG) MG PO TABS
400.0000 mg | ORAL_TABLET | Freq: Two times a day (BID) | ORAL | Status: DC
  Administered 2022-01-07 – 2022-01-08 (×4): 400 mg via ORAL
  Filled 2022-01-07 (×4): qty 1

## 2022-01-07 NOTE — ED Notes (Signed)
Provided pt with a bedside fan and a pitcher of ice water per request.

## 2022-01-07 NOTE — Progress Notes (Signed)
PT demonstrated hands on understanding of Flutter device. Has strong, non productive cough at this time.

## 2022-01-07 NOTE — ED Notes (Signed)
Pt was given a cup of water.

## 2022-01-07 NOTE — H&P (Signed)
History and Physical    [name] [name] KPT:[id number] DOB: 1941-10-23 DOA: 01/06/2022  PCP: Hoyt Koch, MD   Patient coming from: Home.  I have personally briefly reviewed patient's old medical records in [place]  Chief Complaint: Lower back pain/fall.  HPI: [name] [name] is a 81 y.o. male with medical history significant of AAA, chronic back pain, lumbar DDD, spinal stenosis, CAD, history of MI, CABG, RBBB, carotid artery disease, carotid bruits, cataracts, thrombocytopenia, GERD, hypertension, MGUS who is coming to the emergency department with complaints of back pain due to a fall from 3 stairs at home.  Denied LOC.  This was a mechanical fall.  No chest pain, palpitations, dizziness, diaphoresis, nausea or vomiting.  He has been having trouble walking since then.  He has stayed in bed and has urinated on himself due to pain and lack of mobility.  No fecal incontinence.  Denied any weakness or paresthesia.  He has had some productive cough for several days, but no significant dyspnea, wheezing or hemoptysis.  Denies abdominal pain, diarrhea, melena or hematochezia.  He gets frequently constipated.  No flank pain, dysuria or hematuria.  No polyuria, polydipsia, polyphagia or blurred vision..  ED Course: Initial vital signs were temperature 100.3 F, pulse 96, respiration 18, BP 154/78 mmHg O2 sat 95% on room air.  Patient received as Norco 5/325 tablet, ceftriaxone and Zithromax in the emergency department.  I added ketorolac 15 mg IVP and oxycodone 5 mg p.o. x1.  Lab work: Urinalysis shows small hemoglobinuria with rare bacteria.  Ketonuria 5 proteinuria 100 mg/dL.  CBC is her white count 12.9, hemoglobin 11.5 g/dL and platelets 182.  CMP showed normal electrolytes and renal function.  Glucose 117 mg/dL.  Total protein 6.4 and albumin 2.9 g/dL.  AST was 135 and ALT 111 units/L.  Phosphorus was 2.4 mg/dL.  Imaging 1 view chest radiograph showed a faint subpleural patchy  opacities in the lungs bilaterally, suggesting mild infection or pneumonia.  CT lumbar spine without contrast shows osteopenia which limits the study and multilevel degenerative changes.  No acute fracture seen.  No traumatic listhesis.  Review of Systems: As per HPI otherwise all other systems reviewed and are negative.  Past Medical History:  Diagnosis Date   AAA (abdominal aortic aneurysm)    Back pain    decompression belt bought off of TV   CAD (coronary artery disease)    Carotid artery disease ([place])    a. duplex 2015 - 1-39% bilaterally.   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 Ralene Ok    Past Surgical History:  Procedure Laterality Date   ABDOMINAL AORTIC ENDOVASCULAR STENT GRAFT N/A 10/23/2021   Procedure: ABDOMINAL AORTIC ENDOVASCULAR STENT GRAFT;  Surgeon: Cherre Robins, MD;  Location: [facility];  Service: Vascular;  Laterality: N/A;   BACK SURGERY     COLONOSCOPY     COLONOSCOPY WITH PROPOFOL N/A 07/13/2017  Procedure: COLONOSCOPY WITH PROPOFOL;  Surgeon: Manus Gunning, MD;  Location: [facility];  Service: Gastroenterology;  Laterality: N/A;   CORONARY ARTERY BYPASS GRAFT  2004   CYSTOSCOPY WITH INSERTION OF UROLIFT N/A 12/14/2018   Procedure: CYSTOSCOPY WITH INSERTION OF UROLIFT;  Surgeon: Franchot Gallo, MD;  Location: [facility];  Service: Urology;  Laterality: N/A;   CYSTOSCOPY WITH INSERTION OF UROLIFT N/A 09/04/2020   Procedure: CYSTOSCOPY WITH INSERTION OF UROLIFT;  Surgeon: Franchot Gallo, MD;   Location: [facility];  Service: Urology;  Laterality: N/A;   DECOMPRESSIVE LUMBAR LAMINECTOMY LEVEL 1 N/A 09/04/2016   Procedure: DECOMPRESSION L3 - L4;  Surgeon: Melina Schools, MD;  Location: [facility];  Service: Orthopedics;  Laterality: N/A;   EYE SURGERY  2013   bilateral cataract extraction w/ IOL (Dr. Celene Squibb), retina surgery   JOINT REPLACEMENT     LUMBAR LAMINECTOMY/DECOMPRESSION MICRODISCECTOMY  09/04/2016   L3-4   SPINAL CORD STIMULATOR INSERTION N/A 05/13/2019   Procedure: LUMBAR SPINAL CORD STIMULATOR INSERTION;  Surgeon: Clydell Hakim, MD;  Location: [place];  Service: Neurosurgery;  Laterality: N/A;  LUMBAR SPINAL CORD STIMULATOR INSERTION   TONSILLECTOMY     TOTAL HIP ARTHROPLASTY Right 10/2003   ULTRASOUND GUIDANCE FOR VASCULAR ACCESS Bilateral 10/23/2021   Procedure: ULTRASOUND GUIDANCE FOR VASCULAR ACCESS;  Surgeon: Cherre Robins, MD;  Location: [facility];  Service: Vascular;  Laterality: Bilateral;    Social History  reports that he has been smoking cigarettes. He has a 32.50 pack-year smoking history. He has never used smokeless tobacco. He reports that he does not currently use alcohol. He reports that he does not use drugs.  Allergies  Allergen Reactions   Penicillins Other (See Comments)    PATIENT HAS HAD A PCN REACTION WITH IMMEDIATE RASH, FACIAL/TONGUE/THROAT SWELLING, SOB, OR LIGHTHEADEDNESS WITH HYPOTENSION:   #  YES  #    Has patient had a PCN reaction causing severe rash involving mucus membranes or skin necrosis: No Has patient had a PCN reaction that required hospitalization No Has patient had a PCN reaction occurring within the last 10 years: No If all of the above answers are "NO", then may proceed with Cephalosporin use.  Passed out    Family History  Problem Relation Age of Onset   Kidney disease Mother    COPD Mother    Stroke Father        cerebral hemorrhage   Heart disease Brother    Heart disease Brother    Arthritis Brother    Cancer Paternal  Grandfather    Prior to Admission medications   Medication Sig Start Date End Date Taking? Authorizing Provider  aspirin EC 81 MG tablet Take 81 mg by mouth in the morning. Swallow whole.    [provider]  betamethasone valerate ointment (VALISONE) 0.1 % Apply 1 application topically 2 (two) times daily. Patient taking differently: Apply 1 application topically 2 (two) times daily as needed (skin irritation/rash). 07/10/20   Hoyt Koch, MD  carvedilol (COREG) 12.5 MG tablet Take 1 tablet (12.5 mg total) by mouth 2 (two) times daily with a meal. 07/29/21   Freada Bergeron, MD  esomeprazole (NEXIUM) 20 MG capsule Take 20 mg by mouth in the morning.    [provider]  HYDROcodone-acetaminophen (NORCO) 10-325 MG tablet Take 1 tablet by mouth daily as needed for pain. 10/16/21   [provider]  ipratropium (ATROVENT) 0.03 % nasal spray SPRAY 2 SPRAYS INTO EACH NOSTRIL EVERY 12 HOURS Patient  taking differently: Place 2 sprays into both nostrils 2 (two) times daily as needed for rhinitis. 05/07/21   Hoyt Koch, MD  lisinopril (ZESTRIL) 40 MG tablet Take 1 tablet (40 mg total) by mouth daily. 08/07/21   Freada Bergeron, MD  montelukast (SINGULAIR) 10 MG tablet TAKE 1 TABLET BY MOUTH EVERYDAY AT BEDTIME 11/25/21   Hoyt Koch, MD  Multiple Vitamin (MULTIVITAMIN WITH MINERALS) TABS tablet Take 1 tablet by mouth in the morning. Centrum for Men    [provider]  nitroGLYCERIN (NITROSTAT) 0.4 MG SL tablet Place 1 tablet (0.4 mg total) under the tongue every 5 (five) minutes as needed for chest pain. 05/18/17   Nche, Charlene Brooke, NP  polyethylene glycol (MIRALAX / GLYCOLAX) 17 g packet Take 17 g by mouth daily.    [provider]  rosuvastatin (CRESTOR) 20 MG tablet Take 1 tablet (20 mg total) by mouth daily. 08/26/21   Freada Bergeron, MD  senna-docusate (SENOKOT-S) 8.6-50 MG tablet Take 2 tablets by mouth in the morning  and at bedtime.    [provider]  tamsulosin (FLOMAX) 0.4 MG CAPS capsule Take 2 capsules (0.8 mg total) by mouth daily. Patient taking differently: Take 0.8 mg by mouth at bedtime. 06/18/21   Franchot Gallo, MD  traMADol (ULTRAM) 50 MG tablet Take 50 mg by mouth every 4 (four) hours as needed for moderate pain.     [provider]    Physical Exam: Vitals:   01/07/22 0600 01/07/22 0630 01/07/22 0700 01/07/22 0930  BP: 132/78 121/62 (!) 158/76 (!) 149/86  Pulse: 84 84 93 89  Resp: 17 16 17 20   Temp:      TempSrc:      SpO2: 98% 96% 96% 92%    Constitutional: NAD, calm, comfortable Eyes: PERRL, lids and conjunctivae normal ENMT: Mucous membranes are moist. Posterior pharynx clear of any exudate or lesions. Neck: normal, supple, no masses, no thyromegaly Respiratory: Decreased breath sounds in bases, otherwise clear to auscultation bilaterally, no wheezing, no crackles. Normal respiratory effort. No accessory muscle use.  Cardiovascular: Regular rate and rhythm, no murmurs / rubs / gallops. No extremity edema. 2+ pedal pulses. No carotid bruits.  Abdomen: No distention.  Soft, no tenderness, no masses palpated. No hepatosplenomegaly. Bowel sounds positive.  Musculoskeletal: no clubbing / cyanosis.  Severely decreased lower back ROM, no contractures. Normal muscle tone.  Skin: no rashes, lesions, ulcers on limited dermatological examination. Neurologic: CN 2-12 grossly intact. Sensation intact, DTR normal. Strength 5/5 in all 4.  Psychiatric: Normal judgment and insight. Alert and oriented x 3. Normal mood.   Labs on Admission: I have personally reviewed following labs and imaging studies  CBC: Recent Labs  Lab 01/06/22 2050 01/07/22 0921  WBC 12.9* 13.4*  NEUTROABS 11.1*  --   HGB 11.5* 10.9*  HCT 34.3* 33.2*  MCV 94.8 93.8  PLT 182 854    Basic Metabolic Panel: Recent Labs  Lab 01/06/22 2050 01/07/22 0921  NA 132* 135  K 4.5 4.1  CL 99 103   CO2 25 24  GLUCOSE 127* 117*  BUN 23 21  CREATININE 0.91 0.87  CALCIUM 8.7* 8.5*  PHOS  --  2.4*    GFR: CrCl cannot be calculated (Unknown ideal weight.).  Liver Function Tests: Recent Labs  Lab 01/07/22 0921  AST 135*  ALT 111*  ALKPHOS 96  BILITOT 0.6  PROT 6.4*  ALBUMIN 2.9*    Urine analysis:    Component  Value Date/Time   COLORURINE YELLOW 01/06/2022 2026   APPEARANCEUR HAZY (A) 01/06/2022 2026   APPEARANCEUR Clear 02/26/2021 1545   LABSPEC 1.027 01/06/2022 2026   PHURINE 5.0 01/06/2022 2026   GLUCOSEU NEGATIVE 01/06/2022 2026   HGBUR SMALL (A) 01/06/2022 2026   BILIRUBINUR NEGATIVE 01/06/2022 2026        KETONESUR 5 (A) 01/06/2022 2026   PROTEINUR 100 (A) 01/06/2022 2026             NITRITE NEGATIVE 01/06/2022 2026   LEUKOCYTESUR NEGATIVE 01/06/2022 2026    Radiological Exams on Admission: DG Chest 1 View  Result Date: 01/06/2022 CLINICAL DATA:  Fever EXAM: CHEST  1 VIEW COMPARISON:  10/23/2021 FINDINGS: Faint subpleural patchy opacities in the lungs bilaterally. No pleural effusion or pneumothorax. The heart is normal in size. Postsurgical changes related to prior CABG. Thoracic aortic atherosclerosis. Thoracic spine stimulator.  Median sternotomy. IMPRESSION: Faint subpleural patchy opacities in the lungs bilaterally, suggesting mild infection/pneumonia. Electronically Signed   By: Julian Hy M.D.   On: 01/06/2022 22:56   CT Lumbar Spine Wo Contrast  Result Date: 01/07/2022 CLINICAL DATA:  Fall 2 days ago, severe lower back and leg pain EXAM: CT LUMBAR SPINE WITHOUT CONTRAST TECHNIQUE: Multidetector CT imaging of the lumbar spine was performed without intravenous contrast administration. Multiplanar CT image reconstructions were also generated. RADIATION DOSE REDUCTION: This exam was performed according to the departmental dose-optimization program which includes automated exposure control, adjustment of the mA and/or kV according to patient size  and/or use of iterative reconstruction technique. COMPARISON:  08/02/2010 CT lumbar spine, correlation is also made with 03/29/2018 MRI lumbar spine FINDINGS: Segmentation: 5 lumbar type vertebrae. Partial sacralization of L5 with right-greater-than-left L5-S1 pseudoarticulation of broadened L5 transverse processes with the sacral ala. Alignment: Dextrocurvature of the lumbar spine. Trace retrolisthesis L1 on L2, L2 on L3, and L3 on L4, unchanged compared to the 2019 MRI. Vertebrae: Osteopenia. No definite acute fracture. Osseous fusion of the lower thoracic spine with L1. Chronic appearing right L4 pars defect (series 9, image 33). Endplate degenerative changes with multifocal Schmorl's node formation. Paraspinal and other soft tissues: Neural stimulator in the left lower back soft tissues, with leads entering the spinal canal at T12-L1. Prior aorto bi-iliac graft. Disc levels: T12-L1: Osseous fusion across the disc space. Mild facet arthropathy. No spinal canal stenosis or neural foraminal narrowing. L1-L2: Left eccentric disc bulge. Mild facet arthropathy. No spinal canal stenosis. Moderate bilateral neural foraminal narrowing. L2-L3: Disc osteophyte complex. Moderate facet arthropathy. Moderate spinal canal stenosis. No neural foraminal narrowing. L3-L4: Moderate disc bulge. Severe facet arthropathy. Ligamentum flavum hypertrophy. Moderate spinal canal stenosis and moderate right-greater-than-left neural foraminal narrowing. L4-L5: Broad-based disc bulge. Severe facet arthropathy. Mild spinal canal stenosis and mild bilateral neural foraminal narrowing. L5-S1: Disc osteophyte complex. Moderate facet arthropathy. No spinal canal stenosis moderate to severe bilateral neural foraminal narrowing. IMPRESSION: 1. Evaluation is somewhat limited by degree of osteopenia. Within this limitation, no definite acute fracture. No traumatic listhesis. 2. Multilevel degenerative changes, with dextrocurvature of the lumbar spine  and multilevel retrolistheses. Electronically Signed   By: Merilyn Baba M.D.   On: 01/07/2022 00:06    EKG: Independently reviewed.   Assessment/Plan Principal Problem:   Pneumonia Observation/MedSurg. Continue supplemental oxygen as needed. Bronchodilators as needed. Continue ceftriaxone 1 g every 24 hours. Continue azithromycin 500 mg every 24 hours. Check strep pneumoniae urinary antigen. Check sputum gram stain, culture and sensitivity. Follow blood cultures and sensitivity.  Active Problems:  Back Pain Analgesics as needed. Consider PT evaluation.    Hypophosphatemia Replacing.    Transaminitis Hold statin. Recheck LFTs in AM    Moderate protein malnutrition (HCC) Protein supplementation. Consider nutritional services evaluation.    Hypertension Continue carvedilol 12.5 mg p.o. daily. Continue lisinopril 40 mg p.o. daily. Monitor BP, renal function electrolytes.      Dyslipidemia Hold rosuvastatin 10 mg p.o. daily.    CAD (coronary artery disease) Continue aspirin, carvedilol and statin.    Carotid artery disease ([place]) On aspirin and rosuvastatin.    BPH (benign prostatic hyperplasia) Continue tamsulosin.       DVT prophylaxis: Lovenox SQ. Code Status:   Full code. Family Communication:   Disposition Plan:   Patient is from:  Home.  Anticipated DC to:  Home.  Anticipated DC date:  01/09/2022.  Anticipated DC barriers: Clinical status.  Consults called:   Admission status:  Observation/MedSurg.  Severity of Illness: Reubin Milan MD [facility]  How to contact the [facility] Attending or Consulting provider [place] or covering provider during after hours [place], for this patient?   Check the care team in [facility] and look for a) attending/consulting [facility] provider listed and b) the [facility] team listed Log into [email or web address] and use [place]'s universal password to access. If you do not have the password, please contact the hospital  operator. Locate the [facility] provider you are looking for under [facility] and page to a number that you can be directly reached. If you still have difficulty reaching the provider, please page the [facility] (Director on Call) for the Hospitalists listed on amion for assistance.  01/07/2022, 10:46 AM   This document was prepared using Dragon voice recognition software and may contain some unintended transcription errors.

## 2022-01-08 ENCOUNTER — Inpatient Hospital Stay (HOSPITAL_COMMUNITY): Payer: PPO

## 2022-01-08 DIAGNOSIS — N400 Enlarged prostate without lower urinary tract symptoms: Secondary | ICD-10-CM | POA: Diagnosis present

## 2022-01-08 DIAGNOSIS — F17210 Nicotine dependence, cigarettes, uncomplicated: Secondary | ICD-10-CM | POA: Diagnosis present

## 2022-01-08 DIAGNOSIS — R1319 Other dysphagia: Secondary | ICD-10-CM | POA: Diagnosis not present

## 2022-01-08 DIAGNOSIS — M5136 Other intervertebral disc degeneration, lumbar region: Secondary | ICD-10-CM | POA: Diagnosis present

## 2022-01-08 DIAGNOSIS — Z8249 Family history of ischemic heart disease and other diseases of the circulatory system: Secondary | ICD-10-CM | POA: Diagnosis not present

## 2022-01-08 DIAGNOSIS — Z79899 Other long term (current) drug therapy: Secondary | ICD-10-CM | POA: Diagnosis not present

## 2022-01-08 DIAGNOSIS — I2510 Atherosclerotic heart disease of native coronary artery without angina pectoris: Secondary | ICD-10-CM

## 2022-01-08 DIAGNOSIS — G8929 Other chronic pain: Secondary | ICD-10-CM | POA: Diagnosis present

## 2022-01-08 DIAGNOSIS — Z951 Presence of aortocoronary bypass graft: Secondary | ICD-10-CM | POA: Diagnosis not present

## 2022-01-08 DIAGNOSIS — R7401 Elevation of levels of liver transaminase levels: Secondary | ICD-10-CM | POA: Diagnosis present

## 2022-01-08 DIAGNOSIS — R1310 Dysphagia, unspecified: Secondary | ICD-10-CM | POA: Diagnosis present

## 2022-01-08 DIAGNOSIS — I252 Old myocardial infarction: Secondary | ICD-10-CM | POA: Diagnosis not present

## 2022-01-08 DIAGNOSIS — M8588 Other specified disorders of bone density and structure, other site: Secondary | ICD-10-CM | POA: Diagnosis present

## 2022-01-08 DIAGNOSIS — M545 Low back pain, unspecified: Secondary | ICD-10-CM

## 2022-01-08 DIAGNOSIS — L8990 Pressure ulcer of unspecified site, unspecified stage: Secondary | ICD-10-CM | POA: Insufficient documentation

## 2022-01-08 DIAGNOSIS — J189 Pneumonia, unspecified organism: Secondary | ICD-10-CM | POA: Diagnosis present

## 2022-01-08 DIAGNOSIS — K219 Gastro-esophageal reflux disease without esophagitis: Secondary | ICD-10-CM | POA: Diagnosis present

## 2022-01-08 DIAGNOSIS — I10 Essential (primary) hypertension: Secondary | ICD-10-CM

## 2022-01-08 DIAGNOSIS — L89151 Pressure ulcer of sacral region, stage 1: Secondary | ICD-10-CM | POA: Diagnosis present

## 2022-01-08 DIAGNOSIS — Z96641 Presence of right artificial hip joint: Secondary | ICD-10-CM | POA: Diagnosis present

## 2022-01-08 DIAGNOSIS — Z20822 Contact with and (suspected) exposure to covid-19: Secondary | ICD-10-CM | POA: Diagnosis present

## 2022-01-08 DIAGNOSIS — E440 Moderate protein-calorie malnutrition: Secondary | ICD-10-CM | POA: Diagnosis present

## 2022-01-08 DIAGNOSIS — E785 Hyperlipidemia, unspecified: Secondary | ICD-10-CM | POA: Diagnosis present

## 2022-01-08 DIAGNOSIS — Z880 Allergy status to penicillin: Secondary | ICD-10-CM | POA: Diagnosis not present

## 2022-01-08 DIAGNOSIS — M48061 Spinal stenosis, lumbar region without neurogenic claudication: Secondary | ICD-10-CM | POA: Diagnosis present

## 2022-01-08 DIAGNOSIS — D472 Monoclonal gammopathy: Secondary | ICD-10-CM | POA: Diagnosis present

## 2022-01-08 LAB — COMPREHENSIVE METABOLIC PANEL
ALT: 130 U/L — ABNORMAL HIGH (ref 0–44)
AST: 153 U/L — ABNORMAL HIGH (ref 15–41)
Albumin: 2.5 g/dL — ABNORMAL LOW (ref 3.5–5.0)
Alkaline Phosphatase: 122 U/L (ref 38–126)
Anion gap: 8 (ref 5–15)
BUN: 31 mg/dL — ABNORMAL HIGH (ref 8–23)
CO2: 26 mmol/L (ref 22–32)
Calcium: 8.6 mg/dL — ABNORMAL LOW (ref 8.9–10.3)
Chloride: 103 mmol/L (ref 98–111)
Creatinine, Ser: 1.1 mg/dL (ref 0.61–1.24)
GFR, Estimated: >60 mL/min (ref >60)
Glucose, Bld: 119 mg/dL — ABNORMAL HIGH (ref 70–99)
Potassium: 4 mmol/L (ref 3.5–5.1)
Sodium: 137 mmol/L (ref 135–145)
Total Bilirubin: 0.6 mg/dL (ref 0.3–1.2)
Total Protein: 5.7 g/dL — ABNORMAL LOW (ref 6.5–8.1)

## 2022-01-08 LAB — HEPATITIS PANEL, ACUTE
HCV Ab: NONREACTIVE
Hep A IgM: NONREACTIVE
Hep B C IgM: NONREACTIVE
Hepatitis B Surface Ag: NONREACTIVE

## 2022-01-08 LAB — CBC
HCT: 32.4 % — ABNORMAL LOW (ref 39.0–52.0)
Hemoglobin: 10.5 g/dL — ABNORMAL LOW (ref 13.0–17.0)
MCH: 30.9 pg (ref 26.0–34.0)
MCHC: 32.4 g/dL (ref 30.0–36.0)
MCV: 95.3 fL (ref 80.0–100.0)
Platelets: 187 10*3/uL (ref 150–400)
RBC: 3.4 MIL/uL — ABNORMAL LOW (ref 4.22–5.81)
RDW: 14.1 % (ref 11.5–15.5)
WBC: 12 10*3/uL — ABNORMAL HIGH (ref 4.0–10.5)
nRBC: 0 % (ref 0.0–0.2)

## 2022-01-08 LAB — MRSA NEXT GEN BY PCR, NASAL: MRSA by PCR Next Gen: DETECTED — AB

## 2022-01-08 MED ORDER — DEXTROSE-NACL 5-0.45 % IV SOLN: INTRAVENOUS | Status: AC

## 2022-01-08 MED ORDER — GABAPENTIN 100 MG PO CAPS
100.0000 mg | ORAL_CAPSULE | Freq: Every day | ORAL | Status: DC
  Administered 2022-01-08: 100 mg via ORAL
  Filled 2022-01-08: qty 1

## 2022-01-08 MED ORDER — CHLORHEXIDINE GLUCONATE CLOTH 2 % EX PADS
6.0000 | MEDICATED_PAD | Freq: Every day | CUTANEOUS | Status: DC
  Administered 2022-01-08 – 2022-01-11 (×3): 6 via TOPICAL

## 2022-01-08 MED ORDER — METHOCARBAMOL 500 MG PO TABS
500.0000 mg | ORAL_TABLET | Freq: Four times a day (QID) | ORAL | Status: DC
  Administered 2022-01-08 – 2022-01-10 (×9): 500 mg via ORAL
  Filled 2022-01-08 (×9): qty 1

## 2022-01-08 MED ORDER — MUPIROCIN 2 % EX OINT
1.0000 "application " | TOPICAL_OINTMENT | Freq: Two times a day (BID) | CUTANEOUS | Status: DC
  Administered 2022-01-08 – 2022-01-11 (×7): 1 via NASAL
  Filled 2022-01-08: qty 22

## 2022-01-08 MED ORDER — TRAMADOL HCL 50 MG PO TABS
50.0000 mg | ORAL_TABLET | Freq: Four times a day (QID) | ORAL | Status: DC | PRN
  Administered 2022-01-08 – 2022-01-11 (×10): 50 mg via ORAL
  Filled 2022-01-08 (×10): qty 1

## 2022-01-08 NOTE — Plan of Care (Signed)
  Problem: Activity: Goal: Ability to tolerate increased activity will improve Outcome: Progressing   Problem: Pain Managment: Goal: General experience of comfort will improve Outcome: Progressing   Problem: Safety: Goal: Ability to remain free from injury will improve Outcome: Progressing   

## 2022-01-08 NOTE — Progress Notes (Signed)
[facility]  PROGRESS NOTE  [name] [name] KGY:[id number] DOB: Apr 12, 1941 DOA: 01/06/2022 PCP: Hoyt Koch, MD   Brief HPI:   81 year old male with history of AAA, chronic back pain, lumbar DDD, spinal stenosis, CAD, history of MI, CABG, RBBB, CAD, carotid bruit, thrombocytopenia, hypertension, MGUS came to hospital with leg pain due to fall from 3 stairs at home.  There was no loss of consciousness.  It was a mechanical fall.  In the ED CT lumbar spine without contrast showed osteopenia which limits the study and multilevel degenerative changes, no acute fracture or traumatic listhesis noted.  Chest x-ray showed subpleural patchy opacity in the lungs bilaterally just a mild infection or pneumonia.    Subjective   Patient still complains of back pain.   Assessment/Plan:    Back pain -CT lumbar spine showed multilevel degenerative changes with dextrocurvature of the lumbar spine and multilevel retrolisthesis -We will start Robaxin 500 mg p.o. 4 times daily, also add gabapentin 100 mg p.o. nightly -PT/OT eval ordered  Pneumonia -Patient started on ceftriaxone and Zithromax -Urinary strep pneumo antigen negative -O2 sats 92% on room air -Can switch to p.o. antibiotics if continues to do well  Transaminitis -Statin on hold -Alk phos is normal -AST/ALT is 153/130 today -Hepatitis panel obtained is negative -Abdominal ultrasound obtained today shows small incidental gallbladder polyps, possible early changes of cirrhosis  Hypertension -Blood pressure is stable -Continue Coreg, lisinopril  CAD -Continue aspirin, Coreg, statin  Carotid artery disease -Continue aspirin  BPH -Continue tamsulosin    Medications     aspirin EC  81 mg Oral Daily   carvedilol  12.5 mg Oral BID WC   Chlorhexidine Gluconate Cloth  6 each Topical Q0600   enoxaparin (LOVENOX) injection  40 mg Subcutaneous Q24H   feeding supplement  237 mL Oral BID BM   gabapentin  100 mg  Oral QHS   lisinopril  40 mg Oral Daily   magnesium oxide  400 mg Oral BID   methocarbamol  500 mg Oral QID   montelukast  10 mg Oral QPM   mupirocin ointment  1 application Nasal BID   pantoprazole  40 mg Oral Daily   polyethylene glycol  17 g Oral Daily   senna-docusate  2 tablet Oral BID   tamsulosin  0.4 mg Oral QPC breakfast     Data Reviewed:   CBG:  No results for input(s): GLUCAP in the last 168 hours.  SpO2: 92 %    Vitals:   01/08/22 0441 01/08/22 0916 01/08/22 1550 01/08/22 1603  BP: 120/62 (!) 106/59 (!) 104/59   Pulse: 75 68 86   Resp: 17 18  18   Temp: 97.8 F (36.6 C) 98.6 F (37 C) (!) 101 F (38.3 C) 100.3 F (37.9 C)  TempSrc: Oral Oral Oral Oral  SpO2: 99% 95% 92%   Weight:      Height:          Data Reviewed:  Basic Metabolic Panel: Recent Labs  Lab 01/06/22 2050 01/07/22 0921 01/08/22 0253  NA 132* 135 137  K 4.5 4.1 4.0  CL 99 103 103  CO2 25 24 26   GLUCOSE 127* 117* 119*  BUN 23 21 31*  CREATININE 0.91 0.87 1.10  CALCIUM 8.7* 8.5* 8.6*  PHOS  --  2.4*  --     CBC: Recent Labs  Lab 01/06/22 2050 01/07/22 0921 01/08/22 0253  WBC 12.9* 13.4* 12.0*  NEUTROABS 11.1*  --   --  HGB 11.5* 10.9* 10.5*  HCT 34.3* 33.2* 32.4*  MCV 94.8 93.8 95.3  PLT 182 172 187       Antibiotics: Anti-infectives (From admission, onward)    Start     Dose/Rate Route Frequency Ordered Stop   01/07/22 2200  cefTRIAXone (ROCEPHIN) 1 g in sodium chloride 0.9 % 100 mL IVPB        1 g 200 mL/hr over 30 Minutes Intravenous Every 24 hours 01/07/22 0841 01/12/22 2159   01/07/22 2200  azithromycin (ZITHROMAX) 500 mg in sodium chloride 0.9 % 250 mL IVPB        500 mg 250 mL/hr over 60 Minutes Intravenous Daily at bedtime 01/07/22 0841 01/12/22 2159   01/07/22 0045  cefTRIAXone (ROCEPHIN) 1 g in sodium chloride 0.9 % 100 mL IVPB        1 g 200 mL/hr over 30 Minutes Intravenous  Once 01/07/22 0040 01/07/22 0224   01/07/22 0045  azithromycin  (ZITHROMAX) 500 mg in sodium chloride 0.9 % 250 mL IVPB        500 mg 250 mL/hr over 60 Minutes Intravenous  Once 01/07/22 0040 01/07/22 0255        DVT prophylaxis: Lovenox  Code Status: Full code  Family Communication: No family at bedside      Objective    Physical Examination:   General-appears in no acute distress Heart-S1-S2, regular, no murmur auscultated Lungs-clear to auscultation bilaterally, no wheezing or crackles auscultated Abdomen-soft, nontender, no organomegaly Extremities-no edema in the lower extremities Neuro-alert, oriented x3, no focal deficit noted   Status is: Inpatient due to ongoing treatment for back pain    Pressure Injury 01/07/22 Coccyx Mid Stage 1 -  Intact skin with non-blanchable redness of a localized area usually over a bony prominence. (Active)  01/07/22 2030  Location: Coccyx  Location Orientation: Mid  Staging: Stage 1 -  Intact skin with non-blanchable redness of a localized area usually over a bony prominence.  Wound Description (Comments):   Present on Admission: Yes          [place]   [facility] If 7PM-7AM, please contact night-coverage at [email or web address], Office  [phone]   01/08/2022, 5:30 PM  LOS: 0 days

## 2022-01-08 NOTE — Evaluation (Signed)
Physical Therapy Evaluation Patient Details Name: [name] [name] MRN: [id number] DOB: 11-13-1941 Today's Date: 01/08/2022  History of Present Illness  81 y.o. male with medical history significant of AAA, chronic back pain, lumbar DDD, spinal stenosis, CAD, history of MI, CABG, RBBB, carotid artery disease, carotid bruits, cataracts, thrombocytopenia, GERD, hypertension, MGUS who is coming to the emergency department with complaints of back pain due to a fall.chest radiograph showed a faint subpleural patchy opacities in the lungs bilaterally, suggesting mild infection or pneumonia.  CT lumbar spine without contrast shows osteopenia which limits the study and multilevel degenerative changes.  No acute fracture seen.  No traumatic listhesis.  Clinical Impression  Pt admitted with above diagnosis.  Pt's mobility significantlylimited  by pain. Able to stand x3 with mod assist and take small lateral steps  along EOB with RW and mod assist. Pt with anterior LOB x1 while sitting d/t severe pain requiring max assist to prevent falling forward. Assisted pt back to bed, placed spinal stimulator charger belt on pt and assisted pt with setting it to run at level one.  Pt wears stimulator on a daily basis per pt and wife; RN notified that it was turned on and charging. Pt will likely need SNF post acute, if pain is more controlled mobility may quickly improve.  Pt currently with functional limitations due to the deficits listed below (see PT Problem List). Pt will benefit from skilled PT to increase their independence and safety with mobility to allow discharge to the venue listed below.          Recommendations for follow up therapy are one component of a multi-disciplinary discharge planning process, led by the attending physician.  Recommendations may be updated based on patient status, additional functional criteria and insurance authorization.  Follow Up Recommendations Skilled nursing-short term rehab (<3  hours/day)    Assistance Recommended at Discharge    Patient can return home with the following  Assist for transportation;Help with stairs or ramp for entrance;A lot of help with walking and/or transfers;A lot of help with bathing/dressing/bathroom    Equipment Recommendations None recommended by PT  Recommendations for Other Services       Functional Status Assessment Patient has had a recent decline in their functional status and demonstrates the ability to make significant improvements in function in a reasonable and predictable amount of time.     Precautions / Restrictions Precautions Precautions: Fall;Back Precaution Comments: back precautions for comfort Restrictions Weight Bearing Restrictions: No      Mobility  Bed Mobility Overal bed mobility: Needs Assistance Bed Mobility: Rolling, Sidelying to Sit, Sit to Supine Rolling: Min assist Sidelying to sit: Mod assist   Sit to supine: Mod assist, Max assist   General bed mobility comments: multi-modal cues for log roll technique, assist to complete roll, to lower legs and elevate trunk. incr time needed. assist to lift LEs on to bed d/t pain, assist to reposition in supine    Transfers Overall transfer level: Needs assistance Equipment used: Rolling walker (2 wheels)               General transfer comment: STS x3 from EOB. assist to rise and transition to RW, difficulty with trunk extension and full knee extension d/t pain    Ambulation/Gait Ambulation/Gait assistance: Mod assist, Min assist Gait Distance (Feet): 4 Feet Assistive device: Rolling walker (2 wheels)         General Gait Details: lateral steps along EOB with assist to balance and  maneuver RW  Stairs            Wheelchair Mobility    Modified Rankin (Stroke Patients Only)       Balance Overall balance assessment: Needs assistance Sitting-balance support: Bilateral upper extremity supported, Feet supported Sitting  balance-Leahy Scale: Poor Sitting balance - Comments: poor to fair. pt with anterior LOB d/t back pain requiring max assist to prevent fall;  anterior wt shift in sitting Postural control: Other (comment)                                   Pertinent Vitals/Pain Pain Assessment Pain Assessment: Faces Faces Pain Scale: Hurts even more Pain Location: back and stomach Pain Descriptors / Indicators: Sore, Throbbing Pain Intervention(s): Limited activity within patient's tolerance, Monitored during session, Repositioned    Home Living Family/patient expects to be discharged to:: Private residence Living Arrangements: Spouse/significant other Available Help at Discharge: Family Type of Home: House Home Access: Stairs to enter   Technical brewer of Steps: 2   Home Layout: One level Home Equipment: Conservation officer, nature (2 wheels);Cane - single point;Rollator (4 wheels)      Prior Function Prior Level of Function : Independent/Modified Independent             Mobility Comments: uses 2 wheeled RW in house ADLs Comments: reports independence     Hand Dominance        Extremity/Trunk Assessment   Upper Extremity Assessment Upper Extremity Assessment: Overall WFL for tasks assessed;Defer to OT evaluation    Lower Extremity Assessment Lower Extremity Assessment: RLE deficits/detail;LLE deficits/detail RLE Deficits / Details: at least 2+/5, limited by pain, AROM grossly WFL LLE Deficits / Details: at least 2+/5, limited by pain, AROM grossly WFL       Communication   Communication: HOH  Cognition Arousal/Alertness: Awake/alert Behavior During Therapy: WFL for tasks assessed/performed Overall Cognitive Status: Within Functional Limits for tasks assessed                                          General Comments      Exercises     Assessment/Plan    PT Assessment Patient needs continued PT services  PT Problem List Decreased  strength;Decreased mobility;Decreased activity tolerance;Decreased balance;Decreased knowledge of use of DME;Pain       PT Treatment Interventions DME instruction;Therapeutic exercise;Gait training;Balance training;Functional mobility training;Therapeutic activities;Patient/family education    PT Goals (Current goals can be found in the Care Plan section)  Acute Rehab PT Goals Patient Stated Goal: have less pain PT Goal Formulation: With patient Time For Goal Achievement: 01/22/22 Potential to Achieve Goals: Good    Frequency Min 2X/week     Co-evaluation               AM-PAC PT "6 Clicks" Mobility  Outcome Measure Help needed turning from your back to your side while in a flat bed without using bedrails?: A Little Help needed moving from lying on your back to sitting on the side of a flat bed without using bedrails?: A Lot Help needed moving to and from a bed to a chair (including a wheelchair)?: Total Help needed standing up from a chair using your arms (e.g., wheelchair or bedside chair)?: A Lot Help needed to walk in hospital room?: Total Help needed climbing 3-5 steps  with a railing? : Total 6 Click Score: 10    End of Session Equipment Utilized During Treatment: Gait belt   Patient left: with call bell/phone within reach;in bed;with bed alarm set   PT Visit Diagnosis: History of falling (Z91.81);Difficulty in walking, not elsewhere classified (R26.2);Muscle weakness (generalized) (M62.81);Other abnormalities of gait and mobility (R26.89);Pain Pain - part of body:  (back)    Time: 6580-0634 PT Time Calculation (min) (ACUTE ONLY): 32 min   Charges:   PT Evaluation $PT Eval Low Complexity: 1 Low PT Treatments $Therapeutic Activity: 8-22 mins        Baxter Flattery, PT  Acute Rehab Dept ([place]) [phone] Pager [phone]  01/08/2022   [facility] 01/08/2022, 5:12 PM

## 2022-01-08 NOTE — Progress Notes (Signed)
°  Transition of Care [facility]) Screening Note   Patient Details  Name: DENYM RAHIMI Date of Birth: Jul 22, 1941   Transition of Care [facility]) CM/SW Contact:    Lennart Pall, LCSW Phone Number: 01/08/2022, 10:29 AM    Transition of Care Department [facility]) has reviewed patient and no TOC needs have been identified at this time. We will continue to monitor patient advancement through interdisciplinary progression rounds. If new patient transition needs arise, please place a TOC consult.

## 2022-01-08 NOTE — Plan of Care (Signed)
°  Problem: Respiratory: Goal: Ability to maintain adequate ventilation will improve Outcome: Progressing   Problem: Respiratory: Goal: Ability to maintain a clear airway will improve Outcome: Progressing   Problem: Clinical Measurements: Goal: Ability to maintain clinical measurements within normal limits will improve Outcome: Progressing   Problem: Pain Managment: Goal: General experience of comfort will improve Outcome: Progressing   Problem: Safety: Goal: Ability to remain free from injury will improve Outcome: Progressing

## 2022-01-09 LAB — CBC
HCT: 34.5 % — ABNORMAL LOW (ref 39.0–52.0)
Hemoglobin: 11.3 g/dL — ABNORMAL LOW (ref 13.0–17.0)
MCH: 31 pg (ref 26.0–34.0)
MCHC: 32.8 g/dL (ref 30.0–36.0)
MCV: 94.5 fL (ref 80.0–100.0)
Platelets: 181 10*3/uL (ref 150–400)
RBC: 3.65 MIL/uL — ABNORMAL LOW (ref 4.22–5.81)
RDW: 14 % (ref 11.5–15.5)
WBC: 13.3 10*3/uL — ABNORMAL HIGH (ref 4.0–10.5)
nRBC: 0 % (ref 0.0–0.2)

## 2022-01-09 LAB — COMPREHENSIVE METABOLIC PANEL
ALT: 119 U/L — ABNORMAL HIGH (ref 0–44)
AST: 97 U/L — ABNORMAL HIGH (ref 15–41)
Albumin: 2.5 g/dL — ABNORMAL LOW (ref 3.5–5.0)
Alkaline Phosphatase: 134 U/L — ABNORMAL HIGH (ref 38–126)
Anion gap: 11 (ref 5–15)
BUN: 22 mg/dL (ref 8–23)
CO2: 22 mmol/L (ref 22–32)
Calcium: 8.2 mg/dL — ABNORMAL LOW (ref 8.9–10.3)
Chloride: 102 mmol/L (ref 98–111)
Creatinine, Ser: 0.81 mg/dL (ref 0.61–1.24)
GFR, Estimated: >60 mL/min (ref >60)
Glucose, Bld: 146 mg/dL — ABNORMAL HIGH (ref 70–99)
Potassium: 3.7 mmol/L (ref 3.5–5.1)
Sodium: 135 mmol/L (ref 135–145)
Total Bilirubin: 0.3 mg/dL (ref 0.3–1.2)
Total Protein: 5.8 g/dL — ABNORMAL LOW (ref 6.5–8.1)

## 2022-01-09 MED ORDER — NICOTINE 21 MG/24HR TD PT24
21.0000 mg | MEDICATED_PATCH | Freq: Every day | TRANSDERMAL | Status: DC
  Administered 2022-01-09 – 2022-01-11 (×3): 21 mg via TRANSDERMAL
  Filled 2022-01-09 (×3): qty 1

## 2022-01-09 MED ORDER — AZITHROMYCIN 250 MG PO TABS
500.0000 mg | ORAL_TABLET | Freq: Every day | ORAL | Status: DC
  Administered 2022-01-09 – 2022-01-10 (×2): 500 mg via ORAL
  Filled 2022-01-09 (×2): qty 2

## 2022-01-09 MED ORDER — GABAPENTIN 300 MG PO CAPS
300.0000 mg | ORAL_CAPSULE | Freq: Every day | ORAL | Status: DC
  Administered 2022-01-09 – 2022-01-10 (×2): 300 mg via ORAL
  Filled 2022-01-09 (×2): qty 1

## 2022-01-09 NOTE — NC FL2 (Signed)
[place] LEVEL OF CARE SCREENING TOOL     IDENTIFICATION  Patient Name: [name] [name] Birthdate: [birth year] Sex: male Admission Date (Current Location): 01/06/2022  [facility] and [place] Number:  Herbalist and Address:  [facility],  [address], [place]      Provider Number: [id number]  Attending Physician Name and Address:  Oswald Hillock, MD  Relative Name and Phone Number:  wife, Klye Besecker [phone]    Current Level of Care: Hospital Recommended Level of Care: [place] Prior Approval Number:    Date Approved/Denied:   PASRR Number: [id number]  Discharge Plan: SNF    Current Diagnoses: Patient Active Problem List   Diagnosis Date Noted   Pressure injury of skin 01/08/2022   Pneumonia 01/07/2022   Hypophosphatemia 01/07/2022   Transaminitis 01/07/2022   Moderate protein malnutrition ([place]) 01/07/2022   Constipation 10/30/2021   AAA (abdominal aortic aneurysm) 10/23/2021   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   Cough 04/01/2021   Abscess 11/14/2019   History of colonic polyps    Benign neoplasm of cecum    Benign neoplasm of ascending colon    Benign neoplasm of transverse colon    Benign neoplasm of sigmoid colon    Spinal stenosis 09/04/2016   AAA (abdominal aortic aneurysm) without rupture 06/23/2016   BPH (benign prostatic hyperplasia) 01/20/2015   Carotid artery disease (HCC)    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   Routine health maintenance 11/12/2011   RBBB (right bundle branch block with left anterior fascicular block)    Thrombocytopenia (HCC)    Hypertension    Dyslipidemia    CAD (coronary artery disease)    Hx of CABG    Back pain    TOBACCO ABUSE 01/01/2010   DYSPEPSIA 01/01/2010    Orientation RESPIRATION BLADDER Height & Weight     Self, Time, Situation, Place  Normal Incontinent, External catheter Weight: 163 lb 12.8 oz  (74.3 kg) Height:  5\' 10"  (177.8 cm)  BEHAVIORAL SYMPTOMS/MOOD NEUROLOGICAL BOWEL NUTRITION STATUS      Incontinent    AMBULATORY STATUS COMMUNICATION OF NEEDS Skin   Extensive Assist Verbally Other (Comment) (stage 1 coccyx)                       Personal Care Assistance Level of Assistance  Bathing, Dressing Bathing Assistance: Limited assistance   Dressing Assistance: Limited assistance     Functional Limitations Info             [place]  PT (By licensed PT), OT (By licensed OT)     PT Frequency: 5x/wk OT Frequency: 5x/wk            Contractures Contractures Info: Not present    Additional Factors Info  Code Status, Allergies Code Status Info: Full Allergies Info: Penicillins           Current Medications (01/09/2022):  This is the current hospital active medication list Current Facility-Administered Medications  Medication Dose Route Frequency Provider Last Rate Last Admin   acetaminophen (TYLENOL) tablet 650 mg  650 mg Oral Q6H PRN Reubin Milan, MD   650 mg at 01/09/22 1004   albuterol (PROVENTIL) (2.5 MG/3ML) 0.083% nebulizer solution 2.5 mg  2.5 mg Nebulization Q4H PRN Reubin Milan, MD       aspirin EC tablet 81 mg  81 mg Oral Daily Reubin Milan, MD  81 mg at 01/09/22 1004   azithromycin (ZITHROMAX) 500 mg in sodium chloride 0.9 % 250 mL IVPB  500 mg Intravenous QHS Reubin Milan, MD   Stopped at 01/09/22 0108   bisacodyl (DULCOLAX) suppository 10 mg  10 mg Rectal Daily PRN Reubin Milan, MD       carvedilol (COREG) tablet 12.5 mg  12.5 mg Oral BID WC Reubin Milan, MD   12.5 mg at 01/09/22 1004   cefTRIAXone (ROCEPHIN) 1 g in sodium chloride 0.9 % 100 mL IVPB  1 g Intravenous Q24H Reubin Milan, MD   Stopped at 01/08/22 2344   Chlorhexidine Gluconate Cloth 2 % PADS 6 each  6 each Topical Q0600 Oswald Hillock, MD   6 each at 01/09/22 1006   enoxaparin (LOVENOX) injection 40 mg  40 mg  Subcutaneous Q24H Reubin Milan, MD   40 mg at 01/09/22 1005   feeding supplement (ENSURE ENLIVE / ENSURE PLUS) liquid 237 mL  237 mL Oral BID BM Reubin Milan, MD   237 mL at 01/09/22 1032   gabapentin (NEURONTIN) capsule 100 mg  100 mg Oral QHS Oswald Hillock, MD   100 mg at 01/08/22 2315   ipratropium (ATROVENT) 0.03 % nasal spray 2 spray  2 spray Each Nare BID PRN Reubin Milan, MD       lisinopril (ZESTRIL) tablet 40 mg  40 mg Oral Daily Reubin Milan, MD   40 mg at 01/09/22 1004   methocarbamol (ROBAXIN) tablet 500 mg  500 mg Oral QID Oswald Hillock, MD   500 mg at 01/09/22 1004   montelukast (SINGULAIR) tablet 10 mg  10 mg Oral QPM Reubin Milan, MD   10 mg at 01/08/22 1722   mupirocin ointment (BACTROBAN) 2 % 1 application  1 application Nasal BID Oswald Hillock, MD   1 application at 96/29/52 1032   nicotine (NICODERM CQ - dosed in mg/24 hours) patch 21 mg  21 mg Transdermal Daily Oswald Hillock, MD   21 mg at 01/09/22 1004   ondansetron (ZOFRAN) tablet 4 mg  4 mg Oral Q6H PRN Reubin Milan, MD       Or   ondansetron [facility]) injection 4 mg  4 mg Intravenous Q6H PRN Reubin Milan, MD       pantoprazole (PROTONIX) EC tablet 40 mg  40 mg Oral Daily Reubin Milan, MD   40 mg at 01/09/22 1004   polyethylene glycol (MIRALAX / GLYCOLAX) packet 17 g  17 g Oral Daily Reubin Milan, MD       tamsulosin [facility]) capsule 0.4 mg  0.4 mg Oral QPC breakfast Reubin Milan, MD   0.4 mg at 01/09/22 1004   traMADol (ULTRAM) tablet 50 mg  50 mg Oral Q6H PRN Oswald Hillock, MD   50 mg at 01/09/22 1004     Discharge Medications: Please see discharge summary for a list of discharge medications.  Relevant Imaging Results:  Relevant Lab Results:   Additional Information SS# [id number]    COVID vaccine x 3  Joss Mcdill, LCSW

## 2022-01-09 NOTE — Progress Notes (Signed)
PHARMACIST - PHYSICIAN COMMUNICATION DR:   Darrick Meigs CONCERNING: Antibiotic IV to Oral Route Change Policy  RECOMMENDATION: This patient is receiving azithromycin by the intravenous route.  Based on criteria approved by the Pharmacy and Therapeutics Committee, the antibiotic(s) is/are being converted to the equivalent oral dose form(s).   DESCRIPTION: These criteria include: Patient being treated for a respiratory tract infection, urinary tract infection, cellulitis or clostridium difficile associated diarrhea if on metronidazole The patient is not neutropenic and does not exhibit a GI malabsorption state The patient is eating (either orally or via tube) and/or has been taking other orally administered medications for a least 24 hours The patient is improving clinically and has a Tmax < 100.5  If you have questions about this conversion, please contact the Pharmacy Department  []   [phone] )  [facility] []   [phone] )  [facility] []   [phone] )  [facility] []   [phone] )  [facility] [x]   [phone] )  [place], PharmD, BCPS 01/09/2022 12:31 PM

## 2022-01-09 NOTE — Progress Notes (Signed)
[facility]  PROGRESS NOTE  [name] [name] [name]:[id number] DOB: [birth year] DOA: 01/06/2022 PCP: Hoyt Koch, MD   Brief HPI:   81 year old male with history of AAA, chronic back pain, lumbar DDD, spinal stenosis, CAD, history of MI, CABG, RBBB, CAD, carotid bruit, thrombocytopenia, hypertension, MGUS came to hospital with leg pain due to fall from 3 stairs at home.  There was no loss of consciousness.  It was a mechanical fall.  In the ED CT lumbar spine without contrast showed osteopenia which limits the study and multilevel degenerative changes, no acute fracture or traumatic listhesis noted.  Chest x-ray showed subpleural patchy opacity in the lungs bilaterally just a mild infection or pneumonia.    Subjective   Patient seen and examined, still complains of back pain.   Assessment/Plan:    Back pain -CT lumbar spine showed multilevel degenerative changes with dextrocurvature of the lumbar spine and multilevel retrolisthesis -Patient was started on Robaxin 500 mg p.o. 4 times daily, also added gabapentin 100 mg p.o. nightly -Will increase dose of gabapentin to 300 mg p.o. daily at bedtime -PT/OT eval ordered, recommend skilled nursing facility  Pneumonia -Patient started on ceftriaxone and Zithromax -Urinary strep pneumo antigen negative -O2 sats 92% on room air -Can switch to p.o. antibiotics if continues to do well  Transaminitis -Statin on hold -Alk phos is normal -AST/ALT is 153/130 today -Hepatitis panel obtained is negative -Abdominal ultrasound obtained today shows small incidental gallbladder polyps, possible early changes of cirrhosis -He will need follow-up abdominal ultrasound in 6 months as outpatient  Hypertension -Blood pressure is stable -Continue Coreg, lisinopril  CAD -Continue aspirin, Coreg, statin  Carotid artery disease -Continue aspirin  BPH -Continue tamsulosin    Medications     aspirin EC  81 mg Oral Daily    azithromycin  500 mg Oral QHS   carvedilol  12.5 mg Oral BID WC   Chlorhexidine Gluconate Cloth  6 each Topical Q0600   enoxaparin (LOVENOX) injection  40 mg Subcutaneous Q24H   feeding supplement  237 mL Oral BID BM   gabapentin  100 mg Oral QHS   lisinopril  40 mg Oral Daily   methocarbamol  500 mg Oral QID   montelukast  10 mg Oral QPM   mupirocin ointment  1 application Nasal BID   nicotine  21 mg Transdermal Daily   pantoprazole  40 mg Oral Daily   polyethylene glycol  17 g Oral Daily   tamsulosin  0.4 mg Oral QPC breakfast     Data Reviewed:   CBG:  No results for input(s): GLUCAP in the last 168 hours.  SpO2: 96 %    Vitals:   01/08/22 1603 01/08/22 2200 01/09/22 0701 01/09/22 1331  BP:  99/60 117/61 (!) 105/59  Pulse:  89 80 66  Resp: 18 17 18 16   Temp: 100.3 F (37.9 C) 99.8 F (37.7 C) 98.3 F (36.8 C) 98 F (36.7 C)  TempSrc: Oral Oral    SpO2:  96% 96% 96%  Weight:      Height:          Data Reviewed:  Basic Metabolic Panel: Recent Labs  Lab 01/06/22 2050 01/07/22 0921 01/08/22 0253 01/09/22 0316  NA 132* 135 137 135  K 4.5 4.1 4.0 3.7  CL 99 103 103 102  CO2 25 24 26 22   GLUCOSE 127* 117* 119* 146*  BUN 23 21 31* 22  CREATININE 0.91 0.87 1.10 0.81  CALCIUM 8.7* 8.5*  8.6* 8.2*  PHOS  --  2.4*  --   --     CBC: Recent Labs  Lab 01/06/22 2050 01/07/22 0921 01/08/22 0253 01/09/22 0316  WBC 12.9* 13.4* 12.0* 13.3*  NEUTROABS 11.1*  --   --   --   HGB 11.5* 10.9* 10.5* 11.3*  HCT 34.3* 33.2* 32.4* 34.5*  MCV 94.8 93.8 95.3 94.5  PLT 182 172 187 181       Antibiotics: Anti-infectives (From admission, onward)    Start     Dose/Rate Route Frequency Ordered Stop   01/09/22 2200  azithromycin (ZITHROMAX) tablet 500 mg        500 mg Oral Daily at bedtime 01/09/22 1231 01/12/22 2159   01/07/22 2200  cefTRIAXone (ROCEPHIN) 1 g in sodium chloride 0.9 % 100 mL IVPB        1 g 200 mL/hr over 30 Minutes Intravenous Every 24 hours  01/07/22 0841 01/12/22 2159   01/07/22 2200  azithromycin (ZITHROMAX) 500 mg in sodium chloride 0.9 % 250 mL IVPB  Status:  Discontinued        500 mg 250 mL/hr over 60 Minutes Intravenous Daily at bedtime 01/07/22 0841 01/09/22 1231   01/07/22 0045  cefTRIAXone (ROCEPHIN) 1 g in sodium chloride 0.9 % 100 mL IVPB        1 g 200 mL/hr over 30 Minutes Intravenous  Once 01/07/22 0040 01/07/22 0224   01/07/22 0045  azithromycin (ZITHROMAX) 500 mg in sodium chloride 0.9 % 250 mL IVPB        500 mg 250 mL/hr over 60 Minutes Intravenous  Once 01/07/22 0040 01/07/22 0255        DVT prophylaxis: Lovenox  Code Status: Full code  Family Communication: Discussed with patient wife at bedside      Objective    Physical Examination:   General-appears in no acute distress Heart-S1-S2, regular, no murmur auscultated Lungs-clear to auscultation bilaterally, no wheezing or crackles auscultated Abdomen-soft, nontender, no organomegaly Extremities-no edema in the lower extremities Neuro-alert, oriented x3, no focal deficit noted   Status is: Inpatient due to ongoing treatment for back pain    Pressure Injury 01/07/22 Coccyx Mid Stage 1 -  Intact skin with non-blanchable redness of a localized area usually over a bony prominence. (Active)  01/07/22 2030  Location: Coccyx  Location Orientation: Mid  Staging: Stage 1 -  Intact skin with non-blanchable redness of a localized area usually over a bony prominence.  Wound Description (Comments):   Present on Admission: Yes          [place]   [facility] If 7PM-7AM, please contact night-coverage at [email or web address], Office  [phone]   01/09/2022, 4:16 PM  LOS: 1 day

## 2022-01-09 NOTE — TOC Initial Note (Signed)
Transition of Care [facility]) - Initial/Assessment Note    Patient Details  Name: [name] [name] MRN: [id number] Date of Birth: 1941/08/03  Transition of Care [facility]) CM/SW Contact:    Lennart Pall, LCSW Phone Number: 01/09/2022, 11:01 AM  Clinical Narrative:                 Met with pt and spouse today to introduce self/ TOC role with dc planning.  Pt lying in bed and reports continued pain and feeling "worn out".  Spoke with both about therapy recommendation for SNF rehab unless significant functional gain occurs.  They are agreed and wife feels she is unable to physically meet his current care needs.  They are agreeable with TOC to begin SNF bed search.  Request [facility] if possible as it is close to their home.    Expected Discharge Plan: Skilled Nursing Facility Barriers to Discharge: Continued Medical Work up, SNF Pending bed offer, Insurance Authorization   Patient Goals and CMS Choice Patient states their goals for this hospitalization and ongoing recovery are:: to return home if improves, however, agreeable with SNF rehab      Expected Discharge Plan and Services Expected Discharge Plan: [place] In-house Referral: Clinical Social Work     Living arrangements for the past 2 months: Single Family Home                                      Prior Living Arrangements/Services Living arrangements for the past 2 months: Single Family Home Lives with:: Spouse Patient language and need for interpreter reviewed:: Yes Do you feel safe going back to the place where you live?: Yes      Need for Family Participation in Patient Care: Yes (Comment) Care giver support system in place?: Yes (comment)   Criminal Activity/Legal Involvement Pertinent to Current Situation/Hospitalization: No - Comment as needed  Activities of Daily Living Home Assistive Devices/Equipment: Cane (specify quad or straight), Walker (specify type) ADL Screening (condition at time of  admission) Patient's cognitive ability adequate to safely complete daily activities?: Yes Is the patient deaf or have difficulty hearing?: Yes Does the patient have difficulty seeing, even when wearing glasses/contacts?: No Does the patient have difficulty concentrating, remembering, or making decisions?: Yes Patient able to express need for assistance with ADLs?: Yes Does the patient have difficulty dressing or bathing?: Yes Independently performs ADLs?: No Communication: Independent Dressing (OT): Needs assistance Is this a change from baseline?: Change from baseline, expected to last >3 days Grooming: Independent Feeding: Independent Bathing: Needs assistance Is this a change from baseline?: Pre-admission baseline Toileting: Needs assistance Is this a change from baseline?: Change from baseline, expected to last >3days In/Out Bed: Needs assistance Is this a change from baseline?: Change from baseline, expected to last >3 days Walks in Home: Needs assistance Is this a change from baseline?: Change from baseline, expected to last >3 days Does the patient have difficulty walking or climbing stairs?: Yes Weakness of Legs: Both Weakness of Arms/Hands: None  Permission Sought/Granted Permission sought to share information with : Family Supports Permission granted to share information with : Yes, Verbal Permission Granted  Share Information with NAME: Martrell Eguia     Permission granted to share info w Relationship: spouse  Permission granted to share info w Contact Information: [phone]  Emotional Assessment Appearance:: Appears stated age Attitude/Demeanor/Rapport: Gracious Affect (typically observed): Accepting, Quiet Orientation: :  Oriented to Self, Oriented to Place, Oriented to  Time, Oriented to Situation Alcohol / Substance Use: Not Applicable Psych Involvement: No (comment)  Admission diagnosis:  Pneumonia [J18.9] Fever [R50.9] Acute midline low back pain without  sciatica [M54.50] Multifocal pneumonia [J18.9] Patient Active Problem List   Diagnosis Date Noted   Pressure injury of skin 01/08/2022   Pneumonia 01/07/2022   Hypophosphatemia 01/07/2022   Transaminitis 01/07/2022   Moderate protein malnutrition ([place]) 01/07/2022   Constipation 10/30/2021   AAA (abdominal aortic aneurysm) 10/23/2021   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   Cough 04/01/2021   Abscess 11/14/2019   History of colonic polyps    Benign neoplasm of cecum    Benign neoplasm of ascending colon    Benign neoplasm of transverse colon    Benign neoplasm of sigmoid colon    Spinal stenosis 09/04/2016   AAA (abdominal aortic aneurysm) without rupture 06/23/2016   BPH (benign prostatic hyperplasia) 01/20/2015   Carotid artery disease (HCC)    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   Routine health maintenance 11/12/2011   RBBB (right bundle branch block with left anterior fascicular block)    Thrombocytopenia (HCC)    Hypertension    Dyslipidemia    CAD (coronary artery disease)    Hx of CABG    Back pain    TOBACCO ABUSE 01/01/2010   DYSPEPSIA 01/01/2010   PCP:  Hoyt Koch, MD Pharmacy:   [id number] [id number]: [phone]: [phone]    Social Determinants of Health (SDOH) Interventions    Readmission Risk Interventions Readmission Risk Prevention Plan 01/09/2022  Post Dischage Appt Complete  Medication Screening Complete  Transportation Screening Complete  Some recent data might be hidden

## 2022-01-10 ENCOUNTER — Inpatient Hospital Stay (HOSPITAL_COMMUNITY): Payer: PPO

## 2022-01-10 DIAGNOSIS — R1319 Other dysphagia: Secondary | ICD-10-CM

## 2022-01-10 MED ORDER — OXYCODONE HCL 5 MG PO TABS
5.0000 mg | ORAL_TABLET | Freq: Once | ORAL | Status: AC
  Administered 2022-01-10: 5 mg via ORAL
  Filled 2022-01-10: qty 1

## 2022-01-10 MED ORDER — HYDROCODONE-ACETAMINOPHEN 10-325 MG PO TABS
1.0000 | ORAL_TABLET | Freq: Four times a day (QID) | ORAL | Status: DC | PRN
  Administered 2022-01-10 – 2022-01-11 (×3): 1 via ORAL
  Filled 2022-01-10 (×3): qty 1

## 2022-01-10 MED ORDER — KETOROLAC TROMETHAMINE 30 MG/ML IJ SOLN
30.0000 mg | Freq: Four times a day (QID) | INTRAMUSCULAR | Status: DC
  Administered 2022-01-10 – 2022-01-11 (×2): 30 mg via INTRAVENOUS
  Filled 2022-01-10 (×3): qty 1

## 2022-01-10 MED ORDER — METHOCARBAMOL 500 MG PO TABS: 500.0000 mg | ORAL_TABLET | Freq: Three times a day (TID) | ORAL | Status: DC | PRN

## 2022-01-10 MED ORDER — METHOCARBAMOL 500 MG PO TABS
500.0000 mg | ORAL_TABLET | Freq: Four times a day (QID) | ORAL | Status: DC | PRN
  Administered 2022-01-10 – 2022-01-11 (×3): 500 mg via ORAL
  Filled 2022-01-10 (×4): qty 1

## 2022-01-10 NOTE — Progress Notes (Signed)
[facility]  PROGRESS NOTE  [name] [name] PYY:[id number] DOB: 1941/01/12 DOA: 01/06/2022 PCP: Hoyt Koch, MD   Brief HPI:   81 year old male with history of AAA, chronic back pain, lumbar DDD, spinal stenosis, CAD, history of MI, CABG, RBBB, CAD, carotid bruit, thrombocytopenia, hypertension, MGUS came to hospital with leg pain due to fall from 3 stairs at home.  There was no loss of consciousness.  It was a mechanical fall.  In the ED CT lumbar spine without contrast showed osteopenia which limits the study and multilevel degenerative changes, no acute fracture or traumatic listhesis noted.  Chest x-ray showed subpleural patchy opacity in the lungs bilaterally just a mild infection or pneumonia.    Subjective   Patient seen and examined, back pain has improved.  Complains of swallowing difficulty with food stuck in the middle of chest when he tries to swallow.  He denies any difficulty swallowing in the upper pharynx.   Assessment/Plan:    Back pain -CT lumbar spine showed multilevel degenerative changes with dextrocurvature of the lumbar spine and multilevel retrolisthesis -Patient was started on Robaxin 500 mg p.o. 4 times daily, also added gabapentin 100 mg p.o. nightly -Will increase dose of gabapentin to 300 mg p.o. daily at bedtime -PT/OT eval ordered, recommend skilled nursing facility  Pneumonia -Patient started on ceftriaxone and Zithromax -Last date of antibiotics 01/12/2022 -Urinary strep pneumo antigen negative -O2 sats 92% on room air  Dysphagia -Patient complains of difficulty swallowing, with feeling like pills stuck in the middle of chest -We will obtain barium swallow/esophagram  Transaminitis -Statin on hold -Alk phos is normal -AST/ALT is 153/130 today -Hepatitis panel obtained is negative -Abdominal ultrasound obtained today shows small incidental gallbladder polyps, possible early changes of cirrhosis -He will need follow-up abdominal  ultrasound in 6 months as outpatient  Hypertension -Blood pressure is stable -Continue Coreg, lisinopril  CAD -Continue aspirin, Coreg, statin  Carotid artery disease -Continue aspirin  BPH -Continue tamsulosin    Medications     aspirin EC  81 mg Oral Daily   azithromycin  500 mg Oral QHS   carvedilol  12.5 mg Oral BID WC   Chlorhexidine Gluconate Cloth  6 each Topical Q0600   enoxaparin (LOVENOX) injection  40 mg Subcutaneous Q24H   feeding supplement  237 mL Oral BID BM   gabapentin  300 mg Oral QHS   lisinopril  40 mg Oral Daily   methocarbamol  500 mg Oral QID   montelukast  10 mg Oral QPM   mupirocin ointment  1 application Nasal BID   nicotine  21 mg Transdermal Daily   pantoprazole  40 mg Oral Daily   polyethylene glycol  17 g Oral Daily   tamsulosin  0.4 mg Oral QPC breakfast     Data Reviewed:   CBG:  No results for input(s): GLUCAP in the last 168 hours.  SpO2: 93 %    Vitals:   01/09/22 0701 01/09/22 1331 01/09/22 2019 01/10/22 0539  BP: 117/61 (!) 105/59 (!) 101/55 130/71  Pulse: 80 66 69 76  Resp: 18 16 17 17   Temp: 98.3 F (36.8 C) 98 F (36.7 C) 98.9 F (37.2 C) 98.6 F (37 C)  TempSrc:   Oral Oral  SpO2: 96% 96% 95% 93%  Weight:      Height:          Data Reviewed:  Basic Metabolic Panel: Recent Labs  Lab 01/06/22 2050 01/07/22 3567 01/08/22 0253 01/09/22 0316  NA 132* 135 137 135  K 4.5 4.1 4.0 3.7  CL 99 103 103 102  CO2 25 24 26 22   GLUCOSE 127* 117* 119* 146*  BUN 23 21 31* 22  CREATININE 0.91 0.87 1.10 0.81  CALCIUM 8.7* 8.5* 8.6* 8.2*  PHOS  --  2.4*  --   --     CBC: Recent Labs  Lab 01/06/22 2050 01/07/22 0921 01/08/22 0253 01/09/22 0316  WBC 12.9* 13.4* 12.0* 13.3*  NEUTROABS 11.1*  --   --   --   HGB 11.5* 10.9* 10.5* 11.3*  HCT 34.3* 33.2* 32.4* 34.5*  MCV 94.8 93.8 95.3 94.5  PLT 182 172 187 181       Antibiotics: Anti-infectives (From admission, onward)    Start     Dose/Rate Route  Frequency Ordered Stop   01/09/22 2200  azithromycin (ZITHROMAX) tablet 500 mg        500 mg Oral Daily at bedtime 01/09/22 1231 01/12/22 2159   01/07/22 2200  cefTRIAXone (ROCEPHIN) 1 g in sodium chloride 0.9 % 100 mL IVPB        1 g 200 mL/hr over 30 Minutes Intravenous Every 24 hours 01/07/22 0841 01/12/22 2159   01/07/22 2200  azithromycin (ZITHROMAX) 500 mg in sodium chloride 0.9 % 250 mL IVPB  Status:  Discontinued        500 mg 250 mL/hr over 60 Minutes Intravenous Daily at bedtime 01/07/22 0841 01/09/22 1231   01/07/22 0045  cefTRIAXone (ROCEPHIN) 1 g in sodium chloride 0.9 % 100 mL IVPB        1 g 200 mL/hr over 30 Minutes Intravenous  Once 01/07/22 0040 01/07/22 0224   01/07/22 0045  azithromycin (ZITHROMAX) 500 mg in sodium chloride 0.9 % 250 mL IVPB        500 mg 250 mL/hr over 60 Minutes Intravenous  Once 01/07/22 0040 01/07/22 0255        DVT prophylaxis: Lovenox  Code Status: Full code  Family Communication: Discussed with patient wife at bedside      Objective    Physical Examination:   General-appears in no acute distress Heart-S1-S2, regular, no murmur auscultated Lungs-clear to auscultation bilaterally, no wheezing or crackles auscultated Abdomen-soft, nontender, no organomegaly Extremities-no edema in the lower extremities Neuro-alert, oriented x3, no focal deficit noted   Status is: Inpatient due to ongoing treatment for back pain    Pressure Injury 01/07/22 Coccyx Mid Stage 1 -  Intact skin with non-blanchable redness of a localized area usually over a bony prominence. (Active)  01/07/22 2030  Location: Coccyx  Location Orientation: Mid  Staging: Stage 1 -  Intact skin with non-blanchable redness of a localized area usually over a bony prominence.  Wound Description (Comments):   Present on Admission: Yes          [place]   [facility] If 7PM-7AM, please contact night-coverage at [email or web address], Office   [phone]   01/10/2022, 10:50 AM  LOS: 2 days

## 2022-01-10 NOTE — Plan of Care (Signed)
Plan of care reviewed and discussed. °

## 2022-01-10 NOTE — TOC Progression Note (Addendum)
Transition of Care [facility]) - Progression Note    Patient Details  Name: NICOLAUS ANDEL MRN: [id number] Date of Birth: Aug 06, 1941  Transition [facility]) CM/SW Contact  Lennart Pall, LCSW Phone Number: 01/10/2022, 1:09 PM  Clinical Narrative:    Have reviewed SNF bed offers with pt/ wife and they have accepted bed at [facility].  Per MD, pt may be medically ready for dc over weekend.  Have begun insurance auth with [employer] for SNF and ambulance transport. Have alerted MD that will need a rapid COVID test done prior to dc.  Addendum: Have received insurance auth for SNF [phone]) and [facility] (# [id number]).    Expected Discharge Plan: Skilled Nursing Facility Barriers to Discharge: Continued Medical Work up, SNF Pending bed offer, Ship broker  Expected Discharge Plan and Services Expected Discharge Plan: [place] In-house Referral: Clinical Social Work     Living arrangements for the past 2 months: Single Family Home                                       Social Determinants of Health (SDOH) Interventions    Readmission Risk Interventions Readmission Risk Prevention Plan 01/09/2022  Post Dischage Appt Complete  Medication Screening Complete  Transportation Screening Complete  Some recent data might be hidden

## 2022-01-10 NOTE — Progress Notes (Signed)
Physical Therapy Treatment Patient Details Name: [name] [name] MRN: [id number] DOB: [birth year] Today's Date: 01/10/2022   History of Present Illness 81 y.o. male with medical history significant of AAA, chronic back pain, lumbar DDD, spinal stenosis, CAD, history of MI, CABG, RBBB, carotid artery disease, carotid bruits, cataracts, thrombocytopenia, GERD, hypertension, MGUS who is coming to the emergency department with complaints of back pain due to a fall.chest radiograph showed a faint subpleural patchy opacities in the lungs bilaterally, suggesting mild infection or pneumonia.  CT lumbar spine without contrast shows osteopenia which limits the study and multilevel degenerative changes.  No acute fracture seen.  No traumatic listhesis.    PT Comments    Pt agreeable to attempt mobility however limited by pain.  Pt performed 3 sit to stands and performed lateral side steps up [facility] for repositioning however pt felt unable to remain OOB or ambulate today due to pain.  Continue to recommend SNF upon d/c as pt requiring at least mod assist.    Recommendations for follow up therapy are one component of a multi-disciplinary discharge planning process, led by the attending physician.  Recommendations may be updated based on patient status, additional functional criteria and insurance authorization.  Follow Up Recommendations  Skilled nursing-short term rehab (<3 hours/day)     Assistance Recommended at Discharge Frequent or constant Supervision/Assistance  Patient can return home with the following Assist for transportation;Help with stairs or ramp for entrance;A lot of help with walking and/or transfers;A lot of help with bathing/dressing/bathroom   Equipment Recommendations  None recommended by PT    Recommendations for Other Services       Precautions / Restrictions Precautions Precautions: Fall;Back Precaution Comments: back precautions for comfort     Mobility  Bed Mobility Overal  bed mobility: Needs Assistance Bed Mobility: Rolling, Sidelying to Sit, Sit to Sidelying Rolling: Min guard Sidelying to sit: Mod assist     Sit to sidelying: Mod assist General bed mobility comments: multi-modal cues for log roll technique, assist to bring trunk upright and then LEs onto bed    Transfers Overall transfer level: Needs assistance Equipment used: Rolling walker (2 wheels) Transfers: Sit to/from Stand             General transfer comment: assist to rise and transition to RW, difficulty with trunk extension d/t pain; performed x3 for strengthening; pt unable to hold standing longer then 30 seconds    Ambulation/Gait Ambulation/Gait assistance: Min assist, Mod assist Gait Distance (Feet): 2 Feet Assistive device: Rolling walker (2 wheels)         General Gait Details: lateral steps along EOB with assist to balance and maneuver RW   Stairs             Wheelchair Mobility    Modified Rankin (Stroke Patients Only)       Balance                                            Cognition Arousal/Alertness: Awake/alert Behavior During Therapy: WFL for tasks assessed/performed Overall Cognitive Status: Within Functional Limits for tasks assessed                                          Exercises      General Comments  Pertinent Vitals/Pain Pain Assessment Pain Assessment: Faces Faces Pain Scale: Hurts even more Pain Location: back Pain Descriptors / Indicators: Sore Pain Intervention(s): Monitored during session, Premedicated before session, Repositioned    Home Living                          Prior Function            PT Goals (current goals can now be found in the care plan section) Progress towards PT goals: Progressing toward goals    Frequency    Min 2X/week      PT Plan Current plan remains appropriate    Co-evaluation              AM-PAC PT "6 Clicks"  Mobility   Outcome Measure  Help needed turning from your back to your side while in a flat bed without using bedrails?: A Little Help needed moving from lying on your back to sitting on the side of a flat bed without using bedrails?: A Lot Help needed moving to and from a bed to a chair (including a wheelchair)?: A Lot Help needed standing up from a chair using your arms (e.g., wheelchair or bedside chair)?: A Lot Help needed to walk in hospital room?: Total Help needed climbing 3-5 steps with a railing? : Total 6 Click Score: 11    End of Session Equipment Utilized During Treatment: Gait belt Activity Tolerance: Patient limited by pain Patient left: in bed;with call bell/phone within reach;with bed alarm set   PT Visit Diagnosis: History of falling (Z91.81);Difficulty in walking, not elsewhere classified (R26.2);Muscle weakness (generalized) (M62.81);Pain Pain - part of body:  (back)     Time: 1051-1106 PT Time Calculation (min) (ACUTE ONLY): 15 min  Charges:  $Therapeutic Activity: 8-22 mins                     Jannette Spanner PT, DPT Acute Rehabilitation Services Pager: [phone] Office: [place] 01/10/2022, 2:19 PM

## 2022-01-11 DIAGNOSIS — M961 Postlaminectomy syndrome, not elsewhere classified: Secondary | ICD-10-CM | POA: Diagnosis not present

## 2022-01-11 DIAGNOSIS — N400 Enlarged prostate without lower urinary tract symptoms: Secondary | ICD-10-CM | POA: Diagnosis not present

## 2022-01-11 DIAGNOSIS — Z7901 Long term (current) use of anticoagulants: Secondary | ICD-10-CM | POA: Diagnosis not present

## 2022-01-11 DIAGNOSIS — R1310 Dysphagia, unspecified: Secondary | ICD-10-CM | POA: Diagnosis not present

## 2022-01-11 DIAGNOSIS — R1319 Other dysphagia: Secondary | ICD-10-CM | POA: Diagnosis not present

## 2022-01-11 DIAGNOSIS — M5416 Radiculopathy, lumbar region: Secondary | ICD-10-CM | POA: Diagnosis not present

## 2022-01-11 DIAGNOSIS — Z7401 Bed confinement status: Secondary | ICD-10-CM | POA: Diagnosis not present

## 2022-01-11 DIAGNOSIS — I2510 Atherosclerotic heart disease of native coronary artery without angina pectoris: Secondary | ICD-10-CM | POA: Diagnosis not present

## 2022-01-11 DIAGNOSIS — M4800 Spinal stenosis, site unspecified: Secondary | ICD-10-CM | POA: Diagnosis not present

## 2022-01-11 DIAGNOSIS — M549 Dorsalgia, unspecified: Secondary | ICD-10-CM | POA: Diagnosis not present

## 2022-01-11 DIAGNOSIS — M5136 Other intervertebral disc degeneration, lumbar region: Secondary | ICD-10-CM | POA: Diagnosis not present

## 2022-01-11 DIAGNOSIS — I10 Essential (primary) hypertension: Secondary | ICD-10-CM | POA: Diagnosis not present

## 2022-01-11 DIAGNOSIS — I714 Abdominal aortic aneurysm, without rupture, unspecified: Secondary | ICD-10-CM | POA: Diagnosis not present

## 2022-01-11 DIAGNOSIS — G894 Chronic pain syndrome: Secondary | ICD-10-CM | POA: Diagnosis not present

## 2022-01-11 DIAGNOSIS — R1313 Dysphagia, pharyngeal phase: Secondary | ICD-10-CM | POA: Diagnosis not present

## 2022-01-11 DIAGNOSIS — Z79899 Other long term (current) drug therapy: Secondary | ICD-10-CM | POA: Diagnosis not present

## 2022-01-11 DIAGNOSIS — M545 Low back pain, unspecified: Secondary | ICD-10-CM | POA: Diagnosis not present

## 2022-01-11 DIAGNOSIS — J189 Pneumonia, unspecified organism: Secondary | ICD-10-CM | POA: Diagnosis not present

## 2022-01-11 DIAGNOSIS — F32A Depression, unspecified: Secondary | ICD-10-CM | POA: Diagnosis not present

## 2022-01-11 DIAGNOSIS — T17320A Food in larynx causing asphyxiation, initial encounter: Secondary | ICD-10-CM | POA: Diagnosis not present

## 2022-01-11 DIAGNOSIS — Z9181 History of falling: Secondary | ICD-10-CM | POA: Diagnosis not present

## 2022-01-11 DIAGNOSIS — Z9682 Presence of neurostimulator: Secondary | ICD-10-CM | POA: Diagnosis not present

## 2022-01-11 DIAGNOSIS — I779 Disorder of arteries and arterioles, unspecified: Secondary | ICD-10-CM | POA: Diagnosis not present

## 2022-01-11 DIAGNOSIS — E440 Moderate protein-calorie malnutrition: Secondary | ICD-10-CM | POA: Diagnosis not present

## 2022-01-11 DIAGNOSIS — M5459 Other low back pain: Secondary | ICD-10-CM | POA: Diagnosis not present

## 2022-01-11 DIAGNOSIS — E441 Mild protein-calorie malnutrition: Secondary | ICD-10-CM | POA: Diagnosis not present

## 2022-01-11 DIAGNOSIS — W19XXXD Unspecified fall, subsequent encounter: Secondary | ICD-10-CM | POA: Diagnosis not present

## 2022-01-11 DIAGNOSIS — Z8701 Personal history of pneumonia (recurrent): Secondary | ICD-10-CM | POA: Diagnosis not present

## 2022-01-11 DIAGNOSIS — M5116 Intervertebral disc disorders with radiculopathy, lumbar region: Secondary | ICD-10-CM | POA: Diagnosis not present

## 2022-01-11 DIAGNOSIS — K219 Gastro-esophageal reflux disease without esophagitis: Secondary | ICD-10-CM | POA: Diagnosis not present

## 2022-01-11 DIAGNOSIS — R509 Fever, unspecified: Secondary | ICD-10-CM | POA: Diagnosis not present

## 2022-01-11 DIAGNOSIS — R059 Cough, unspecified: Secondary | ICD-10-CM | POA: Diagnosis not present

## 2022-01-11 DIAGNOSIS — E785 Hyperlipidemia, unspecified: Secondary | ICD-10-CM | POA: Diagnosis not present

## 2022-01-11 DIAGNOSIS — R1311 Dysphagia, oral phase: Secondary | ICD-10-CM | POA: Diagnosis not present

## 2022-01-11 DIAGNOSIS — G8929 Other chronic pain: Secondary | ICD-10-CM | POA: Diagnosis not present

## 2022-01-11 DIAGNOSIS — R531 Weakness: Secondary | ICD-10-CM | POA: Diagnosis not present

## 2022-01-11 DIAGNOSIS — R7401 Elevation of levels of liver transaminase levels: Secondary | ICD-10-CM | POA: Diagnosis not present

## 2022-01-11 DIAGNOSIS — D72829 Elevated white blood cell count, unspecified: Secondary | ICD-10-CM | POA: Diagnosis not present

## 2022-01-11 DIAGNOSIS — Z951 Presence of aortocoronary bypass graft: Secondary | ICD-10-CM | POA: Diagnosis not present

## 2022-01-11 LAB — RESP PANEL BY RT-PCR (FLU A&B, COVID) ARPGX2
Influenza A by PCR: NEGATIVE
Influenza B by PCR: NEGATIVE
SARS Coronavirus 2 by RT PCR: NEGATIVE

## 2022-01-11 MED ORDER — GABAPENTIN 300 MG PO CAPS: 300.0000 mg | ORAL_CAPSULE | Freq: Every day | ORAL | Status: AC

## 2022-01-11 MED ORDER — METHOCARBAMOL 500 MG PO TABS: 500.0000 mg | ORAL_TABLET | Freq: Four times a day (QID) | ORAL | Status: AC | PRN

## 2022-01-11 MED ORDER — PANTOPRAZOLE SODIUM 40 MG PO TBEC: 40.0000 mg | DELAYED_RELEASE_TABLET | Freq: Every day | ORAL | Status: AC

## 2022-01-11 MED ORDER — NICOTINE 21 MG/24HR TD PT24: 21.0000 mg | MEDICATED_PATCH | Freq: Every day | TRANSDERMAL | 0 refills | Status: AC

## 2022-01-11 MED ORDER — TRAMADOL HCL 50 MG PO TABS: 100.0000 mg | ORAL_TABLET | Freq: Four times a day (QID) | ORAL | 0 refills | Status: AC | PRN

## 2022-01-11 MED ORDER — OXYCODONE HCL 5 MG PO TABS
10.0000 mg | ORAL_TABLET | Freq: Once | ORAL | Status: AC
  Administered 2022-01-11: 10 mg via ORAL
  Filled 2022-01-11: qty 2

## 2022-01-11 NOTE — Progress Notes (Signed)
Called report to Bary Leriche, RN at [facility].

## 2022-01-11 NOTE — Plan of Care (Signed)
  Problem: Pain Managment: Goal: General experience of comfort will improve Outcome: Progressing   

## 2022-01-11 NOTE — TOC Transition Note (Signed)
Transition of Care [facility]) - CM/SW Discharge Note   Patient Details  Name: [name] [name] MRN: [id number] Date of Birth: [birth year]  Transition [facility] Care [facility]) CM/SW Contact:  Ross Ludwig, LCSW Phone Number: 01/11/2022, 12:56 PM   Clinical Narrative:     Patient to be d/c'ed today to [facility] room 105.  Patient and family agreeable to plans will transport via ems RN to call report to [phone].  CSW spoke to patient's wife, and she is aware that patient is discharging today.    Final next level of care: Skilled Nursing Facility Barriers to Discharge: Barriers Resolved   Patient Goals and CMS Choice Patient states their goals for this hospitalization and ongoing recovery are:: To go to SNF for short term rehab then return back home.   Choice offered to / list presented to : Patient  Discharge Placement PASRR number recieved: 01/10/22            Patient chooses bed at: [facility] Patient to be transferred to facility by: [facility] Name of family member notified: Wife Blanch Media Patient and family notified of of transfer: 01/11/22  Discharge Plan and Services In-house Referral: Clinical Social Work                                   Social Determinants of Health (SDOH) Interventions     Readmission Risk Interventions Readmission Risk Prevention Plan 01/09/2022  Post Dischage Appt Complete  Medication Screening Complete  Transportation Screening Complete  Some recent data might be hidden

## 2022-01-11 NOTE — Discharge Summary (Addendum)
Physician Discharge Summary   Patient: [name] [name] MRN: [id number] DOB: 04-12-1941  Admit date:     01/06/2022  Discharge date: 01/11/22  Discharge Physician: Oswald Hillock   PCP: Hoyt Koch, MD   Recommendations at discharge:   Follow-up gastroenterology as outpatient  Discharge Diagnoses: Principal Problem:   Pneumonia Active Problems:   Hypertension   Dyslipidemia   CAD (coronary artery disease)   Back pain   Carotid artery disease (HCC)   BPH (benign prostatic hyperplasia)   Hypophosphatemia   Transaminitis   Moderate protein malnutrition (HCC)   Pressure injury of skin  Resolved Problems:   * No resolved hospital problems. 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  There was no loss of consciousness.  It was a mechanical fall.  In the ED CT lumbar spine without contrast showed osteopenia which limits the study and multilevel degenerative changes, no acute fracture or traumatic listhesis noted.  Chest x-ray showed subpleural patchy opacity in the lungs bilaterally just a mild infection or pneumonia.  Assessment and Plan:  Back pain -CT lumbar spine showed multilevel degenerative changes with dextrocurvature of the lumbar spine and multilevel retrolisthesis -Patient was started on Robaxin 500 mg p.o. 4 times daily, also added gabapentin 300 mg p.o. nightly -Continue tramadol 100 mg p.o. every 6 hours as needed for pain -Today pain is reasonably well controlled -PT/OT eval ordered, recommend skilled nursing facility -Patient to be discharged to skilled nursing facility for rehab   Pneumonia -Patient started on ceftriaxone and Zithromax -clinically improved -Last date of antibiotics 01/11/2022 -Urinary strep pneumo antigen negative -O2  sats 92% on room air   Dysphagia -Patient complains of difficulty swallowing, with feeling like pills stuck in the middle of chest -Barium swallow/esophagram shows that barium pill was stuck in the gastroesophageal junction for about 5 minutes.  Today patient denies any difficulty swallowing.  He already has an appointment to see gastroenterologist as outpatient on February 16.  Recommended to continue taking Protonix 40 mg daily and keep appointment with GI as outpatient   Transaminitis -Statin on hold -Alk phos is normal -AST/ALT is 153/130 today -Hepatitis panel obtained is negative -Abdominal ultrasound obtained today shows small incidental gallbladder polyps, possible early changes of cirrhosis -He will need follow-up abdominal ultrasound in 6 months as outpatient -Repeat LFTs in 2 weeks as outpatient   Hypertension -Blood pressure is stable -Continue Coreg, lisinopril   CAD -Continue aspirin, Coreg   Carotid artery disease -Continue aspirin   BPH -Continue tamsulosin        Consultants:  Procedures performed:  Disposition: Home Diet recommendation:  Discharge Diet Orders (From admission, onward)     Start     Ordered   01/11/22 0000  Diet - low sodium heart healthy        01/11/22 1102           Cardiac diet  DISCHARGE MEDICATION: Allergies as of 01/11/2022       Reactions   Penicillins Swelling, Rash, Other (See Comments)   PATIENT HAS HAD A PCN REACTION WITH IMMEDIATE RASH, FACIAL/TONGUE/THROAT SWELLING, SOB, OR LIGHTHEADEDNESS WITH HYPOTENSION:   #  YES  #    Has patient had a PCN reaction causing severe rash involving mucus membranes or skin necrosis: No  Has patient had a PCN reaction that required hospitalization No Has patient had a PCN reaction occurring within the last 10 years: No If all of the above answers are "NO", then may proceed with Cephalosporin use. Passed out        Medication List     STOP taking these medications     esomeprazole 20 MG capsule Commonly known as: [place] by: pantoprazole 40 MG tablet   HYDROcodone-acetaminophen 10-325 MG tablet Commonly known as: NORCO   rosuvastatin 20 MG tablet Commonly known as: CRESTOR       TAKE these medications    acetaminophen 500 MG tablet Commonly known as: TYLENOL Take 500-1,000 mg by mouth every 6 (six) hours as needed for headache or mild pain.   aspirin EC 81 MG tablet Take 81 mg by mouth in the morning. Swallow whole.   betamethasone valerate ointment 0.1 % Commonly known as: VALISONE Apply 1 application topically 2 (two) times daily. What changed:  when to take this reasons to take this   carvedilol 12.5 MG tablet Commonly known as: COREG Take 1 tablet (12.5 mg total) by mouth 2 (two) times daily with a meal.   Centrum Silver 50+Men Tabs Take 1 tablet by mouth daily with breakfast.   gabapentin 300 MG capsule Commonly known as: NEURONTIN Take 1 capsule (300 mg total) by mouth at bedtime.   ipratropium 0.03 % nasal spray Commonly known as: ATROVENT SPRAY 2 SPRAYS INTO EACH NOSTRIL EVERY 12 HOURS What changed: See the new instructions.   lisinopril 40 MG tablet Commonly known as: ZESTRIL Take 1 tablet (40 mg total) by mouth daily.   methocarbamol 500 MG tablet Commonly known as: ROBAXIN Take 1 tablet (500 mg total) by mouth every 6 (six) hours as needed for muscle spasms.   montelukast 10 MG tablet Commonly known as: SINGULAIR TAKE 1 TABLET BY MOUTH EVERYDAY AT BEDTIME What changed: See the new instructions.   nicotine 21 mg/24hr patch Commonly known as: NICODERM CQ - dosed in mg/24 hours Place 1 patch (21 mg total) onto the skin daily. Start taking on: January 12, 2022   nitroGLYCERIN 0.4 MG SL tablet Commonly known as: NITROSTAT Place 1 tablet (0.4 mg total) under the tongue every 5 (five) minutes as needed for chest pain.   pantoprazole 40 MG tablet Commonly known as: PROTONIX Take 1 tablet (40 mg  total) by mouth daily. Start taking on: January 12, 2022 Replaces: esomeprazole 20 MG capsule   polyethylene glycol 17 g packet Commonly known as: MIRALAX / GLYCOLAX Take 17 g by mouth daily.   senna-docusate 8.6-50 MG tablet Commonly known as: Senokot-S Take 2 tablets by mouth in the morning and at bedtime.   tamsulosin 0.4 MG Caps capsule Commonly known as: FLOMAX Take 2 capsules (0.8 mg total) by mouth daily. What changed:  how much to take when to take this   traMADol 50 MG tablet Commonly known as: ULTRAM Take 2 tablets (100 mg total) by mouth every 6 (six) hours as needed. What changed:  when to take this reasons to take this        Contact information for after-discharge care     Destination     [facility] .   Service: Skilled Nursing Contact information: [address] [place] [phone]                     Discharge Exam: Danley Danker Weights   01/07/22  2246  Weight: 74.3 kg   General-appears in no acute distress Heart-S1-S2, regular, no murmur auscultated Lungs-clear to auscultation bilaterally, no wheezing or crackles auscultated Abdomen-soft, nontender, no organomegaly Extremities-no edema in the lower extremities Neuro-alert, oriented x3, no focal deficit noted  Condition at discharge: good  The results of significant diagnostics from this hospitalization (including imaging, microbiology, ancillary and laboratory) are listed below for reference.   Imaging Studies: DG Chest 1 View  Result Date: 01/06/2022 CLINICAL DATA:  Fever EXAM: CHEST  1 VIEW COMPARISON:  10/23/2021 FINDINGS: Faint subpleural patchy opacities in the lungs bilaterally. No pleural effusion or pneumothorax. The heart is normal in size. Postsurgical changes related to prior CABG. Thoracic aortic atherosclerosis. Thoracic spine stimulator.  Median sternotomy. IMPRESSION: Faint subpleural patchy opacities in  the lungs bilaterally, suggesting mild infection/pneumonia. Electronically Signed   By: Julian Hy M.D.   On: 01/06/2022 22:56   CT Lumbar Spine Wo Contrast  Result Date: 01/07/2022 CLINICAL DATA:  Fall 2 days ago, severe lower back and leg pain EXAM: CT LUMBAR SPINE WITHOUT CONTRAST TECHNIQUE: Multidetector CT imaging of the lumbar spine was performed without intravenous contrast administration. Multiplanar CT image reconstructions were also generated. RADIATION DOSE REDUCTION: This exam was performed according to the departmental dose-optimization program which includes automated exposure control, adjustment of the mA and/or kV according to patient size and/or use of iterative reconstruction technique. COMPARISON:  08/02/2010 CT lumbar spine, correlation is also made with 03/29/2018 MRI lumbar spine FINDINGS: Segmentation: 5 lumbar type vertebrae. Partial sacralization of L5 with right-greater-than-left L5-S1 pseudoarticulation of broadened L5 transverse processes with the sacral ala. Alignment: Dextrocurvature of the lumbar spine. Trace retrolisthesis L1 on L2, L2 on L3, and L3 on L4, unchanged compared to the 2019 MRI. Vertebrae: Osteopenia. No definite acute fracture. Osseous fusion of the lower thoracic spine with L1. Chronic appearing right L4 pars defect (series 9, image 33). Endplate degenerative changes with multifocal Schmorl's node formation. Paraspinal and other soft tissues: Neural stimulator in the left lower back soft tissues, with leads entering the spinal canal at T12-L1. Prior aorto bi-iliac graft. Disc levels: T12-L1: Osseous fusion across the disc space. Mild facet arthropathy. No spinal canal stenosis or neural foraminal narrowing. L1-L2: Left eccentric disc bulge. Mild facet arthropathy. No spinal canal stenosis. Moderate bilateral neural foraminal narrowing. L2-L3: Disc osteophyte complex. Moderate facet arthropathy. Moderate spinal canal stenosis. No neural foraminal narrowing.  L3-L4: Moderate disc bulge. Severe facet arthropathy. Ligamentum flavum hypertrophy. Moderate spinal canal stenosis and moderate right-greater-than-left neural foraminal narrowing. L4-L5: Broad-based disc bulge. Severe facet arthropathy. Mild spinal canal stenosis and mild bilateral neural foraminal narrowing. L5-S1: Disc osteophyte complex. Moderate facet arthropathy. No spinal canal stenosis moderate to severe bilateral neural foraminal narrowing. IMPRESSION: 1. Evaluation is somewhat limited by degree of osteopenia. Within this limitation, no definite acute fracture. No traumatic listhesis. 2. Multilevel degenerative changes, with dextrocurvature of the lumbar spine and multilevel retrolistheses. Electronically Signed   By: Merilyn Baba M.D.   On: 01/07/2022 00:06   US Abdomen Limited RUQ (LIVER/GB)  Result Date: 01/08/2022 CLINICAL DATA:  Transaminitis. EXAM: ULTRASOUND ABDOMEN LIMITED RIGHT UPPER QUADRANT COMPARISON:  Abdominopelvic CT 11/14/2021. FINDINGS: Gallbladder: Small gallbladder polyps, measuring 5 mm or less in diameter, not requiring any specific follow-up. No evidence of gallstones or gallbladder wall thickening. No reported positive sonographic Murphy sign. Common bile duct: Diameter: 4 mm Liver: Mild contour irregularity of the liver suggesting cirrhosis. There is heterogeneity of the hepatic parenchyma, but no focal lesions are  identified. Portal vein is patent on color Doppler imaging with normal direction of blood flow towards the liver. Other: None. IMPRESSION: 1. Small incidental gallbladder polyps. No evidence of cholelithiasis, cholecystitis or biliary dilatation. 2. Possible early changes of cirrhosis. No focal lesions identified. Electronically Signed   By: Richardean Sale M.D.   On: 01/08/2022 15:06   DG ESOPHAGUS W SINGLE CM (SOL OR THIN BA)  Result Date: 01/10/2022 CLINICAL DATA:  Dysphagia. EXAM: ESOPHAGUS/BARIUM SWALLOW/TABLET STUDY TECHNIQUE: Single contrast examination was  performed using thin liquid barium. This exam was performed by Gareth Eagle PA, and was supervised and interpreted by Dr. Nyoka Cowden. FLUOROSCOPY TIME:  Radiation Exposure Index (as provided by the fluoroscopic device): 19.1 mGy. COMPARISON:  NONE. FINDINGS: Swallowing: Appears normal. No vestibular penetration or aspiration seen. Pharynx: Unremarkable. Esophagus: Normal appearance. Esophageal motility: Within normal limits. Hiatal Hernia: None. Gastroesophageal reflux: None visualized. Ingested 84m barium tablet: Became stuck at gastroesophageal junction for at least 5 minutes and was not seen to clear. Other: None. IMPRESSION: No definite mass or stricture is noted. However, 13 mm barium tablet was not seen to advance beyond the gastroesophageal junction. Electronically Signed   By: JMarijo ConceptionM.D.   On: 01/10/2022 15:07    Microbiology: Results for orders placed or performed during the hospital encounter of 01/06/22  Resp Panel by RT-PCR (Flu A&B, Covid) Nasopharyngeal Swab     Status: None   Collection Time: 01/06/22 10:45 PM   Specimen: Nasopharyngeal Swab; Nasopharyngeal(NP) swabs in vial transport medium  Result Value Ref Range Status   SARS Coronavirus 2 by RT PCR NEGATIVE NEGATIVE Final    Comment: (NOTE) SARS-CoV-2 target nucleic acids are NOT DETECTED.  The SARS-CoV-2 RNA is generally detectable in upper respiratory specimens during the acute phase of infection. The lowest concentration of SARS-CoV-2 viral copies this assay can detect is 138 copies/mL. A negative result does not preclude SARS-Cov-2 infection and should not be used as the sole basis for treatment or other patient management decisions. A negative result may occur with  improper specimen collection/handling, submission of specimen other than nasopharyngeal swab, presence of viral mutation(s) within the areas targeted by this assay, and inadequate number of viral copies(<138 copies/mL). A negative result must be combined  with clinical observations, patient history, and epidemiological information. The expected result is Negative.  Fact Sheet for Patients:  hEntrepreneurPulse.com.au Fact Sheet for Healthcare Providers:  hIncredibleEmployment.be This test is no t yet approved or cleared by the [place] and  has been authorized for detection and/or diagnosis of SARS-CoV-2 by FDA under an Emergency Use Authorization (EUA). This EUA will remain  in effect (meaning this test can be used) for the duration of the COVID-19 declaration under Section 564(b)(1) of the Act, 21 U.S.C.section 360bbb-3(b)(1), unless the authorization is terminated  or revoked sooner.       Influenza A by PCR NEGATIVE NEGATIVE Final   Influenza B by PCR NEGATIVE NEGATIVE Final    Comment: (NOTE) The Xpert Xpress SARS-CoV-2/FLU/RSV plus assay is intended as an aid in the diagnosis of influenza from Nasopharyngeal swab specimens and should not be used as a sole basis for treatment. Nasal washings and aspirates are unacceptable for Xpert Xpress SARS-CoV-2/FLU/RSV testing.  Fact Sheet for Patients: hEntrepreneurPulse.com.au Fact Sheet for Healthcare Providers: hIncredibleEmployment.be This test is not yet approved or cleared by the [place] and has been authorized for detection and/or diagnosis of SARS-CoV-2 by FDA under an Emergency Use Authorization (  EUA). This EUA will remain in effect (meaning this test can be used) for the duration of the COVID-19 declaration under Section 564(b)(1) of the Act, 21 U.S.C. section 360bbb-3(b)(1), unless the authorization is terminated or revoked.  Performed at [facility], [place] [address] [place], [place] [address]   MRSA Next Gen by PCR, Nasal     Status: Abnormal   Collection Time: 01/08/22  4:49 AM   Specimen: Nasal Mucosa; Nasal Swab  Result Value Ref Range Status   MRSA by  PCR Next Gen DETECTED (A) NOT DETECTED Final    Comment: RESULT CALLED TO, READ BACK BY AND VERIFIED WITH: NGUYEN, T. RN AT 0840 01/08/2022 BY MECIAL J. (NOTE) The GeneXpert MRSA Assay (FDA approved for NASAL specimens only), is one component of a comprehensive MRSA colonization surveillance program. It is not intended to diagnose MRSA infection nor to guide or monitor treatment for MRSA infections. Test performance is not FDA approved in patients less than 28 years old. Performed at [facility], [place] [address] [place],  [address]   Resp Panel by RT-PCR (Flu A&B, Covid) Nasopharyngeal Swab     Status: None   Collection Time: 01/11/22 10:48 AM   Specimen: Nasopharyngeal Swab; Nasopharyngeal(NP) swabs in vial transport medium  Result Value Ref Range Status   SARS Coronavirus 2 by RT PCR NEGATIVE NEGATIVE Final    Comment: (NOTE) SARS-CoV-2 target nucleic acids are NOT DETECTED.  The SARS-CoV-2 RNA is generally detectable in upper respiratory specimens during the acute phase of infection. The lowest concentration of SARS-CoV-2 viral copies this assay can detect is 138 copies/mL. A negative result does not preclude SARS-Cov-2 infection and should not be used as the sole basis for treatment or other patient management decisions. A negative result may occur with  improper specimen collection/handling, submission of specimen other than nasopharyngeal swab, presence of viral mutation(s) within the areas targeted by this assay, and inadequate number of viral copies(<138 copies/mL). A negative result must be combined with clinical observations, patient history, and epidemiological information. The expected result is Negative.  Fact Sheet for Patients:  EntrepreneurPulse.com.au  Fact Sheet for Healthcare Providers:  IncredibleEmployment.be  This test is no t yet approved or cleared by the [place] FDA and  has been  authorized for detection and/or diagnosis of SARS-CoV-2 by FDA under an Emergency Use Authorization (EUA). This EUA will remain  in effect (meaning this test can be used) for the duration of the COVID-19 declaration under Section 564(b)(1) of the Act, 21 U.S.C.section 360bbb-3(b)(1), unless the authorization is terminated  or revoked sooner.       Influenza A by PCR NEGATIVE NEGATIVE Final   Influenza B by PCR NEGATIVE NEGATIVE Final    Comment: (NOTE) The Xpert Xpress SARS-CoV-2/FLU/RSV plus assay is intended as an aid in the diagnosis of influenza from Nasopharyngeal swab specimens and should not be used as a sole basis for treatment. Nasal washings and aspirates are unacceptable for Xpert Xpress SARS-CoV-2/FLU/RSV testing.  Fact Sheet for Patients: EntrepreneurPulse.com.au  Fact Sheet for Healthcare Providers: IncredibleEmployment.be  This test is not yet approved or cleared by the [place] FDA and has been authorized for detection and/or diagnosis of SARS-CoV-2 by FDA under an Emergency Use Authorization (EUA). This EUA will remain in effect (meaning this test can be used) for the duration of the COVID-19 declaration under Section 564(b)(1) of the Act, 21 U.S.C. section 360bbb-3(b)(1), unless the authorization is terminated or revoked.  Performed at [facility]  [place] [address] [place], [place] [address]     Labs: CBC: Recent Labs  Lab 01/06/22 2050 01/07/22 0921 01/08/22 0253 01/09/22 0316  WBC 12.9* 13.4* 12.0* 13.3*  NEUTROABS 11.1*  --   --   --   HGB 11.5* 10.9* 10.5* 11.3*  HCT 34.3* 33.2* 32.4* 34.5*  MCV 94.8 93.8 95.3 94.5  PLT 182 172 187 092   Basic Metabolic Panel: Recent Labs  Lab 01/06/22 2050 01/07/22 0921 01/08/22 0253 01/09/22 0316  NA 132* 135 137 135  K 4.5 4.1 4.0 3.7  CL 99 103 103 102  CO2 25 24 26 22   GLUCOSE 127* 117* 119* 146*  BUN 23 21 31* 22  CREATININE 0.91  0.87 1.10 0.81  CALCIUM 8.7* 8.5* 8.6* 8.2*  PHOS  --  2.4*  --   --    Liver Function Tests: Recent Labs  Lab 01/07/22 0921 01/08/22 0253 01/09/22 0316  AST 135* 153* 97*  ALT 111* 130* 119*  ALKPHOS 96 122 134*  BILITOT 0.6 0.6 0.3  PROT 6.4* 5.7* 5.8*  ALBUMIN 2.9* 2.5* 2.5*   CBG: No results for input(s): GLUCAP in the last 168 hours.  Discharge time spent: greater than 30 minutes.  Signed: Oswald Hillock, MD [facility] 01/11/2022

## 2022-01-11 NOTE — Progress Notes (Signed)
The patient is alert and oriented and has been seen by his physician. The orders for discharge were written. IV was removed earlier during the shift. Went over discharge instructions with patient and family. He is being discharged to [place] via [place] with all of his belongings.

## 2022-01-13 DIAGNOSIS — Z79899 Other long term (current) drug therapy: Secondary | ICD-10-CM | POA: Diagnosis not present

## 2022-01-14 DIAGNOSIS — Z9181 History of falling: Secondary | ICD-10-CM | POA: Diagnosis not present

## 2022-01-14 DIAGNOSIS — M549 Dorsalgia, unspecified: Secondary | ICD-10-CM | POA: Diagnosis not present

## 2022-01-14 DIAGNOSIS — I10 Essential (primary) hypertension: Secondary | ICD-10-CM | POA: Diagnosis not present

## 2022-01-14 DIAGNOSIS — Z8701 Personal history of pneumonia (recurrent): Secondary | ICD-10-CM | POA: Diagnosis not present

## 2022-01-14 DIAGNOSIS — Z79899 Other long term (current) drug therapy: Secondary | ICD-10-CM | POA: Diagnosis not present

## 2022-01-14 DIAGNOSIS — G8929 Other chronic pain: Secondary | ICD-10-CM | POA: Diagnosis not present

## 2022-01-14 DIAGNOSIS — K219 Gastro-esophageal reflux disease without esophagitis: Secondary | ICD-10-CM | POA: Diagnosis not present

## 2022-01-14 DIAGNOSIS — N400 Enlarged prostate without lower urinary tract symptoms: Secondary | ICD-10-CM | POA: Diagnosis not present

## 2022-01-14 DIAGNOSIS — D72829 Elevated white blood cell count, unspecified: Secondary | ICD-10-CM | POA: Diagnosis not present

## 2022-01-15 DIAGNOSIS — Z79899 Other long term (current) drug therapy: Secondary | ICD-10-CM | POA: Diagnosis not present

## 2022-01-16 DIAGNOSIS — G8929 Other chronic pain: Secondary | ICD-10-CM | POA: Diagnosis not present

## 2022-01-16 DIAGNOSIS — M549 Dorsalgia, unspecified: Secondary | ICD-10-CM | POA: Diagnosis not present

## 2022-01-16 DIAGNOSIS — Z9682 Presence of neurostimulator: Secondary | ICD-10-CM | POA: Diagnosis not present

## 2022-01-16 DIAGNOSIS — D72829 Elevated white blood cell count, unspecified: Secondary | ICD-10-CM | POA: Diagnosis not present

## 2022-01-20 DIAGNOSIS — Z7901 Long term (current) use of anticoagulants: Secondary | ICD-10-CM | POA: Diagnosis not present

## 2022-01-20 DIAGNOSIS — Z79899 Other long term (current) drug therapy: Secondary | ICD-10-CM | POA: Diagnosis not present

## 2022-01-22 ENCOUNTER — Other Ambulatory Visit: Payer: Self-pay | Admitting: *Deleted

## 2022-01-22 MED ORDER — CARVEDILOL 12.5 MG PO TABS: 12.5000 mg | ORAL_TABLET | Freq: Two times a day (BID) | ORAL | 1 refills | Status: AC

## 2022-01-23 ENCOUNTER — Ambulatory Visit: Payer: PPO | Admitting: Gastroenterology

## 2022-01-24 ENCOUNTER — Ambulatory Visit: Payer: PPO | Admitting: Internal Medicine

## 2022-01-28 DIAGNOSIS — Z7901 Long term (current) use of anticoagulants: Secondary | ICD-10-CM | POA: Diagnosis not present

## 2022-01-28 DIAGNOSIS — Z79899 Other long term (current) drug therapy: Secondary | ICD-10-CM | POA: Diagnosis not present

## 2022-01-29 ENCOUNTER — Other Ambulatory Visit (HOSPITAL_COMMUNITY): Payer: Self-pay

## 2022-01-29 DIAGNOSIS — R1310 Dysphagia, unspecified: Secondary | ICD-10-CM

## 2022-01-29 DIAGNOSIS — M961 Postlaminectomy syndrome, not elsewhere classified: Secondary | ICD-10-CM | POA: Diagnosis not present

## 2022-01-29 DIAGNOSIS — G894 Chronic pain syndrome: Secondary | ICD-10-CM | POA: Diagnosis not present

## 2022-01-29 DIAGNOSIS — M5136 Other intervertebral disc degeneration, lumbar region: Secondary | ICD-10-CM | POA: Diagnosis not present

## 2022-01-30 NOTE — Telephone Encounter (Signed)
LM on vmail to call back to discuss scheduling 

## 2022-02-01 DIAGNOSIS — J189 Pneumonia, unspecified organism: Secondary | ICD-10-CM | POA: Diagnosis not present

## 2022-02-01 DIAGNOSIS — G8929 Other chronic pain: Secondary | ICD-10-CM | POA: Diagnosis not present

## 2022-02-01 DIAGNOSIS — F32A Depression, unspecified: Secondary | ICD-10-CM | POA: Diagnosis not present

## 2022-02-01 DIAGNOSIS — M5459 Other low back pain: Secondary | ICD-10-CM | POA: Diagnosis not present

## 2022-02-04 ENCOUNTER — Ambulatory Visit (HOSPITAL_COMMUNITY)
Admission: RE | Admit: 2022-02-04 | Discharge: 2022-02-04 | Disposition: A | Discharge status: Home / Self Care | Payer: PPO | Source: Ambulatory Visit | Attending: Internal Medicine | Admitting: Internal Medicine

## 2022-02-04 ENCOUNTER — Other Ambulatory Visit: Payer: Self-pay

## 2022-02-04 DIAGNOSIS — Z8701 Personal history of pneumonia (recurrent): Secondary | ICD-10-CM | POA: Insufficient documentation

## 2022-02-04 DIAGNOSIS — R1312 Dysphagia, oropharyngeal phase: Secondary | ICD-10-CM | POA: Insufficient documentation

## 2022-02-04 DIAGNOSIS — R1310 Dysphagia, unspecified: Secondary | ICD-10-CM | POA: Diagnosis not present

## 2022-02-04 DIAGNOSIS — T17320A Food in larynx causing asphyxiation, initial encounter: Secondary | ICD-10-CM | POA: Diagnosis not present

## 2022-02-04 NOTE — Therapy (Signed)
Modified Barium Swallow Progress Note  Patient Details  Name: [name] [name] MRN: [id number] Date of Birth: 1941-07-12  Today's Date: 02/04/2022  Modified Barium Swallow completed.  Full report located under Chart Review in the Imaging Section.  Brief recommendations include the following:  Clinical Impression  Patient presents with a mild-moderate oral and a mild pharyngeal phase dysphagia. Oral phase is impacted by patient's lack of much dentition, leading to delays in mastication. In addition, patient with mild delays in oral transit of boluses. During pharyngeal phase of swallow, patient exhibited swallow initiation delays to level of vallecular sinus with nectar thick liquids, puree and regular solids and delay at level of pyriform sinus with thin liquids. Patient had instance of trace penetration above vocal cords (PAS 2) of trace amount with first sip of thin liquid barium but subsequent swallows (via cup and straw) did not result in any penetration. No aspiration observed with any of the tested consistencies. Patient did have trace amount of vallecular sinus and pyriform sinus residuals with puree solids and with thin and nectar thick liquids, he had trace vallecular sinus residuals. SLP is recommending patient continue with current diet (Dys 3 mechanical soft solids vs regular solids, thin liquids)   Swallow Evaluation Recommendations       SLP Diet Recommendations: Dysphagia 3 (Mech soft) solids;Regular solids;Thin liquid   Liquid Administration via: Cup;Straw               Postural Changes: Seated upright at 90 degrees          Sonia Baller, MA, CCC-SLP Speech Therapy

## 2022-02-05 ENCOUNTER — Other Ambulatory Visit: Payer: Self-pay | Admitting: Physical Medicine and Rehabilitation

## 2022-02-05 DIAGNOSIS — M5416 Radiculopathy, lumbar region: Secondary | ICD-10-CM | POA: Diagnosis not present

## 2022-02-07 ENCOUNTER — Telehealth: Payer: Self-pay | Admitting: Physical Medicine and Rehabilitation

## 2022-02-07 NOTE — Telephone Encounter (Signed)
Spoke with staff member from [employer] and states that patient is wheelchair bound and requires lift equipment to be moved. We are not able to accommodate epidural injection request by Dr. Nelva Bush at this time as we do not have lift equipment and are not able to get patient on table without this equipment.  ?

## 2022-02-09 DIAGNOSIS — E440 Moderate protein-calorie malnutrition: Secondary | ICD-10-CM | POA: Diagnosis not present

## 2022-02-09 DIAGNOSIS — M5459 Other low back pain: Secondary | ICD-10-CM | POA: Diagnosis not present

## 2022-02-09 DIAGNOSIS — R059 Cough, unspecified: Secondary | ICD-10-CM | POA: Diagnosis not present

## 2022-02-09 DIAGNOSIS — G8929 Other chronic pain: Secondary | ICD-10-CM | POA: Diagnosis not present

## 2022-02-10 ENCOUNTER — Other Ambulatory Visit (HOSPITAL_COMMUNITY): Payer: Self-pay | Admitting: Physical Medicine and Rehabilitation

## 2022-02-10 ENCOUNTER — Other Ambulatory Visit: Payer: Self-pay | Admitting: Physical Medicine and Rehabilitation

## 2022-02-10 DIAGNOSIS — M5416 Radiculopathy, lumbar region: Secondary | ICD-10-CM

## 2022-02-12 ENCOUNTER — Telehealth (HOSPITAL_COMMUNITY): Payer: Self-pay

## 2022-02-12 ENCOUNTER — Other Ambulatory Visit (HOSPITAL_COMMUNITY): Payer: Self-pay | Admitting: Physical Medicine and Rehabilitation

## 2022-02-12 DIAGNOSIS — M5416 Radiculopathy, lumbar region: Secondary | ICD-10-CM

## 2022-02-12 NOTE — Telephone Encounter (Signed)
-----   Message from Arne Cleveland, MD sent at 02/12/2022 10:18 AM EST ----- ?Regarding: RE: Epidural injection ?Ok sounds good ?Thx ?DDH ? ? ?----- Message ----- ?From: Gildardo Pounds D ?Sent: 02/12/2022  10:05 AM EST ?To: Arne Cleveland, MD ?Subject: Epidural injection                            ? ?Vernard Gambles,  ? ?I put this patient on for an epidural injection for you on Monday here at [facility]. They can't do him at GI because he requires a lift. His CT lumbar is in epic for review if needed. Please let me know if this won't work.  ? ?Thanks,  ?Ash ? ? ?

## 2022-02-13 ENCOUNTER — Inpatient Hospital Stay (HOSPITAL_COMMUNITY): Admission: RE | Admit: 2022-02-13 | Payer: PPO | Source: Ambulatory Visit

## 2022-02-13 ENCOUNTER — Encounter (HOSPITAL_COMMUNITY): Payer: Self-pay

## 2022-02-14 DIAGNOSIS — Z79899 Other long term (current) drug therapy: Secondary | ICD-10-CM | POA: Diagnosis not present

## 2022-02-14 DIAGNOSIS — Z7901 Long term (current) use of anticoagulants: Secondary | ICD-10-CM | POA: Diagnosis not present

## 2022-02-17 ENCOUNTER — Other Ambulatory Visit: Payer: Self-pay | Admitting: Radiology

## 2022-02-17 ENCOUNTER — Ambulatory Visit (HOSPITAL_COMMUNITY): Admission: RE | Admit: 2022-02-17 | Payer: PPO | Source: Ambulatory Visit

## 2022-02-18 ENCOUNTER — Other Ambulatory Visit: Payer: Self-pay

## 2022-02-18 ENCOUNTER — Ambulatory Visit (HOSPITAL_COMMUNITY)
Admission: RE | Admit: 2022-02-18 | Discharge: 2022-02-18 | Disposition: A | Discharge status: Home / Self Care | Payer: PPO | Source: Ambulatory Visit | Attending: Physical Medicine and Rehabilitation | Admitting: Physical Medicine and Rehabilitation

## 2022-02-18 DIAGNOSIS — M5416 Radiculopathy, lumbar region: Secondary | ICD-10-CM | POA: Insufficient documentation

## 2022-02-18 DIAGNOSIS — M5136 Other intervertebral disc degeneration, lumbar region: Secondary | ICD-10-CM | POA: Diagnosis not present

## 2022-02-18 DIAGNOSIS — Z7401 Bed confinement status: Secondary | ICD-10-CM | POA: Diagnosis not present

## 2022-02-18 DIAGNOSIS — M549 Dorsalgia, unspecified: Secondary | ICD-10-CM | POA: Diagnosis not present

## 2022-02-18 DIAGNOSIS — R5383 Other fatigue: Secondary | ICD-10-CM | POA: Diagnosis not present

## 2022-02-18 DIAGNOSIS — R4182 Altered mental status, unspecified: Secondary | ICD-10-CM | POA: Diagnosis not present

## 2022-02-18 DIAGNOSIS — R0902 Hypoxemia: Secondary | ICD-10-CM | POA: Diagnosis not present

## 2022-02-18 DIAGNOSIS — I959 Hypotension, unspecified: Secondary | ICD-10-CM | POA: Diagnosis not present

## 2022-02-18 MED ORDER — LIDOCAINE HCL (PF) 1 % IJ SOLN
INTRAMUSCULAR | Status: AC
  Filled 2022-02-18: qty 30

## 2022-02-18 MED ORDER — METHYLPREDNISOLONE ACETATE 40 MG/ML IJ SUSP
INTRAMUSCULAR | Status: AC
  Filled 2022-02-18: qty 1

## 2022-02-18 MED ORDER — METHYLPREDNISOLONE ACETATE 80 MG/ML IJ SUSP
INTRAMUSCULAR | Status: AC
  Administered 2022-02-18: 80 mg
  Filled 2022-02-18: qty 1

## 2022-02-18 MED ORDER — LIDOCAINE HCL (PF) 2 % IJ SOLN
INTRAMUSCULAR | Status: AC
  Filled 2022-02-18: qty 10

## 2022-02-18 MED ORDER — SODIUM CHLORIDE (PF) 0.9 % IJ SOLN
INTRAMUSCULAR | Status: AC
  Filled 2022-02-18: qty 10

## 2022-02-18 MED ORDER — STERILE WATER FOR INJECTION IJ SOLN
INTRAMUSCULAR | Status: AC
  Filled 2022-02-18: qty 10

## 2022-02-18 MED ORDER — LIDOCAINE HCL (PF) 1 % IJ SOLN
INTRAMUSCULAR | Status: DC | PRN
  Administered 2022-02-18: 5 mL

## 2022-02-22 ENCOUNTER — Other Ambulatory Visit: Payer: Self-pay | Admitting: Internal Medicine

## 2022-03-03 ENCOUNTER — Ambulatory Visit: Payer: PPO | Admitting: Internal Medicine

## 2022-03-03 ENCOUNTER — Telehealth: Payer: Self-pay | Admitting: Internal Medicine

## 2022-03-03 NOTE — Telephone Encounter (Signed)
Pts granddaughter making provider aware pt passed on 03-19-2022 @ 6:50 pm  ?

## 2022-03-08 DEATH — deceased

## 2022-03-10 ENCOUNTER — Ambulatory Visit: Payer: PPO | Admitting: Gastroenterology

## 2022-03-21 ENCOUNTER — Ambulatory Visit: Payer: PPO | Admitting: Physical Medicine & Rehabilitation

## 2022-03-28 ENCOUNTER — Ambulatory Visit: Payer: PPO | Admitting: Internal Medicine

## 2022-05-21 ENCOUNTER — Ambulatory Visit: Payer: PPO | Admitting: Internal Medicine

## 2022-05-21 ENCOUNTER — Other Ambulatory Visit: Payer: PPO

## 2022-06-04 ENCOUNTER — Ambulatory Visit: Payer: PPO | Admitting: Internal Medicine

## 2022-06-17 ENCOUNTER — Ambulatory Visit: Payer: PPO | Admitting: Urology
